# Patient Record
Sex: Female | Born: 1970
Health system: Southern US, Community
[De-identification: ages and names within clinical notes are randomized; demographics above are authoritative.]

## PROBLEM LIST (undated history)

## (undated) DIAGNOSIS — D649 Anemia, unspecified: Secondary | ICD-10-CM

## (undated) DIAGNOSIS — K219 Gastro-esophageal reflux disease without esophagitis: Secondary | ICD-10-CM

## (undated) DIAGNOSIS — E785 Hyperlipidemia, unspecified: Secondary | ICD-10-CM

## (undated) DIAGNOSIS — C801 Malignant (primary) neoplasm, unspecified: Secondary | ICD-10-CM

## (undated) DIAGNOSIS — N189 Chronic kidney disease, unspecified: Secondary | ICD-10-CM

## (undated) HISTORY — DX: Chronic kidney disease, unspecified: N18.9

## (undated) HISTORY — PX: HERNIA REPAIR: SHX51

## (undated) HISTORY — PX: TUBAL LIGATION: SHX77

## (undated) HISTORY — DX: Morbid (severe) obesity due to excess calories: E66.01

## (undated) HISTORY — DX: Hyperlipidemia, unspecified: E78.5

## (undated) HISTORY — PX: BREAST BIOPSY: SHX20

---

## 1999-11-14 ENCOUNTER — Emergency Department (HOSPITAL_COMMUNITY): Admission: EM | Admit: 1999-11-14 | Discharge: 1999-11-14 | Payer: Self-pay | Admitting: Emergency Medicine

## 1999-12-02 ENCOUNTER — Other Ambulatory Visit: Admission: RE | Admit: 1999-12-02 | Discharge: 1999-12-02 | Payer: Self-pay | Admitting: *Deleted

## 2001-03-09 ENCOUNTER — Emergency Department (HOSPITAL_COMMUNITY): Admission: EM | Admit: 2001-03-09 | Discharge: 2001-03-09 | Payer: Self-pay | Admitting: *Deleted

## 2006-03-02 ENCOUNTER — Encounter: Admission: RE | Admit: 2006-03-02 | Discharge: 2006-03-02 | Payer: Self-pay | Admitting: Family Medicine

## 2012-09-26 ENCOUNTER — Emergency Department (HOSPITAL_COMMUNITY)
Admission: EM | Admit: 2012-09-26 | Discharge: 2012-09-26 | Disposition: A | Discharge status: Home / Self Care | Payer: Self-pay | Attending: Emergency Medicine | Admitting: Emergency Medicine

## 2012-09-26 ENCOUNTER — Encounter (HOSPITAL_COMMUNITY): Payer: Self-pay

## 2012-09-26 DIAGNOSIS — W268XXA Contact with other sharp object(s), not elsewhere classified, initial encounter: Secondary | ICD-10-CM | POA: Insufficient documentation

## 2012-09-26 DIAGNOSIS — F17200 Nicotine dependence, unspecified, uncomplicated: Secondary | ICD-10-CM | POA: Insufficient documentation

## 2012-09-26 DIAGNOSIS — S01311A Laceration without foreign body of right ear, initial encounter: Secondary | ICD-10-CM

## 2012-09-26 DIAGNOSIS — Y929 Unspecified place or not applicable: Secondary | ICD-10-CM | POA: Insufficient documentation

## 2012-09-26 DIAGNOSIS — Y9389 Activity, other specified: Secondary | ICD-10-CM | POA: Insufficient documentation

## 2012-09-26 DIAGNOSIS — S01309A Unspecified open wound of unspecified ear, initial encounter: Secondary | ICD-10-CM | POA: Insufficient documentation

## 2012-09-26 MED ORDER — IBUPROFEN 800 MG PO TABS: 800.0000 mg | ORAL_TABLET | Freq: Three times a day (TID) | ORAL | Status: DC | PRN

## 2012-09-26 NOTE — ED Notes (Signed)
Pt c/o 0.5" laceration on R ear lobe.  Pain 3/10.  Bleeding has stopped.  NAD noted.

## 2012-09-26 NOTE — ED Provider Notes (Signed)
History     CSN: [id number]  Arrival date & time 09/26/12  1335   First MD Initiated Contact with Patient 09/26/12 1350      Chief Complaint  Patient presents with  . Ear Laceration    (Consider location/radiation/quality/duration/timing/severity/associated sxs/prior treatment) HPI [name] is a 42 year old female who presents to the ED with a right ear laceration. She reports wearing hoop earrings today. She was reaching down to get something and her earring got caught and her ear lobe ripped. She denies fever, chills, SOB chest pain, or N/V.   History reviewed. No pertinent past medical history.  History reviewed. No pertinent past surgical history.  History reviewed. No pertinent family history.  History  Substance Use Topics  . Smoking status: Current Every Day Smoker -- 0.50 packs/day    Types: Cigarettes  . Smokeless tobacco: Not on file  . Alcohol Use: Yes    OB History   Grav Para Term Preterm Abortions TAB SAB Ect Mult Living                  Review of Systems All other systems negative except as documented in the HPI. All pertinent positives and negatives as reviewed in the HPI.  Allergies  Review of patient's allergies indicates no known allergies.  Home Medications   Current Outpatient Rx  Name  Route  Sig  Dispense  Refill  . naproxen sodium (ANAPROX) 220 MG tablet   Oral   Take 660 mg by mouth once. pain           BP 153/83  Pulse 92  Temp(Src) 98.1 F (36.7 C) (Oral)  Resp 18  SpO2 99%  LMP 09/25/2012  Physical Exam  Constitutional: She appears well-developed and well-nourished. No distress.  HENT:  Head: Normocephalic and atraumatic.    Right Ear: Right ear exhibits lacerations. No drainage.  Ears:  Skin: Skin is warm and dry.    ED Course  Procedures (including critical care time)  LACERATION REPAIR Performed by: Carlyle Dolly Authorized by: Carlyle Dolly Consent: Verbal consent obtained. Risks and  benefits: risks, benefits and alternatives were discussed Consent given by: patient Patient identity confirmed: provided demographic data Prepped and Draped in normal sterile fashion Wound explored  Laceration Location: Right ear  Laceration Length: 4 cm  No Foreign Bodies seen or palpated  Anesthesia: local infiltration  Local anesthetic: lidocaine 2 % without epinephrine  Anesthetic total: 6 ml  Irrigation method: syringe Amount of cleaning: standard  Skin closure: 7-0 Prolene   Number of sutures: 7   Technique: Simple, and a relative, and one corner stitch   Patient tolerance: Patient tolerated the procedure well with no immediate complications.    Patient is advised to keep the area clean and dry.  Told to have sutures out in 7 days.  Return here for any signs of infection. MDM         Carlyle Dolly, PA-C 09/28/12 2102

## 2012-09-26 NOTE — ED Notes (Signed)
Pt escorted to discharge window. Verbalized understanding discharge instructions. In no acute distress.   

## 2012-09-30 NOTE — ED Provider Notes (Signed)
Medical screening examination/treatment/procedure(s) were performed by non-physician practitioner and as supervising physician I was immediately available for consultation/collaboration.   Ennifer Harston L Katrinia Straker, MD 09/30/12 1120 

## 2013-04-18 ENCOUNTER — Encounter (HOSPITAL_COMMUNITY): Payer: Self-pay | Admitting: Emergency Medicine

## 2013-04-18 ENCOUNTER — Emergency Department (INDEPENDENT_AMBULATORY_CARE_PROVIDER_SITE_OTHER)
Admission: EM | Admit: 2013-04-18 | Discharge: 2013-04-18 | Disposition: A | Discharge status: Home / Self Care | Payer: No Typology Code available for payment source | Source: Home / Self Care | Attending: Family Medicine | Admitting: Family Medicine

## 2013-04-18 DIAGNOSIS — N390 Urinary tract infection, site not specified: Secondary | ICD-10-CM

## 2013-04-18 LAB — POCT URINALYSIS DIP (DEVICE)
Glucose, UA: NEGATIVE mg/dL
Nitrite: NEGATIVE
Protein, ur: >=300 mg/dL — AB
Specific Gravity, Urine: >=1.03 (ref 1.005–1.030)
Urobilinogen, UA: 1 mg/dL (ref 0.0–1.0)
pH: 5.5 (ref 5.0–8.0)

## 2013-04-18 MED ORDER — CEPHALEXIN 500 MG PO CAPS: 500.0000 mg | ORAL_CAPSULE | Freq: Four times a day (QID) | ORAL | Status: DC

## 2013-04-18 NOTE — ED Provider Notes (Signed)
CSN: [id number]     Arrival date & time 04/18/13  1435 History   First MD Initiated Contact with Patient 04/18/13 1633     Chief Complaint  Patient presents with  . Dysuria   (Consider location/radiation/quality/duration/timing/severity/associated sxs/prior Treatment) Patient is a 42 y.o. female presenting with dysuria. The history is provided by the patient.  Dysuria Pain quality:  Shooting Pain severity:  Moderate Onset quality:  Gradual Duration:  3 days Progression:  Worsening Chronicity:  New Recent urinary tract infections: no   Relieved by:  None tried Urinary symptoms: frequent urination, hematuria and hesitancy   Associated symptoms: no abdominal pain, no fever, no flank pain, no nausea, no vaginal discharge and no vomiting   Risk factors: no recurrent urinary tract infections     History reviewed. No pertinent past medical history. History reviewed. No pertinent past surgical history. History reviewed. No pertinent family history. History  Substance Use Topics  . Smoking status: Current Every Day Smoker -- 0.50 packs/day    Types: Cigarettes  . Smokeless tobacco: Not on file  . Alcohol Use: Yes   OB History   Grav Para Term Preterm Abortions TAB SAB Ect Mult Living                 Review of Systems  Constitutional: Negative.  Negative for fever.  Gastrointestinal: Negative.  Negative for nausea, vomiting and abdominal pain.  Genitourinary: Positive for dysuria, urgency and frequency. Negative for flank pain, vaginal discharge and pelvic pain.    Allergies  Review of patient's allergies indicates no known allergies.  Home Medications   Current Outpatient Rx  Name  Route  Sig  Dispense  Refill  . cephALEXin (KEFLEX) 500 MG capsule   Oral   Take 1 capsule (500 mg total) by mouth 4 (four) times daily. Take all of medicine and drink lots of fluids   20 capsule   0   . ibuprofen (ADVIL,MOTRIN) 800 MG tablet   Oral   Take 1 tablet (800 mg total) by mouth  every 8 (eight) hours as needed for pain.   21 tablet   0   . naproxen sodium (ANAPROX) 220 MG tablet   Oral   Take 660 mg by mouth once. pain          BP 138/97  Pulse 93  Temp(Src) 98.3 F (36.8 C) (Oral)  Resp 20  SpO2 98%  LMP 04/14/2013 Physical Exam  Nursing note and vitals reviewed. Constitutional: She is oriented to person, place, and time. She appears well-developed and well-nourished.  Abdominal: Soft. Bowel sounds are normal. She exhibits no mass. There is no tenderness. There is no rebound and no guarding.  Musculoskeletal: Normal range of motion.  Neurological: She is alert and oriented to person, place, and time.  Skin: Skin is warm and dry.    ED Course  Procedures (including critical care time) Labs Review Labs Reviewed  POCT URINALYSIS DIP (DEVICE) - Abnormal; Notable for the following:    Bilirubin Urine SMALL (*)    Ketones, ur TRACE (*)    Hgb urine dipstick LARGE (*)    Protein, ur >=300 (*)    Leukocytes, UA TRACE (*)    All other components within normal limits   Imaging Review No results found.    MDM      Linna Hoff, MD 04/18/13 [phone]

## 2013-04-18 NOTE — ED Notes (Signed)
C/o pain when urinating for a week now.

## 2013-04-19 LAB — POCT PREGNANCY, URINE: Preg Test, Ur: NEGATIVE

## 2013-06-29 DIAGNOSIS — C189 Malignant neoplasm of colon, unspecified: Secondary | ICD-10-CM

## 2013-06-29 HISTORY — DX: Malignant neoplasm of colon, unspecified: C18.9

## 2013-07-25 ENCOUNTER — Emergency Department (INDEPENDENT_AMBULATORY_CARE_PROVIDER_SITE_OTHER)
Admission: EM | Admit: 2013-07-25 | Discharge: 2013-07-25 | Disposition: A | Discharge status: Home / Self Care | Payer: No Typology Code available for payment source | Source: Home / Self Care | Attending: Family Medicine | Admitting: Family Medicine

## 2013-07-25 ENCOUNTER — Encounter (HOSPITAL_COMMUNITY): Payer: Self-pay | Admitting: Emergency Medicine

## 2013-07-25 DIAGNOSIS — N390 Urinary tract infection, site not specified: Secondary | ICD-10-CM

## 2013-07-25 LAB — POCT URINALYSIS DIP (DEVICE)
Bilirubin Urine: NEGATIVE
Glucose, UA: NEGATIVE mg/dL
Ketones, ur: NEGATIVE mg/dL
Nitrite: NEGATIVE
Protein, ur: 100 mg/dL — AB
Specific Gravity, Urine: 1.025 (ref 1.005–1.030)
Urobilinogen, UA: 1 mg/dL (ref 0.0–1.0)
pH: 6.5 (ref 5.0–8.0)

## 2013-07-25 LAB — POCT PREGNANCY, URINE: Preg Test, Ur: NEGATIVE

## 2013-07-25 MED ORDER — CEPHALEXIN 500 MG PO CAPS: 500.0000 mg | ORAL_CAPSULE | Freq: Four times a day (QID) | ORAL | Status: DC

## 2013-07-25 NOTE — Discharge Instructions (Signed)
Take all of medicine as directed, drink lots of fluids, see your doctor if further problems. °

## 2013-07-25 NOTE — ED Provider Notes (Signed)
CSN: [id number]     Arrival date & time 07/25/13  0093 History   First MD Initiated Contact with Patient 07/25/13 1020     Chief Complaint  Patient presents with  . Urinary Tract Infection   (Consider location/radiation/quality/duration/timing/severity/associated sxs/prior Treatment) Patient is a 43 y.o. female presenting with urinary tract infection. The history is provided by the patient.  Urinary Tract Infection This is a recurrent problem. The current episode started yesterday (similar to 10/14.). The problem has not changed since onset.Pertinent negatives include no abdominal pain.    History reviewed. No pertinent past medical history. History reviewed. No pertinent past surgical history. No family history on file. History  Substance Use Topics  . Smoking status: Current Every Day Smoker -- 0.50 packs/day    Types: Cigarettes  . Smokeless tobacco: Not on file  . Alcohol Use: Yes   OB History   Grav Para Term Preterm Abortions TAB SAB Ect Mult Living                 Review of Systems  Constitutional: Negative for fever and chills.  Gastrointestinal: Negative.  Negative for abdominal pain.  Genitourinary: Positive for dysuria, urgency and frequency. Negative for vaginal bleeding and vaginal discharge.    Allergies  Review of patient's allergies indicates no known allergies.  Home Medications   Current Outpatient Rx  Name  Route  Sig  Dispense  Refill  . cephALEXin (KEFLEX) 500 MG capsule   Oral   Take 1 capsule (500 mg total) by mouth 4 (four) times daily. Take all of medicine and drink lots of fluids   20 capsule   0   . cephALEXin (KEFLEX) 500 MG capsule   Oral   Take 1 capsule (500 mg total) by mouth 4 (four) times daily. Take all of medicine and drink lots of fluids   20 capsule   0   . ibuprofen (ADVIL,MOTRIN) 800 MG tablet   Oral   Take 1 tablet (800 mg total) by mouth every 8 (eight) hours as needed for pain.   21 tablet   0   . naproxen sodium  (ANAPROX) 220 MG tablet   Oral   Take 660 mg by mouth once. pain          BP 122/80  Pulse 97  Temp(Src) 98 F (36.7 C) (Oral)  Resp 16  SpO2 100%  LMP 07/20/2013 Physical Exam  Nursing note and vitals reviewed. Constitutional: She is oriented to person, place, and time. She appears well-developed and well-nourished.  Abdominal: Soft. Bowel sounds are normal.  Neurological: She is alert and oriented to person, place, and time.  Skin: Skin is warm and dry.    ED Course  Procedures (including critical care time) Labs Review Labs Reviewed  POCT URINALYSIS DIP (DEVICE) - Abnormal; Notable for the following:    Hgb urine dipstick LARGE (*)    Protein, ur 100 (*)    Leukocytes, UA LARGE (*)    All other components within normal limits  POCT PREGNANCY, URINE   Imaging Review No results found.  EKG Interpretation    Date/Time:    Ventricular Rate:    PR Interval:    QRS Duration:   QT Interval:    QTC Calculation:   R Axis:     Text Interpretation:              MDM  U/a abnl.   Billy Fischer, MD 07/25/13 1043

## 2013-07-25 NOTE — ED Notes (Signed)
Pt c/o poss UTI onset yest Sxs include: abd/back pain, dysuria, urinary frequency/urgency Denies: hematuria, f/v/n/d, gyn sxs Alert w/no signs of acute distress.

## 2013-07-29 ENCOUNTER — Encounter (HOSPITAL_COMMUNITY): Payer: Self-pay | Admitting: Emergency Medicine

## 2013-07-29 ENCOUNTER — Emergency Department (HOSPITAL_COMMUNITY)
Admission: EM | Admit: 2013-07-29 | Discharge: 2013-07-29 | Disposition: A | Discharge status: Home / Self Care | Payer: No Typology Code available for payment source | Attending: Emergency Medicine | Admitting: Emergency Medicine

## 2013-07-29 DIAGNOSIS — Z792 Long term (current) use of antibiotics: Secondary | ICD-10-CM | POA: Insufficient documentation

## 2013-07-29 DIAGNOSIS — N12 Tubulo-interstitial nephritis, not specified as acute or chronic: Secondary | ICD-10-CM | POA: Insufficient documentation

## 2013-07-29 DIAGNOSIS — F17200 Nicotine dependence, unspecified, uncomplicated: Secondary | ICD-10-CM | POA: Insufficient documentation

## 2013-07-29 LAB — URINALYSIS, ROUTINE W REFLEX MICROSCOPIC
Glucose, UA: NEGATIVE mg/dL
Ketones, ur: NEGATIVE mg/dL
Nitrite: NEGATIVE
Protein, ur: 100 mg/dL — AB
Specific Gravity, Urine: 1.03 (ref 1.005–1.030)
Urobilinogen, UA: 1 mg/dL (ref 0.0–1.0)
pH: 6 (ref 5.0–8.0)

## 2013-07-29 LAB — URINE MICROSCOPIC-ADD ON

## 2013-07-29 MED ORDER — PHENAZOPYRIDINE HCL 200 MG PO TABS: 200.0000 mg | ORAL_TABLET | Freq: Three times a day (TID) | ORAL | Status: DC

## 2013-07-29 MED ORDER — PROMETHAZINE HCL 25 MG PO TABS: 25.0000 mg | ORAL_TABLET | Freq: Four times a day (QID) | ORAL | Status: DC | PRN

## 2013-07-29 MED ORDER — SULFAMETHOXAZOLE-TMP DS 800-160 MG PO TABS: 1.0000 | ORAL_TABLET | Freq: Two times a day (BID) | ORAL | Status: DC

## 2013-07-29 NOTE — ED Provider Notes (Signed)
CSN: [id number]     Arrival date & time 07/29/13  6834 History   First MD Initiated Contact with Patient 07/29/13 [phone]     Chief Complaint  Patient presents with  . Abdominal Pain   (Consider location/radiation/quality/duration/timing/severity/associated sxs/prior Treatment) HPI Comments: Pt comes in with cc of flank pain.  Pt started having dysuria and right sided + suprapubic abd pain on Monday, and was [facility] - she was started on keflex. She has been taking her meds as prescribed, but her sx have not responded, and she now has left flank pain with continued bladder spasms. She has no n/v/f/c. Pt has hematuria. No vag discharge, bleeding, and she is not on her period. No hx of std, and no risk factors for the same.  Patient is a 43 y.o. female presenting with abdominal pain. The history is provided by the patient.  Abdominal Pain Associated symptoms: dysuria   Associated symptoms: no chills, no fever, no nausea, no vaginal bleeding, no vaginal discharge and no vomiting     History reviewed. No pertinent past medical history. History reviewed. No pertinent past surgical history. History reviewed. No pertinent family history. History  Substance Use Topics  . Smoking status: Current Every Day Smoker -- 0.50 packs/day    Types: Cigarettes  . Smokeless tobacco: Not on file  . Alcohol Use: Yes   OB History   Grav Para Term Preterm Abortions TAB SAB Ect Mult Living                 Review of Systems  Constitutional: Negative for fever and chills.  Gastrointestinal: Positive for abdominal pain. Negative for nausea and vomiting.  Genitourinary: Positive for dysuria, frequency and flank pain. Negative for vaginal bleeding and vaginal discharge.  Skin: Negative for rash and wound.  Allergic/Immunologic: Negative for immunocompromised state.  Neurological: Negative for weakness.    Allergies  Review of patient's allergies indicates no known allergies.  Home  Medications   Current Outpatient Rx  Name  Route  Sig  Dispense  Refill  . cephALEXin (KEFLEX) 500 MG capsule   Oral   Take 1 capsule (500 mg total) by mouth 4 (four) times daily. Take all of medicine and drink lots of fluids   20 capsule   0   . naproxen sodium (ANAPROX) 220 MG tablet   Oral   Take 660 mg by mouth as needed. pain          BP 136/82  Pulse 97  Temp(Src) 98.6 F (37 C) (Oral)  Resp 14  Ht 5\' 1"  (1.549 m)  Wt 270 lb (122.471 kg)  BMI 51.04 kg/m2  SpO2 97%  LMP 07/20/2013 Physical Exam  Nursing note and vitals reviewed. Constitutional: She is oriented to person, place, and time. She appears well-developed and well-nourished.  HENT:  Head: Normocephalic and atraumatic.  Eyes: EOM are normal. Pupils are equal, round, and reactive to light.  Neck: Neck supple.  Cardiovascular: Normal rate, regular rhythm and normal heart sounds.   No murmur heard. Pulmonary/Chest: Effort normal. No respiratory distress.  Abdominal: Soft. She exhibits no distension. There is tenderness. There is no rebound and no guarding.  Left flank tenderness and suprapubic tenderness  Neurological: She is alert and oriented to person, place, and time.  Skin: Skin is warm and dry.    ED Course  Procedures (including critical care time) Labs Review Labs Reviewed  URINE CULTURE  URINALYSIS, ROUTINE W REFLEX MICROSCOPIC   Imaging Review  No results found.  EKG Interpretation   None       MDM  No diagnosis found.  Pt comes in with cc of dysuria, hematuria, suprapubic pain and right flank pain. Clinically has pyelonephritis. She is on keflex already - will add bactrim for improved coverage and get urine cultures. She has no nausea/emesis/fevers/chills  - but return precautions have been discussed.   Varney Biles, MD 07/29/13 1200

## 2013-07-29 NOTE — Discharge Instructions (Signed)
Your urine still shows infection. If your symptoms get worse, return to the ER. Take the medicine provided, and as prescribed. Finished the antibiotics already prescribed.   Pyelonephritis, Adult Pyelonephritis is a kidney infection. In general, there are 2 main types of pyelonephritis:  Infections that come on quickly without any warning (acute pyelonephritis).  Infections that persist for a long period of time (chronic pyelonephritis). CAUSES  Two main causes of pyelonephritis are:  Bacteria traveling from the bladder to the kidney. This is a problem especially in pregnant women. The urine in the bladder can become filled with bacteria from multiple causes, including:  Inflammation of the prostate gland (prostatitis).  Sexual intercourse in females.  Bladder infection (cystitis).  Bacteria traveling from the bloodstream to the tissue part of the kidney. Problems that may increase your risk of getting a kidney infection include:  Diabetes.  Kidney stones or bladder stones.  Cancer.  Catheters placed in the bladder.  Other abnormalities of the kidney or ureter. SYMPTOMS   Abdominal pain.  Pain in the side or flank area.  Fever.  Chills.  Upset stomach.  Blood in the urine (dark urine).  Frequent urination.  Strong or persistent urge to urinate.  Burning or stinging when urinating. DIAGNOSIS  Your caregiver may diagnose your kidney infection based on your symptoms. A urine sample may also be taken. TREATMENT  In general, treatment depends on how severe the infection is.   If the infection is mild and caught early, your caregiver may treat you with oral antibiotics and send you home.  If the infection is more severe, the bacteria may have gotten into the bloodstream. This will require intravenous (IV) antibiotics and a hospital stay. Symptoms may include:  High fever.  Severe flank pain.  Shaking chills.  Even after a hospital stay, your caregiver  may require you to be on oral antibiotics for a period of time.  Other treatments may be required depending upon the cause of the infection. HOME CARE INSTRUCTIONS   Take your antibiotics as directed. Finish them even if you start to feel better.  Make an appointment to have your urine checked to make sure the infection is gone.  Drink enough fluids to keep your urine clear or pale yellow.  Take medicines for the bladder if you have urgency and frequency of urination as directed by your caregiver. SEEK IMMEDIATE MEDICAL CARE IF:   You have a fever or persistent symptoms for more than 2-3 days.  You have a fever and your symptoms suddenly get worse.  You are unable to take your antibiotics or fluids.  You develop shaking chills.  You experience extreme weakness or fainting.  There is no improvement after 2 days of treatment. MAKE SURE YOU:  Understand these instructions.  Will watch your condition.  Will get help right away if you are not doing well or get worse. Document Released: 06/15/2005 Document Revised: 12/15/2011 Document Reviewed: 11/19/2010 [facility] Patient Information 2014 [place], [place].

## 2013-07-29 NOTE — ED Notes (Signed)
She states she was seen and treated this Mon. For u.t.i./prescribed Cipro.  She states that, the antibiotic notwithstanding, her low abd. Pain is worsening.  She is in no distress.

## 2013-07-31 LAB — URINE CULTURE

## 2013-08-07 ENCOUNTER — Inpatient Hospital Stay (HOSPITAL_COMMUNITY)
Admission: EM | Admit: 2013-08-07 | Discharge: 2013-08-08 | DRG: 690 | Disposition: A | Discharge status: Home / Self Care | Payer: No Typology Code available for payment source | Attending: Internal Medicine | Admitting: Internal Medicine

## 2013-08-07 ENCOUNTER — Emergency Department (HOSPITAL_COMMUNITY): Payer: No Typology Code available for payment source

## 2013-08-07 ENCOUNTER — Encounter (HOSPITAL_COMMUNITY): Payer: Self-pay | Admitting: Emergency Medicine

## 2013-08-07 DIAGNOSIS — D649 Anemia, unspecified: Secondary | ICD-10-CM | POA: Diagnosis present

## 2013-08-07 DIAGNOSIS — R12 Heartburn: Secondary | ICD-10-CM | POA: Diagnosis present

## 2013-08-07 DIAGNOSIS — K7689 Other specified diseases of liver: Secondary | ICD-10-CM | POA: Diagnosis present

## 2013-08-07 DIAGNOSIS — E876 Hypokalemia: Secondary | ICD-10-CM | POA: Diagnosis present

## 2013-08-07 DIAGNOSIS — N390 Urinary tract infection, site not specified: Principal | ICD-10-CM | POA: Diagnosis present

## 2013-08-07 DIAGNOSIS — R7401 Elevation of levels of liver transaminase levels: Secondary | ICD-10-CM | POA: Diagnosis present

## 2013-08-07 DIAGNOSIS — F17200 Nicotine dependence, unspecified, uncomplicated: Secondary | ICD-10-CM | POA: Diagnosis present

## 2013-08-07 DIAGNOSIS — R4182 Altered mental status, unspecified: Secondary | ICD-10-CM | POA: Diagnosis present

## 2013-08-07 DIAGNOSIS — R740 Nonspecific elevation of levels of transaminase and lactic acid dehydrogenase [LDH]: Secondary | ICD-10-CM

## 2013-08-07 DIAGNOSIS — Z6841 Body Mass Index (BMI) 40.0 and over, adult: Secondary | ICD-10-CM

## 2013-08-07 DIAGNOSIS — R7402 Elevation of levels of lactic acid dehydrogenase (LDH): Secondary | ICD-10-CM | POA: Diagnosis present

## 2013-08-07 LAB — COMPREHENSIVE METABOLIC PANEL
ALT: 30 U/L (ref 0–35)
ALT: 30 U/L (ref 0–35)
AST: 38 U/L — ABNORMAL HIGH (ref 0–37)
AST: 40 U/L — ABNORMAL HIGH (ref 0–37)
Albumin: 3.3 g/dL — ABNORMAL LOW (ref 3.5–5.2)
Albumin: 2.9 g/dL — ABNORMAL LOW (ref 3.5–5.2)
Alkaline Phosphatase: 96 U/L (ref 39–117)
Alkaline Phosphatase: 85 U/L (ref 39–117)
BUN: 12 mg/dL (ref 6–23)
BUN: 8 mg/dL (ref 6–23)
CO2: 25 mEq/L (ref 19–32)
CO2: 22 mEq/L (ref 19–32)
Calcium: 8.6 mg/dL (ref 8.4–10.5)
Calcium: 7.9 mg/dL — ABNORMAL LOW (ref 8.4–10.5)
Chloride: 94 mEq/L — ABNORMAL LOW (ref 96–112)
Chloride: 100 mEq/L (ref 96–112)
Creatinine, Ser: 1.03 mg/dL (ref 0.50–1.10)
Creatinine, Ser: 0.82 mg/dL (ref 0.50–1.10)
GFR calc Af Amer: 78 mL/min — ABNORMAL LOW (ref >90)
GFR calc Af Amer: >90 mL/min (ref >90)
GFR calc non Af Amer: 67 mL/min — ABNORMAL LOW (ref >90)
GFR calc non Af Amer: 88 mL/min — ABNORMAL LOW (ref >90)
Glucose, Bld: 105 mg/dL — ABNORMAL HIGH (ref 70–99)
Glucose, Bld: 106 mg/dL — ABNORMAL HIGH (ref 70–99)
Potassium: 3.6 mEq/L — ABNORMAL LOW (ref 3.7–5.3)
Potassium: 3.6 mEq/L — ABNORMAL LOW (ref 3.7–5.3)
Sodium: 133 mEq/L — ABNORMAL LOW (ref 137–147)
Sodium: 135 mEq/L — ABNORMAL LOW (ref 137–147)
Total Bilirubin: <0.2 mg/dL — ABNORMAL LOW (ref 0.3–1.2)
Total Bilirubin: <0.2 mg/dL — ABNORMAL LOW (ref 0.3–1.2)
Total Protein: 7.3 g/dL (ref 6.0–8.3)
Total Protein: 6.4 g/dL (ref 6.0–8.3)

## 2013-08-07 LAB — CBC WITH DIFFERENTIAL/PLATELET
Basophils Absolute: 0 10*3/uL (ref 0.0–0.1)
Basophils Absolute: 0 10*3/uL (ref 0.0–0.1)
Basophils Relative: 0 % (ref 0–1)
Basophils Relative: 1 % (ref 0–1)
Eosinophils Absolute: 0.1 10*3/uL (ref 0.0–0.7)
Eosinophils Absolute: 0.1 10*3/uL (ref 0.0–0.7)
Eosinophils Relative: 2 % (ref 0–5)
Eosinophils Relative: 3 % (ref 0–5)
HCT: 32.5 % — ABNORMAL LOW (ref 36.0–46.0)
HCT: 28.5 % — ABNORMAL LOW (ref 36.0–46.0)
Hemoglobin: 10.4 g/dL — ABNORMAL LOW (ref 12.0–15.0)
Hemoglobin: 9 g/dL — ABNORMAL LOW (ref 12.0–15.0)
Lymphocytes Relative: 11 % — ABNORMAL LOW (ref 12–46)
Lymphocytes Relative: 16 % (ref 12–46)
Lymphs Abs: 0.6 10*3/uL — ABNORMAL LOW (ref 0.7–4.0)
Lymphs Abs: 0.7 10*3/uL (ref 0.7–4.0)
MCH: 22.2 pg — ABNORMAL LOW (ref 26.0–34.0)
MCH: 21.8 pg — ABNORMAL LOW (ref 26.0–34.0)
MCHC: 32 g/dL (ref 30.0–36.0)
MCHC: 31.6 g/dL (ref 30.0–36.0)
MCV: 69.3 fL — ABNORMAL LOW (ref 78.0–100.0)
MCV: 69.2 fL — ABNORMAL LOW (ref 78.0–100.0)
Monocytes Absolute: 0.4 10*3/uL (ref 0.1–1.0)
Monocytes Absolute: 0.3 10*3/uL (ref 0.1–1.0)
Monocytes Relative: 7 % (ref 3–12)
Monocytes Relative: 7 % (ref 3–12)
Neutro Abs: 4 10*3/uL (ref 1.7–7.7)
Neutro Abs: 3.3 10*3/uL (ref 1.7–7.7)
Neutrophils Relative %: 80 % — ABNORMAL HIGH (ref 43–77)
Neutrophils Relative %: 73 % (ref 43–77)
Platelets: 237 10*3/uL (ref 150–400)
Platelets: 199 10*3/uL (ref 150–400)
RBC: 4.69 MIL/uL (ref 3.87–5.11)
RBC: 4.12 MIL/uL (ref 3.87–5.11)
RDW: 18.5 % — ABNORMAL HIGH (ref 11.5–15.5)
RDW: 18.5 % — ABNORMAL HIGH (ref 11.5–15.5)
WBC: 5.1 10*3/uL (ref 4.0–10.5)
WBC: 4.4 10*3/uL (ref 4.0–10.5)

## 2013-08-07 LAB — URINALYSIS, ROUTINE W REFLEX MICROSCOPIC
Bilirubin Urine: NEGATIVE
Glucose, UA: NEGATIVE mg/dL
Ketones, ur: 15 mg/dL — AB
Nitrite: NEGATIVE
Protein, ur: 100 mg/dL — AB
Specific Gravity, Urine: 1.03 (ref 1.005–1.030)
Urobilinogen, UA: 0.2 mg/dL (ref 0.0–1.0)
pH: 6.5 (ref 5.0–8.0)

## 2013-08-07 LAB — URINE MICROSCOPIC-ADD ON

## 2013-08-07 LAB — PROTIME-INR
INR: 1.04 (ref 0.00–1.49)
Prothrombin Time: 13.4 seconds (ref 11.6–15.2)

## 2013-08-07 LAB — APTT: aPTT: 29 seconds (ref 24–37)

## 2013-08-07 LAB — PHOSPHORUS: Phosphorus: 2.5 mg/dL (ref 2.3–4.6)

## 2013-08-07 LAB — TSH: TSH: 1.005 u[IU]/mL (ref 0.350–4.500)

## 2013-08-07 LAB — MAGNESIUM: Magnesium: 2.1 mg/dL (ref 1.5–2.5)

## 2013-08-07 LAB — LACTIC ACID, PLASMA: Lactic Acid, Venous: 0.9 mmol/L (ref 0.5–2.2)

## 2013-08-07 MED ORDER — ONDANSETRON HCL 4 MG/2ML IJ SOLN: 4.0000 mg | Freq: Four times a day (QID) | INTRAMUSCULAR | Status: DC | PRN

## 2013-08-07 MED ORDER — SODIUM CHLORIDE 0.9 % IV SOLN
1000.0000 mL | INTRAVENOUS | Status: DC
  Administered 2013-08-07: 1000 mL via INTRAVENOUS

## 2013-08-07 MED ORDER — SODIUM CHLORIDE 0.9 % IV SOLN
1000.0000 mL | Freq: Once | INTRAVENOUS | Status: AC
  Administered 2013-08-07: 1000 mL via INTRAVENOUS

## 2013-08-07 MED ORDER — PROMETHAZINE HCL 25 MG PO TABS: 25.0000 mg | ORAL_TABLET | Freq: Four times a day (QID) | ORAL | Status: DC | PRN

## 2013-08-07 MED ORDER — SODIUM CHLORIDE 0.9 % IV SOLN: INTRAVENOUS | Status: DC

## 2013-08-07 MED ORDER — ACETAMINOPHEN 650 MG RE SUPP: 650.0000 mg | Freq: Four times a day (QID) | RECTAL | Status: DC | PRN

## 2013-08-07 MED ORDER — ACETAMINOPHEN 325 MG PO TABS: 650.0000 mg | ORAL_TABLET | Freq: Four times a day (QID) | ORAL | Status: DC | PRN

## 2013-08-07 MED ORDER — ONDANSETRON HCL 4 MG PO TABS: 4.0000 mg | ORAL_TABLET | Freq: Four times a day (QID) | ORAL | Status: DC | PRN

## 2013-08-07 MED ORDER — DEXTROSE 5 % IV SOLN
1.0000 g | INTRAVENOUS | Status: DC
  Administered 2013-08-08: 1 g via INTRAVENOUS
  Filled 2013-08-07: qty 10

## 2013-08-07 MED ORDER — POTASSIUM CHLORIDE CRYS ER 20 MEQ PO TBCR
40.0000 meq | EXTENDED_RELEASE_TABLET | Freq: Once | ORAL | Status: AC
  Administered 2013-08-07: 40 meq via ORAL
  Filled 2013-08-07: qty 2

## 2013-08-07 MED ORDER — HYDROCODONE-ACETAMINOPHEN 5-325 MG PO TABS: 1.0000 | ORAL_TABLET | ORAL | Status: DC | PRN

## 2013-08-07 MED ORDER — PHENAZOPYRIDINE HCL 200 MG PO TABS
200.0000 mg | ORAL_TABLET | Freq: Three times a day (TID) | ORAL | Status: DC
  Administered 2013-08-07 – 2013-08-08 (×3): 200 mg via ORAL
  Filled 2013-08-07 (×4): qty 1

## 2013-08-07 MED ORDER — DEXTROSE 5 % IV SOLN
1.0000 g | Freq: Once | INTRAVENOUS | Status: AC
  Administered 2013-08-07: 1 g via INTRAVENOUS
  Filled 2013-08-07: qty 10

## 2013-08-07 MED ORDER — SODIUM CHLORIDE 0.9 % IV SOLN
INTRAVENOUS | Status: DC
  Administered 2013-08-08: 15:00:00 via INTRAVENOUS

## 2013-08-07 MED ORDER — MORPHINE SULFATE 2 MG/ML IJ SOLN: 1.0000 mg | INTRAMUSCULAR | Status: DC | PRN

## 2013-08-07 NOTE — ED Notes (Signed)
Pt started taking Bactrim for UTI on 1/31, then started taking phenergan for nausea d/t the antibiotic, pt states she took the antibiotic this morning w/ aleve, unsure of when she last took phenergan, pt having trouble completing sentences, confused, able to state where she is and name, denies weakness or pain. Tongue midline, smile symmetrical. Able to move all extremities w/o difficulty.

## 2013-08-07 NOTE — H&P (Signed)
[facility] History and Physical  Amy Bentley POE:[id number] DOB: [birth year] DOA: 08/07/2013  Referring physician: ER physician PCP: [name], MD   Chief Complaint: altered mental status  HPI:  43 year old female with no significant past medical history, recent treatment for UTI, with bactrim and then phenergan for nausea who presented to [facility] ED 08/07/2013 with confusion, altered mental status as reported by her family. Her family was not present at the time of this interview so history was mainly obtained from ED physician and somewhat from the pt. No reports of fever or chills. She reported lower abdominal pain but no nausea or vomiting. No fever or chills. No chest pain, no palpitations. No blood in stool or urine. In ED, vitals are stable. Blood work revealed hemoglobin of 10.4 and potassium of 3.6. CT head was unremarkable. CXR was unremarkable. She was found to UTI and was started on IV rocephin.  Assessment and Plan:  Active Problems:   Altered metabolic encephalopathy - likely due to UTI - started on rocephin 1 gm Q 24 hours IV - follow up urine culture results   UTI (lower urinary tract infection) - as mentioned above, follow up urine culture results - cont rocephin   Hypokalemia  -repleted - follow up BMP in am  Radiological Exams on Admission: Ct Head Wo Contrast 08/07/2013 IMPRESSION: No acute intracranial pathology.   Electronically Signed   By: Kathreen Devoid   On: 08/07/2013 10:21   Dg Chest Port 1 View 08/07/2013  IMPRESSION: No active disease.   Electronically Signed   By: Lahoma Crocker M.D.   On: 08/07/2013 10:13    Code Status: Full Family Communication: Pt at bedside Disposition Plan: Admit for further evaluation  Leisa Lenz, MD  [facility] Pager [phone]  Review of Systems:  Altered mental status, unable to obtain ROD  History reviewed. No pertinent past medical history. History reviewed. No pertinent past surgical  history. Social History:  reports that she has been smoking Cigarettes.  She has been smoking about 0.50 packs per day. She does not have any smokeless tobacco history on file. She reports that she drinks alcohol. She reports that she does not use illicit drugs.  No Known Allergies  Family History: unable to obtain history due to altered mental status.  Prior to Admission medications   Medication Sig Start Date End Date Taking? Authorizing Provider  naproxen sodium (ANAPROX) 220 MG tablet Take 660 mg by mouth as needed. pain   Yes Historical Provider, MD  phenazopyridine (PYRIDIUM) 200 MG tablet Take 1 tablet (200 mg total) by mouth 3 (three) times daily. 07/29/13  Yes Varney Biles, MD  promethazine (PHENERGAN) 25 MG tablet Take 1 tablet (25 mg total) by mouth every 6 (six) hours as needed for nausea. 07/29/13  Yes Varney Biles, MD  sulfamethoxazole-trimethoprim (BACTRIM DS) 800-160 MG per tablet Take 1 tablet by mouth 2 (two) times daily. 07/29/13  Yes Varney Biles, MD   Physical Exam: Filed Vitals:   08/07/13 0858 08/07/13 1156  BP: 115/69 109/69  Pulse: 77 98  Temp: 98.4 F (36.9 C)   TempSrc: Oral   Resp: 18 20  SpO2: 98% 99%    Physical Exam  Constitutional: Appears well-developed and well-nourished. No distress.  HENT: Normocephalic. External right and left ear normal.  Eyes: Conjunctivae and EOM are normal. PERRLA, no scleral icterus.  Neck: Normal ROM. Neck supple. No JVD. No tracheal deviation. No thyromegaly.  CVS: RRR, S1/S2 +, no murmurs, no gallops,  no carotid bruit.  Pulmonary: Effort and breath sounds normal, no stridor, rhonchi, wheezes, rales.  Abdominal: Soft. BS +,  no distension, tenderness, rebound or guarding.  Musculoskeletal: Normal range of motion. No edema and no tenderness.  Lymphadenopathy: No lymphadenopathy noted, cervical, inguinal. Neuro: Alert. Normal reflexes, muscle tone coordination. No cranial nerve deficit. Skin: Skin is warm and dry. No  rash noted. Not diaphoretic. No erythema. No pallor.   Labs on Admission:  Basic Metabolic Panel:  Recent Labs Lab 08/07/13 0945  NA 133*  K 3.6*  CL 94*  CO2 25  GLUCOSE 105*  BUN 12  CREATININE 1.03  CALCIUM 8.6   Liver Function Tests:  Recent Labs Lab 08/07/13 0945  AST 38*  ALT 30  ALKPHOS 96  BILITOT <0.2*  PROT 7.3  ALBUMIN 3.3*   No results found for this basename: LIPASE, AMYLASE,  in the last 168 hours No results found for this basename: AMMONIA,  in the last 168 hours CBC:  Recent Labs Lab 08/07/13 0945  WBC 5.1  NEUTROABS 4.0  HGB 10.4*  HCT 32.5*  MCV 69.3*  PLT 237   Cardiac Enzymes: No results found for this basename: CKTOTAL, CKMB, CKMBINDEX, TROPONINI,  in the last 168 hours BNP: No components found with this basename: POCBNP,  CBG: No results found for this basename: GLUCAP,  in the last 168 hours  If 7PM-7AM, please contact night-coverage [email or web address] Password [email or web address] 08/07/2013, 1:49 PM

## 2013-08-07 NOTE — ED Provider Notes (Signed)
CSN: [id number]     Arrival date & time 08/07/13  0845 History   First MD Initiated Contact with Patient 08/07/13 [id number]     Chief Complaint  Patient presents with  . Allergic Reaction  . Altered Mental Status    HPI  Patient presents with her husband who provides much of the history of present illness, given the patient's altered mental status. The patient herself denies pain, seems to states that she feels nauseous, generally unwell, confused, with an no pain, no fever, no urinary complaints. However, the patient has orientation only to self and place, cannot describe her recent medical events, and this is a level V caveat. The patient's husband states that approximately one week ago the patient was initially seen at urgent care for her urinary complaints, she was diagnosed with UTI. She was started on Keflex. 3 days ago the patient presented to our affiliated hospital with ongoing symptoms, was prescribing new antibiotics, Phenergan. Husband states the patient has been compliant with all medication and with her not taking much Phenergan secondary to drowsiness. Today the patient was found by her husband to be speaking nonsensically, acting appropriately, performing odd behavior. She did not complain of new symptoms, but with concern for this no atypical behavior he brought her here for evaluation.   History reviewed. No pertinent past medical history. History reviewed. No pertinent past surgical history. No family history on file. History  Substance Use Topics  . Smoking status: Current Every Day Smoker -- 0.50 packs/day    Types: Cigarettes  . Smokeless tobacco: Not on file  . Alcohol Use: Yes   OB History   Grav Para Term Preterm Abortions TAB SAB Ect Mult Living                 Review of Systems  Unable to perform ROS: Mental status change    Allergies  Review of patient's allergies indicates no known allergies.  Home Medications   Current Outpatient Rx  Name  Route   Sig  Dispense  Refill  . cephALEXin (KEFLEX) 500 MG capsule   Oral   Take 1 capsule (500 mg total) by mouth 4 (four) times daily. Take all of medicine and drink lots of fluids   20 capsule   0   . naproxen sodium (ANAPROX) 220 MG tablet   Oral   Take 660 mg by mouth as needed. pain         . phenazopyridine (PYRIDIUM) 200 MG tablet   Oral   Take 1 tablet (200 mg total) by mouth 3 (three) times daily.   6 tablet   0   . promethazine (PHENERGAN) 25 MG tablet   Oral   Take 1 tablet (25 mg total) by mouth every 6 (six) hours as needed for nausea.   30 tablet   0   . sulfamethoxazole-trimethoprim (BACTRIM DS) 800-160 MG per tablet   Oral   Take 1 tablet by mouth 2 (two) times daily.   24 tablet   0    BP 115/69  Pulse 77  Temp(Src) 98.4 F (36.9 C) (Oral)  Resp 18  SpO2 98%  LMP 07/20/2013 Physical Exam  Nursing note and vitals reviewed. Constitutional: She is oriented to person, place, and time. She appears well-developed and well-nourished. No distress.  HENT:  Head: Normocephalic and atraumatic.  Eyes: Conjunctivae and EOM are normal.  Cardiovascular: Normal rate and regular rhythm.   Pulmonary/Chest: Effort normal and breath sounds normal. No stridor. No respiratory  distress.  Abdominal: She exhibits no distension. There is tenderness in the suprapubic area. There is no rigidity, no rebound and no guarding.  Musculoskeletal: She exhibits no edema.  Neurological: She is alert and oriented to person, place, and time. She displays no tremor. No cranial nerve deficit or sensory deficit. She displays no seizure activity. Coordination abnormal.  Patient moves all extremities spontaneously, has 5/5 strength in upper and lower extremities throughout. However, the patient does not complete cerebellar testing, and does not appropriately perform strength testing reliably in any extremity. Patient has no facial asymmetry, speech is clear, but she is oriented only to self and  place.   Skin: Skin is warm and dry.  Psychiatric: She has a normal mood and affect.    ED Course  Procedures (including critical care time) Labs Review Labs Reviewed  COMPREHENSIVE METABOLIC PANEL - Abnormal; Notable for the following:    Sodium 133 (*)    Potassium 3.6 (*)    Chloride 94 (*)    Glucose, Bld 105 (*)    Albumin 3.3 (*)    AST 38 (*)    Total Bilirubin <0.2 (*)    GFR calc non Af Amer 67 (*)    GFR calc Af Amer 78 (*)    All other components within normal limits  URINALYSIS, ROUTINE W REFLEX MICROSCOPIC - Abnormal; Notable for the following:    APPearance CLOUDY (*)    Hgb urine dipstick LARGE (*)    Ketones, ur 15 (*)    Protein, ur 100 (*)    Leukocytes, UA MODERATE (*)    All other components within normal limits  CBC WITH DIFFERENTIAL - Abnormal; Notable for the following:    Hemoglobin 10.4 (*)    HCT 32.5 (*)    MCV 69.3 (*)    MCH 22.2 (*)    RDW 18.5 (*)    Neutrophils Relative % 80 (*)    Lymphocytes Relative 11 (*)    Lymphs Abs 0.6 (*)    All other components within normal limits  URINE MICROSCOPIC-ADD ON - Abnormal; Notable for the following:    Bacteria, UA MANY (*)    All other components within normal limits  URINE CULTURE  LACTIC ACID, PLASMA  PROTIME-INR   Imaging Review No results found.  EKG Interpretation    Date/Time:  Monday August 07 2013 10:17:14 EST Ventricular Rate:  88 PR Interval:  127 QRS Duration: 79 QT Interval:  369 QTC Calculation: 446 R Axis:   69 Text Interpretation:  Sinus rhythm Borderline T wave abnormalities Sinus rhythm T wave abnormality Abnormal ekg Confirmed by Carmin Muskrat  MD ([id number]) on 08/07/2013 10:19:47 AM           During the initial eval I reviewed the patient's chart with her and her husband   Update: I reviewed exam the patient is in similar condition, altered, though hemodynamically stable. MDM   1. Altered mental status     Patient presents with altered mental status.   On exam the patient is hemodynamically stable, though inconsistent with his speech, disoriented to date.  With the patient's ongoing evidence of urinary tract infection, there is some suspicion for infection with delirium either secondary to the infection itself or medication.  Patient has failed therapy with 2 different oral antibiotics.  Patient will be admitted for further evaluation, management, and initiation of IV antibiotics.  Carmin Muskrat, MD 08/07/13 1623

## 2013-08-07 NOTE — ED Notes (Signed)
Assisted pt to bathroom, did not understand to urinate in cup, will do I/O cath.

## 2013-08-07 NOTE — Progress Notes (Addendum)
[facility] ED CM noted pt without pcp listed and coventry coverage CM spoke with pt to attempt to find name of pcp Pt states it is a female pcp but frequently states she "do not remember" Pt unable to tell CM the day nor month even after repeatedly being asked by CM. Pt does not know the location of pcp. CM noted in chart [facility]iewe [facility] UC provider, [facility] has seen pt.  Has 2 bottles on her bedside table Each Pill bottle (phenergan, septra) lists EDP, [facility] as prescriber.  Female at pt's bedside sleeping and unable to assist pt in her thought processing

## 2013-08-07 NOTE — ED Notes (Signed)
Patient went to restroom. Said she was unable to give UA sample. Patient was walked back to the room (3). She walked past her room and went to another room (2) and laid on stretcher. Husband came out looking for her.

## 2013-08-08 LAB — COMPREHENSIVE METABOLIC PANEL
ALT: 37 U/L — ABNORMAL HIGH (ref 0–35)
AST: 45 U/L — ABNORMAL HIGH (ref 0–37)
Albumin: 3.1 g/dL — ABNORMAL LOW (ref 3.5–5.2)
Alkaline Phosphatase: 92 U/L (ref 39–117)
BUN: 7 mg/dL (ref 6–23)
CO2: 22 mEq/L (ref 19–32)
Calcium: 8.3 mg/dL — ABNORMAL LOW (ref 8.4–10.5)
Chloride: 101 mEq/L (ref 96–112)
Creatinine, Ser: 0.85 mg/dL (ref 0.50–1.10)
GFR calc Af Amer: >90 mL/min (ref >90)
GFR calc non Af Amer: 85 mL/min — ABNORMAL LOW (ref >90)
Glucose, Bld: 101 mg/dL — ABNORMAL HIGH (ref 70–99)
Potassium: 3.9 mEq/L (ref 3.7–5.3)
Sodium: 136 mEq/L — ABNORMAL LOW (ref 137–147)
Total Bilirubin: 0.2 mg/dL — ABNORMAL LOW (ref 0.3–1.2)
Total Protein: 7 g/dL (ref 6.0–8.3)

## 2013-08-08 LAB — CBC
HCT: 31.6 % — ABNORMAL LOW (ref 36.0–46.0)
Hemoglobin: 9.9 g/dL — ABNORMAL LOW (ref 12.0–15.0)
MCH: 22 pg — ABNORMAL LOW (ref 26.0–34.0)
MCHC: 31.3 g/dL (ref 30.0–36.0)
MCV: 70.2 fL — ABNORMAL LOW (ref 78.0–100.0)
Platelets: 230 10*3/uL (ref 150–400)
RBC: 4.5 MIL/uL (ref 3.87–5.11)
RDW: 18.6 % — ABNORMAL HIGH (ref 11.5–15.5)
WBC: 4.6 10*3/uL (ref 4.0–10.5)

## 2013-08-08 LAB — PREGNANCY, URINE: Preg Test, Ur: NEGATIVE

## 2013-08-08 LAB — GLUCOSE, CAPILLARY: Glucose-Capillary: 107 mg/dL — ABNORMAL HIGH (ref 70–99)

## 2013-08-08 LAB — URINE CULTURE
Colony Count: NO GROWTH
Culture: NO GROWTH

## 2013-08-08 MED ORDER — PANTOPRAZOLE SODIUM 40 MG PO TBEC
40.0000 mg | DELAYED_RELEASE_TABLET | Freq: Every day | ORAL | Status: DC
  Administered 2013-08-08: 40 mg via ORAL
  Filled 2013-08-08: qty 1

## 2013-08-08 MED ORDER — ALUM & MAG HYDROXIDE-SIMETH 200-200-20 MG/5ML PO SUSP
30.0000 mL | Freq: Four times a day (QID) | ORAL | Status: DC | PRN
  Administered 2013-08-08: 30 mL via ORAL
  Filled 2013-08-08: qty 30

## 2013-08-08 NOTE — Progress Notes (Signed)
Patient has been ambulating in the hallway with spouse, tolerating well.  Drinking fluids and hopes to go home in the am

## 2013-08-08 NOTE — Discharge Summary (Signed)
Physician Discharge Summary  Amy Bentley HWE:[id number] DOB: 01-11-1971 DOA: 08/07/2013  PCP: [name], MD  Admit date: 08/07/2013 Discharge date: 08/08/2013  Time spent: Less than 30 minutes  Recommendations for Outpatient Follow-up:  1. Dr. Susa Day, PCP in 3 days with repeat labs (CBC & CMP)  Discharge Diagnoses:  Principal Problem:   Altered mental status Active Problems:   UTI (lower urinary tract infection)   Hypokalemia   Anemia   Discharge Condition: Improved & Stable  Diet recommendation: Heart healthy diet.  Filed Weights   08/07/13 1958  Weight: 114.9 kg (253 lb 4.9 oz)    History of present illness:  43 year old female patient with no significant past medical history, recently treated for UTI with Bactrim &? Cipro, admitted on 08/07/13 with confusion. In ED, vitals are stable. Blood work revealed hemoglobin of 10.4 and potassium of 3.6. CT head was unremarkable. CXR was unremarkable. UA was suggestive of UTI.   Hospital Course:   She was admitted to the hospital and started empirically on IV ciprofloxacin pending urine culture results. Her altered mental status was transient and within 12 hours, she seemed to have cleared completely. CT head and chest x-ray were negative for acute findings. Metabolic workup was unremarkable except for minimally elevated transaminases which can be followed up as outpatient. She complains of some intermittent heartburn that she's had for years for which she takes OTC Prilosec. She denies alcohol use, smoking or substance abuse. Final urine culture is negative and hence we'll DC all antibiotics and discharge home. Patient and family will be advised to monitor closely for any further altered mental status and if it happens, will need further detailed evaluation. Cause of altered mental status leading to this admission was unclear.  GERD: Minimize NSAIDs. Consider outpatient GI consultation for EGD. Uses OTC Prilosec  Morbid  obesity: Advised diet and weight loss.  Mild transaminitis:? Fatty liver. Outpatient follow up  Anemia: Seems chronic and stable? Menstrual-related. Outpatient followup     Consultations:  None  Procedures:  None    Discharge Exam:  Complaints:  Heartburn. No nausea vomiting. As per spouse at bedside, mental status back to normal.  Filed Vitals:   08/07/13 1958 08/07/13 2141 08/08/13 0651 08/08/13 1510  BP:  109/74 111/70 113/55  Pulse:  86 86 104  Temp:  99.6 F (37.6 C) 97.8 F (36.6 C) 98.4 F (36.9 C)  TempSrc:  Oral Oral Oral  Resp:  18 18 18   Height: 5\' 1"  (1.549 m)     Weight: 114.9 kg (253 lb 4.9 oz)     SpO2:  96% 98% 98%    General exam: Young female, morbidly obese, was sitting up comfortably on a chair. Respiratory system: Clear. No increased work of breathing. Cardiovascular system: S1 & S2 heard, RRR. No JVD, murmurs, gallops, clicks or pedal edema. Gastrointestinal system: Abdomen is nondistended, soft and nontender. Normal bowel sounds heard. Central nervous system: Alert and oriented. No focal neurological deficits. Extremities: Symmetric 5 x 5 power.  Discharge Instructions      Discharge Orders   Future Orders Complete By Expires   Call MD for:  persistant nausea and vomiting  As directed    Call MD for:  temperature >100.4  As directed    Call MD for:  As directed    Comments:     Confusion or altered mental status.   Diet - low sodium heart healthy  As directed    Increase activity slowly  As directed  Medication List    STOP taking these medications       phenazopyridine 200 MG tablet  Commonly known as:  PYRIDIUM     sulfamethoxazole-trimethoprim 800-160 MG per tablet  Commonly known as:  BACTRIM DS      TAKE these medications       naproxen sodium 220 MG tablet  Commonly known as:  ANAPROX  Take 660 mg by mouth as needed. pain     promethazine 25 MG tablet  Commonly known as:  PHENERGAN  Take 1 tablet (25 mg  total) by mouth every 6 (six) hours as needed for nausea.       Follow-up Information   Follow up with [name], MD. Schedule an appointment as soon as possible for a visit in 3 days. (To be seen with repeat labs (CBC & CMP))    Specialty:  Family Medicine   Contact information:   [address] [address] [phone]        The results of significant diagnostics from this hospitalization (including imaging, microbiology, ancillary and laboratory) are listed below for reference.    Significant Diagnostic Studies: Ct Head Wo Contrast  08/07/2013   CLINICAL DATA:  Altered mental status  EXAM: CT HEAD WITHOUT CONTRAST  TECHNIQUE: Contiguous axial images were obtained from the base of the skull through the vertex without intravenous contrast.  COMPARISON:  None.  FINDINGS: There is no evidence of mass effect, midline shift or extra-axial fluid collections. There is no evidence of a space-occupying lesion or intracranial hemorrhage. There is no evidence of a cortical-based area of acute infarction.  The ventricles and sulci are appropriate for the patient's age. The basal cisterns are patent.  Visualized portions of the orbits are unremarkable. The visualized portions of the paranasal sinuses and mastoid air cells are unremarkable.  The osseous structures are unremarkable.  IMPRESSION: No acute intracranial pathology.   Electronically Signed   By: Kathreen Devoid   On: 08/07/2013 10:21   Dg Chest Port 1 View  08/07/2013   CLINICAL DATA:  Alter LOC, possible allergic reaction, smoker  EXAM: PORTABLE CHEST - 1 VIEW  COMPARISON:  None.  FINDINGS: Cardiomediastinal silhouette is unremarkable. No acute infiltrate or pleural effusion. No pulmonary edema. Mild spurring bilateral distal clavicle.  IMPRESSION: No active disease.   Electronically Signed   By: Lahoma Crocker M.D.   On: 08/07/2013 10:13    Microbiology: Recent Results (from the past 240 hour(s))  URINE CULTURE      Status: None   Collection Time    08/07/13 11:49 AM      Result Value Range Status   Specimen Description URINE, CATHETERIZED   Final   Special Requests NONE   Final   Culture  Setup Time     Final   Value: 08/07/2013 15:02     Performed at [facility]     Final   Value: NO GROWTH     Performed at [facility]   Culture     Final   Value: NO GROWTH     Performed at [facility]   Report Status 08/08/2013 FINAL   Final     Labs: Basic Metabolic Panel:  Recent Labs Lab 08/07/13 0945 08/07/13 1900 08/08/13 0520  NA 133* 135* 136*  K 3.6* 3.6* 3.9  CL 94* 100 101  CO2 25 22 22   GLUCOSE 105* 106* 101*  BUN 12 8 7   CREATININE  1.03 0.82 0.85  CALCIUM 8.6 7.9* 8.3*  MG  --  2.1  --   PHOS  --  2.5  --    Liver Function Tests:  Recent Labs Lab 08/07/13 0945 08/07/13 1900 08/08/13 0520  AST 38* 40* 45*  ALT 30 30 37*  ALKPHOS 96 85 92  BILITOT <0.2* <0.2* 0.2*  PROT 7.3 6.4 7.0  ALBUMIN 3.3* 2.9* 3.1*   No results found for this basename: LIPASE, AMYLASE,  in the last 168 hours No results found for this basename: AMMONIA,  in the last 168 hours CBC:  Recent Labs Lab 08/07/13 0945 08/07/13 1900 08/08/13 0520  WBC 5.1 4.4 4.6  NEUTROABS 4.0 3.3  --   HGB 10.4* 9.0* 9.9*  HCT 32.5* 28.5* 31.6*  MCV 69.3* 69.2* 70.2*  PLT 237 199 230   Cardiac Enzymes: No results found for this basename: CKTOTAL, CKMB, CKMBINDEX, TROPONINI,  in the last 168 hours BNP: BNP (last 3 results) No results found for this basename: PROBNP,  in the last 8760 hours CBG:  Recent Labs Lab 08/08/13 0739  GLUCAP 107*    Additional labs: 1. TSH: 1.005 2. Urine pregnancy test: Negative    Signed:  Vernell Leep, MD, FACP, FHM. [facility] Pager [phone]  If 7PM-7AM, please contact night-coverage [email or web address] Password [email or web address] 08/08/2013, 5:51 PM

## 2013-08-08 NOTE — Progress Notes (Signed)
Lab phoned stated needed another lab slip regarding urine pregnancy test, printed and sent to lab as requested

## 2013-10-17 ENCOUNTER — Emergency Department (HOSPITAL_COMMUNITY): Payer: No Typology Code available for payment source

## 2013-10-17 ENCOUNTER — Emergency Department (HOSPITAL_COMMUNITY): Payer: MEDICAID

## 2013-10-17 ENCOUNTER — Encounter (HOSPITAL_COMMUNITY): Payer: Self-pay | Admitting: Emergency Medicine

## 2013-10-17 ENCOUNTER — Inpatient Hospital Stay (HOSPITAL_COMMUNITY)
Admission: EM | Admit: 2013-10-17 | Discharge: 2013-10-28 | DRG: 653 | Disposition: A | Discharge status: Home Health | Payer: Self-pay | Attending: Family Medicine | Admitting: Family Medicine

## 2013-10-17 DIAGNOSIS — N321 Vesicointestinal fistula: Secondary | ICD-10-CM | POA: Diagnosis present

## 2013-10-17 DIAGNOSIS — C189 Malignant neoplasm of colon, unspecified: Secondary | ICD-10-CM | POA: Diagnosis present

## 2013-10-17 DIAGNOSIS — Z6841 Body Mass Index (BMI) 40.0 and over, adult: Secondary | ICD-10-CM

## 2013-10-17 DIAGNOSIS — E669 Obesity, unspecified: Secondary | ICD-10-CM | POA: Diagnosis present

## 2013-10-17 DIAGNOSIS — D490 Neoplasm of unspecified behavior of digestive system: Secondary | ICD-10-CM

## 2013-10-17 DIAGNOSIS — R4182 Altered mental status, unspecified: Secondary | ICD-10-CM

## 2013-10-17 DIAGNOSIS — D509 Iron deficiency anemia, unspecified: Secondary | ICD-10-CM | POA: Diagnosis present

## 2013-10-17 DIAGNOSIS — R634 Abnormal weight loss: Secondary | ICD-10-CM | POA: Diagnosis present

## 2013-10-17 DIAGNOSIS — C679 Malignant neoplasm of bladder, unspecified: Principal | ICD-10-CM

## 2013-10-17 DIAGNOSIS — N329 Bladder disorder, unspecified: Secondary | ICD-10-CM | POA: Diagnosis present

## 2013-10-17 DIAGNOSIS — E876 Hypokalemia: Secondary | ICD-10-CM | POA: Diagnosis present

## 2013-10-17 DIAGNOSIS — K632 Fistula of intestine: Secondary | ICD-10-CM | POA: Diagnosis present

## 2013-10-17 DIAGNOSIS — F17200 Nicotine dependence, unspecified, uncomplicated: Secondary | ICD-10-CM | POA: Diagnosis present

## 2013-10-17 DIAGNOSIS — N3289 Other specified disorders of bladder: Secondary | ICD-10-CM

## 2013-10-17 DIAGNOSIS — Z87440 Personal history of urinary (tract) infections: Secondary | ICD-10-CM

## 2013-10-17 DIAGNOSIS — K5732 Diverticulitis of large intestine without perforation or abscess without bleeding: Secondary | ICD-10-CM | POA: Diagnosis present

## 2013-10-17 DIAGNOSIS — N390 Urinary tract infection, site not specified: Secondary | ICD-10-CM

## 2013-10-17 DIAGNOSIS — E43 Unspecified severe protein-calorie malnutrition: Secondary | ICD-10-CM | POA: Diagnosis present

## 2013-10-17 DIAGNOSIS — Z79899 Other long term (current) drug therapy: Secondary | ICD-10-CM

## 2013-10-17 DIAGNOSIS — D500 Iron deficiency anemia secondary to blood loss (chronic): Secondary | ICD-10-CM | POA: Diagnosis present

## 2013-10-17 DIAGNOSIS — Z23 Encounter for immunization: Secondary | ICD-10-CM

## 2013-10-17 DIAGNOSIS — C19 Malignant neoplasm of rectosigmoid junction: Secondary | ICD-10-CM | POA: Diagnosis present

## 2013-10-17 DIAGNOSIS — D649 Anemia, unspecified: Secondary | ICD-10-CM

## 2013-10-17 LAB — CBC WITH DIFFERENTIAL/PLATELET
Basophils Absolute: 0 10*3/uL (ref 0.0–0.1)
Basophils Relative: 0 % (ref 0–1)
Eosinophils Absolute: 0.3 10*3/uL (ref 0.0–0.7)
Eosinophils Relative: 4 % (ref 0–5)
HCT: 29.7 % — ABNORMAL LOW (ref 36.0–46.0)
Hemoglobin: 9.3 g/dL — ABNORMAL LOW (ref 12.0–15.0)
Lymphocytes Relative: 15 % (ref 12–46)
Lymphs Abs: 1.3 10*3/uL (ref 0.7–4.0)
MCH: 21.2 pg — ABNORMAL LOW (ref 26.0–34.0)
MCHC: 31.3 g/dL (ref 30.0–36.0)
MCV: 67.8 fL — ABNORMAL LOW (ref 78.0–100.0)
Monocytes Absolute: 0.5 10*3/uL (ref 0.1–1.0)
Monocytes Relative: 6 % (ref 3–12)
Neutro Abs: 6.5 10*3/uL (ref 1.7–7.7)
Neutrophils Relative %: 75 % (ref 43–77)
Platelets: 407 10*3/uL — ABNORMAL HIGH (ref 150–400)
RBC: 4.38 MIL/uL (ref 3.87–5.11)
RDW: 20.8 % — ABNORMAL HIGH (ref 11.5–15.5)
WBC: 8.6 10*3/uL (ref 4.0–10.5)

## 2013-10-17 LAB — URINALYSIS, ROUTINE W REFLEX MICROSCOPIC
Bilirubin Urine: NEGATIVE
Glucose, UA: NEGATIVE mg/dL
Ketones, ur: 15 mg/dL — AB
Nitrite: POSITIVE — AB
Protein, ur: >300 mg/dL — AB
Specific Gravity, Urine: 1.027 (ref 1.005–1.030)
Urobilinogen, UA: 1 mg/dL (ref 0.0–1.0)
pH: 5.5 (ref 5.0–8.0)

## 2013-10-17 LAB — COMPREHENSIVE METABOLIC PANEL
ALT: 9 U/L (ref 0–35)
AST: 14 U/L (ref 0–37)
Albumin: 3.3 g/dL — ABNORMAL LOW (ref 3.5–5.2)
Alkaline Phosphatase: 94 U/L (ref 39–117)
BUN: 6 mg/dL (ref 6–23)
CO2: 24 mEq/L (ref 19–32)
Calcium: 9.6 mg/dL (ref 8.4–10.5)
Chloride: 102 mEq/L (ref 96–112)
Creatinine, Ser: 0.77 mg/dL (ref 0.50–1.10)
GFR calc Af Amer: >90 mL/min (ref >90)
GFR calc non Af Amer: >90 mL/min (ref >90)
Glucose, Bld: 110 mg/dL — ABNORMAL HIGH (ref 70–99)
Potassium: 3.8 mEq/L (ref 3.7–5.3)
Sodium: 140 mEq/L (ref 137–147)
Total Bilirubin: 0.3 mg/dL (ref 0.3–1.2)
Total Protein: 7.4 g/dL (ref 6.0–8.3)

## 2013-10-17 LAB — URINE MICROSCOPIC-ADD ON

## 2013-10-17 LAB — PREGNANCY, URINE: Preg Test, Ur: NEGATIVE

## 2013-10-17 LAB — OCCULT BLOOD X 1 CARD TO LAB, STOOL: Fecal Occult Bld: NEGATIVE

## 2013-10-17 MED ORDER — ENOXAPARIN SODIUM 40 MG/0.4ML ~~LOC~~ SOLN: 40.0000 mg | SUBCUTANEOUS | Status: DC

## 2013-10-17 MED ORDER — ONDANSETRON HCL 4 MG/2ML IJ SOLN: 4.0000 mg | Freq: Four times a day (QID) | INTRAMUSCULAR | Status: DC | PRN

## 2013-10-17 MED ORDER — ENOXAPARIN SODIUM 60 MG/0.6ML ~~LOC~~ SOLN
60.0000 mg | SUBCUTANEOUS | Status: DC
  Administered 2013-10-17 – 2013-10-18 (×2): 60 mg via SUBCUTANEOUS
  Filled 2013-10-17 (×3): qty 0.6

## 2013-10-17 MED ORDER — ONDANSETRON HCL 4 MG PO TABS: 4.0000 mg | ORAL_TABLET | Freq: Four times a day (QID) | ORAL | Status: DC | PRN

## 2013-10-17 MED ORDER — CIPROFLOXACIN IN D5W 400 MG/200ML IV SOLN
400.0000 mg | Freq: Two times a day (BID) | INTRAVENOUS | Status: DC
  Administered 2013-10-17 – 2013-10-25 (×16): 400 mg via INTRAVENOUS
  Filled 2013-10-17 (×18): qty 200

## 2013-10-17 MED ORDER — SODIUM CHLORIDE 0.9 % IV SOLN
INTRAVENOUS | Status: DC
  Administered 2013-10-17 – 2013-10-22 (×3): via INTRAVENOUS

## 2013-10-17 MED ORDER — METRONIDAZOLE IN NACL 5-0.79 MG/ML-% IV SOLN
500.0000 mg | Freq: Three times a day (TID) | INTRAVENOUS | Status: DC
  Administered 2013-10-17 – 2013-10-25 (×23): 500 mg via INTRAVENOUS
  Filled 2013-10-17 (×25): qty 100

## 2013-10-17 MED ORDER — KETOROLAC TROMETHAMINE 30 MG/ML IJ SOLN
30.0000 mg | Freq: Once | INTRAMUSCULAR | Status: AC
  Administered 2013-10-17: 30 mg via INTRAVENOUS
  Filled 2013-10-17: qty 1

## 2013-10-17 MED ORDER — DEXTROSE 5 % IV SOLN
1.0000 g | Freq: Once | INTRAVENOUS | Status: AC
  Administered 2013-10-17: 1 g via INTRAVENOUS
  Filled 2013-10-17: qty 10

## 2013-10-17 MED ORDER — HYDROMORPHONE HCL PF 1 MG/ML IJ SOLN
1.0000 mg | INTRAMUSCULAR | Status: DC | PRN
  Administered 2013-10-17 – 2013-10-20 (×17): 1 mg via INTRAVENOUS
  Filled 2013-10-17 (×19): qty 1

## 2013-10-17 MED ORDER — PNEUMOCOCCAL VAC POLYVALENT 25 MCG/0.5ML IJ INJ
0.5000 mL | INJECTION | INTRAMUSCULAR | Status: DC
  Filled 2013-10-17 (×2): qty 0.5

## 2013-10-17 MED ORDER — IOHEXOL 300 MG/ML  SOLN
50.0000 mL | Freq: Once | INTRAMUSCULAR | Status: AC | PRN
  Administered 2013-10-17: 50 mL via ORAL

## 2013-10-17 MED ORDER — SODIUM CHLORIDE 0.9 % IV BOLUS (SEPSIS)
1000.0000 mL | Freq: Once | INTRAVENOUS | Status: AC
  Administered 2013-10-17: 1000 mL via INTRAVENOUS

## 2013-10-17 MED ORDER — IOHEXOL 300 MG/ML  SOLN
125.0000 mL | Freq: Once | INTRAMUSCULAR | Status: AC | PRN
  Administered 2013-10-17: 125 mL via INTRAVENOUS

## 2013-10-17 NOTE — ED Provider Notes (Signed)
CSN: [id number]     Arrival date & time 10/17/13  0621 History   First MD Initiated Contact with Patient 10/17/13 [phone]     Chief Complaint  Patient presents with  . multiple complaints      (Consider location/radiation/quality/duration/timing/severity/associated sxs/prior Treatment) HPI Comments: Patient history recurrent UTIs presents with abdominal and back pain. She states since January she's had 3 UTIs. Over the last month she's been having some pain in her lower abdomen and across her back, more on the right thigh. She describes as a crampy, achy pain. She states she hasn't had an appetite of the last few weeks and does like she's lost 30 pounds over the last month. Chest has difficulty urinating and said that she can't do the posterior humerus she takes a muscle relaxant. She's noted some blood in her urine with associated blood clots. Currently she is on her normal menstrual period however she does note blood in her urine even when she's not on her menstrual period. She denies any nausea vomiting or fevers. She was admitted to the hospital in February of this year for a urinary tract infection however her cultures and have been negative. She did have a prior culture had Escherichia coli in it, but only 20,000.   History reviewed. No pertinent past medical history. Past Surgical History  Procedure Laterality Date  . Cesarean section      x 2   History reviewed. No pertinent family history. History  Substance Use Topics  . Smoking status: Current Every Day Smoker -- 0.50 packs/day    Types: Cigarettes  . Smokeless tobacco: Never Used  . Alcohol Use: Yes   OB History   Grav Para Term Preterm Abortions TAB SAB Ect Mult Living                 Review of Systems  Constitutional: Positive for unexpected weight change. Negative for fever, chills, diaphoresis and fatigue.  HENT: Negative for congestion, rhinorrhea and sneezing.   Eyes: Negative.   Respiratory: Negative for cough,  chest tightness and shortness of breath.   Cardiovascular: Negative for chest pain and leg swelling.  Gastrointestinal: Positive for abdominal pain. Negative for nausea, vomiting, diarrhea and blood in stool.  Genitourinary: Positive for dysuria, hematuria, flank pain, vaginal bleeding and difficulty urinating. Negative for frequency, vaginal discharge, vaginal pain and menstrual problem.  Musculoskeletal: Positive for back pain. Negative for arthralgias.  Skin: Negative for rash.  Neurological: Negative for dizziness, speech difficulty, weakness, numbness and headaches.      Allergies  Bactrim  Home Medications   Prior to Admission medications   Medication Sig Start Date End Date Taking? Authorizing Provider  Cranberry-Vitamin C-Probiotic (AZO CRANBERRY) 250-30 MG TABS Take 2 tablets by mouth 5 (five) times daily.   Yes Historical Provider, MD  methocarbamol (ROBAXIN) 500 MG tablet Take 500 mg by mouth every 6 (six) hours as needed for muscle spasms.   Yes Historical Provider, MD  naproxen sodium (ANAPROX) 220 MG tablet Take 880 mg by mouth 2 (two) times daily as needed (pain). pain   Yes Historical Provider, MD   BP 129/69  Pulse 93  Temp(Src) 98.4 F (36.9 C) (Oral)  Resp 18  Ht 5\' 1"  (1.549 m)  Wt 250 lb (113.399 kg)  BMI 47.26 kg/m2  SpO2 100%  LMP 10/14/2013 Physical Exam  Constitutional: She is oriented to person, place, and time. She appears well-developed and well-nourished.  HENT:  Head: Normocephalic and atraumatic.  Eyes: Pupils are  equal, round, and reactive to light.  Neck: Normal range of motion. Neck supple.  Cardiovascular: Normal rate, regular rhythm and normal heart sounds.   Pulmonary/Chest: Effort normal and breath sounds normal. No respiratory distress. She has no wheezes. She has no rales. She exhibits no tenderness.  Abdominal: Soft. Bowel sounds are normal. There is tenderness (moderate TTP suprapubic abd and across lower abd, more on the left.   +CVA  tenderness bilaterally). There is no rebound and no guarding.  Musculoskeletal: Normal range of motion. She exhibits no edema.  Lymphadenopathy:    She has no cervical adenopathy.  Neurological: She is alert and oriented to person, place, and time.  Skin: Skin is warm and dry. No rash noted.  Psychiatric: She has a normal mood and affect.    ED Course  Procedures (including critical care time) Labs Review Labs Reviewed  URINALYSIS, ROUTINE W REFLEX MICROSCOPIC - Abnormal; Notable for the following:    Color, Urine AMBER (*)    APPearance TURBID (*)    Hgb urine dipstick LARGE (*)    Ketones, ur 15 (*)    Protein, ur >300 (*)    Nitrite POSITIVE (*)    Leukocytes, UA MODERATE (*)    All other components within normal limits  COMPREHENSIVE METABOLIC PANEL - Abnormal; Notable for the following:    Glucose, Bld 110 (*)    Albumin 3.3 (*)    All other components within normal limits  CBC WITH DIFFERENTIAL - Abnormal; Notable for the following:    Hemoglobin 9.3 (*)    HCT 29.7 (*)    MCV 67.8 (*)    MCH 21.2 (*)    RDW 20.8 (*)    Platelets 407 (*)    All other components within normal limits  URINE MICROSCOPIC-ADD ON - Abnormal; Notable for the following:    Bacteria, UA MANY (*)    All other components within normal limits  PREGNANCY, URINE  OCCULT BLOOD X 1 CARD TO LAB, STOOL    Imaging Review Ct Abdomen Pelvis Wo Contrast  10/17/2013   CLINICAL DATA:  Right flank pain and hematuria. Dysuria. Recent UTI  EXAM: CT ABDOMEN AND PELVIS WITHOUT CONTRAST  TECHNIQUE: Multidetector CT imaging of the abdomen and pelvis was performed following the standard protocol without intravenous contrast.  COMPARISON:  None.  FINDINGS: Lung bases are clear. Unenhanced images of the liver gallbladder and bile ducts are normal. Pancreas and spleen are normal.  The kidneys show no obstruction or mass.  No urinary tract calculi.  Thickening of the sigmoid colon just above the urinary bladder. There  is stranding in the pericolonic fat. There is a soft tissue structure above the bladder and below the sigmoid thickening containing gas bubbles, worrisome for abscess. This structure measures 6.5 x 5.8 cm. No free air or free fluid is identified. Urinary bladder appears to be empty and presumably is below this abscess. Further evaluation with oral and IV contrast is suggested.  Appendix is normal.  No acute bony change.  IMPRESSION: Negative for urinary tract calculi.  Thickening of the sigmoid colon consistent with diverticulitis. Just below the thickened sigmoid colon, there is a soft tissue mass containing gas bubbles, worrisome for abscess. Further evaluation with oral and IV contrast is suggested to confirm these findings.  I discussed the findings with Dr. Tamera Punt.   Electronically Signed   By: Franchot Gallo M.D.   On: 10/17/2013 07:39   Ct Abdomen Pelvis W Contrast  10/17/2013  CLINICAL DATA:  Pelvic pain with dysuria and hematuria. Rule out diverticulitis or tumor.  EXAM: CT ABDOMEN AND PELVIS WITH CONTRAST  TECHNIQUE: Multidetector CT imaging of the abdomen and pelvis was performed using the standard protocol following bolus administration of intravenous contrast.  CONTRAST:  155mL OMNIPAQUE IOHEXOL 300 MG/ML  SOLN  COMPARISON:  CT abdomen without contrast earlier today  FINDINGS: Lung bases are clear. Liver gallbladder and bile ducts are normal. Pancreas and spleen are normal. No renal obstruction or renal mass.  Segmental thickening of the sigmoid colon is again noted in the midline. There is mild stranding in the pericolonic fat. Irregular soft tissue mass is present above the bladder with invasion of the dome of the bladder. Delayed images of the bladder reveal irregularity of the dome of the bladder with apparent invasion by the mass lesion. The appearance is most consistent with tumor rather than infection. This mass contains enhancing soft tissue as well as central fluid and gas. The mass measures  approximately 5.5 x 7 cm and is located in the midline. There is a small amount of gas in the bladder due to bowel communication. There is a fistulous tract between the sigmoid colon and the mass.  Several gas bubbles are present posterior to the sigmoid colon which may represent extra luminal gas. There is a small amount of free fluid. Small subcentimeter mesenteric lymph nodes are present which could be due to metastatic disease.  Negative for bowel obstruction. Appendix normal. Uterus is not enlarged and appears separate from the mass.  IMPRESSION: 5.5 x 7 cm mass lesion above the dome of the bladder with bladder invasion. There is also segmental thickening of the sigmoid colon. There is a fistulous communication between the mass in the sigmoid colon. Differential includes infection and tumor. I would favor tumor given the enhancing soft tissue mass lesion. This may be a urogenital neoplasm such is a bladder carcinoma or urachal carcinoma invading the sigmoid colon. Colon cancer invading the bladder is also possible. Mildly prominent mesenteric lymph nodes could be due to metastatic disease. Small gas bubbles are present posterior to sigmoid colon suggesting contained perforation. Small amount of free fluid.   Electronically Signed   By: Franchot Gallo M.D.   On: 10/17/2013 09:51     EKG Interpretation None      MDM   Final diagnoses:  Bladder mass    7:44, pt had noncontrast CT to evaluate for kidney stone, neg for stone, radiologist thinks that their might be diverticulitis with a diverticular abscess.  Recommends repeat CT with contrast. Pt with chronic anemia, similar to labs in January/February.  Denies heavy periods.  Denies known blood in her stool.  Patient's CT scan shows a large mass protruding from the dome of the bladder communication with the colon with fistula to colon.  Notified [facility] who will see pt, they are requesting [facility] to admit.  Spoke with Dr. Doyle Askew with [facility].  At this  point, they are discussing which team will admit pt.  Also notified Dr Gaynelle Arabian with urology who will see pt.  Malvin Johns, MD 10/17/13 1041

## 2013-10-17 NOTE — H&P (Signed)
[facility] History and Physical  Chanci Ojala DPO:[id number] DOB: May 08, 1971 DOA: 10/17/2013  Referring physician: ED physician PCP: [name], MD   Chief Complaint: abdominal pain, hematuria, weight loss   HPI:  Pt is 43 yo female with recurrent UTI, presenting to [facility] ED with main concern several months duration of progressively worsening lower quadrant abdominal pain, throbbing and intermittent,5/10 in severity when present, occasionally but not consistently radiating to the lower back area, right > left thigh area. She reports assocaited malaise, fatigue, 30 lbs weight loss in the past 1 and 1/2 months, but no significant change in appetite. She has also noted several days duration of urinary urgency, frequency, dysuria, passing blood clots in the urine and even though she is currently menstruating, she can still tell there is blood in urine even in the absence of menstruation. She denies chest pain, shortness of breath, no fevers, chills, no recent sick contacts or exposures, no other similar events in the past.   Work up in the ED, shows low grade temp 99.7, WBC 8.6, CMP is normal aside from a low albumin. Hematuria with UTI. Fecal occult negative. CT scan shows: 5.5 x 7 cm mass lesion above the dome of the bladder with bladder invasion. There is also segmental thickening of the sigmoid colon. There is a fistulous communication between the mass in the sigmoid colon. Differential includes infection and tumor, favor tumor given the enhancing soft tissue mass lesion, ? bladder carcinoma or urachal carcinoma invading the sigmoid colon, ? colon cancer invading the bladder. Mildly prominent mesenteric lymph nodes could be due to metastatic disease. Small gas bubbles are present posterior to sigmoid colon suggesting contained perforation.  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  - etiology is not very clear at this time, diverticulitis vs cancer, secondary colo-vesicle fistula - appreciate surgery and urology input - will place on Cipro and Flagyl as recommended per surgery tea, - will follow up on recommendations - provide supportive care with analgesia, antiemetics as needed to ensure pt is comfortable UTI - ABX above should be adequate in coverage  - follow up on urine culture   Radiological Exams on Admission: Ct Abdomen Pelvis Wo Contrast  10/17/2013   Negative for urinary tract calculi.  Thickening of the sigmoid colon consistent with diverticulitis. Just below the thickened sigmoid colon, there is a soft tissue mass containing gas bubbles, worrisome for abscess. Further evaluation with oral and IV contrast is suggested to confirm these findings.  Ct Abdomen Pelvis W Contrast  10/17/2013    5.5 x 7 cm mass lesion above the dome of the bladder with bladder invasion. There is also segmental thickening of the sigmoid colon. There is a fistulous communication between the mass in the sigmoid colon. Differential includes infection and tumor. I would favor tumor given the enhancing soft tissue mass lesion. This may be a urogenital neoplasm such is a bladder carcinoma or urachal carcinoma invading the sigmoid colon. Colon cancer invading the bladder is also possible. Mildly prominent mesenteric lymph nodes could be due to metastatic disease. Small gas bubbles are present posterior to sigmoid colon suggesting contained perforation. Small amount of free fluid.     Code Status: Full Family Communication: Pt at bedside Disposition Plan: Admit for further evaluation    Review of Systems:  Constitutional: Negative for fever, chills. Negative for diaphoresis.  HENT: Negative for  hearing loss, ear pain, nosebleeds, congestion, sore throat, neck pain, tinnitus and ear discharge.   Eyes: Negative for blurred vision, double vision, photophobia, pain, discharge and redness.   Respiratory: Negative for cough, hemoptysis, sputum production, shortness of breath, wheezing and stridor.   Cardiovascular: Negative for chest pain, palpitations, orthopnea, claudication and leg swelling.  Gastrointestinal: Negative for heartburn, constipation, blood in stool and melena.  Genitourinary: Positive for dysuria, urgency, frequency, hematuria and flank pain.  Musculoskeletal: Negative for myalgias, back pain, joint pain and falls.  Skin: Negative for itching and rash.  Neurological:  Negative for tingling, tremors, sensory change, speech change, focal weakness, loss of consciousness and headaches.  Endo/Heme/Allergies: Negative for environmental allergies and polydipsia. Does not bruise/bleed easily.  Psychiatric/Behavioral: Negative for suicidal ideas. The patient is not nervous/anxious.      No known PMH except recurrent UTI  Past Surgical History  Procedure Laterality Date  . Cesarean section      x 2    Social History:  reports that she has been smoking Cigarettes.  She has been smoking about 0.50 packs per day. She has never used smokeless tobacco. She reports that she drinks alcohol. She reports that she does not use illicit drugs.  Allergies  Allergen Reactions  . Bactrim [Sulfamethoxazole-Tmp Ds]     No pertinent family medical history   Prior to Admission medications   Medication Sig Start Date End Date Taking? Authorizing Provider  Cranberry-Vitamin C-Probiotic (AZO CRANBERRY) 250-30 MG TABS Take 2 tablets by mouth 5 (five) times daily.   Yes Historical Provider, MD  methocarbamol (ROBAXIN) 500 MG tablet Take 500 mg by mouth every 6 (six) hours as needed for muscle spasms.   Yes Historical Provider, MD  naproxen sodium (ANAPROX) 220 MG tablet Take 880 mg by mouth 2 (two) times daily as needed (pain). pain   Yes Historical Provider, MD    Physical Exam: Filed Vitals:   10/17/13 0627 10/17/13 1022  BP: 120/72 129/69  Pulse: 120 93  Temp: 99.7 F (37.6  C) 98.4 F (36.9 C)  TempSrc: Oral Oral  Resp: 20 18  Height: 5\' 1"  (1.549 m)   Weight: 113.399 kg (250 lb)   SpO2: 98% 100%    Physical Exam  Constitutional: Appears well-developed and well-nourished. No distress.  HENT: Normocephalic. External right and left ear normal. Oropharynx is clear and moist.  Eyes: Conjunctivae and EOM are normal. PERRLA, no scleral icterus.  Neck: Normal ROM. Neck supple. No JVD. No tracheal deviation. No thyromegaly.  CVS: RRR, S1/S2 +, no murmurs, no gallops, no carotid bruit.  Pulmonary: Effort and breath sounds normal, no stridor, rhonchi, wheezes, rales.  Abdominal: Soft. BS +,  no distension, tenderness in lower abdominal quadrants R > L, no rebound or guarding.  Musculoskeletal: Normal range of motion. No edema and no tenderness.  Lymphadenopathy: No lymphadenopathy noted, cervical, inguinal. Neuro: Alert. Normal reflexes, muscle tone coordination. No cranial nerve deficit. Skin: Skin is warm and dry. No rash noted. Not diaphoretic. No erythema. No pallor.  Psychiatric: Normal mood and affect. Behavior, judgment, thought content normal.   Labs on Admission:  Basic Metabolic Panel:  Recent Labs Lab 10/17/13 0650  NA 140  K 3.8  CL 102  CO2 24  GLUCOSE 110*  BUN 6  CREATININE 0.77  CALCIUM 9.6   Liver Function Tests:  Recent Labs Lab 10/17/13 0650  AST 14  ALT 9  ALKPHOS 94  BILITOT 0.3  PROT 7.4  ALBUMIN 3.3*  CBC:  Recent Labs Lab 10/17/13 0650  WBC 8.6  NEUTROABS 6.5  HGB 9.3*  HCT 29.7*  MCV 67.8*  PLT 407*   EKG: Normal sinus rhythm, no ST/T wave changes  Theodis Blaze, MD  [facility] Pager [phone]  If 7PM-7AM, please contact night-coverage [email or web address] Password [facility] 10/17/2013, 10:45 AM

## 2013-10-17 NOTE — Consult Note (Signed)
Seen, agree with above.  Pt with probable colovesical fistula from mass vs infection.  Will get urology consult.   Will likely get gi consult as well, although would likely not do colonoscopy in setting of possible microperforation of colon.

## 2013-10-17 NOTE — Consult Note (Signed)
Urology Consult  Referring physician:   Dr. [name] Reason for referral:   Bladder mass, recurrent UTI  Chief Complaint: recurrent bladder infection  History of Present Illness:   HPI: This is a 43 y/o female who has had recurrent UTI's going back to February,2015. She was admitted 08/07/13 with confusion and UTI. She was treated with with Bactrim and possibly Cipro as an outpatient. She got IV Cipro in the hospital. Urine culture here did not grow anything. She was discharged home after that. She says her symptoms got better with antibiotics, but never completely resolved. The symptoms seem to come and go. She has recently been having more urinary frequency, urgency, dysuria, pneumaturia, back pain, abdominal pain and flank pain. She says all her home remedies are failing and she presents to the ER. She has been eating some on and off since her last admit. Sometimes she just has pain with PO intake, other times she does not. She has not had nausea or vomiting, but has lost 30 pounds over the last 1.5 months.  Work up in the ED, shows low grade temp 99.7, WBC 8.6, CMP is normal aside from a low albumin. Hematuria with UTI. Fecal occult is negative. CT scan shows: 5.5 x 7 cm mass lesion above the dome of the bladder with bladder invasion. There is also segmental thickening of the sigmoid colon. There is a fistulous communication between the mass in the sigmoid colon. Differential includes infection and tumor. I would favor tumor given the enhancing soft tissue mass lesion. This may be a urogenital neoplasm such is a bladder carcinoma or urachal carcinoma invading the sigmoid colon. Colon cancer invading the bladder is also possible. Mildly prominent mesenteric lymph nodes could be due to metastatic disease. Small gas bubbles are present posterior to sigmoid colon suggesting contained perforation. Small amount of free fluid. We are ask to see.  History reviewed. No pertinent past medical history.    History  reviewed. No pertinent past medical history. Past Surgical History  Procedure Laterality Date  . Cesarean section      x 2    Medications: I have reviewed the patient's current medications. Allergies:  Allergies  Allergen Reactions  . Bactrim [Sulfamethoxazole-Tmp Ds]     History reviewed. No pertinent family history. Social History:  reports that she has been smoking Cigarettes.  She has been smoking about 0.50 packs per day. She has never used smokeless tobacco. She reports that she drinks alcohol. She reports that she does not use illicit drugs.  ROS: All systems are reviewed and negative except as noted.   Physical Exam:  Vital signs in last 24 hours: Temp:  [98.4 F (36.9 C)-99.7 F (37.6 C)] 98.7 F (37.1 C) (04/21 1149) Pulse Rate:  [93-120] 108 (04/21 1149) Resp:  [18-20] 18 (04/21 1149) BP: (120-149)/(69-87) 149/87 mmHg (04/21 1149) SpO2:  [98 %-100 %] 100 % (04/21 1149) Weight:  [113.399 kg (250 lb)] 113.399 kg (250 lb) (04/21 1149)  Cardiovascular: Skin warm; not flushed Respiratory: Breaths quiet; no shortness of breath Abdomen: No masses.obese. Abd: non-tender.  Neurological: Normal sensation to touch Musculoskeletal: Normal motor function arms and legs Lymphatics: No inguinal adenopathy Skin: No rashes Genitourinary:  Normal BUS.   Laboratory Data:  Results for orders placed during the hospital encounter of 10/17/13 (from the past 72 hour(s))  URINALYSIS, ROUTINE W REFLEX MICROSCOPIC     Status: Abnormal   Collection Time    10/17/13  6:38 AM      Result  Value Ref Range   Color, Urine AMBER (*) YELLOW   Comment: BIOCHEMICALS MAY BE AFFECTED BY COLOR   APPearance TURBID (*) CLEAR   Specific Gravity, Urine 1.027  1.005 - 1.030   pH 5.5  5.0 - 8.0   Glucose, UA NEGATIVE  NEGATIVE mg/dL   Hgb urine dipstick LARGE (*) NEGATIVE   Bilirubin Urine NEGATIVE  NEGATIVE   Ketones, ur 15 (*) NEGATIVE mg/dL   Protein, ur >300 (*) NEGATIVE mg/dL   Urobilinogen,  UA 1.0  0.0 - 1.0 mg/dL   Nitrite POSITIVE (*) NEGATIVE   Leukocytes, UA MODERATE (*) NEGATIVE  PREGNANCY, URINE     Status: None   Collection Time    10/17/13  6:38 AM      Result Value Ref Range   Preg Test, Ur NEGATIVE  NEGATIVE   Comment:            THE SENSITIVITY OF THIS     METHODOLOGY IS >20 mIU/mL.  URINE MICROSCOPIC-ADD ON     Status: Abnormal   Collection Time    10/17/13  6:38 AM      Result Value Ref Range   Squamous Epithelial / LPF RARE  RARE   WBC, UA 21-50  <3 WBC/hpf   RBC / HPF 21-50  <3 RBC/hpf   Bacteria, UA MANY (*) RARE  COMPREHENSIVE METABOLIC PANEL     Status: Abnormal   Collection Time    10/17/13  6:50 AM      Result Value Ref Range   Sodium 140  137 - 147 mEq/L   Potassium 3.8  3.7 - 5.3 mEq/L   Chloride 102  96 - 112 mEq/L   CO2 24  19 - 32 mEq/L   Glucose, Bld 110 (*) 70 - 99 mg/dL   BUN 6  6 - 23 mg/dL   Creatinine, Ser 0.77  0.50 - 1.10 mg/dL   Calcium 9.6  8.4 - 10.5 mg/dL   Total Protein 7.4  6.0 - 8.3 g/dL   Albumin 3.3 (*) 3.5 - 5.2 g/dL   AST 14  0 - 37 U/L   ALT 9  0 - 35 U/L   Alkaline Phosphatase 94  39 - 117 U/L   Total Bilirubin 0.3  0.3 - 1.2 mg/dL   GFR calc non Af Amer >90  >90 mL/min   GFR calc Af Amer >90  >90 mL/min   Comment: (NOTE)     The eGFR has been calculated using the CKD EPI equation.     This calculation has not been validated in all clinical situations.     eGFR's persistently <90 mL/min signify possible Chronic Kidney     Disease.  CBC WITH DIFFERENTIAL     Status: Abnormal   Collection Time    10/17/13  6:50 AM      Result Value Ref Range   WBC 8.6  4.0 - 10.5 K/uL   RBC 4.38  3.87 - 5.11 MIL/uL   Hemoglobin 9.3 (*) 12.0 - 15.0 g/dL   HCT 29.7 (*) 36.0 - 46.0 %   MCV 67.8 (*) 78.0 - 100.0 fL   MCH 21.2 (*) 26.0 - 34.0 pg   MCHC 31.3  30.0 - 36.0 g/dL   RDW 20.8 (*) 11.5 - 15.5 %   Platelets 407 (*) 150 - 400 K/uL   Neutrophils Relative % 75  43 - 77 %   Lymphocytes Relative 15  12 - 46 %    Monocytes Relative  6  3 - 12 %   Eosinophils Relative 4  0 - 5 %   Basophils Relative 0  0 - 1 %   Neutro Abs 6.5  1.7 - 7.7 K/uL   Lymphs Abs 1.3  0.7 - 4.0 K/uL   Monocytes Absolute 0.5  0.1 - 1.0 K/uL   Eosinophils Absolute 0.3  0.0 - 0.7 K/uL   Basophils Absolute 0.0  0.0 - 0.1 K/uL   RBC Morphology POLYCHROMASIA PRESENT     Comment: TARGET CELLS   Smear Review GIANT PLATELETS SEEN    OCCULT BLOOD X 1 CARD TO LAB, STOOL     Status: None   Collection Time    10/17/13  8:14 AM      Result Value Ref Range   Fecal Occult Bld NEGATIVE  NEGATIVE   No results found for this or any previous visit (from the past 240 hour(s)). Creatinine:  Recent Labs  10/17/13 0650  CREATININE 0.77    Xrays:   CT ABDOMEN AND PELVIS WITH CONTRAST  TECHNIQUE:  Multidetector CT imaging of the abdomen and pelvis was performed  using the standard protocol following bolus administration of  intravenous contrast.  CONTRAST: 156m OMNIPAQUE IOHEXOL 300 MG/ML SOLN  COMPARISON: CT abdomen without contrast earlier today  FINDINGS:  Lung bases are clear. Liver gallbladder and bile ducts are normal.  Pancreas and spleen are normal. No renal obstruction or renal mass.  Segmental thickening of the sigmoid colon is again noted in the  midline. There is mild stranding in the pericolonic fat. Irregular  soft tissue mass is present above the bladder with invasion of the  dome of the bladder. Delayed images of the bladder reveal  irregularity of the dome of the bladder with apparent invasion by  the mass lesion. The appearance is most consistent with tumor rather  than infection. This mass contains enhancing soft tissue as well as  central fluid and gas. The mass measures approximately 5.5 x 7 cm  and is located in the midline. There is a small amount of gas in the  bladder due to bowel communication. There is a fistulous tract  between the sigmoid colon and the mass.  Several gas bubbles are present posterior  to the sigmoid colon which  may represent extra luminal gas. There is a small amount of free  fluid. Small subcentimeter mesenteric lymph nodes are present which  could be due to metastatic disease.  Negative for bowel obstruction. Appendix normal. Uterus is not  enlarged and appears separate from the mass.  IMPRESSION:  5.5 x 7 cm mass lesion above the dome of the bladder with bladder  invasion. There is also segmental thickening of the sigmoid colon.  There is a fistulous communication between the mass in the sigmoid  colon. Differential includes infection and tumor. I would favor  tumor given the enhancing soft tissue mass lesion. This may be a  urogenital neoplasm such is a bladder carcinoma or urachal carcinoma  invading the sigmoid colon. Colon cancer invading the bladder is  also possible. Mildly prominent mesenteric lymph nodes could be due  to metastatic disease. Small gas bubbles are present posterior to  sigmoid colon suggesting contained perforation. Small amount of free  fluid.  Electronically Signed  By: CFranchot GalloM.D.  On: 10/17/2013 09:51    Impression/Assessment:    1. 5.5 x 7 cm mass above the dome of the bladder, with invasion into the bladder. Fistula from the sigmoid colon.  2.  Bladder cancer with possible invasion of the colon, although bladder dome cancers are usually urachal in origin, and do not present as a colovesical fistula.    She could have primary colon cancer with invasion into the bladder, but, most likely  could have diverticulitis with urinary fistula between colon and bladder. I have reviewed the films with Radiology, who feels that the pt has primary colon disease: diverticulitis vs cancer, with 2ndary colo-vesicle fistula, and that no further bladder radiographic evaluation need be done. She could have flex bedside cystoscopy, but, in the end, will need abdominal exploration, and probable colon resection with coincidental bladder resection and  omental interposition.  3. UTI  4. Weight loss  5. BMI 47  6. Hx of tobacco use    Plan: 1. No further bladder x-rays 2. Continue IV antibiotics 3. Urine c/s pending 4. Consider bedside cystoscopy. ( probably Thursday).  5. Consider abdominal exploration per general surgery.   Ailene Rud 10/17/2013, 12:16 PM

## 2013-10-17 NOTE — Care Management Note (Signed)
CARE MANAGEMENT NOTE 10/17/2013  Patient:  Amy Bentley, Amy Bentley   Account Number:  [id number]  Date Initiated:  10/17/2013  Documentation initiated by:  Lisette Mancebo  Subjective/Objective Assessment:   43 yo female admitted with UTI. Hx of bladder cancer.     Action/Plan:   Home when stable   Anticipated DC Date:     Anticipated DC Plan:  HOME/SELF CARE  In-house referral  [place]  CM consult      Choice offered to / List presented to:  NA   DME arranged  NA      DME agency  NA     [place] arranged  NA      [place] agency  NA   Status of service:  In process, will continue to follow Medicare Important Message given?   (If response is "NO", the following Medicare IM given date fields will be blank) Date Medicare IM given:   Date Additional Medicare IM given:    Discharge Disposition:    Per UR Regulation:  Reviewed for med. necessity/level of care/duration of stay  If discussed at [place] of Stay Meetings, dates discussed:    Comments:  10/17/13 [place] Chart reviewed for utilization of services. PCP: [name], MD. Self-pay. pt may qualify for [place] at time of discharge. Financial counselor to consult.

## 2013-10-17 NOTE — Consult Note (Signed)
Earnstine Regal, PA-C Physician Assistant Incomplete Surgery H&P Service date: 10/17/2013 10:50 AM   Amy Bentley is an 43 y.o. female.   PCP: [name], MD   Chief Complaint: Pain back, lower abdomen, sides, frequency, urgency, dysuria.  HPI: This is a 43 y/o female who has had recurrent UTI's going back to February,2015. She was admitted 08/07/13 with confusion and UTI. She was treated with with Bactrim and possibly Cipro as an outpatient. She got IV Cipro in the hospital. Urine culture here did not grow anything. She was discharged home after that. She says her symptoms got better with antibiotics, but never completely resolved. The symptoms seem to come and go. She has recently been having more urinary frequency, urgency, dysuria, pneumaturia, back pain, abdominal pain and flank pain. She says all her home remedies are failing and she presents to the ER. She has been eating some on and off since her last admit. Sometimes she just has pain with PO intake, other times she does not. She has not had nausea or vomiting, but has lost 30 pounds over the last 1.5 months.  Work up in the ED, shows low grade temp 99.7, WBC 8.6, CMP is normal aside from a low albumin. Hematuria with UTI. Fecal occult is negative. CT scan shows: 5.5 x 7 cm mass lesion above the dome of the bladder with bladder invasion. There is also segmental thickening of the sigmoid colon. There is a fistulous communication between the mass in the sigmoid colon. Differential includes infection and tumor. I would favor tumor given the enhancing soft tissue mass lesion. This may be a urogenital neoplasm such is a bladder carcinoma or urachal carcinoma invading the sigmoid colon. Colon cancer invading the bladder is also possible. Mildly prominent mesenteric lymph nodes could be due to metastatic disease. Small gas bubbles are present posterior to sigmoid colon suggesting contained perforation. Small amount of free fluid. We are ask  to see.  History reviewed. No pertinent past medical history.  Her only medical issues have been her UTI  Hx of tobacco use.  Body mass index is 47.26 kg/(m^2).    Past Surgical History    Procedure  Laterality  Date    .  Cesarean section        x 1    History reviewed. No pertinent family history.  Social History: reports that she has been smoking Cigarettes. She has been smoking about 0.50 packs per day. She has never used smokeless tobacco. She reports that she drinks alcohol. She reports that she does not use illicit drugs.  Tobacco .5 PPD for 21 years  ETOH: Rare  DRugs: None  Married  She works in [employer] for [employer]. She has been going to work even when she was feeling bad.  Allergies:    Allergies    Allergen  Reactions    .  Bactrim [Sulfamethoxazole-Tmp Ds]      Family hx:  Father deceased at 42 after MI, he also had prostate Ca and AODM  Mother living with CHF, Hyperthyroid, and hypertension  1 brother and 1 sister both in good health    Results for orders placed during the hospital encounter of 10/17/13 (from the past 61 hour(s))    URINALYSIS, ROUTINE W REFLEX MICROSCOPIC Status: Abnormal     Collection Time     10/17/13 6:38 AM    Result  Value  Ref Range     Color, Urine  AMBER (*)  YELLOW  Comment:  BIOCHEMICALS MAY BE AFFECTED BY COLOR     APPearance  TURBID (*)  CLEAR     Specific Gravity, Urine  1.027  1.005 - 1.030     pH  5.5  5.0 - 8.0     Glucose, UA  NEGATIVE  NEGATIVE mg/dL     Hgb urine dipstick  LARGE (*)  NEGATIVE     Bilirubin Urine  NEGATIVE  NEGATIVE     Ketones, ur  15 (*)  NEGATIVE mg/dL     Protein, ur  >300 (*)  NEGATIVE mg/dL     Urobilinogen, UA  1.0  0.0 - 1.0 mg/dL     Nitrite  POSITIVE (*)  NEGATIVE     Leukocytes, UA  MODERATE (*)  NEGATIVE    PREGNANCY, URINE Status: None     Collection Time     10/17/13 6:38 AM    Result  Value  Ref Range     Preg Test, Ur  NEGATIVE  NEGATIVE     Comment:       THE SENSITIVITY OF  THIS      METHODOLOGY IS >20 mIU/mL.    URINE MICROSCOPIC-ADD ON Status: Abnormal     Collection Time     10/17/13 6:38 AM    Result  Value  Ref Range     Squamous Epithelial / LPF  RARE  RARE     WBC, UA  21-50  <3 WBC/hpf     RBC / HPF  21-50  <3 RBC/hpf     Bacteria, UA  MANY (*)  RARE    COMPREHENSIVE METABOLIC PANEL Status: Abnormal     Collection Time     10/17/13 6:50 AM    Result  Value  Ref Range     Sodium  140  137 - 147 mEq/L     Potassium  3.8  3.7 - 5.3 mEq/L     Chloride  102  96 - 112 mEq/L     CO2  24  19 - 32 mEq/L     Glucose, Bld  110 (*)  70 - 99 mg/dL     BUN  6  6 - 23 mg/dL     Creatinine, Ser  0.77  0.50 - 1.10 mg/dL     Calcium  9.6  8.4 - 10.5 mg/dL     Total Protein  7.4  6.0 - 8.3 g/dL     Albumin  3.3 (*)  3.5 - 5.2 g/dL     AST  14  0 - 37 U/L     ALT  9  0 - 35 U/L     Alkaline Phosphatase  94  39 - 117 U/L     Total Bilirubin  0.3  0.3 - 1.2 mg/dL     GFR calc non Af Amer  >90  >90 mL/min     GFR calc Af Amer  >90  >90 mL/min     Comment:  (NOTE)      The eGFR has been calculated using the CKD EPI equation.      This calculation has not been validated in all clinical situations.      eGFR's persistently <90 mL/min signify possible Chronic Kidney      Disease.    CBC WITH DIFFERENTIAL Status: Abnormal     Collection Time     10/17/13 6:50 AM    Result  Value  Ref Range     WBC  8.6  4.0 - 10.5 K/uL     RBC  4.38  3.87 - 5.11 MIL/uL     Hemoglobin  9.3 (*)  12.0 - 15.0 g/dL     HCT  29.7 (*)  36.0 - 46.0 %     MCV  67.8 (*)  78.0 - 100.0 fL     MCH  21.2 (*)  26.0 - 34.0 pg     MCHC  31.3  30.0 - 36.0 g/dL     RDW  20.8 (*)  11.5 - 15.5 %     Platelets  407 (*)  150 - 400 K/uL     Neutrophils Relative %  75  43 - 77 %     Lymphocytes Relative  15  12 - 46 %     Monocytes Relative  6  3 - 12 %     Eosinophils Relative  4  0 - 5 %     Basophils Relative  0  0 - 1 %     Neutro Abs  6.5  1.7 - 7.7 K/uL     Lymphs Abs  1.3  0.7 - 4.0  K/uL     Monocytes Absolute  0.5  0.1 - 1.0 K/uL     Eosinophils Absolute  0.3  0.0 - 0.7 K/uL     Basophils Absolute  0.0  0.0 - 0.1 K/uL     RBC Morphology  POLYCHROMASIA PRESENT      Comment:  TARGET CELLS     Smear Review  GIANT PLATELETS SEEN     OCCULT BLOOD X 1 CARD TO LAB, STOOL Status: None     Collection Time     10/17/13 8:14 AM    Result  Value  Ref Range     Fecal Occult Bld  NEGATIVE  NEGATIVE     Ct Abdomen Pelvis Wo Contrast  10/17/2013 CLINICAL DATA: Right flank pain and hematuria. Dysuria. Recent UTI EXAM: CT ABDOMEN AND PELVIS WITHOUT CONTRAST TECHNIQUE: Multidetector CT imaging of the abdomen and pelvis was performed following the standard protocol without intravenous contrast. COMPARISON: None. FINDINGS: Lung bases are clear. Unenhanced images of the liver gallbladder and bile ducts are normal. Pancreas and spleen are normal. The kidneys show no obstruction or mass. No urinary tract calculi. Thickening of the sigmoid colon just above the urinary bladder. There is stranding in the pericolonic fat. There is a soft tissue structure above the bladder and below the sigmoid thickening containing gas bubbles, worrisome for abscess. This structure measures 6.5 x 5.8 cm. No free air or free fluid is identified. Urinary bladder appears to be empty and presumably is below this abscess. Further evaluation with oral and IV contrast is suggested. Appendix is normal. No acute bony change. IMPRESSION: Negative for urinary tract calculi. Thickening of the sigmoid colon consistent with diverticulitis. Just below the thickened sigmoid colon, there is a soft tissue mass containing gas bubbles, worrisome for abscess. Further evaluation with oral and IV contrast is suggested to confirm these findings. I discussed the findings with Dr. Tamera Punt. Electronically Signed By: Franchot Gallo M.D. On: 10/17/2013 07:39  Ct Abdomen Pelvis W Contrast  10/17/2013 CLINICAL DATA: Pelvic pain with dysuria and hematuria.  Rule out diverticulitis or tumor. EXAM: CT ABDOMEN AND PELVIS WITH CONTRAST TECHNIQUE: Multidetector CT imaging of the abdomen and pelvis was performed using the standard protocol following bolus administration of intravenous contrast. CONTRAST: 139m OMNIPAQUE IOHEXOL 300 MG/ML SOLN COMPARISON: CT abdomen without contrast earlier today FINDINGS: Lung  bases are clear. Liver gallbladder and bile ducts are normal. Pancreas and spleen are normal. No renal obstruction or renal mass. Segmental thickening of the sigmoid colon is again noted in the midline. There is mild stranding in the pericolonic fat. Irregular soft tissue mass is present above the bladder with invasion of the dome of the bladder. Delayed images of the bladder reveal irregularity of the dome of the bladder with apparent invasion by the mass lesion. The appearance is most consistent with tumor rather than infection. This mass contains enhancing soft tissue as well as central fluid and gas. The mass measures approximately 5.5 x 7 cm and is located in the midline. There is a small amount of gas in the bladder due to bowel communication. There is a fistulous tract between the sigmoid colon and the mass. Several gas bubbles are present posterior to the sigmoid colon which may represent extra luminal gas. There is a small amount of free fluid. Small subcentimeter mesenteric lymph nodes are present which could be due to metastatic disease. Negative for bowel obstruction. Appendix normal. Uterus is not enlarged and appears separate from the mass. IMPRESSION: 5.5 x 7 cm mass lesion above the dome of the bladder with bladder invasion. There is also segmental thickening of the sigmoid colon. There is a fistulous communication between the mass in the sigmoid colon. Differential includes infection and tumor. I would favor tumor given the enhancing soft tissue mass lesion. This may be a urogenital neoplasm such is a bladder carcinoma or urachal carcinoma invading the  sigmoid colon. Colon cancer invading the bladder is also possible. Mildly prominent mesenteric lymph nodes could be due to metastatic disease. Small gas bubbles are present posterior to sigmoid colon suggesting contained perforation. Small amount of free fluid. Electronically Signed By: Franchot Gallo M.D. On: 10/17/2013 09:51   Review of Systems  Constitutional: Positive for weight loss (30 pounds over the last 1.5 months) and malaise/fatigue. Negative for fever, chills and diaphoresis.  HENT: Negative.  Eyes: Negative.  Respiratory: Negative.  Cardiovascular: Negative.  Gastrointestinal: Positive for abdominal pain, diarrhea, constipation (She has allot of constipation) and blood in stool. Negative for nausea and vomiting.  Genitourinary: Positive for dysuria, urgency, frequency, hematuria and flank pain.  Pneumaturia  Back and flank pain  Musculoskeletal: Positive for back pain.  Back and side  Skin: Positive for rash (new face rash that came on with infection).  Neurological: Negative. Negative for weakness.  Endo/Heme/Allergies: Positive for polydipsia.  Psychiatric/Behavioral: Negative.   Blood pressure 129/69, pulse 93, temperature 98.4 F (36.9 C), temperature source Oral, resp. rate 18, height _0  (1.549 m), weight 113.399 kg (250 lb), last menstrual period 10/14/2013, SpO2 100.00%.  Physical Exam  Constitutional: She is oriented to person, place, and time. She appears well-developed and well-nourished.  HENT:  Head: Normocephalic and atraumatic.  Nose: Nose normal.  Eyes: Conjunctivae and EOM are normal. Pupils are equal, round, and reactive to light. Right eye exhibits no discharge. Left eye exhibits no discharge. No scleral icterus.  Neck: Normal range of motion. Neck supple. No JVD present. No tracheal deviation present. No thyromegaly present.  Cardiovascular: Normal rate, regular rhythm, normal heart sounds and intact distal pulses. Exam reveals no gallop.  No murmur  heard.  Respiratory: Effort normal and breath sounds normal. No respiratory distress. She has no wheezes. She has no rales. She exhibits no tenderness.  GI: Soft. Bowel sounds are normal. She exhibits no distension and no mass. There is tenderness (Primary discomfort  is RLQ, she is sore if you palpate the left, but main discomfort is RLQ.). There is no rebound and no guarding.  Musculoskeletal: She exhibits no edema.  Lymphadenopathy:  She has no cervical adenopathy.  Neurological: She is alert and oriented to person, place, and time. No cranial nerve deficit.  Skin: Skin is warm and dry. No pallor.  Psychiatric: She has a normal mood and affect. Her behavior is normal. Judgment and thought content normal.   Assessment/Plan  1. 5.5 x 7 cm mass above the dome of the bladder, with invasion into the bladder. Fistula from the sigmoid colon.  2. Bladder cancer with possible invasion of the colon, or colon cancer with invasion into the bladder.  3. UTI  4. Weight loss 5. BMI 47 6.  Hx of tobacco use    Plan: Agree with admit, Cipro/flagyl to start rx of UTI/Diverticulitis. Urology will see and hopefully can do a cystoscopy and get a biopsy. We will follow with you.  Earnstine Regal  10/17/2013, 10:50 AM

## 2013-10-17 NOTE — ED Notes (Signed)
Patient transported to CT 

## 2013-10-17 NOTE — ED Notes (Signed)
Pt states she is having issues with a recurrent UTI  Pt states this time she has blood clots in her urine, no appetite, weight loss, the only way she can get a full stream of urine is if she takes a muscle relaxer, and pt is c/o lower back, mid back, flank pain, leg pain and pain across the front of her abdomen  Pt states she had a UTI in Jan that turned into a double kidney infection and was started on bactrim  Pt states that caused her to be unclear mentally  Pt states she was kept 2 days then sent home  Pt states she has been hurting ever since

## 2013-10-17 NOTE — Progress Notes (Signed)
[facility] Stacy, provided pt with a list of primary care resources. Patient stated that she was pending insurance thorough ACA.

## 2013-10-18 ENCOUNTER — Other Ambulatory Visit: Payer: Self-pay | Admitting: Urology

## 2013-10-18 DIAGNOSIS — R634 Abnormal weight loss: Secondary | ICD-10-CM

## 2013-10-18 DIAGNOSIS — D509 Iron deficiency anemia, unspecified: Secondary | ICD-10-CM

## 2013-10-18 DIAGNOSIS — K5732 Diverticulitis of large intestine without perforation or abscess without bleeding: Secondary | ICD-10-CM

## 2013-10-18 LAB — URINE CULTURE
Colony Count: NO GROWTH
Culture: NO GROWTH

## 2013-10-18 LAB — CBC
HCT: 26.2 % — ABNORMAL LOW (ref 36.0–46.0)
HCT: 25.8 % — ABNORMAL LOW (ref 36.0–46.0)
Hemoglobin: 7.9 g/dL — ABNORMAL LOW (ref 12.0–15.0)
Hemoglobin: 8.1 g/dL — ABNORMAL LOW (ref 12.0–15.0)
MCH: 20.9 pg — ABNORMAL LOW (ref 26.0–34.0)
MCH: 21.4 pg — ABNORMAL LOW (ref 26.0–34.0)
MCHC: 30.2 g/dL (ref 30.0–36.0)
MCHC: 31.4 g/dL (ref 30.0–36.0)
MCV: 69.3 fL — ABNORMAL LOW (ref 78.0–100.0)
MCV: 68.3 fL — ABNORMAL LOW (ref 78.0–100.0)
Platelets: 328 10*3/uL (ref 150–400)
Platelets: 367 10*3/uL (ref 150–400)
RBC: 3.78 MIL/uL — ABNORMAL LOW (ref 3.87–5.11)
RBC: 3.78 MIL/uL — ABNORMAL LOW (ref 3.87–5.11)
RDW: 20.8 % — ABNORMAL HIGH (ref 11.5–15.5)
RDW: 20.8 % — ABNORMAL HIGH (ref 11.5–15.5)
WBC: 5.3 10*3/uL (ref 4.0–10.5)
WBC: 5.1 10*3/uL (ref 4.0–10.5)

## 2013-10-18 LAB — TYPE AND SCREEN
ABO/RH(D): O POS
Antibody Screen: NEGATIVE

## 2013-10-18 LAB — ABO/RH: ABO/RH(D): O POS

## 2013-10-18 LAB — BASIC METABOLIC PANEL
BUN: 5 mg/dL — ABNORMAL LOW (ref 6–23)
CO2: 23 mEq/L (ref 19–32)
Calcium: 8.4 mg/dL (ref 8.4–10.5)
Chloride: 105 mEq/L (ref 96–112)
Creatinine, Ser: 0.7 mg/dL (ref 0.50–1.10)
GFR calc Af Amer: >90 mL/min (ref >90)
GFR calc non Af Amer: >90 mL/min (ref >90)
Glucose, Bld: 98 mg/dL (ref 70–99)
Potassium: 3.8 mEq/L (ref 3.7–5.3)
Sodium: 138 mEq/L (ref 137–147)

## 2013-10-18 LAB — AFP TUMOR MARKER: AFP-Tumor Marker: 1.9 ng/mL (ref 0.0–8.0)

## 2013-10-18 LAB — CEA: CEA: 7.8 ng/mL — ABNORMAL HIGH (ref 0.0–5.0)

## 2013-10-18 LAB — CA 125: CA 125: 44 U/mL — ABNORMAL HIGH (ref 0.0–30.2)

## 2013-10-18 LAB — OCCULT BLOOD X 1 CARD TO LAB, STOOL: Fecal Occult Bld: POSITIVE — AB

## 2013-10-18 MED ORDER — POLYETHYLENE GLYCOL 3350 17 G PO PACK
17.0000 g | PACK | Freq: Two times a day (BID) | ORAL | Status: DC
  Administered 2013-10-18 – 2013-10-28 (×17): 17 g via ORAL
  Filled 2013-10-18 (×22): qty 1

## 2013-10-18 MED ORDER — PANTOPRAZOLE SODIUM 20 MG PO TBEC
20.0000 mg | DELAYED_RELEASE_TABLET | Freq: Every day | ORAL | Status: DC
  Administered 2013-10-18 – 2013-10-28 (×10): 20 mg via ORAL
  Filled 2013-10-18 (×11): qty 1

## 2013-10-18 MED ORDER — BOOST / RESOURCE BREEZE PO LIQD
1.0000 | Freq: Three times a day (TID) | ORAL | Status: DC
  Administered 2013-10-21 – 2013-10-28 (×14): 1 via ORAL

## 2013-10-18 MED ORDER — ALUM & MAG HYDROXIDE-SIMETH 200-200-20 MG/5ML PO SUSP
30.0000 mL | ORAL | Status: DC | PRN
  Administered 2013-10-18: 30 mL via ORAL
  Filled 2013-10-18: qty 30

## 2013-10-18 NOTE — Progress Notes (Addendum)
Progress Note   Amy Bentley IEP:[id number] DOB: 11/22/1970 DOA: 10/17/2013 PCP: [name], MD   Brief Narrative:   Amy Bentley is an 43 y.o. female with a PMH of recurrent UTI was admitted 10/17/13 with abdominal pain, hematuria and weight loss of 30 pounds over the past one-2 months. Upon initial evaluation in the ED, the patient was noted to have hematuria, and a CT scan of the abdomen/pelvis showed a 5.5 X7 centimeter mass lesion above the dome of the bladder with bladder invasion. There was also sigmoid colon thickening and a possible contained microperforation.  Assessment/Plan:   Principal Problem:   Probable bladder cancer  Fistula from the sigmoid colon.   Etiology is not very clear at this time, diverticulitis vs cancer, secondary colo-vesicle fistula?   Seen by surgery with recommendations to treat with empiric antibiotics and obtain urology evaluation with possible plans for abdominal exploratory surgery.  Seen by urologist plans for bedside cystoscopy, likely 10/19/13.  Followup CEA, CA 125, and AFP. Followup urine culture. Active Problems:   Weight loss / morbid obesity  We'll ask dietitian to evaluate.   Microcytic anemia  Likely iron deficiency from chronic GI versus GU blood loss and menstrual losses.   Diverticulitis of colon (without mention of hemorrhage)  Continue Cipro/Flagyl.   DVT Prophylaxis  Continue Lovenox.  Code Status: Full. Family Communication: No family at bedside. Disposition Plan: Home when stable.   IV Access:    Peripheral IV   Procedures:    None.   Medical Consultants:    Dr. Carolan Clines, Urology  Dr. Stark Klein, Surgery  Other Consultants:    Dietitian   Anti-Infectives:    Cipro 10/17/13--->  Flagyl 10/17/13--->  Subjective:   Amy Bentley continues to have 5/10 lower abdominal pain, occasional nausea but no vomiting, mild hematuria. Afebrile overnight.  Objective:      Filed Vitals:   10/17/13 1149 10/17/13 1400 10/17/13 2122 10/18/13 0506  BP: 149/87 124/80 121/68 114/62  Pulse: 108 108 107 79  Temp: 98.7 F (37.1 C) 98.6 F (37 C) 98.7 F (37.1 C) 98.6 F (37 C)  TempSrc: Oral Oral Oral Oral  Resp: 18 18 16 15   Height: 5\' 1"  (1.549 m)     Weight: 113.399 kg (250 lb)     SpO2: 100% 100% 99% 98%    Intake/Output Summary (Last 24 hours) at 10/18/13 1123 Last data filed at 10/18/13 1057  Gross per 24 hour  Intake 1029.17 ml  Output    650 ml  Net 379.17 ml    Exam: Gen:  NAD Cardiovascular:  RRR, No M/R/G Respiratory:  Lungs CTAB Gastrointestinal:  Abdomen soft, NT/ND, + BS Extremities:  No C/E/C   Data Reviewed:    Labs: Basic Metabolic Panel:  Recent Labs Lab 10/17/13 0650 10/18/13 0320  NA 140 138  K 3.8 3.8  CL 102 105  CO2 24 23  GLUCOSE 110* 98  BUN 6 5*  CREATININE 0.77 0.70  CALCIUM 9.6 8.4   GFR Estimated Creatinine Clearance: 109.2 ml/min (by C-G formula based on Cr of 0.7). Liver Function Tests:  Recent Labs Lab 10/17/13 0650  AST 14  ALT 9  ALKPHOS 94  BILITOT 0.3  PROT 7.4  ALBUMIN 3.3*   microcytic anemi CBC:  Recent Labs Lab 10/17/13 0650 10/18/13 0320 10/18/13 0946  WBC 8.6 5.3 5.1  NEUTROABS 6.5  --   --   HGB 9.3* 7.9* 8.1*  HCT 29.7* 26.2* 25.8*  MCV 67.8* 69.3* 68.3*  PLT 407* 328 367   Sepsis Labs:  Recent Labs Lab 10/17/13 0650 10/18/13 0320 10/18/13 0946  WBC 8.6 5.3 5.1   Microbiology No results found for this or any previous visit (from the past 240 hour(s)).   Radiographs/Studies:   Ct Abdomen Pelvis Wo Contrast  10/17/2013   CLINICAL DATA:  Right flank pain and hematuria. Dysuria. Recent UTI  EXAM: CT ABDOMEN AND PELVIS WITHOUT CONTRAST  TECHNIQUE: Multidetector CT imaging of the abdomen and pelvis was performed following the standard protocol without intravenous contrast.  COMPARISON:  None.  FINDINGS: Lung bases are clear. Unenhanced images of the liver  gallbladder and bile ducts are normal. Pancreas and spleen are normal.  The kidneys show no obstruction or mass.  No urinary tract calculi.  Thickening of the sigmoid colon just above the urinary bladder. There is stranding in the pericolonic fat. There is a soft tissue structure above the bladder and below the sigmoid thickening containing gas bubbles, worrisome for abscess. This structure measures 6.5 x 5.8 cm. No free air or free fluid is identified. Urinary bladder appears to be empty and presumably is below this abscess. Further evaluation with oral and IV contrast is suggested.  Appendix is normal.  No acute bony change.  IMPRESSION: Negative for urinary tract calculi.  Thickening of the sigmoid colon consistent with diverticulitis. Just below the thickened sigmoid colon, there is a soft tissue mass containing gas bubbles, worrisome for abscess. Further evaluation with oral and IV contrast is suggested to confirm these findings.  I discussed the findings with Dr. Tamera Punt.   Electronically Signed   By: Franchot Gallo M.D.   On: 10/17/2013 07:39   Ct Abdomen Pelvis W Contrast  10/17/2013   CLINICAL DATA:  Pelvic pain with dysuria and hematuria. Rule out diverticulitis or tumor.  EXAM: CT ABDOMEN AND PELVIS WITH CONTRAST  TECHNIQUE: Multidetector CT imaging of the abdomen and pelvis was performed using the standard protocol following bolus administration of intravenous contrast.  CONTRAST:  160mL OMNIPAQUE IOHEXOL 300 MG/ML  SOLN  COMPARISON:  CT abdomen without contrast earlier today  FINDINGS: Lung bases are clear. Liver gallbladder and bile ducts are normal. Pancreas and spleen are normal. No renal obstruction or renal mass.  Segmental thickening of the sigmoid colon is again noted in the midline. There is mild stranding in the pericolonic fat. Irregular soft tissue mass is present above the bladder with invasion of the dome of the bladder. Delayed images of the bladder reveal irregularity of the dome of the  bladder with apparent invasion by the mass lesion. The appearance is most consistent with tumor rather than infection. This mass contains enhancing soft tissue as well as central fluid and gas. The mass measures approximately 5.5 x 7 cm and is located in the midline. There is a small amount of gas in the bladder due to bowel communication. There is a fistulous tract between the sigmoid colon and the mass.  Several gas bubbles are present posterior to the sigmoid colon which may represent extra luminal gas. There is a small amount of free fluid. Small subcentimeter mesenteric lymph nodes are present which could be due to metastatic disease.  Negative for bowel obstruction. Appendix normal. Uterus is not enlarged and appears separate from the mass.  IMPRESSION: 5.5 x 7 cm mass lesion above the dome of the bladder with bladder invasion. There is also segmental thickening of the sigmoid colon. There is a fistulous communication between the  mass in the sigmoid colon. Differential includes infection and tumor. I would favor tumor given the enhancing soft tissue mass lesion. This may be a urogenital neoplasm such is a bladder carcinoma or urachal carcinoma invading the sigmoid colon. Colon cancer invading the bladder is also possible. Mildly prominent mesenteric lymph nodes could be due to metastatic disease. Small gas bubbles are present posterior to sigmoid colon suggesting contained perforation. Small amount of free fluid.   Electronically Signed   By: Franchot Gallo M.D.   On: 10/17/2013 09:51    Medications:   . ciprofloxacin  400 mg Intravenous Q12H  . enoxaparin (LOVENOX) injection  60 mg Subcutaneous Q24H  . metronidazole  500 mg Intravenous 3 times per day  . pneumococcal 23 valent vaccine  0.5 mL Intramuscular Tomorrow-1000   Continuous Infusions: . sodium chloride 50 mL/hr at 10/17/13 1149    Time spent: 35 minutes with > 50% of time discussing current diagnostic test results, clinical impression  and plan of care.    LOS: 1 day   [facility]  [facility] Pager [phone]. If unable to reach me by pager, please call my cell phone at [phone].  *Please refer to [email or web address], password [email or web address] to get updated schedule on who will round on this patient, as hospitalists switch teams weekly. If 7PM-7AM, please contact night-coverage at [email or web address], password [email or web address] for any overnight needs.  10/18/2013, 11:23 AM    **Disclaimer: This note was dictated with voice recognition software. Similar sounding words can inadvertently be transcribed and this note may contain transcription errors which may not have been corrected upon publication of note.**   Information printed out and given to the patient/family:     In an effort to keep you and your family informed about your hospital stay, I am providing you with this information sheet. If you or your family have any questions, please do not hesitate to have the nursing staff page me to set up a meeting time.  Amy Bentley 10/18/2013 1 (Number of days in the hospital)  Treatment team:  Dr. Jacquelynn Cree, Hospitalist (Internist)  Dr. Carolan Clines, Urology  Dr. Stark Klein, Surgery  Active Treatment Issues with Plan: Principal Problem:   Bladder mass  Seen by surgery with recommendations to treat with antibiotics and obtain urology evaluation with possible plans for abdominal exploratory surgery.  Seen by urologist plans for bedside cystoscopy (procedure that allows direct visualization of the inside of the bladder with biopsies if needed), likely 10/19/13. Active Problems: Weight loss  We'll ask dietitian to evaluate your nutritional status.   Low red blood cell count (anemia)  Likely iron deficiency from chronic blood loss in the urine, possibly the stool, and from your periods.   Diverticulitis of colon  Continue antibiotics (Cipro/Flagyl).   Blood clot prevention  Continue Lovenox, which is a medicine  given under the skin by injection.  Anticipated discharge date: 2-3 days.

## 2013-10-18 NOTE — Progress Notes (Signed)
Cysto tomorrow.  Will probably plan repair/resection Friday depending on urology.    Will check CEA on her.    Will get ostomy marking in case she needs ostomy.

## 2013-10-18 NOTE — Progress Notes (Signed)
INITIAL NUTRITION ASSESSMENT  DOCUMENTATION CODES Per approved criteria  -Severe malnutrition in the context of chronic illness -Morbid Obesity  Pt meets criteria for severe MALNUTRITION in the context of chronic illness as evidenced by 10% weight loss in 2-3 months, PO intake <75% for > one month.   INTERVENTION: -Recommend Resource Breeze po TID, each supplement provides 250 kcal and 9 grams of protein -Modify supplement with diet advancement -Diet advancement per MD-may benefit from low fiber diet -Will continue to monitor  NUTRITION DIAGNOSIS: Unintentional wt loss related to chronic disease/inadequate energy intake as evidenced by 30 lb wt loss in 2-3 months.   Goal: Pt to meet >/= 90% of their estimated nutrition needs    Monitor:  Total protein/energy intake, labs, weights, diet order, Gi profile  Reason for Assessment: Consult/MST  43 y.o. female  Admitting Dx: Bladder cancer  ASSESSMENT: Pt is 43 yo female with recurrent UTI, presenting to [facility] ED with main concern several months duration of progressively worsening lower quadrant abdominal pain, throbbing and intermittent,5/10 in severity when present, occasionally but not consistently radiating to the lower back area, right > left thigh area.  -Pt reported unintentional wt loss of 30 lbs in past 2-3 months -Diet recall indicated pt would sometimes skip entire day of meals d/t abd pain -Pt would normally eat yogurt/banana/granola bar for breakfast, sandwich or instant noodles for lunch, and bland foods (baked chicken/baked potato) for dinner -Had significant abd pain after eating fried/greasy foods. Was also unable to tolerate cruciferous/gas causing vegetables such as kale, broccoli, and cabbage -Currently on clear liquid diet. Consumed chicken broth/jello/juice. Denied abd pain or nausea post PO intake -MD noted pt with probable bladder cancer, and diverticulitis of colon. May benefit from advancing to low fiber as  diet advancement tolerated. Will monitor diet education needs -Pt to undergo bedside cytoscopy on 4/23 -Was willing to trial Resource Breeze for additional nutrition while on clear liquid diet -Will discuss additional supplement for weight maintenance (Ensure/Boost, etc) as tolerated    Height: Ht Readings from Last 1 Encounters:  10/17/13 5\' 1"  (1.549 m)    Weight: Wt Readings from Last 1 Encounters:  10/17/13 250 lb (113.399 kg)    Ideal Body Weight: 105 lbs  % Ideal Body Weight: 238 lbs  Wt Readings from Last 10 Encounters:  10/17/13 250 lb (113.399 kg)  08/07/13 253 lb 4.9 oz (114.9 kg)  07/29/13 270 lb (122.471 kg)    Usual Body Weight: 280 lbs  % Usual Body Weight: 89%  BMI:  Body mass index is 47.26 kg/(m^2). Morbid Obesity  Estimated Nutritional Needs: Kcal: 1800-2000 Protein:100-110 gram Fluid: >/=1700 ml/daily  Skin: WDL, no edema  Diet Order: Clear Liquid  EDUCATION NEEDS: -Education not appropriate at this time   Intake/Output Summary (Last 24 hours) at 10/18/13 1400 Last data filed at 10/18/13 1057  Gross per 24 hour  Intake 909.17 ml  Output    650 ml  Net 259.17 ml    Last BM: 4/20   Labs:   Recent Labs Lab 10/17/13 0650 10/18/13 0320  NA 140 138  K 3.8 3.8  CL 102 105  CO2 24 23  BUN 6 5*  CREATININE 0.77 0.70  CALCIUM 9.6 8.4  GLUCOSE 110* 98    CBG (last 3)  No results found for this basename: GLUCAP,  in the last 72 hours  Scheduled Meds: . ciprofloxacin  400 mg Intravenous Q12H  . enoxaparin (LOVENOX) injection  60 mg Subcutaneous Q24H  .  metronidazole  500 mg Intravenous 3 times per day  . pantoprazole  20 mg Oral Daily  . pneumococcal 23 valent vaccine  0.5 mL Intramuscular Tomorrow-1000  . polyethylene glycol  17 g Oral BID    Continuous Infusions: . sodium chloride 50 mL/hr at 10/17/13 1149    History reviewed. No pertinent past medical history.  Past Surgical History  Procedure Laterality Date  .  Cesarean section      x 2    Atlee Abide MS RD LDN Clinical Dietitian [phone]

## 2013-10-18 NOTE — Progress Notes (Signed)
Subjective: She feels much better, pain is better, voiding is better, her appetite is back.    Objective: Vital signs in last 24 hours: Temp:  [98.4 F (36.9 C)-98.7 F (37.1 C)] 98.6 F (37 C) (04/22 0506) Pulse Rate:  [79-108] 79 (04/22 0506) Resp:  [15-18] 15 (04/22 0506) BP: (114-149)/(62-87) 114/62 mmHg (04/22 0506) SpO2:  [98 %-100 %] 98 % (04/22 0506) Weight:  [113.399 kg (250 lb)] 113.399 kg (250 lb) (04/21 1149) Last BM Date: 10/16/13 480 po RECORDED. Regular diet 1 bm Afebrile, VSS BMP OK, H/H down from 9.3 to 7.9 this AM 300 urine output recorded Intake/Output from previous day: 04/21 0701 - 04/22 0700 In: 789.2 [P.O.:480; I.V.:309.2] Out: 300 [Urine:300] Intake/Output this shift:    General appearance: alert, cooperative and no distress Resp: clear to auscultation bilaterally GI: soft, sore in the lower abdomen.  + BM.  Lab Results:   Recent Labs  10/17/13 0650 10/18/13 0320  WBC 8.6 5.3  HGB 9.3* 7.9*  HCT 29.7* 26.2*  PLT 407* 328    BMET  Recent Labs  10/17/13 0650 10/18/13 0320  NA 140 138  K 3.8 3.8  CL 102 105  CO2 24 23  GLUCOSE 110* 98  BUN 6 5*  CREATININE 0.77 0.70  CALCIUM 9.6 8.4   PT/INR No results found for this basename: LABPROT, INR,  in the last 72 hours   Recent Labs Lab 10/17/13 0650  AST 14  ALT 9  ALKPHOS 94  BILITOT 0.3  PROT 7.4  ALBUMIN 3.3*     Lipase  No results found for this basename: lipase     Studies/Results: Ct Abdomen Pelvis Wo Contrast  10/17/2013   CLINICAL DATA:  Right flank pain and hematuria. Dysuria. Recent UTI  EXAM: CT ABDOMEN AND PELVIS WITHOUT CONTRAST  TECHNIQUE: Multidetector CT imaging of the abdomen and pelvis was performed following the standard protocol without intravenous contrast.  COMPARISON:  None.  FINDINGS: Lung bases are clear. Unenhanced images of the liver gallbladder and bile ducts are normal. Pancreas and spleen are normal.  The kidneys show no obstruction or  mass.  No urinary tract calculi.  Thickening of the sigmoid colon just above the urinary bladder. There is stranding in the pericolonic fat. There is a soft tissue structure above the bladder and below the sigmoid thickening containing gas bubbles, worrisome for abscess. This structure measures 6.5 x 5.8 cm. No free air or free fluid is identified. Urinary bladder appears to be empty and presumably is below this abscess. Further evaluation with oral and IV contrast is suggested.  Appendix is normal.  No acute bony change.  IMPRESSION: Negative for urinary tract calculi.  Thickening of the sigmoid colon consistent with diverticulitis. Just below the thickened sigmoid colon, there is a soft tissue mass containing gas bubbles, worrisome for abscess. Further evaluation with oral and IV contrast is suggested to confirm these findings.  I discussed the findings with Dr. Tamera Punt.   Electronically Signed   By: Franchot Gallo M.D.   On: 10/17/2013 07:39   Ct Abdomen Pelvis W Contrast  10/17/2013   CLINICAL DATA:  Pelvic pain with dysuria and hematuria. Rule out diverticulitis or tumor.  EXAM: CT ABDOMEN AND PELVIS WITH CONTRAST  TECHNIQUE: Multidetector CT imaging of the abdomen and pelvis was performed using the standard protocol following bolus administration of intravenous contrast.  CONTRAST:  15mL OMNIPAQUE IOHEXOL 300 MG/ML  SOLN  COMPARISON:  CT abdomen without contrast earlier today  FINDINGS:  Lung bases are clear. Liver gallbladder and bile ducts are normal. Pancreas and spleen are normal. No renal obstruction or renal mass.  Segmental thickening of the sigmoid colon is again noted in the midline. There is mild stranding in the pericolonic fat. Irregular soft tissue mass is present above the bladder with invasion of the dome of the bladder. Delayed images of the bladder reveal irregularity of the dome of the bladder with apparent invasion by the mass lesion. The appearance is most consistent with tumor rather than  infection. This mass contains enhancing soft tissue as well as central fluid and gas. The mass measures approximately 5.5 x 7 cm and is located in the midline. There is a small amount of gas in the bladder due to bowel communication. There is a fistulous tract between the sigmoid colon and the mass.  Several gas bubbles are present posterior to the sigmoid colon which may represent extra luminal gas. There is a small amount of free fluid. Small subcentimeter mesenteric lymph nodes are present which could be due to metastatic disease.  Negative for bowel obstruction. Appendix normal. Uterus is not enlarged and appears separate from the mass.  IMPRESSION: 5.5 x 7 cm mass lesion above the dome of the bladder with bladder invasion. There is also segmental thickening of the sigmoid colon. There is a fistulous communication between the mass in the sigmoid colon. Differential includes infection and tumor. I would favor tumor given the enhancing soft tissue mass lesion. This may be a urogenital neoplasm such is a bladder carcinoma or urachal carcinoma invading the sigmoid colon. Colon cancer invading the bladder is also possible. Mildly prominent mesenteric lymph nodes could be due to metastatic disease. Small gas bubbles are present posterior to sigmoid colon suggesting contained perforation. Small amount of free fluid.   Electronically Signed   By: Franchot Gallo M.D.   On: 10/17/2013 09:51    Medications: . ciprofloxacin  400 mg Intravenous Q12H  . enoxaparin (LOVENOX) injection  60 mg Subcutaneous Q24H  . metronidazole  500 mg Intravenous 3 times per day  . pneumococcal 23 valent vaccine  0.5 mL Intramuscular Tomorrow-1000    Assessment/Plan 1. 5.5 x 7 cm mass above the dome of the bladder, with invasion into the bladder. Fistula from the sigmoid colon.  2. Bladder cancer with possible invasion of the colon, or colon cancer with invasion into the bladder.  3. UTI  4. Weight loss  5. BMI 47  6. Hx of  tobacco use  7.  H/H is down   Plan:  I will discuss with Dr. [name].  We will await cystoscopy, and bx.  Watch H/H.  I will recheck this AM, along with type and screen.  Check stools for blood, We will need to do a bowel prep and switch her to liquids before any surgery. I have orded CEA and AFP.    LOS: 1 day    Amy Bentley 10/18/2013

## 2013-10-19 DIAGNOSIS — E43 Unspecified severe protein-calorie malnutrition: Secondary | ICD-10-CM | POA: Diagnosis present

## 2013-10-19 LAB — FERRITIN: Ferritin: 32 ng/mL (ref 10–291)

## 2013-10-19 LAB — OCCULT BLOOD X 1 CARD TO LAB, STOOL: Fecal Occult Bld: POSITIVE — AB

## 2013-10-19 LAB — CBC
HCT: 27.4 % — ABNORMAL LOW (ref 36.0–46.0)
Hemoglobin: 8.1 g/dL — ABNORMAL LOW (ref 12.0–15.0)
MCH: 20.8 pg — ABNORMAL LOW (ref 26.0–34.0)
MCHC: 29.6 g/dL — ABNORMAL LOW (ref 30.0–36.0)
MCV: 70.4 fL — ABNORMAL LOW (ref 78.0–100.0)
Platelets: 365 10*3/uL (ref 150–400)
RBC: 3.89 MIL/uL (ref 3.87–5.11)
RDW: 20.9 % — ABNORMAL HIGH (ref 11.5–15.5)
WBC: 8.2 10*3/uL (ref 4.0–10.5)

## 2013-10-19 LAB — IRON AND TIBC
Iron: 11 ug/dL — ABNORMAL LOW (ref 42–135)
Saturation Ratios: 4 % — ABNORMAL LOW (ref 20–55)
TIBC: 254 ug/dL (ref 250–470)
UIBC: 243 ug/dL (ref 125–400)

## 2013-10-19 LAB — HEMOGLOBIN AND HEMATOCRIT, BLOOD
HCT: 28.5 % — ABNORMAL LOW (ref 36.0–46.0)
Hemoglobin: 8.7 g/dL — ABNORMAL LOW (ref 12.0–15.0)

## 2013-10-19 MED ORDER — OXYCODONE-ACETAMINOPHEN 5-325 MG PO TABS
2.0000 | ORAL_TABLET | ORAL | Status: DC | PRN
  Administered 2013-10-19 – 2013-10-26 (×9): 2 via ORAL
  Administered 2013-10-27: 1 via ORAL
  Administered 2013-10-27 – 2013-10-28 (×3): 2 via ORAL
  Filled 2013-10-19 (×13): qty 2

## 2013-10-19 MED ORDER — POLYSACCHARIDE IRON COMPLEX 150 MG PO CAPS
150.0000 mg | ORAL_CAPSULE | Freq: Every day | ORAL | Status: DC
  Administered 2013-10-19 – 2013-10-28 (×9): 150 mg via ORAL
  Filled 2013-10-19 (×10): qty 1

## 2013-10-19 NOTE — Progress Notes (Signed)
Patient ID: Amy Bentley, female   DOB: 1971/05/31, 43 y.o.   MRN: [id number]    Subjective: Continues to feel better.  Cysto was planned today.    Objective: Vital signs in last 24 hours: Temp:  [98.7 F (37.1 C)-99.6 F (37.6 C)] 99.6 F (37.6 C) (04/23 1445) Pulse Rate:  [85-94] 94 (04/23 1445) Resp:  [16-18] 18 (04/23 1445) BP: (104-122)/(61-76) 108/61 mmHg (04/23 1445) SpO2:  [98 %-100 %] 100 % (04/23 1445) Last BM Date: 10/18/13 480 po RECORDED. Regular diet 1 bm Afebrile, VSS BMP OK, H/H down from 9.3 to 7.9 this AM 300 urine output recorded Intake/Output from previous day: 04/22 0701 - 04/23 0700 In: 720 [P.O.:720] Out: 1650 [Urine:1650] Intake/Output this shift: Total I/O In: 240 [P.O.:240] Out: 200 [Urine:200]  General appearance: alert, cooperative and no distress Resp: clear to auscultation bilaterally GI: soft, sore in the lower abdomen.  + BM.  Lab Results:   Recent Labs  10/18/13 0946 10/19/13 0355 10/19/13 1140  WBC 5.1 8.2  --   HGB 8.1* 8.1* 8.7*  HCT 25.8* 27.4* 28.5*  PLT 367 365  --     BMET  Recent Labs  10/17/13 0650 10/18/13 0320  NA 140 138  K 3.8 3.8  CL 102 105  CO2 24 23  GLUCOSE 110* 98  BUN 6 5*  CREATININE 0.77 0.70  CALCIUM 9.6 8.4   PT/INR No results found for this basename: LABPROT, INR,  in the last 72 hours   Recent Labs Lab 10/17/13 0650  AST 14  ALT 9  ALKPHOS 94  BILITOT 0.3  PROT 7.4  ALBUMIN 3.3*     Lipase  No results found for this basename: lipase     Studies/Results: No results found.  Medications: . ciprofloxacin  400 mg Intravenous Q12H  . feeding supplement (RESOURCE BREEZE)  1 Container Oral TID WC  . iron polysaccharides  150 mg Oral Daily  . metronidazole  500 mg Intravenous 3 times per day  . pantoprazole  20 mg Oral Daily  . pneumococcal 23 valent vaccine  0.5 mL Intramuscular Tomorrow-1000  . polyethylene glycol  17 g Oral BID    Assessment/Plan 1. 5.5 x 7 cm mass  above the dome of the bladder, with invasion into the bladder. Fistula from the sigmoid colon.  2. Bladder cancer with possible invasion of the colon, or colon cancer with invasion into the bladder.  3. UTI  4. Weight loss  5. BMI 47  6. Hx of tobacco use  7.  H/H is down  Probable ex lap tomorrow with sigmoid resection/partial bladder resection/left stent/probable colostomy.  Suspicious for colon cancer, but may be all diverticulitis.    Reviewed risks and benefits with patient. May have to cancel if emergent cases bump.      LOS: 2 days    Stark Klein 10/19/2013

## 2013-10-19 NOTE — Consult Note (Signed)
WOC ostomy consult note Stoma type/location:  Marking for possible colostomy Education provided:  Patient is awaiting cystoscopy this AM.  Spoke with Judith Part, PA who indicated to mark for possible colostomy at this time.  Patient resting quietly in bed.  Explained that further testing was to take place this AM, but I would be marking her for colostomy in case this was needed. She understood that course of treatment was still unfolding as diagnostic studies were completed.  Patient is evaluated in the sitting, standing, bending and reaching.  Skin fold at umbilicus and pannus present. Located area within the rectus muscle and within patient's field of vision. Educational booklet given to patient to read.  Patient is anxious and hoping she does not require stoma placement.  Assured that the [place] team would provide education and opportunity to learn self- care if needed.  Stoma is marked 4 cm down and 4 cm left of umbilicus.  [place] team will continue to follow along with you.  Domenic Moras RN BSN [place] Pager [phone]

## 2013-10-19 NOTE — Procedures (Signed)
Preoperative diagnosis:  1. Bladder mass   Postoperative diagnosis:  1. Inflammatory mass consistent with colovesical fistula   Procedure: 1. Bedside flexible cystoscopy  Surgeon: Ardis Hughs, MD Assistant: Dr. Katrine Coho, MD  Anesthesia: none  Complications: None  Intraoperative findings: 2-3cm inflammatory mass back wall of bladder/dome area with surrounding bullous edema.  EBL: Minimal   Specimens: None  Indication: Amy Bentley is a 43 y.o. patient with fecal-uria, recurrent UTIs with bladder mass seen on CT scan.  Flexible cystoscopy was needed for more definitive diagnosis and operative planning. Description of procedure:  Description: Patient was consented,placed in the frog leg position and prepped in the routine sterile fashion.  A 15F flexible cystoscope was placed into the patient's bladder with the above findings.  The urethra was normal.  The lesion is well away from the ureters.  Once I had gotten a good look at the lesion I removed the cystoscope.  The patient tolerated the procedure well..  Plan: Will coordinate with general surgery to perform partial cystectomy to remove the lesion at the time of her ex.lap/ bowel resection.  Ardis Hughs, M.D.

## 2013-10-19 NOTE — Progress Notes (Signed)
Progress Note   Jaala Bohle EYC:[id number] DOB: Jul 23, 1970 DOA: 10/17/2013 PCP: [name], MD   Brief Narrative:   Dorothia Passmore is an 43 y.o. female with a PMH of recurrent UTI was admitted 10/17/13 with abdominal pain, hematuria and weight loss of 30 pounds over the past one-2 months. Upon initial evaluation in the ED, the patient was noted to have hematuria, and a CT scan of the abdomen/pelvis showed a 5.5 X7 centimeter mass lesion above the dome of the bladder with bladder invasion. There was also sigmoid colon thickening and a possible contained microperforation.  Assessment/Plan:   Principal Problem:   Probable bladder cancer  CEA 125 high at 44, CEA high at 7.8, AFP WNL at 1.9. Urine culture negative, but colo-vesicle fistula is present.  Seen by surgery with recommendations to treat with empiric antibiotics and obtain urology evaluation with possible plans for abdominal exploratory surgery.  Seen by urologist plans for bedside cystoscopy, which was done earlier today.   Surgical resection of bladder mass and repair of fistula with possible resection of colon tentatively scheduled for 10/20/13. Active Problems:   Severe protein calorie malnutrition / Weight loss / morbid obesity  Has had 10% weight loss over the past 2-3 months and oral intake of less than 75% for greater than one month.  Seen by dietitian. Continue supplements per dietitian recommendations.   Microcytic anemia / Rectal bleeding  Likely iron deficiency from chronic GI with GU blood loss and menstrual losses all contributory.  Monitor hemoglobin and start on iron supplements.   Diverticulitis of colon (without mention of hemorrhage)  Continue Cipro/Flagyl.   DVT Prophylaxis  Stop Lovenox given anemia, rectal bleeding.  Use SCDs.  Code Status: Full. Family Communication: Husband updated at bedside. Disposition Plan: Home when stable.   IV Access:    Peripheral  IV   Procedures:    None.   Medical Consultants:    Dr. Carolan Clines, Urology  Dr. Stark Klein, Surgery  Other Consultants:    Dietitian   Anti-Infectives:    Cipro 10/17/13--->  Flagyl 10/17/13--->  Subjective:   Britne Borelli continues to have occasional lower abdominal pain, but no nausea or vomiting. Afebrile overnight. Nursing staff reports frankly bloody stools the patient continues to have blood-tinged urine.  Objective:    Filed Vitals:   10/18/13 0506 10/18/13 1454 10/18/13 2127 10/19/13 0534  BP: 114/62 122/76 108/62 104/64  Pulse: 79 87 85 85  Temp: 98.6 F (37 C) 98.7 F (37.1 C) 99.2 F (37.3 C) 99.2 F (37.3 C)  TempSrc: Oral Oral Oral Oral  Resp: 15 17 16 18   Height:      Weight:      SpO2: 98% 100% 99% 98%    Intake/Output Summary (Last 24 hours) at 10/19/13 0742 Last data filed at 10/19/13 0535  Gross per 24 hour  Intake    720 ml  Output   1650 ml  Net   -930 ml    Exam: Gen:  NAD Cardiovascular:  RRR, No M/R/G Respiratory:  Lungs CTAB Gastrointestinal:  Abdomen soft, NT/ND, + BS Extremities:  No C/E/C   Data Reviewed:    Labs: Basic Metabolic Panel:  Recent Labs Lab 10/17/13 0650 10/18/13 0320  NA 140 138  K 3.8 3.8  CL 102 105  CO2 24 23  GLUCOSE 110* 98  BUN 6 5*  CREATININE 0.77 0.70  CALCIUM 9.6 8.4   GFR Estimated Creatinine Clearance: 109.2 ml/min (by C-G formula based  on Cr of 0.7). Liver Function Tests:  Recent Labs Lab 10/17/13 0650  AST 14  ALT 9  ALKPHOS 94  BILITOT 0.3  PROT 7.4  ALBUMIN 3.3*   microcytic anemi CBC:  Recent Labs Lab 10/17/13 0650 10/18/13 0320 10/18/13 0946 10/19/13 0355  WBC 8.6 5.3 5.1 8.2  NEUTROABS 6.5  --   --   --   HGB 9.3* 7.9* 8.1* 8.1*  HCT 29.7* 26.2* 25.8* 27.4*  MCV 67.8* 69.3* 68.3* 70.4*  PLT 407* 328 367 365   Sepsis Labs:  Recent Labs Lab 10/17/13 0650 10/18/13 0320 10/18/13 0946 10/19/13 0355  WBC 8.6 5.3 5.1 8.2    Microbiology Recent Results (from the past 240 hour(s))  URINE CULTURE     Status: None   Collection Time    10/17/13  9:35 PM      Result Value Ref Range Status   Specimen Description URINE, CLEAN CATCH   Final   Special Requests NONE   Final   Culture  Setup Time     Final   Value: 10/18/2013 00:32     Performed at [facility]     Final   Value: NO GROWTH     Performed at [facility]   Culture     Final   Value: NO GROWTH     Performed at [facility]   Report Status 10/18/2013 FINAL   Final     Radiographs/Studies:   Ct Abdomen Pelvis Wo Contrast  10/17/2013   CLINICAL DATA:  Right flank pain and hematuria. Dysuria. Recent UTI  EXAM: CT ABDOMEN AND PELVIS WITHOUT CONTRAST  TECHNIQUE: Multidetector CT imaging of the abdomen and pelvis was performed following the standard protocol without intravenous contrast.  COMPARISON:  None.  FINDINGS: Lung bases are clear. Unenhanced images of the liver gallbladder and bile ducts are normal. Pancreas and spleen are normal.  The kidneys show no obstruction or mass.  No urinary tract calculi.  Thickening of the sigmoid colon just above the urinary bladder. There is stranding in the pericolonic fat. There is a soft tissue structure above the bladder and below the sigmoid thickening containing gas bubbles, worrisome for abscess. This structure measures 6.5 x 5.8 cm. No free air or free fluid is identified. Urinary bladder appears to be empty and presumably is below this abscess. Further evaluation with oral and IV contrast is suggested.  Appendix is normal.  No acute bony change.  IMPRESSION: Negative for urinary tract calculi.  Thickening of the sigmoid colon consistent with diverticulitis. Just below the thickened sigmoid colon, there is a soft tissue mass containing gas bubbles, worrisome for abscess. Further evaluation with oral and IV contrast is suggested to confirm these findings.  I discussed the  findings with Dr. Tamera Punt.   Electronically Signed   By: Franchot Gallo M.D.   On: 10/17/2013 07:39   Ct Abdomen Pelvis W Contrast  10/17/2013   CLINICAL DATA:  Pelvic pain with dysuria and hematuria. Rule out diverticulitis or tumor.  EXAM: CT ABDOMEN AND PELVIS WITH CONTRAST  TECHNIQUE: Multidetector CT imaging of the abdomen and pelvis was performed using the standard protocol following bolus administration of intravenous contrast.  CONTRAST:  181mL OMNIPAQUE IOHEXOL 300 MG/ML  SOLN  COMPARISON:  CT abdomen without contrast earlier today  FINDINGS: Lung bases are clear. Liver gallbladder and bile ducts are normal. Pancreas and spleen are normal. No renal obstruction or renal mass.  Segmental thickening of  the sigmoid colon is again noted in the midline. There is mild stranding in the pericolonic fat. Irregular soft tissue mass is present above the bladder with invasion of the dome of the bladder. Delayed images of the bladder reveal irregularity of the dome of the bladder with apparent invasion by the mass lesion. The appearance is most consistent with tumor rather than infection. This mass contains enhancing soft tissue as well as central fluid and gas. The mass measures approximately 5.5 x 7 cm and is located in the midline. There is a small amount of gas in the bladder due to bowel communication. There is a fistulous tract between the sigmoid colon and the mass.  Several gas bubbles are present posterior to the sigmoid colon which may represent extra luminal gas. There is a small amount of free fluid. Small subcentimeter mesenteric lymph nodes are present which could be due to metastatic disease.  Negative for bowel obstruction. Appendix normal. Uterus is not enlarged and appears separate from the mass.  IMPRESSION: 5.5 x 7 cm mass lesion above the dome of the bladder with bladder invasion. There is also segmental thickening of the sigmoid colon. There is a fistulous communication between the mass in the  sigmoid colon. Differential includes infection and tumor. I would favor tumor given the enhancing soft tissue mass lesion. This may be a urogenital neoplasm such is a bladder carcinoma or urachal carcinoma invading the sigmoid colon. Colon cancer invading the bladder is also possible. Mildly prominent mesenteric lymph nodes could be due to metastatic disease. Small gas bubbles are present posterior to sigmoid colon suggesting contained perforation. Small amount of free fluid.   Electronically Signed   By: Marlan Palau M.D.   On: 10/17/2013 09:51    Medications:   . ciprofloxacin  400 mg Intravenous Q12H  . enoxaparin (LOVENOX) injection  60 mg Subcutaneous Q24H  . feeding supplement (RESOURCE BREEZE)  1 Container Oral TID WC  . metronidazole  500 mg Intravenous 3 times per day  . pantoprazole  20 mg Oral Daily  . pneumococcal 23 valent vaccine  0.5 mL Intramuscular Tomorrow-1000  . polyethylene glycol  17 g Oral BID   Continuous Infusions: . sodium chloride 50 mL/hr at 10/17/13 1149    Time spent: 35 minutes with > 50% of time discussing current diagnostic test results, clinical impression and plan of care.    LOS: 2 days   Maryruth Bun Rama  [facility] Pager [phone]. If unable to reach me by pager, please call my cell phone at [phone].  *Please refer to [email or web address], password [email or web address] to get updated schedule on who will round on this patient, as hospitalists switch teams weekly. If 7PM-7AM, please contact night-coverage at [email or web address], password [email or web address] for any overnight needs.  10/19/2013, 7:42 AM    **Disclaimer: This note was dictated with voice recognition software. Similar sounding words can inadvertently be transcribed and this note may contain transcription errors which may not have been corrected upon publication of note.**   Information printed out and given to the patient/family:     In an effort to keep you and your family informed about your hospital stay, I am  providing you with this information sheet. If you or your family have any questions, please do not hesitate to have the nursing staff page me to set up a meeting time.  Bendetta Soltani 10/19/2013 2 (Number of days in the hospital)  Treatment team:  Dr. Hillery Aldo, Hospitalist (Internist)  Dr. Jethro Bolus, Urology  Dr. Stark Klein, Surgery  Active Treatment Issues with Plan: Principal Problem:   Bladder mass  Seen by surgery with recommendations to treat with antibiotics and obtain urology evaluation with possible plans for abdominal exploratory surgery.  Seen by urologist plans for bedside cystoscopy (procedure that allows direct visualization of the inside of the bladder with biopsies if needed today. Active Problems: Weight loss  Dietitian recommends nutritional supplements.   Low red blood cell count (anemia)  Likely iron deficiency from chronic blood loss in the urine, possibly the stool, and from your periods.  We will start you on an iron supplement.   Diverticulitis of colon  Continue antibiotics (Cipro/Flagyl).   Blood clot prevention  Stop Lovenox given your anemia.  We will use compression hoses to prevent blood clots.  Anticipated discharge date: 2-3 days.

## 2013-10-20 ENCOUNTER — Inpatient Hospital Stay (HOSPITAL_COMMUNITY): Payer: No Typology Code available for payment source | Admitting: Anesthesiology

## 2013-10-20 ENCOUNTER — Encounter (HOSPITAL_COMMUNITY)
Admission: EM | Disposition: A | Discharge status: Home Health | Payer: Self-pay | Source: Home / Self Care | Attending: Internal Medicine

## 2013-10-20 ENCOUNTER — Ambulatory Visit (HOSPITAL_COMMUNITY): Admission: RE | Admit: 2013-10-20 | Payer: MEDICAID | Source: Ambulatory Visit | Admitting: General Surgery

## 2013-10-20 ENCOUNTER — Encounter (HOSPITAL_COMMUNITY): Payer: MEDICAID | Admitting: Anesthesiology

## 2013-10-20 ENCOUNTER — Encounter (HOSPITAL_COMMUNITY): Payer: Self-pay | Admitting: Anesthesiology

## 2013-10-20 DIAGNOSIS — N321 Vesicointestinal fistula: Secondary | ICD-10-CM

## 2013-10-20 DIAGNOSIS — C19 Malignant neoplasm of rectosigmoid junction: Secondary | ICD-10-CM

## 2013-10-20 HISTORY — PX: CYSTECTOMY: SHX5119

## 2013-10-20 HISTORY — PX: COLON RESECTION: SHX5231

## 2013-10-20 LAB — SURGICAL PCR SCREEN
MRSA, PCR: NEGATIVE
Staphylococcus aureus: NEGATIVE

## 2013-10-20 LAB — CBC
HCT: 25.1 % — ABNORMAL LOW (ref 36.0–46.0)
Hemoglobin: 7.7 g/dL — ABNORMAL LOW (ref 12.0–15.0)
MCH: 21.1 pg — ABNORMAL LOW (ref 26.0–34.0)
MCHC: 30.7 g/dL (ref 30.0–36.0)
MCV: 68.8 fL — ABNORMAL LOW (ref 78.0–100.0)
Platelets: 362 10*3/uL (ref 150–400)
RBC: 3.65 MIL/uL — ABNORMAL LOW (ref 3.87–5.11)
RDW: 20.8 % — ABNORMAL HIGH (ref 11.5–15.5)
WBC: 4.7 10*3/uL (ref 4.0–10.5)

## 2013-10-20 LAB — POCT I-STAT 4, (NA,K, GLUC, HGB,HCT)
Glucose, Bld: 129 mg/dL — ABNORMAL HIGH (ref 70–99)
HCT: 31 % — ABNORMAL LOW (ref 36.0–46.0)
Hemoglobin: 10.5 g/dL — ABNORMAL LOW (ref 12.0–15.0)
Potassium: 3.6 mEq/L — ABNORMAL LOW (ref 3.7–5.3)
Sodium: 139 mEq/L (ref 137–147)

## 2013-10-20 LAB — PREPARE RBC (CROSSMATCH)

## 2013-10-20 SURGERY — COLON RESECTION
Anesthesia: General | Site: Abdomen

## 2013-10-20 MED ORDER — HYDROMORPHONE HCL PF 2 MG/ML IJ SOLN
INTRAMUSCULAR | Status: AC
  Filled 2013-10-20: qty 1

## 2013-10-20 MED ORDER — GLYCOPYRROLATE 0.2 MG/ML IJ SOLN
INTRAMUSCULAR | Status: DC | PRN
  Administered 2013-10-20: 0.6 mg via INTRAVENOUS

## 2013-10-20 MED ORDER — HYDROMORPHONE HCL PF 1 MG/ML IJ SOLN
0.5000 mg | INTRAMUSCULAR | Status: DC | PRN
  Administered 2013-10-20 – 2013-10-22 (×2): 0.5 mg via INTRAVENOUS
  Filled 2013-10-20 (×2): qty 1

## 2013-10-20 MED ORDER — MIDAZOLAM HCL 2 MG/2ML IJ SOLN
INTRAMUSCULAR | Status: AC
Start: 2013-10-20 — End: 2013-10-20
  Filled 2013-10-20: qty 2

## 2013-10-20 MED ORDER — OXYCODONE HCL 5 MG PO TABS: 5.0000 mg | ORAL_TABLET | Freq: Once | ORAL | Status: DC | PRN

## 2013-10-20 MED ORDER — DIPHENHYDRAMINE HCL 50 MG/ML IJ SOLN
12.5000 mg | Freq: Four times a day (QID) | INTRAMUSCULAR | Status: DC | PRN
Start: 2013-10-20 — End: 2013-10-28

## 2013-10-20 MED ORDER — ALVIMOPAN 12 MG PO CAPS
12.0000 mg | ORAL_CAPSULE | ORAL | Status: AC
  Administered 2013-10-20: 12 mg via ORAL
  Filled 2013-10-20: qty 1

## 2013-10-20 MED ORDER — OXYCODONE HCL 5 MG/5ML PO SOLN
5.0000 mg | Freq: Once | ORAL | Status: DC | PRN
  Filled 2013-10-20: qty 5

## 2013-10-20 MED ORDER — VITAMINS A & D EX OINT
TOPICAL_OINTMENT | CUTANEOUS | Status: AC
  Filled 2013-10-20: qty 5

## 2013-10-20 MED ORDER — ONDANSETRON HCL 4 MG/2ML IJ SOLN
INTRAMUSCULAR | Status: DC | PRN
  Administered 2013-10-20: 4 mg via INTRAVENOUS

## 2013-10-20 MED ORDER — PROPOFOL 10 MG/ML IV BOLUS
INTRAVENOUS | Status: AC
  Filled 2013-10-20: qty 20

## 2013-10-20 MED ORDER — SODIUM CHLORIDE 0.9 % IV SOLN
1.0000 g | Freq: Once | INTRAVENOUS | Status: AC
  Administered 2013-10-20: 1 g via INTRAVENOUS
  Filled 2013-10-20: qty 1

## 2013-10-20 MED ORDER — FENTANYL CITRATE 0.05 MG/ML IJ SOLN
INTRAMUSCULAR | Status: DC | PRN
  Administered 2013-10-20 (×6): 100 ug via INTRAVENOUS

## 2013-10-20 MED ORDER — NEOSTIGMINE METHYLSULFATE 1 MG/ML IJ SOLN
INTRAMUSCULAR | Status: DC | PRN
Start: 2013-10-20 — End: 2013-10-20
  Administered 2013-10-20: 4 mg via INTRAVENOUS

## 2013-10-20 MED ORDER — FENTANYL CITRATE 0.05 MG/ML IJ SOLN
INTRAMUSCULAR | Status: AC
  Filled 2013-10-20: qty 5

## 2013-10-20 MED ORDER — SODIUM CHLORIDE 0.9 % IV SOLN
INTRAVENOUS | Status: AC
  Filled 2013-10-20: qty 1

## 2013-10-20 MED ORDER — BUPIVACAINE ON-Q PAIN PUMP (FOR ORDER SET NO CHG)
INJECTION | Status: AC
  Filled 2013-10-20: qty 1

## 2013-10-20 MED ORDER — DIPHENHYDRAMINE HCL 50 MG/ML IJ SOLN: 12.5000 mg | Freq: Four times a day (QID) | INTRAMUSCULAR | Status: DC | PRN

## 2013-10-20 MED ORDER — ROCURONIUM BROMIDE 100 MG/10ML IV SOLN
INTRAVENOUS | Status: DC | PRN
  Administered 2013-10-20: 50 mg via INTRAVENOUS
  Administered 2013-10-20: 30 mg via INTRAVENOUS
  Administered 2013-10-20: 10 mg via INTRAVENOUS
  Administered 2013-10-20: 20 mg via INTRAVENOUS

## 2013-10-20 MED ORDER — PROPOFOL 10 MG/ML IV BOLUS
INTRAVENOUS | Status: DC | PRN
  Administered 2013-10-20: 200 mg via INTRAVENOUS

## 2013-10-20 MED ORDER — SODIUM CHLORIDE 0.9 % IJ SOLN: 9.0000 mL | INTRAMUSCULAR | Status: DC | PRN

## 2013-10-20 MED ORDER — PROMETHAZINE HCL 25 MG/ML IJ SOLN: 6.2500 mg | Freq: Four times a day (QID) | INTRAMUSCULAR | Status: DC | PRN

## 2013-10-20 MED ORDER — ONDANSETRON HCL 4 MG/2ML IJ SOLN
INTRAMUSCULAR | Status: AC
  Filled 2013-10-20: qty 2

## 2013-10-20 MED ORDER — LIDOCAINE HCL (CARDIAC) 20 MG/ML IV SOLN
INTRAVENOUS | Status: DC | PRN
  Administered 2013-10-20: 75 mg via INTRAVENOUS

## 2013-10-20 MED ORDER — SODIUM CHLORIDE 0.9 % IV SOLN
INTRAVENOUS | Status: DC | PRN
  Administered 2013-10-20 (×2): via INTRAVENOUS

## 2013-10-20 MED ORDER — DIPHENHYDRAMINE HCL 12.5 MG/5ML PO ELIX
12.5000 mg | ORAL_SOLUTION | Freq: Four times a day (QID) | ORAL | Status: DC | PRN
Start: 2013-10-20 — End: 2013-10-28

## 2013-10-20 MED ORDER — HYDROMORPHONE 0.3 MG/ML IV SOLN
INTRAVENOUS | Status: DC
  Administered 2013-10-20: 4.39 mg via INTRAVENOUS
  Administered 2013-10-20 – 2013-10-21 (×2): via INTRAVENOUS
  Administered 2013-10-21: 4.39 mg via INTRAVENOUS
  Administered 2013-10-21: 13:00:00 via INTRAVENOUS
  Administered 2013-10-21: 2.19 mg via INTRAVENOUS
  Administered 2013-10-22: 3.39 mg via INTRAVENOUS
  Administered 2013-10-22: 1.38 mg via INTRAVENOUS
  Administered 2013-10-22: 1.19 mg via INTRAVENOUS
  Administered 2013-10-22: 16:00:00 via INTRAVENOUS
  Administered 2013-10-22: 1.19 mg via INTRAVENOUS
  Administered 2013-10-22: 1.39 mg via INTRAVENOUS
  Administered 2013-10-22: 3.39 mg via INTRAVENOUS
  Administered 2013-10-22: 3.38 mg via INTRAVENOUS
  Administered 2013-10-22: 07:00:00 via INTRAVENOUS
  Administered 2013-10-23: 3.79 mg via INTRAVENOUS
  Administered 2013-10-23: 06:00:00 via INTRAVENOUS
  Administered 2013-10-23: 2.39 mg via INTRAVENOUS
  Administered 2013-10-23: 1.93 mg via INTRAVENOUS
  Administered 2013-10-23: 2.19 mg via INTRAVENOUS
  Administered 2013-10-23: 17:00:00 via INTRAVENOUS
  Administered 2013-10-23: 2.99 mg via INTRAVENOUS
  Administered 2013-10-24: 3.39 mg via INTRAVENOUS
  Administered 2013-10-24: 15:00:00 via INTRAVENOUS
  Administered 2013-10-24: 2.79 mg via INTRAVENOUS
  Administered 2013-10-24: 1.74 mg via INTRAVENOUS
  Administered 2013-10-24: 02:00:00 via INTRAVENOUS
  Administered 2013-10-24 (×2): 1.99 mg via INTRAVENOUS
  Administered 2013-10-24: 3.39 mg via INTRAVENOUS
  Administered 2013-10-25: 0.399 mg via INTRAVENOUS
  Administered 2013-10-25: 2.19 mg via INTRAVENOUS
  Administered 2013-10-25: 08:00:00 via INTRAVENOUS
  Filled 2013-10-20 (×9): qty 25

## 2013-10-20 MED ORDER — NALOXONE HCL 0.4 MG/ML IJ SOLN
0.4000 mg | INTRAMUSCULAR | Status: DC | PRN
Start: 2013-10-20 — End: 2013-10-28

## 2013-10-20 MED ORDER — ALVIMOPAN 12 MG PO CAPS
12.0000 mg | ORAL_CAPSULE | Freq: Two times a day (BID) | ORAL | Status: DC
  Administered 2013-10-21 – 2013-10-25 (×8): 12 mg via ORAL
  Filled 2013-10-20 (×10): qty 1

## 2013-10-20 MED ORDER — BUPIVACAINE 0.25 % ON-Q PUMP DUAL CATH 300 ML
300.0000 mL | INJECTION | Status: DC
Start: 2013-10-20 — End: 2013-10-20
  Filled 2013-10-20: qty 300

## 2013-10-20 MED ORDER — 0.9 % SODIUM CHLORIDE (POUR BTL) OPTIME
TOPICAL | Status: DC | PRN
  Administered 2013-10-20: 4000 mL

## 2013-10-20 MED ORDER — STERILE WATER FOR IRRIGATION IR SOLN
Status: DC | PRN
  Administered 2013-10-20: 1500 mL

## 2013-10-20 MED ORDER — ROCURONIUM BROMIDE 100 MG/10ML IV SOLN
INTRAVENOUS | Status: AC
  Filled 2013-10-20: qty 1

## 2013-10-20 MED ORDER — ACETAMINOPHEN 10 MG/ML IV SOLN
1000.0000 mg | Freq: Four times a day (QID) | INTRAVENOUS | Status: AC
  Administered 2013-10-20 – 2013-10-21 (×4): 1000 mg via INTRAVENOUS
  Filled 2013-10-20 (×5): qty 100

## 2013-10-20 MED ORDER — MIDAZOLAM HCL 2 MG/2ML IJ SOLN
INTRAMUSCULAR | Status: AC
  Filled 2013-10-20: qty 2

## 2013-10-20 MED ORDER — LIDOCAINE HCL (CARDIAC) 20 MG/ML IV SOLN
INTRAVENOUS | Status: AC
  Filled 2013-10-20: qty 5

## 2013-10-20 MED ORDER — HYDROMORPHONE HCL PF 1 MG/ML IJ SOLN
0.2500 mg | INTRAMUSCULAR | Status: DC | PRN
  Administered 2013-10-20 (×2): 0.25 mg via INTRAVENOUS

## 2013-10-20 MED ORDER — MEPERIDINE HCL 50 MG/ML IJ SOLN: 6.2500 mg | INTRAMUSCULAR | Status: DC | PRN

## 2013-10-20 MED ORDER — ALUM & MAG HYDROXIDE-SIMETH 200-200-20 MG/5ML PO SUSP: 30.0000 mL | Freq: Four times a day (QID) | ORAL | Status: DC | PRN

## 2013-10-20 MED ORDER — DIPHENHYDRAMINE HCL 12.5 MG/5ML PO ELIX: 12.5000 mg | ORAL_SOLUTION | Freq: Four times a day (QID) | ORAL | Status: DC | PRN

## 2013-10-20 MED ORDER — MIDAZOLAM HCL 5 MG/5ML IJ SOLN
INTRAMUSCULAR | Status: DC | PRN
  Administered 2013-10-20: 2 mg via INTRAVENOUS

## 2013-10-20 MED ORDER — LACTATED RINGERS IV SOLN
INTRAVENOUS | Status: DC | PRN
  Administered 2013-10-20 (×5): via INTRAVENOUS

## 2013-10-20 MED ORDER — PROMETHAZINE HCL 25 MG/ML IJ SOLN: 6.2500 mg | INTRAMUSCULAR | Status: DC | PRN

## 2013-10-20 MED ORDER — HEPARIN SODIUM (PORCINE) 5000 UNIT/ML IJ SOLN
5000.0000 [IU] | Freq: Three times a day (TID) | INTRAMUSCULAR | Status: DC
  Administered 2013-10-21 – 2013-10-23 (×7): 5000 [IU] via SUBCUTANEOUS
  Filled 2013-10-20 (×10): qty 1

## 2013-10-20 MED ORDER — MUPIROCIN 2 % EX OINT
TOPICAL_OINTMENT | Freq: Two times a day (BID) | CUTANEOUS | Status: DC
  Administered 2013-10-20: 1 via NASAL
  Administered 2013-10-21 – 2013-10-23 (×5): via NASAL
  Administered 2013-10-23: 1 via NASAL
  Administered 2013-10-24 – 2013-10-28 (×6): via NASAL
  Filled 2013-10-20: qty 22

## 2013-10-20 MED ORDER — HYDROMORPHONE 0.3 MG/ML IV SOLN
INTRAVENOUS | Status: AC
  Filled 2013-10-20: qty 25

## 2013-10-20 MED ORDER — HYDROMORPHONE HCL PF 1 MG/ML IJ SOLN
INTRAMUSCULAR | Status: AC
  Filled 2013-10-20: qty 1

## 2013-10-20 MED ORDER — ONDANSETRON HCL 4 MG/2ML IJ SOLN
4.0000 mg | Freq: Four times a day (QID) | INTRAMUSCULAR | Status: DC | PRN
  Administered 2013-10-23: 4 mg via INTRAVENOUS
  Filled 2013-10-20: qty 2

## 2013-10-20 MED ORDER — FENTANYL CITRATE 0.05 MG/ML IJ SOLN
INTRAMUSCULAR | Status: AC
  Filled 2013-10-20: qty 2

## 2013-10-20 MED ORDER — LACTATED RINGERS IV SOLN
INTRAVENOUS | Status: DC
  Administered 2013-10-20 – 2013-10-21 (×3): via INTRAVENOUS

## 2013-10-20 MED ORDER — HYDROMORPHONE HCL PF 1 MG/ML IJ SOLN
INTRAMUSCULAR | Status: DC | PRN
  Administered 2013-10-20: 0.5 mg via INTRAVENOUS
  Administered 2013-10-20 (×2): 1 mg via INTRAVENOUS
  Administered 2013-10-20 (×3): 0.5 mg via INTRAVENOUS

## 2013-10-20 MED ORDER — SUCCINYLCHOLINE CHLORIDE 20 MG/ML IJ SOLN
INTRAMUSCULAR | Status: DC | PRN
  Administered 2013-10-20: 100 mg via INTRAVENOUS

## 2013-10-20 SURGICAL SUPPLY — 77 items
BLADE EXTENDED COATED 6.5IN (ELECTRODE) ×3 IMPLANT
BLADE HEX COATED 2.75 (ELECTRODE) ×6 IMPLANT
BLADE SURG SZ10 CARB STEEL (BLADE) ×3 IMPLANT
CANISTER SUCTION 2500CC (MISCELLANEOUS) ×3 IMPLANT
CATH KIT ON Q 7.5IN SLV (PAIN MANAGEMENT) IMPLANT
CELLS DAT CNTRL 66122 CELL SVR (MISCELLANEOUS) IMPLANT
COUNTER NEEDLE 20 DBL MAG RED (NEEDLE) ×3 IMPLANT
COVER MAYO STAND STRL (DRAPES) ×6 IMPLANT
DECANTER SPIKE VIAL GLASS SM (MISCELLANEOUS) ×3 IMPLANT
DRAIN CHANNEL 19F RND (DRAIN) IMPLANT
DRAPE LAPAROSCOPIC ABDOMINAL (DRAPES) ×3 IMPLANT
DRAPE LG THREE QUARTER DISP (DRAPES) ×3 IMPLANT
DRAPE UTILITY XL STRL (DRAPES) ×6 IMPLANT
DRAPE WARM FLUID 44X44 (DRAPE) ×3 IMPLANT
DRSG OPSITE POSTOP 4X10 (GAUZE/BANDAGES/DRESSINGS) ×1 IMPLANT
DRSG OPSITE POSTOP 4X6 (GAUZE/BANDAGES/DRESSINGS) IMPLANT
DRSG OPSITE POSTOP 4X8 (GAUZE/BANDAGES/DRESSINGS) IMPLANT
DRSG TEGADERM 4X4.75 (GAUZE/BANDAGES/DRESSINGS) IMPLANT
DRSG TELFA 3X8 NADH (GAUZE/BANDAGES/DRESSINGS) ×3 IMPLANT
ELECT REM PT RETURN 9FT ADLT (ELECTROSURGICAL) ×3
ELECTRODE REM PT RTRN 9FT ADLT (ELECTROSURGICAL) ×2 IMPLANT
ENDOLOOP SUT PDS II  0 18 (SUTURE)
ENDOLOOP SUT PDS II 0 18 (SUTURE) IMPLANT
GLOVE BIO SURGEON STRL SZ 6 (GLOVE) ×6 IMPLANT
GLOVE INDICATOR 6.5 STRL GRN (GLOVE) ×6 IMPLANT
GOWN SPEC L3 XXLG W/TWL (GOWN DISPOSABLE) ×6 IMPLANT
GOWN STRL REUS W/TWL XL LVL3 (GOWN DISPOSABLE) ×12 IMPLANT
KIT BASIN OR (CUSTOM PROCEDURE TRAY) ×3 IMPLANT
LEGGING LITHOTOMY PAIR STRL (DRAPES) ×3 IMPLANT
LIGASURE IMPACT 36 18CM CVD LR (INSTRUMENTS) ×1 IMPLANT
LOOP VESSEL MAXI BLUE (MISCELLANEOUS) ×1 IMPLANT
LUBRICANT JELLY K Y 4OZ (MISCELLANEOUS) IMPLANT
PACK GENERAL/GYN (CUSTOM PROCEDURE TRAY) ×3 IMPLANT
PAD DRESSING TELFA 3X8 NADH (GAUZE/BANDAGES/DRESSINGS) IMPLANT
PENCIL BUTTON HOLSTER BLD 10FT (ELECTRODE) ×3 IMPLANT
PLUG CATH AND CAP STER (CATHETERS) ×1 IMPLANT
RELOAD PROXIMATE 75MM BLUE (ENDOMECHANICALS) ×6 IMPLANT
RELOAD STAPLE 75 3.8 BLU REG (ENDOMECHANICALS) IMPLANT
RETRACTOR WND ALEXIS 18 MED (MISCELLANEOUS) IMPLANT
RTRCTR WOUND ALEXIS 18CM MED (MISCELLANEOUS)
SCISSORS LAP 5X35 DISP (ENDOMECHANICALS) ×3 IMPLANT
SLEEVE SURGEON STRL (DRAPES) IMPLANT
SPONGE GAUZE 4X4 12PLY (GAUZE/BANDAGES/DRESSINGS) ×3 IMPLANT
SPONGE LAP 18X18 X RAY DECT (DISPOSABLE) ×6 IMPLANT
STAPLER PROXIMATE 75MM BLUE (STAPLE) ×1 IMPLANT
STAPLER VISISTAT 35W (STAPLE) ×3 IMPLANT
SUCTION POOLE TIP (SUCTIONS) ×3 IMPLANT
SUT ETHILON 2 0 PS N (SUTURE) ×1 IMPLANT
SUT MNCRL AB 4-0 PS2 18 (SUTURE) ×3 IMPLANT
SUT PDS AB 1 CTX 36 (SUTURE) IMPLANT
SUT PDS AB 1 TP1 96 (SUTURE) ×2 IMPLANT
SUT PDS AB 3-0 SH 27 (SUTURE) ×2 IMPLANT
SUT PROLENE 2 0 KS (SUTURE) IMPLANT
SUT SILK 2 0 (SUTURE)
SUT SILK 2 0 SH CR/8 (SUTURE) IMPLANT
SUT SILK 2-0 18XBRD TIE 12 (SUTURE) IMPLANT
SUT SILK 3 0 (SUTURE)
SUT SILK 3 0 SH CR/8 (SUTURE) IMPLANT
SUT SILK 3-0 18XBRD TIE 12 (SUTURE) IMPLANT
SUT VIC AB 2-0 SH 18 (SUTURE) ×4 IMPLANT
SUT VIC AB 2-0 SH 27 (SUTURE) ×30
SUT VIC AB 2-0 SH 27X BRD (SUTURE) IMPLANT
SUT VIC AB 3-0 SH 18 (SUTURE) ×5 IMPLANT
SUT VIC AB 3-0 SH 27 (SUTURE) ×9
SUT VIC AB 3-0 SH 27X BRD (SUTURE) IMPLANT
SUT VIC AB 3-0 SH 27XBRD (SUTURE) IMPLANT
SUT VICRYL 2 0 18  UND BR (SUTURE) ×1
SUT VICRYL 2 0 18 UND BR (SUTURE) ×2 IMPLANT
SUT VICRYL 3 0 BR 18  UND (SUTURE) ×1
SUT VICRYL 3 0 BR 18 UND (SUTURE) ×2 IMPLANT
SYR BULB IRRIGATION 50ML (SYRINGE) ×5 IMPLANT
TOWEL OR 17X26 10 PK STRL BLUE (TOWEL DISPOSABLE) ×7 IMPLANT
TOWEL OR NON WOVEN STRL DISP B (DISPOSABLE) ×6 IMPLANT
TRAY FOLEY CATH 14FRSI W/METER (CATHETERS) ×3 IMPLANT
TUBING CONNECTING 10 (TUBING) IMPLANT
TUNNELER SHEATH ON-Q 16GX12 DP (PAIN MANAGEMENT) ×1 IMPLANT
YANKAUER SUCT BULB TIP 10FT TU (MISCELLANEOUS) ×3 IMPLANT

## 2013-10-20 NOTE — H&P (View-Only) (Signed)
Patient ID: Amy Bentley, female   DOB: 12/01/1970, 42 y.o.   MRN: [id number]    Subjective: Continues to feel better.  Cysto was planned today.    Objective: Vital signs in last 24 hours: Temp:  [98.7 F (37.1 C)-99.6 F (37.6 C)] 99.6 F (37.6 C) (04/23 1445) Pulse Rate:  [85-94] 94 (04/23 1445) Resp:  [16-18] 18 (04/23 1445) BP: (104-122)/(61-76) 108/61 mmHg (04/23 1445) SpO2:  [98 %-100 %] 100 % (04/23 1445) Last BM Date: 10/18/13 480 po RECORDED. Regular diet 1 bm Afebrile, VSS BMP OK, H/H down from 9.3 to 7.9 this AM 300 urine output recorded Intake/Output from previous day: 04/22 0701 - 04/23 0700 In: 720 [P.O.:720] Out: 1650 [Urine:1650] Intake/Output this shift: Total I/O In: 240 [P.O.:240] Out: 200 [Urine:200]  General appearance: alert, cooperative and no distress Resp: clear to auscultation bilaterally GI: soft, sore in the lower abdomen.  + BM.  Lab Results:   Recent Labs  10/18/13 0946 10/19/13 0355 10/19/13 1140  WBC 5.1 8.2  --   HGB 8.1* 8.1* 8.7*  HCT 25.8* 27.4* 28.5*  PLT 367 365  --     BMET  Recent Labs  10/17/13 0650 10/18/13 0320  NA 140 138  K 3.8 3.8  CL 102 105  CO2 24 23  GLUCOSE 110* 98  BUN 6 5*  CREATININE 0.77 0.70  CALCIUM 9.6 8.4   PT/INR No results found for this basename: LABPROT, INR,  in the last 72 hours   Recent Labs Lab 10/17/13 0650  AST 14  ALT 9  ALKPHOS 94  BILITOT 0.3  PROT 7.4  ALBUMIN 3.3*     Lipase  No results found for this basename: lipase     Studies/Results: No results found.  Medications: . ciprofloxacin  400 mg Intravenous Q12H  . feeding supplement (RESOURCE BREEZE)  1 Container Oral TID WC  . iron polysaccharides  150 mg Oral Daily  . metronidazole  500 mg Intravenous 3 times per day  . pantoprazole  20 mg Oral Daily  . pneumococcal 23 valent vaccine  0.5 mL Intramuscular Tomorrow-1000  . polyethylene glycol  17 g Oral BID    Assessment/Plan 1. 5.5 x 7 cm mass  above the dome of the bladder, with invasion into the bladder. Fistula from the sigmoid colon.  2. Bladder cancer with possible invasion of the colon, or colon cancer with invasion into the bladder.  3. UTI  4. Weight loss  5. BMI 47  6. Hx of tobacco use  7.  H/H is down  Probable ex lap tomorrow with sigmoid resection/partial bladder resection/left stent/probable colostomy.  Suspicious for colon cancer, but may be all diverticulitis.    Reviewed risks and benefits with patient. May have to cancel if emergent cases bump.      LOS: 2 days    Amy Bentley 10/19/2013  

## 2013-10-20 NOTE — Op Note (Addendum)
Preoperative diagnosis:  1. Pelvic mass 2. vesico-colonic fistula   Postoperative diagnosis:  1. As above   Procedure: 1. Partial cystectomy  Co-Surgeon: Ardis Hughs, MD Co-Surgeon: Dr. Stark Klein, MD   Anesthesia: General  Complications: None  Intraoperative findings: large mass involving large segment of dome/posterior wall of the bladder, well away from trigone.  Bladder was reconstructed in two layers. Significantly reduced bladder capacity.  EBL: Minimal  Specimens: None  Indication: Amy Bentley is a 43 y.o. patient with the vesicocolonic fistula associated with a large pelvic mass. In combination with general surgery decision was made to proceed with the OR for complete en bloc resection.  After reviewing the management options for treatment, he elected to proceed with the above surgical procedure(s). We have discussed the potential benefits and risks of the procedure, side effects of the proposed treatment, the likelihood of the patient achieving the goals of the procedure, and any potential problems that might occur during the procedure or recuperation. Informed consent has been obtained.  Description of procedure:  The patient was taken to the operating room and general anesthesia was induced.  She was then prepped and draped in the routine sterile fashion. The vagina was prepped into the field so a sterile Foley catheter could be placed. An 52 French Foley catheter was then inserted and capped.  Please see Dr. Marlowe Aschoff dictation for details of opening the abdomen, resection of the sigmoid colon and reanastomosis, and closing of the wound.  The sigmoid colon was resected both proximally and distally to the mass. The posterior plane was defined between the patient's uterus and the sigmoid colon. In order to get around the mass completely on the anterior aspect incision was made over the bladder and the dome. Once in the bladder we were able to better delineate  the margins of the tumor and resected the dome/posterior aspect of the bladder. At this point the specimen was freed and sent to pathology for frozen sections of the margins. The trigone was identified and each UO easily seen and well away from our resection.  Once the margins returned negative we then closed the bladder using a 3-0 Vicryl in an interrupted fashion in the corners and then a running stitch in between. The Foley catheter was then irrigated with sterile saline and several leaks were identified and closed.  A second layer was then done using a running 2-0 Vicryl closing the perivesical fat over top of the incision. The anastomosis was then retested with ~60 cc of sterile saline and no leak was identified. This point we placed a drain in the pelvis, 19 [place] Blake, and secured it to the skin with a 2-0 nylon. We then took a piece of the omentum and pulled it down into the pelvis and sewed it between the bladder and the peritoneal contents so as to provide a third layer over the bladder incision  The incision was then closed by Dr. [name]. Again please see her dictation for more details.  At the end of the case a last needles and sponges had been accounted for. The patient was returned back in excellent condition.  Disposition: Foley catheter should remain in place for at least the next 2 weeks. The patient will be on the Gen. surgery service, urology will be following along his consulting physicians.  Ardis Hughs, M.D.

## 2013-10-20 NOTE — Interval H&P Note (Signed)
History and Physical Interval Note:  10/20/2013 11:19 AM  Amy Bentley  has presented today for surgery, with the diagnosis of colon cancer  The various methods of treatment have been discussed with the patient and family. After consideration of risks, benefits and other options for treatment, the patient has consented to  Procedure(s): OPEN SIGMOID COLON COLOVESICAL FISTULA REPAIR (N/A) as a surgical intervention .  The patient's history has been reviewed, patient examined, no change in status, stable for surgery.  I have reviewed the patient's chart and labs.  Questions were answered to the patient's satisfaction.     Stark Klein

## 2013-10-20 NOTE — Op Note (Signed)
PRE-OPERATIVE DIAGNOSIS: Pelvic mass with colovesical fistula  POST-OPERATIVE DIAGNOSIS:  Same  PROCEDURE:  Procedure(s): Sigmoid colectomy, takedown of colovesical fistula  SURGEON:  Surgeon(s): Stark Klein, MD  ASSISTANT:   Burman Nieves, MD (urology).    ANESTHESIA:   general  DRAINS: (19 Fr Keenan Bachelor) Blake drain(s) in the pelvis   LOCAL MEDICATIONS USED:  NONE  SPECIMEN:  Source of Specimen:  sigmoid colon with bladder  DISPOSITION OF SPECIMEN:  PATHOLOGY  COUNTS:  YES  DICTATION: .Dragon Dictation  PLAN OF CARE: Admit to inpatient   PATIENT DISPOSITION:  PACU - hemodynamically stable.   EBL:  600  FINDINGS:  Large mass between colon and bladder  PROCEDURE:    Patient was identified in the holding area and taken to the operating room where she was placed supine on the operating room table. General anesthesia was induced. She was placed into the low lithotomy position. Her perineum and abdomen were prepped and draped in sterile fashion. A Foley was inserted sterilely on the field. Timeouts performed according to the surgical safety checklist. When all was correct, we continued.  A midline incision was made in the lower abdomen with a #10 blade. Subcutaneous tissues are divided with the cautery.  The fascia was entered with the cautery in the midline as well. The peritoneum was elevated and entered sharply. The fascial incision was carried out the length of the skin incision at the cautery. A Bookwalter retractor was placed for visualization. There was immediately apparent a very large mass adhering the sigmoid colon to the bladder. The the descending colon was identified in the proximal sigmoid was divided in order to be able to retract the mass. The peritoneum overlying the mesentery was scored with cautery. The left ureter was found by Dr. Louis Meckel. The mesocolon was taken down with the LigaSure.   The inflammatory mass was also partially adherent to the anterior uterus and  ovaries. These adhesions were lysed bluntly with finger fracture.  Once the portion of colon had been more readily identified, it became apparent that the sigmoid loop although a around and it was quite long. The proximal rectum was identified above the peritoneal reflection. This was divided with the GIA-75 stapler. The remaining medial colon was taken down after the right ureter was located and traced out. The inflammatory mass was adherent to a portion of bladder or 0.6 cm in diameter. Dr. Louis Meckel divided the bladder adjacent to the mass. Care was taken to incorporate as well bladder is possible with the resection. This was taken en bloc. Once the mass was completely freed, it was marked and sent for frozen sections.   The primary anastomosis of the colon was undertaken next. A side-to-side functional end-to-end anastomosis was created by securing the 2 ends of the colon with 3-0 Vicryl sutures. The tensor opened and a low the GIA-75 stapler was fired. The defect was closed with 3-0 Vicryl pops and running 3-0 PDS in 2 layers. The area was irrigated. There is no evidence of leak. The epiploic appendages of the colon were placed around the anastomosis.   The frozens returned as no evidence of cancer of the bladder margins. Dr. Louis Meckel returned and repaired the bladder. Prior to this, the colon protocol was implemented and the instruments, bovie, and suction were changed out.  The bladder was repaired in 2 layers and this will be dictated separately by Dr. Louis Meckel.  This was tested and was not leaking. A 19 [place] Blake drain was placed in the  pelvis. The On-Q tunneler was used to place the catheters on both sides of the incision and the preperitoneal space. The fascial incision was closed with running #1 labeled PDS suture. The skin was irrigated and closed with staples with intervening Telfa looks. The wound was cleaned, dried, and dressed with the usual dressing. The On-Q catheters were advanced to the  toddlers and the tunneler sheath was pulled away. These are secured with Steri-Strips and Tegaderm. The patient was allowed to emerge from anesthesia and taken to PACU in stable condition. Needle, sponge, and instrument counts were correct x2.

## 2013-10-20 NOTE — Progress Notes (Signed)
Progress Note   Amy Bentley ZWC:[id number] DOB: 11-04-1970 DOA: 10/17/2013 PCP: [name], MD   Brief Narrative:   Amy Bentley is an 43 y.o. female with a PMH of recurrent UTI was admitted 10/17/13 with abdominal pain, hematuria and weight loss of 30 pounds over the past one-2 months. Upon initial evaluation in the ED, the patient was noted to have hematuria, and a CT scan of the abdomen/pelvis showed a 5.5 X7 centimeter mass lesion above the dome of the bladder with bladder invasion. There was also sigmoid colon thickening and a possible contained microperforation.  Assessment/Plan:   Principal Problem:   Probable bladder versus colon cancer versus complicated diverticulitis  CEA 125 high at 44, CEA high at 7.8, AFP WNL at 1.9. Urine culture negative, but colo-vesicle fistula is present.  Status post cystoscopy 10/19/13 which showed a 2-3 cm inflammatory mass with surrounding bullous edema and a colovesical fistula. There is also a 5.5 X7 centimeter mass above the dome of the bladder.  Exploratory laparoscopy with sigmoid resection/partial bladder resection/left stent and probable colostomy scheduled for today. Active Problems:   Severe protein calorie malnutrition / Weight loss / morbid obesity  Has had 10% weight loss over the past 2-3 months and oral intake of less than 75% for greater than one month.  Seen by dietitian. Continue supplements per dietitian recommendations.   Microcytic anemia / Rectal bleeding  Likely iron deficiency from chronic GI with GU blood loss and menstrual losses all contributory.  Monitor hemoglobin and continue iron supplements. Hemoglobin down to 7.7 today. May need a transfusion.   Diverticulitis of colon (without mention of hemorrhage)  Continue Cipro/Flagyl.   DVT Prophylaxis  Continue SCDs.  Code Status: Full. Family Communication: Husband updated at bedside. Disposition Plan: Home when stable.   IV Access:     Peripheral IV   Procedures:    None.   Medical Consultants:    Dr. Carolan Clines, Urology  Dr. Stark Klein, Surgery  Other Consultants:    Dietitian   Anti-Infectives:    Cipro 10/17/13--->  Flagyl 10/17/13--->  Subjective:   Amy Bentley continues to have occasional lower abdominal pain, but no nausea or vomiting. No dyspnea. No further bloody stools.  Objective:    Filed Vitals:   10/19/13 0534 10/19/13 1445 10/19/13 2145 10/20/13 0525  BP: 104/64 108/61 128/69 117/62  Pulse: 85 94 93 75  Temp: 99.2 F (37.3 C) 99.6 F (37.6 C) 97.9 F (36.6 C) 98.1 F (36.7 C)  TempSrc: Oral Oral Oral Oral  Resp: 18 18 18 18   Height:      Weight:      SpO2: 98% 100% 100% 100%    Intake/Output Summary (Last 24 hours) at 10/20/13 0759 Last data filed at 10/20/13 0525  Gross per 24 hour  Intake    240 ml  Output   1325 ml  Net  -1085 ml    Exam: Gen:  NAD Cardiovascular:  RRR, No M/R/G Respiratory:  Lungs CTAB Gastrointestinal:  Abdomen soft, NT/ND, + BS Extremities:  No C/E/C   Data Reviewed:    Labs: Basic Metabolic Panel:  Recent Labs Lab 10/17/13 0650 10/18/13 0320  NA 140 138  K 3.8 3.8  CL 102 105  CO2 24 23  GLUCOSE 110* 98  BUN 6 5*  CREATININE 0.77 0.70  CALCIUM 9.6 8.4   GFR Estimated Creatinine Clearance: 109.2 ml/min (by C-G formula based on Cr of 0.7). Liver Function Tests:  Recent Labs  Lab 10/17/13 0650  AST 14  ALT 9  ALKPHOS 94  BILITOT 0.3  PROT 7.4  ALBUMIN 3.3*   microcytic anemi CBC:  Recent Labs Lab 10/17/13 0650 10/18/13 0320 10/18/13 0946 10/19/13 0355 10/19/13 1140 10/20/13 0328  WBC 8.6 5.3 5.1 8.2  --  4.7  NEUTROABS 6.5  --   --   --   --   --   HGB 9.3* 7.9* 8.1* 8.1* 8.7* 7.7*  HCT 29.7* 26.2* 25.8* 27.4* 28.5* 25.1*  MCV 67.8* 69.3* 68.3* 70.4*  --  68.8*  PLT 407* 328 367 365  --  362   Sepsis Labs:  Recent Labs Lab 10/18/13 0320 10/18/13 0946 10/19/13 0355  10/20/13 0328  WBC 5.3 5.1 8.2 4.7   Microbiology Recent Results (from the past 240 hour(s))  URINE CULTURE     Status: None   Collection Time    10/17/13  9:35 PM      Result Value Ref Range Status   Specimen Description URINE, CLEAN CATCH   Final   Special Requests NONE   Final   Culture  Setup Time     Final   Value: 10/18/2013 00:32     Performed at [facility]     Final   Value: NO GROWTH     Performed at [facility]   Culture     Final   Value: NO GROWTH     Performed at [facility]   Report Status 10/18/2013 FINAL   Final     Radiographs/Studies:   Ct Abdomen Pelvis Wo Contrast  10/17/2013   CLINICAL DATA:  Right flank pain and hematuria. Dysuria. Recent UTI  EXAM: CT ABDOMEN AND PELVIS WITHOUT CONTRAST  TECHNIQUE: Multidetector CT imaging of the abdomen and pelvis was performed following the standard protocol without intravenous contrast.  COMPARISON:  None.  FINDINGS: Lung bases are clear. Unenhanced images of the liver gallbladder and bile ducts are normal. Pancreas and spleen are normal.  The kidneys show no obstruction or mass.  No urinary tract calculi.  Thickening of the sigmoid colon just above the urinary bladder. There is stranding in the pericolonic fat. There is a soft tissue structure above the bladder and below the sigmoid thickening containing gas bubbles, worrisome for abscess. This structure measures 6.5 x 5.8 cm. No free air or free fluid is identified. Urinary bladder appears to be empty and presumably is below this abscess. Further evaluation with oral and IV contrast is suggested.  Appendix is normal.  No acute bony change.  IMPRESSION: Negative for urinary tract calculi.  Thickening of the sigmoid colon consistent with diverticulitis. Just below the thickened sigmoid colon, there is a soft tissue mass containing gas bubbles, worrisome for abscess. Further evaluation with oral and IV contrast is suggested to confirm  these findings.  I discussed the findings with Dr. Tamera Punt.   Electronically Signed   By: Franchot Gallo M.D.   On: 10/17/2013 07:39   Ct Abdomen Pelvis W Contrast  10/17/2013   CLINICAL DATA:  Pelvic pain with dysuria and hematuria. Rule out diverticulitis or tumor.  EXAM: CT ABDOMEN AND PELVIS WITH CONTRAST  TECHNIQUE: Multidetector CT imaging of the abdomen and pelvis was performed using the standard protocol following bolus administration of intravenous contrast.  CONTRAST:  162mL OMNIPAQUE IOHEXOL 300 MG/ML  SOLN  COMPARISON:  CT abdomen without contrast earlier today  FINDINGS: Lung bases are clear. Liver gallbladder and bile ducts are  normal. Pancreas and spleen are normal. No renal obstruction or renal mass.  Segmental thickening of the sigmoid colon is again noted in the midline. There is mild stranding in the pericolonic fat. Irregular soft tissue mass is present above the bladder with invasion of the dome of the bladder. Delayed images of the bladder reveal irregularity of the dome of the bladder with apparent invasion by the mass lesion. The appearance is most consistent with tumor rather than infection. This mass contains enhancing soft tissue as well as central fluid and gas. The mass measures approximately 5.5 x 7 cm and is located in the midline. There is a small amount of gas in the bladder due to bowel communication. There is a fistulous tract between the sigmoid colon and the mass.  Several gas bubbles are present posterior to the sigmoid colon which may represent extra luminal gas. There is a small amount of free fluid. Small subcentimeter mesenteric lymph nodes are present which could be due to metastatic disease.  Negative for bowel obstruction. Appendix normal. Uterus is not enlarged and appears separate from the mass.  IMPRESSION: 5.5 x 7 cm mass lesion above the dome of the bladder with bladder invasion. There is also segmental thickening of the sigmoid colon. There is a fistulous  communication between the mass in the sigmoid colon. Differential includes infection and tumor. I would favor tumor given the enhancing soft tissue mass lesion. This may be a urogenital neoplasm such is a bladder carcinoma or urachal carcinoma invading the sigmoid colon. Colon cancer invading the bladder is also possible. Mildly prominent mesenteric lymph nodes could be due to metastatic disease. Small gas bubbles are present posterior to sigmoid colon suggesting contained perforation. Small amount of free fluid.   Electronically Signed   By: Franchot Gallo M.D.   On: 10/17/2013 09:51    Medications:   . ciprofloxacin  400 mg Intravenous Q12H  . feeding supplement (RESOURCE BREEZE)  1 Container Oral TID WC  . iron polysaccharides  150 mg Oral Daily  . metronidazole  500 mg Intravenous 3 times per day  . pantoprazole  20 mg Oral Daily  . pneumococcal 23 valent vaccine  0.5 mL Intramuscular Tomorrow-1000  . polyethylene glycol  17 g Oral BID   Continuous Infusions: . sodium chloride 50 mL/hr at 10/17/13 1149    Time spent: 25 minutes.    LOS: 3 days   [place]  [facility] Pager [phone]. If unable to reach me by pager, please call my cell phone at [phone].  *Please refer to [email or web address], password [email or web address] to get updated schedule on who will round on this patient, as hospitalists switch teams weekly. If 7PM-7AM, please contact night-coverage at [email or web address], password [email or web address] for any overnight needs.  10/20/2013, 7:59 AM    **Disclaimer: This note was dictated with voice recognition software. Similar sounding words can inadvertently be transcribed and this note may contain transcription errors which may not have been corrected upon publication of note.**   Information printed out and given to the patient/family:     In an effort to keep you and your family informed about your hospital stay, I am providing you with this information sheet. If you or your family have any  questions, please do not hesitate to have the nursing staff page me to set up a meeting time.  Amy Bentley 10/20/2013 3 (Number of days in the hospital)  Treatment team:  Dr. Jacquelynn Cree, Hospitalist (Internist)  Dr. Carolan Clines, Urology  Dr. Stark Klein, Surgery  Active Treatment Issues with Plan: Principal Problem:   Bladder mass  Seen by surgery with recommendations to treat with antibiotics and obtain urology evaluation with plans for abdominal exploratory surgery scheduled for today.  Seen by urologist and had a cystoscopy (procedure that allows direct visualization of the inside of the bladder) with biopsies 10/19/13. The results from your biopsy are not yet available. Active Problems: Weight loss  Dietitian recommends nutritional supplements.   Low red blood cell count (anemia)  Likely iron deficiency from chronic blood loss in the urine, possibly the stool, and from your periods.  We started you on an iron supplement as blood tests show you are iron deficient.   Diverticulitis of colon  Continue antibiotics (Cipro/Flagyl).   Blood clot prevention  Continue compression hoses to prevent blood clots.  Anticipated discharge date: Depends on progress after surgery.

## 2013-10-20 NOTE — Anesthesia Preprocedure Evaluation (Addendum)
Anesthesia Evaluation  Patient identified by MRN, date of birth, ID band Patient awake    Reviewed: Allergy & Precautions, H&P , NPO status , Patient's Chart, lab work & pertinent test results  Airway Mallampati: II TM Distance: >3 FB Neck ROM: Full    Dental  (+) Dental Advisory Given   Pulmonary Current Smoker,  breath sounds clear to auscultation- rhonchi        Cardiovascular negative cardio ROS  Rhythm:Regular Rate:Normal     Neuro/Psych negative neurological ROS  negative psych ROS   GI/Hepatic negative GI ROS, Neg liver ROS,   Endo/Other  Morbid obesity  Renal/GU negative Renal ROS     Musculoskeletal negative musculoskeletal ROS (+)   Abdominal   Peds  Hematology  (+) Blood dyscrasia, anemia ,   Anesthesia Other Findings   Reproductive/Obstetrics negative OB ROS                          Anesthesia Physical Anesthesia Plan  ASA: III  Anesthesia Plan: General   Post-op Pain Management:    Induction: Intravenous  Airway Management Planned: Oral ETT  Additional Equipment:   Intra-op Plan:   Post-operative Plan: Extubation in OR  Informed Consent: I have reviewed the patients History and Physical, chart, labs and discussed the procedure including the risks, benefits and alternatives for the proposed anesthesia with the patient or authorized representative who has indicated his/her understanding and acceptance.   Dental advisory given  Plan Discussed with: CRNA  Anesthesia Plan Comments:         Anesthesia Quick Evaluation

## 2013-10-20 NOTE — Transfer of Care (Signed)
Immediate Anesthesia Transfer of Care Note  Patient: Amy Bentley  Procedure(s) Performed: Procedure(s): OPEN SIGMOID COLONECTOMY COLOVESICAL FISTULA REPAIR (N/A) CYSTECTOMY PARTIAL (N/A)  Patient Location: PACU  Anesthesia Type:General  Level of Consciousness: awake, alert  and oriented  Airway & Oxygen Therapy: Patient Spontanous Breathing and Patient connected to face mask oxygen  Post-op Assessment: Report given to PACU RN and Post -op Vital signs reviewed and stable  Post vital signs: Reviewed and stable  Complications: No apparent anesthesia complications

## 2013-10-20 NOTE — Anesthesia Postprocedure Evaluation (Signed)
Anesthesia Post Note  Patient: Amy Bentley  Procedure(s) Performed: Procedure(s) (LRB): OPEN SIGMOID COLONECTOMY COLOVESICAL FISTULA REPAIR (N/A) CYSTECTOMY PARTIAL (N/A)  Anesthesia type: General  Patient location: PACU  Post pain: Pain level controlled  Post assessment: Post-op Vital signs reviewed  Last Vitals: BP 136/87  Pulse 97  Temp(Src) 36.6 C (Oral)  Resp 16  Ht 5\' 1"  (1.549 m)  Wt 250 lb (113.399 kg)  BMI 47.26 kg/m2  SpO2 100%  LMP 10/14/2013  Post vital signs: Reviewed  Level of consciousness: sedated  Complications: No apparent anesthesia complications

## 2013-10-21 LAB — BASIC METABOLIC PANEL
BUN: 3 mg/dL — ABNORMAL LOW (ref 6–23)
CO2: 23 mEq/L (ref 19–32)
Calcium: 7.9 mg/dL — ABNORMAL LOW (ref 8.4–10.5)
Chloride: 104 mEq/L (ref 96–112)
Creatinine, Ser: 0.75 mg/dL (ref 0.50–1.10)
GFR calc Af Amer: >90 mL/min (ref >90)
GFR calc non Af Amer: >90 mL/min (ref >90)
Glucose, Bld: 119 mg/dL — ABNORMAL HIGH (ref 70–99)
Potassium: 4.2 mEq/L (ref 3.7–5.3)
Sodium: 138 mEq/L (ref 137–147)

## 2013-10-21 LAB — CBC
HCT: 34.4 % — ABNORMAL LOW (ref 36.0–46.0)
Hemoglobin: 10.9 g/dL — ABNORMAL LOW (ref 12.0–15.0)
MCH: 23.6 pg — ABNORMAL LOW (ref 26.0–34.0)
MCHC: 31.7 g/dL (ref 30.0–36.0)
MCV: 74.5 fL — ABNORMAL LOW (ref 78.0–100.0)
Platelets: 357 10*3/uL (ref 150–400)
RBC: 4.62 MIL/uL (ref 3.87–5.11)
RDW: 20.1 % — ABNORMAL HIGH (ref 11.5–15.5)
WBC: 13.3 10*3/uL — ABNORMAL HIGH (ref 4.0–10.5)

## 2013-10-21 NOTE — Progress Notes (Signed)
PT Cancellation Note  Patient Details Name: Amy Bentley MRN: [id number] DOB: 1970-12-14   Cancelled Treatment:    Reason Eval/Treat Not Completed: Pain limiting ability to participate--pt declined to participate with therapy today. Requested PT check back tomorrow.    Weston Anna, MPT Pager: [phone]

## 2013-10-21 NOTE — Progress Notes (Signed)
Progress Note   Amy Bentley IRC:[id number] DOB: 04-07-1971 DOA: 10/17/2013 PCP: [name], MD   Brief Narrative:   Amy Bentley is an 43 y.o. female with a PMH of recurrent UTI was admitted 10/17/13 with abdominal pain, hematuria and weight loss of 30 pounds over the past one-2 months. Upon initial evaluation in the ED, the patient was noted to have hematuria, and a CT scan of the abdomen/pelvis showed a 5.5 X7 centimeter mass lesion above the dome of the bladder with bladder invasion. There was also sigmoid colon thickening and a possible contained microperforation.  Assessment/Plan:   Principal Problem:   Probable bladder versus colon cancer versus complicated diverticulitis  CEA 125 high at 44, CEA high at 7.8, AFP WNL at 1.9. Urine culture negative, but colo-vesicle fistula is present.  Status post cystoscopy 10/19/13 which showed a 2-3 cm inflammatory mass with surrounding bullous edema and a colovesical fistula. There is also a 5.5 X7 centimeter mass above the dome of the bladder.  Status post open sigmoid colonectomy and colovesical fistula repair with partial cystectomy 10/20/13. Pathology pending. Active Problems:   Severe protein calorie malnutrition / Weight loss / morbid obesity  Has had 10% weight loss over the past 2-3 months and oral intake of less than 75% for greater than one month.  Seen by dietitian. Continue supplements per dietitian recommendations.   Microcytic anemia / Rectal bleeding  Likely iron deficiency from chronic GI with GU blood loss and menstrual losses all contributory.  Monitor hemoglobin and continue iron supplements. Hemoglobin stable postoperatively.   Diverticulitis of colon (without mention of hemorrhage)  Continue Cipro/Flagyl.   DVT Prophylaxis  Continue SCDs.  Code Status: Full. Family Communication: Husband updated at bedside 10/20/13. Disposition Plan: Home when stable.   IV Access:    Peripheral  IV   Procedures:    None.   Medical Consultants:    Dr. Carolan Clines, Urology  Dr. Stark Klein, Surgery  Other Consultants:    Dietitian   Anti-Infectives:    Cipro 10/17/13--->  Flagyl 10/17/13--->  Raynelle Jan 10/20/13  Subjective:   Amy Bentley has some incisional pain, but no nausea or vomiting. No dyspnea.   Objective:    Filed Vitals:   10/21/13 0400 10/21/13 0500 10/21/13 0600 10/21/13 0612  BP: 102/67 114/86  125/77  Pulse: 81 77 108 97  Temp: 98 F (36.7 C)     TempSrc: Oral     Resp: 18 13 18 18   Height:      Weight:      SpO2: 100% 100% 98% 100%    Intake/Output Summary (Last 24 hours) at 10/21/13 0730 Last data filed at 10/21/13 5102  Gross per 24 hour  Intake   8737 ml  Output   1555 ml  Net   7182 ml    Exam: Gen:  NAD Cardiovascular:  RRR, No M/R/G Respiratory:  Lungs CTAB Gastrointestinal:  Abdomen soft, NT/ND, + BS Extremities:  No C/E/C   Data Reviewed:    Labs: Basic Metabolic Panel:  Recent Labs Lab 10/17/13 0650 10/18/13 0320 10/20/13 1526 10/21/13 0335  NA 140 138 139 138  K 3.8 3.8 3.6* 4.2  CL 102 105  --  104  CO2 24 23  --  23  GLUCOSE 110* 98 129* 119*  BUN 6 5*  --  3*  CREATININE 0.77 0.70  --  0.75  CALCIUM 9.6 8.4  --  7.9*   GFR Estimated Creatinine Clearance: 108.6  ml/min (by C-G formula based on Cr of 0.75). Liver Function Tests:  Recent Labs Lab 10/17/13 0650  AST 14  ALT 9  ALKPHOS 94  BILITOT 0.3  PROT 7.4  ALBUMIN 3.3*   microcytic anemi CBC:  Recent Labs Lab 10/17/13 0650 10/18/13 0320 10/18/13 0946 10/19/13 0355 10/19/13 1140 10/20/13 0328 10/20/13 1526 10/21/13 0335  WBC 8.6 5.3 5.1 8.2  --  4.7  --  13.3*  NEUTROABS 6.5  --   --   --   --   --   --   --   HGB 9.3* 7.9* 8.1* 8.1* 8.7* 7.7* 10.5* 10.9*  HCT 29.7* 26.2* 25.8* 27.4* 28.5* 25.1* 31.0* 34.4*  MCV 67.8* 69.3* 68.3* 70.4*  --  68.8*  --  74.5*  PLT 407* 328 367 365  --  362  --  357    Sepsis Labs:  Recent Labs Lab 10/18/13 0946 10/19/13 0355 10/20/13 0328 10/21/13 0335  WBC 5.1 8.2 4.7 13.3*   Microbiology Recent Results (from the past 240 hour(s))  URINE CULTURE     Status: None   Collection Time    10/17/13  9:35 PM      Result Value Ref Range Status   Specimen Description URINE, CLEAN CATCH   Final   Special Requests NONE   Final   Culture  Setup Time     Final   Value: 10/18/2013 00:32     Performed at [facility] Count     Final   Value: NO GROWTH     Performed at [facility]   Culture     Final   Value: NO GROWTH     Performed at [facility]   Report Status 10/18/2013 FINAL   Final  SURGICAL PCR SCREEN     Status: None   Collection Time    10/20/13 10:55 AM      Result Value Ref Range Status   MRSA, PCR NEGATIVE  NEGATIVE Final   Staphylococcus aureus NEGATIVE  NEGATIVE Final   Comment:            The Xpert SA Assay (FDA     approved for NASAL specimens     in patients over 80 years of age),     is one component of     a comprehensive surveillance     program.  Test performance has     been validated by [employer] for patients greater     than or equal to 69 year old.     It is not intended     to diagnose infection nor to     guide or monitor treatment.     Radiographs/Studies:   Ct Abdomen Pelvis Wo Contrast  10/17/2013   CLINICAL DATA:  Right flank pain and hematuria. Dysuria. Recent UTI  EXAM: CT ABDOMEN AND PELVIS WITHOUT CONTRAST  TECHNIQUE: Multidetector CT imaging of the abdomen and pelvis was performed following the standard protocol without intravenous contrast.  COMPARISON:  None.  FINDINGS: Lung bases are clear. Unenhanced images of the liver gallbladder and bile ducts are normal. Pancreas and spleen are normal.  The kidneys show no obstruction or mass.  No urinary tract calculi.  Thickening of the sigmoid colon just above the urinary bladder. There is stranding in the pericolonic  fat. There is a soft tissue structure above the bladder and below the sigmoid thickening containing gas bubbles, worrisome for abscess. This  structure measures 6.5 x 5.8 cm. No free air or free fluid is identified. Urinary bladder appears to be empty and presumably is below this abscess. Further evaluation with oral and IV contrast is suggested.  Appendix is normal.  No acute bony change.  IMPRESSION: Negative for urinary tract calculi.  Thickening of the sigmoid colon consistent with diverticulitis. Just below the thickened sigmoid colon, there is a soft tissue mass containing gas bubbles, worrisome for abscess. Further evaluation with oral and IV contrast is suggested to confirm these findings.  I discussed the findings with Dr. Tamera Punt.   Electronically Signed   By: Franchot Gallo M.D.   On: 10/17/2013 07:39   Ct Abdomen Pelvis W Contrast  10/17/2013   CLINICAL DATA:  Pelvic pain with dysuria and hematuria. Rule out diverticulitis or tumor.  EXAM: CT ABDOMEN AND PELVIS WITH CONTRAST  TECHNIQUE: Multidetector CT imaging of the abdomen and pelvis was performed using the standard protocol following bolus administration of intravenous contrast.  CONTRAST:  150mL OMNIPAQUE IOHEXOL 300 MG/ML  SOLN  COMPARISON:  CT abdomen without contrast earlier today  FINDINGS: Lung bases are clear. Liver gallbladder and bile ducts are normal. Pancreas and spleen are normal. No renal obstruction or renal mass.  Segmental thickening of the sigmoid colon is again noted in the midline. There is mild stranding in the pericolonic fat. Irregular soft tissue mass is present above the bladder with invasion of the dome of the bladder. Delayed images of the bladder reveal irregularity of the dome of the bladder with apparent invasion by the mass lesion. The appearance is most consistent with tumor rather than infection. This mass contains enhancing soft tissue as well as central fluid and gas. The mass measures approximately 5.5 x 7 cm and is  located in the midline. There is a small amount of gas in the bladder due to bowel communication. There is a fistulous tract between the sigmoid colon and the mass.  Several gas bubbles are present posterior to the sigmoid colon which may represent extra luminal gas. There is a small amount of free fluid. Small subcentimeter mesenteric lymph nodes are present which could be due to metastatic disease.  Negative for bowel obstruction. Appendix normal. Uterus is not enlarged and appears separate from the mass.  IMPRESSION: 5.5 x 7 cm mass lesion above the dome of the bladder with bladder invasion. There is also segmental thickening of the sigmoid colon. There is a fistulous communication between the mass in the sigmoid colon. Differential includes infection and tumor. I would favor tumor given the enhancing soft tissue mass lesion. This may be a urogenital neoplasm such is a bladder carcinoma or urachal carcinoma invading the sigmoid colon. Colon cancer invading the bladder is also possible. Mildly prominent mesenteric lymph nodes could be due to metastatic disease. Small gas bubbles are present posterior to sigmoid colon suggesting contained perforation. Small amount of free fluid.   Electronically Signed   By: Franchot Gallo M.D.   On: 10/17/2013 09:51    Medications:   . acetaminophen  1,000 mg Intravenous 4 times per day  . alvimopan  12 mg Oral BID  . ciprofloxacin  400 mg Intravenous Q12H  . feeding supplement (RESOURCE BREEZE)  1 Container Oral TID WC  . heparin subcutaneous  5,000 Units Subcutaneous 3 times per day  . HYDROmorphone PCA 0.3 mg/mL   Intravenous 6 times per day  . iron polysaccharides  150 mg Oral Daily  . metronidazole  500 mg Intravenous  3 times per day  . mupirocin ointment   Nasal BID  . pantoprazole  20 mg Oral Daily  . pneumococcal 23 valent vaccine  0.5 mL Intramuscular Tomorrow-1000  . polyethylene glycol  17 g Oral BID  . vitamin A & D       Continuous Infusions: .  sodium chloride Stopped (10/20/13 2353)  . bupivacaine ON-Q pain pump    . lactated ringers 125 mL/hr at 10/20/13 2350    Time spent: 35 minutes, the patient is medically complex and requires high complexity decision making.    LOS: 4 days   Vera Cruz  [facility] Pager [phone]. If unable to reach me by pager, please call my cell phone at [phone].  *Please refer to [email or web address], password [email or web address] to get updated schedule on who will round on this patient, as hospitalists switch teams weekly. If 7PM-7AM, please contact night-coverage at [email or web address], password [email or web address] for any overnight needs.  10/21/2013, 7:30 AM    **Disclaimer: This note was dictated with voice recognition software. Similar sounding words can inadvertently be transcribed and this note may contain transcription errors which may not have been corrected upon publication of note.**

## 2013-10-21 NOTE — Progress Notes (Signed)
Patient ID: Amy Bentley, female   DOB: [birth year], 43 y.o.   MRN: [id number] 1 Day Post-Op  Subjective: Amy Bentley is s/p resection of bladder dome and bowel segment for a enterovesical fistula.  She is doing well with expected post op soreness.   She has adequate output of clear urine and minimal JP drainage.    ROS:  Review of Systems  Constitutional: Negative for fever.  Gastrointestinal: Positive for abdominal pain. Negative for nausea.    Anti-infectives: Anti-infectives   Start     Dose/Rate Route Frequency Ordered Stop   10/20/13 1245  ertapenem (INVANZ) 1 g in sodium chloride 0.9 % 50 mL IVPB     1 g 100 mL/hr over 30 Minutes Intravenous  Once 10/20/13 1238 10/20/13 1241   10/17/13 2030  metroNIDAZOLE (FLAGYL) IVPB 500 mg     500 mg 100 mL/hr over 60 Minutes Intravenous 3 times per day 10/17/13 1847     10/17/13 2000  ciprofloxacin (CIPRO) IVPB 400 mg     400 mg 200 mL/hr over 60 Minutes Intravenous Every 12 hours 10/17/13 1847     10/17/13 0715  cefTRIAXone (ROCEPHIN) 1 g in dextrose 5 % 50 mL IVPB     1 g 100 mL/hr over 30 Minutes Intravenous  Once 10/17/13 0703 10/17/13 1308      Current Facility-Administered Medications  Medication Dose Route Frequency Provider Last Rate Last Dose  . 0.9 %  sodium chloride infusion   Intravenous Continuous Theodis Blaze, MD      . acetaminophen (OFIRMEV) IV 1,000 mg  1,000 mg Intravenous 4 times per day Stark Klein, MD   1,000 mg at 10/21/13 0612  . alum & mag hydroxide-simeth (MAALOX/MYLANTA) 200-200-20 MG/5ML suspension 30 mL  30 mL Oral PRN Venetia Maxon Rama, MD   30 mL at 10/18/13 1229  . alum & mag hydroxide-simeth (MAALOX/MYLANTA) 200-200-20 MG/5ML suspension 30 mL  30 mL Oral Q6H PRN Stark Klein, MD      . alvimopan (ENTEREG) capsule 12 mg  12 mg Oral BID Stark Klein, MD      . bupivacaine ON-Q pain pump   Other Continuous Stark Klein, MD      . ciprofloxacin (CIPRO) IVPB 400 mg  400 mg Intravenous Q12H Theodis Blaze, MD    400 mg at 10/20/13 2131  . diphenhydrAMINE (BENADRYL) injection 12.5 mg  12.5 mg Intravenous Q6H PRN Stark Klein, MD       Or  . diphenhydrAMINE (BENADRYL) 12.5 MG/5ML elixir 12.5 mg  12.5 mg Oral Q6H PRN Stark Klein, MD      . diphenhydrAMINE (BENADRYL) 12.5 MG/5ML elixir 12.5 mg  12.5 mg Oral Q6H PRN Stark Klein, MD       Or  . diphenhydrAMINE (BENADRYL) injection 12.5 mg  12.5 mg Intravenous Q6H PRN Stark Klein, MD      . feeding supplement (RESOURCE BREEZE) (RESOURCE BREEZE) liquid 1 Container  1 Container Oral TID WC Ivy Lynn Beaty, RD      . heparin injection 5,000 Units  5,000 Units Subcutaneous 3 times per day Stark Klein, MD   5,000 Units at 10/21/13 0612  . HYDROmorphone (DILAUDID) injection 0.5-1 mg  0.5-1 mg Intravenous Q4H PRN Stark Klein, MD   0.5 mg at 10/20/13 2102  . HYDROmorphone (DILAUDID) injection 1 mg  1 mg Intravenous Q2H PRN Theodis Blaze, MD   1 mg at 10/20/13 1016  . HYDROmorphone (DILAUDID) PCA injection 0.3 mg/mL   Intravenous 6 times per  day Stark Klein, MD      . iron polysaccharides (NIFEREX) capsule 150 mg  150 mg Oral Daily Venetia Maxon Rama, MD   150 mg at 10/19/13 1437  . lactated ringers infusion   Intravenous Continuous Lollie Sails, CRNA 125 mL/hr at 10/21/13 [phone]    . metroNIDAZOLE (FLAGYL) IVPB 500 mg  500 mg Intravenous 3 times per day Theodis Blaze, MD   500 mg at 10/21/13 6789  . mupirocin ointment (BACTROBAN) 2 %   Nasal BID Venetia Maxon Rama, MD   1 application at 38/10/17 2241  . naloxone [facility]) injection 0.4 mg  0.4 mg Intravenous PRN Stark Klein, MD       And  . sodium chloride 0.9 % injection 9 mL  9 mL Intravenous PRN Stark Klein, MD      . ondansetron (ZOFRAN) tablet 4 mg  4 mg Oral Q6H PRN Theodis Blaze, MD       Or  . ondansetron [facility]) injection 4 mg  4 mg Intravenous Q6H PRN Theodis Blaze, MD      . ondansetron [facility]) injection 4 mg  4 mg Intravenous Q6H PRN Stark Klein, MD      . oxyCODONE-acetaminophen  (PERCOCET/ROXICET) 5-325 MG per tablet 2 tablet  2 tablet Oral Q4H PRN Venetia Maxon Rama, MD   2 tablet at 10/19/13 1603  . pantoprazole (PROTONIX) EC tablet 20 mg  20 mg Oral Daily Venetia Maxon Rama, MD   20 mg at 10/19/13 1000  . pneumococcal 23 valent vaccine (PNU-IMMUNE) injection 0.5 mL  0.5 mL Intramuscular Tomorrow-1000 Theodis Blaze, MD      . polyethylene glycol [facility] / GLYCOLAX) packet 17 g  17 g Oral BID Earnstine Regal, PA-C   17 g at 10/19/13 2120  . promethazine (PHENERGAN) injection 6.25 mg  6.25 mg Intravenous Q6H PRN Stark Klein, MD         Objective: Vital signs in last 24 hours: Temp:  [97.6 F (36.4 C)-98.5 F (36.9 C)] 98 F (36.7 C) (04/25 0400) Pulse Rate:  [71-117] 97 (04/25 0612) Resp:  [11-22] 22 (04/25 0920) BP: (93-160)/(54-108) 125/77 mmHg (04/25 0612) SpO2:  [98 %-100 %] 100 % (04/25 0920) Weight:  [112.2 kg (247 lb 5.7 oz)] 112.2 kg (247 lb 5.7 oz) (04/24 1900)  Intake/Output from previous day: 04/24 0701 - 04/25 0700 In: 9237 [I.V.:6640; Blood:997; IV Piggyback:1600] Out: 5102 [Urine:475; Drains:130; Blood:950] Intake/Output this shift:     Physical Exam  Constitutional: She is well-developed, well-nourished, and in no distress.  Cardiovascular: Normal rate, regular rhythm and normal heart sounds.   Pulmonary/Chest: Effort normal and breath sounds normal. No respiratory distress.  Abdominal: Soft. She exhibits distension. There is tenderness.  BS absent  Genitourinary:  Foley draining well  Musculoskeletal: She exhibits no edema and no tenderness.    Lab Results:   Recent Labs  10/20/13 0328 10/20/13 1526 10/21/13 0335  WBC 4.7  --  13.3*  HGB 7.7* 10.5* 10.9*  HCT 25.1* 31.0* 34.4*  PLT 362  --  357   BMET  Recent Labs  10/20/13 1526 10/21/13 0335  NA 139 138  K 3.6* 4.2  CL  --  104  CO2  --  23  GLUCOSE 129* 119*  BUN  --  3*  CREATININE  --  0.75  CALCIUM  --  7.9*   PT/INR No results found for this basename:  LABPROT, INR,  in the last 72 hours ABG No  results found for this basename: PHART, PCO2, PO2, HCO3,  in the last 72 hours  Studies/Results: No results found.   Assessment: s/p Procedure(s): OPEN SIGMOID COLONECTOMY COLOVESICAL FISTULA REPAIR CYSTECTOMY PARTIAL  Doing well on POD #1.  Plan: Continue current care.      LOS: 4 days    Irine Seal 10/21/2013

## 2013-10-21 NOTE — Progress Notes (Signed)
Patient ID: Amy Bentley, female   DOB: Jan 07, 1971, 43 y.o.   MRN: [id number] 1 Day Post-Op  Subjective: Patient without complaints. Denies pain or nausea. Up out of bed. Once something to eat.  Objective: Vital signs in last 24 hours: Temp:  [97.6 F (36.4 C)-98.5 F (36.9 C)] 98 F (36.7 C) (04/25 0400) Pulse Rate:  [71-117] 97 (04/25 0612) Resp:  [11-19] 18 (04/25 0612) BP: (93-160)/(54-108) 125/77 mmHg (04/25 0612) SpO2:  [98 %-100 %] 100 % (04/25 0612) Weight:  [247 lb 5.7 oz (112.2 kg)] 247 lb 5.7 oz (112.2 kg) (04/24 1900) Last BM Date: 10/20/13  Intake/Output from previous day: 04/24 0701 - 04/25 0700 In: 9237 [I.V.:6640; Blood:997; IV Piggyback:1600] Out: 0347 [Urine:475; Drains:130; Blood:950] Intake/Output this shift:    General appearance: alert, cooperative, no distress and moderately obese Resp: clear to auscultation bilaterally GI: normal findings: soft, non-tender Incision/Wound: ddressing intact with slight bloody drainage  Lab Results:   Recent Labs  10/20/13 0328 10/20/13 1526 10/21/13 0335  WBC 4.7  --  13.3*  HGB 7.7* 10.5* 10.9*  HCT 25.1* 31.0* 34.4*  PLT 362  --  357   BMET  Recent Labs  10/20/13 1526 10/21/13 0335  NA 139 138  K 3.6* 4.2  CL  --  104  CO2  --  23  GLUCOSE 129* 119*  BUN  --  3*  CREATININE  --  0.75  CALCIUM  --  7.9*     Studies/Results: No results found.  Anti-infectives: Anti-infectives   Start     Dose/Rate Route Frequency Ordered Stop   10/20/13 1245  ertapenem (INVANZ) 1 g in sodium chloride 0.9 % 50 mL IVPB     1 g 100 mL/hr over 30 Minutes Intravenous  Once 10/20/13 1238 10/20/13 1241   10/17/13 2030  metroNIDAZOLE (FLAGYL) IVPB 500 mg     500 mg 100 mL/hr over 60 Minutes Intravenous 3 times per day 10/17/13 1847     10/17/13 2000  ciprofloxacin (CIPRO) IVPB 400 mg     400 mg 200 mL/hr over 60 Minutes Intravenous Every 12 hours 10/17/13 1847     10/17/13 0715  cefTRIAXone (ROCEPHIN) 1 g in  dextrose 5 % 50 mL IVPB     1 g 100 mL/hr over 30 Minutes Intravenous  Once 10/17/13 0703 10/17/13 0808      Assessment/Plan: s/p Procedure(s): OPEN SIGMOID COLONECTOMY COLOVESICAL FISTULA REPAIR CYSTECTOMY PARTIAL Doing very well postoperatively without apparent complications. Start clear liquid diet.   LOS: 4 days    Edward Jolly 10/21/2013

## 2013-10-22 LAB — CREATININE, FLUID (PLEURAL, PERITONEAL, JP DRAINAGE): Creat, Fluid: 0.7 mg/dL

## 2013-10-22 LAB — BASIC METABOLIC PANEL
BUN: 3 mg/dL — ABNORMAL LOW (ref 6–23)
CO2: 26 mEq/L (ref 19–32)
Calcium: 8.1 mg/dL — ABNORMAL LOW (ref 8.4–10.5)
Chloride: 100 mEq/L (ref 96–112)
Creatinine, Ser: 0.67 mg/dL (ref 0.50–1.10)
GFR calc Af Amer: >90 mL/min (ref >90)
GFR calc non Af Amer: >90 mL/min (ref >90)
Glucose, Bld: 124 mg/dL — ABNORMAL HIGH (ref 70–99)
Potassium: 3.6 mEq/L — ABNORMAL LOW (ref 3.7–5.3)
Sodium: 134 mEq/L — ABNORMAL LOW (ref 137–147)

## 2013-10-22 LAB — CBC
HCT: 29.8 % — ABNORMAL LOW (ref 36.0–46.0)
Hemoglobin: 9.4 g/dL — ABNORMAL LOW (ref 12.0–15.0)
MCH: 23.8 pg — ABNORMAL LOW (ref 26.0–34.0)
MCHC: 31.5 g/dL (ref 30.0–36.0)
MCV: 75.4 fL — ABNORMAL LOW (ref 78.0–100.0)
Platelets: 340 10*3/uL (ref 150–400)
RBC: 3.95 MIL/uL (ref 3.87–5.11)
RDW: 21.3 % — ABNORMAL HIGH (ref 11.5–15.5)
WBC: 11.5 10*3/uL — ABNORMAL HIGH (ref 4.0–10.5)

## 2013-10-22 MED ORDER — POTASSIUM CHLORIDE IN NACL 20-0.9 MEQ/L-% IV SOLN
INTRAVENOUS | Status: DC
  Administered 2013-10-22 – 2013-10-23 (×3): via INTRAVENOUS
  Administered 2013-10-24: 100 mL via INTRAVENOUS
  Administered 2013-10-24: 13:00:00 via INTRAVENOUS
  Administered 2013-10-25: 100 mL/h via INTRAVENOUS
  Administered 2013-10-25 – 2013-10-26 (×2): via INTRAVENOUS
  Filled 2013-10-22 (×12): qty 1000

## 2013-10-22 NOTE — Progress Notes (Signed)
Occupational Therapy Evaluation Patient Details Name: Amy Bentley MRN: [id number] DOB: September 01, 1970 Today's Date: 10/22/2013    History of Present Illness s/p resection of bladder dome and bowel segment 2* mass   Clinical Impression   Patient presents to OT with decreased ADL independence and safety s/p bladder resection. Will benefit from skilled OT to increased ADL independence and safety prior to d/c home.    Follow Up Recommendations  No OT follow up;Supervision - Intermittent    Equipment Recommendations  None recommended by OT (has all needed equipment)    Recommendations for Other Services       Precautions / Restrictions Precautions Precaution Comments: monitor HR Restrictions Weight Bearing Restrictions: No      Mobility                     Balance                                           ADL Overall ADL's : Needs assistance/impaired Eating/Feeding: Independent   Grooming: Independent       Lower Body Bathing: Moderate assistance       Lower Body Dressing: Moderate assistance               Functional mobility during ADLs: Min guard General ADL Comments: Observed pt ambulating with PT using RW with min guard Amy. HR elevated so recliner brought to pt. Patient has reacher and BSC at home. Began education on LB dressing with reacher. She does not wear socks and wears slip on shoes so focused on donning pants. Nurse in to fix IV (infiltrated?) and toilet transfer deferred.      Vision                     Perception     Praxis      Pertinent Vitals/Pain HR to 145 with ambulation, returned to 117 after 2 minutes rest     Hand Dominance     Extremity/Trunk Assessment Upper Extremity Assessment Upper Extremity Assessment: Overall WFL for tasks assessed   Lower Extremity Assessment Lower Extremity Assessment: Defer to PT evaluation   Cervical / Trunk Assessment Cervical / Trunk Assessment: Normal    Communication Communication Communication: No difficulties   Cognition Arousal/Alertness: Awake/alert Behavior During Therapy: WFL for tasks assessed/performed Overall Cognitive Status: Within Functional Limits for tasks assessed                     General Comments       Exercises       Shoulder Instructions      Home Living Family/patient expects to be discharged to:: Private residence Living Arrangements: Spouse/significant other Available Help at Discharge: Family;Available 24 hours/day Type of Home: House Home Access: Stairs to enter [employer] of Steps: 2 Entrance Stairs-Rails: None Home Layout: Multi-level Alternate Level Stairs-Number of Steps: 7 Alternate Level Stairs-Rails: Right Bathroom Shower/Tub: Teacher, Amy Bentley years/pre: Standard Bathroom Accessibility: Yes How Accessible: Accessible via walker Home Equipment: [place] - 2 wheels;Bedside commode;Crutches;Other (comment) (reacher)          Prior Functioning/Environment Level of Independence: Independent             OT Diagnosis: Generalized weakness;Acute pain   OT Problem List: Decreased strength;Decreased activity tolerance;Decreased knowledge of use of DME or AE   OT Treatment/Interventions: Self-care/ADL  training;Therapeutic exercise;DME and/or AE instruction;Therapeutic activities;Patient/family education    OT Goals(Current goals can be found in the care plan section) Acute Rehab OT Goals Patient Stated Goal: to walk with less pain OT Goal Formulation: With patient Time For Goal Achievement: 11/05/13 Potential to Achieve Goals: Good  OT Frequency: Min 2X/week   Barriers to D/C:            Co-evaluation              End of Session Equipment Utilized During Treatment: Rolling walker  Activity Tolerance: Patient tolerated treatment well Patient left: in chair;with call bell/phone within reach;with family/visitor present   Time: 0955-1010 OT Time  Calculation (min): 15 min Charges:  OT General Charges $OT Visit: 1 Procedure OT Evaluation $Initial OT Evaluation Tier I: 1 Procedure OT Treatments $Self Care/Home Management : 8-22 mins G-Codes:    Amy Bentley Amy Bentley 10-25-2013, 10:55 AM

## 2013-10-22 NOTE — Evaluation (Signed)
Physical Therapy Evaluation Patient Details Name: Amy Bentley MRN: [id number] DOB: 10-Jun-1971 Today's Date: 10/22/2013   History of Present Illness  s/p resection of bladder dome and bowel segment 2* mass  Clinical Impression  **Pt admitted with s/p bladder resection 2* pelvic mass**. Pt currently with functional limitations due to the deficits listed below (see PT Problem List).  Pt will benefit from skilled PT to increase their independence and safety with mobility to allow discharge to the venue listed below.   Pt ambulated 72' with RW, distance limited by therapist due to HR up to 145 with walking, HR 117 after 2 min rest. Good progress expected.   *    Follow Up Recommendations No PT follow up    Equipment Recommendations  None recommended by PT    Recommendations for Other Services OT consult     Precautions / Restrictions Precautions Precaution Comments: monitor HR Restrictions Weight Bearing Restrictions: No      Mobility  Bed Mobility Overal bed mobility: Needs Assistance Bed Mobility: Supine to Sit     Supine to sit: Mod assist     General bed mobility comments: assist to raise trunk  Transfers Overall transfer level: Needs assistance                  Ambulation/Gait Ambulation/Gait assistance: Supervision Ambulation Distance (Feet): 60 Feet Assistive device: Rolling walker (2 wheeled) Gait Pattern/deviations: Decreased stride length   Gait velocity interpretation: Below normal speed for age/gender General Gait Details: steady with RW, decr velocity 2* pain, distance limited by therapist due to HR up to 145 with walking, HR 117 after 2 minutes rest  Stairs            Wheelchair Mobility    Modified Rankin (Stroke Patients Only)       Balance Overall balance assessment: No apparent balance deficits (not formally assessed)                                           Pertinent Vitals/Pain **HR 145 with  walking HR 117 after 2 min rest 5/10 incisional pain with walking Pt using PCA*    Home Living Family/patient expects to be discharged to:: Private residence Living Arrangements: Spouse/significant other Available Help at Discharge: Family;Available 24 hours/day Type of Home: House Home Access: Stairs to enter Entrance Stairs-Rails: None Entrance Stairs-Number of Steps: 2 Home Layout: Multi-level Home Equipment: Walker - 2 wheels;Bedside commode;Crutches      Prior Function Level of Independence: Independent               Hand Dominance        Extremity/Trunk Assessment   Upper Extremity Assessment: Overall WFL for tasks assessed           Lower Extremity Assessment: Overall WFL for tasks assessed      Cervical / Trunk Assessment: Normal  Communication   Communication: No difficulties  Cognition Arousal/Alertness: Awake/alert Behavior During Therapy: WFL for tasks assessed/performed Overall Cognitive Status: Within Functional Limits for tasks assessed                      General Comments      Exercises        Assessment/Plan    PT Assessment Patient needs continued PT services  PT Diagnosis Acute pain;Difficulty walking   PT Problem List Decreased activity tolerance;Cardiopulmonary status limiting  activity;Decreased mobility  PT Treatment Interventions DME instruction;Gait training;Stair training;Functional mobility training;Therapeutic exercise;Patient/family education;Therapeutic activities   PT Goals (Current goals can be found in the Care Plan section) Acute Rehab PT Goals Patient Stated Goal: to walk with less pain PT Goal Formulation: With patient Time For Goal Achievement: 11/05/13 Potential to Achieve Goals: Good    Frequency Min 3X/week   Barriers to discharge        Co-evaluation               End of Session   Activity Tolerance: Treatment limited secondary to medical complications (Comment) (HR 145 with  walking) Patient left: in chair;with call bell/phone within reach;with family/visitor present Nurse Communication: Mobility status         Time: 0932-1000 PT Time Calculation (min): 28 min   Charges:   PT Evaluation $Initial PT Evaluation Tier I: 1 Procedure PT Treatments $Gait Training: 8-22 mins   PT G CodesLucile Crater 10/22/2013, 10:12 AM [phone]

## 2013-10-22 NOTE — Progress Notes (Signed)
General Surgery Note  LOS: 5 days  POD -  2 Days Post-Op  Assessment/Plan: 1.  OPEN SIGMOID COLONECTOMY COLOVESICAL FISTULA REPAIR, CYSTECTOMY PARTIAL - 10/20/2013 - [facility]  For colovesical fistula  On Entereg  On Cipro/Flagyl  Doing well.  On clear liquids.  2.  DVT prophylaxis - SQ Heparin 3.  Has foley - will be left in because of bladder repair.   Principal Problem:   Probable bladder cancer Active Problems:   Hypokalemia   Weight loss   Iron deficiency anemia, multifactorial with GI/GU and menstrual losses contributory   Diverticulitis of colon (without mention of hemorrhage)   Protein-calorie malnutrition, severe  Subjective:  Doing well.  Taking small clear liquids.  Husband in room.  Objective:   Filed Vitals:   10/22/13 0820  BP:   Pulse:   Temp:   Resp: 19     Intake/Output from previous day:  04/25 0701 - 04/26 0700 In: 4296.6 [P.O.:720; I.V.:2676.6; IV Piggyback:900] Out: 80 [Urine:900; Drains:90]  Intake/Output this shift:      Physical Exam:   General: Obese AA F who is alert and oriented.    HEENT: Normal. Pupils equal. .   Lungs: Clear.  IS at 1,6000 cc    Abdomen: Soft.  Quiet.   Wound: okay.  Has OnQ pump.     Lab Results:    Recent Labs  10/21/13 0335 10/22/13 0515  WBC 13.3* 11.5*  HGB 10.9* 9.4*  HCT 34.4* 29.8*  PLT 357 340    BMET   Recent Labs  10/21/13 0335 10/22/13 0515  NA 138 134*  K 4.2 3.6*  CL 104 100  CO2 23 26  GLUCOSE 119* 124*  BUN 3* 3*  CREATININE 0.75 0.67  CALCIUM 7.9* 8.1*    PT/INR  No results found for this basename: LABPROT, INR,  in the last 72 hours  ABG  No results found for this basename: PHART, PCO2, PO2, HCO3,  in the last 72 hours   Studies/Results:  No results found.   Anti-infectives:   Anti-infectives   Start     Dose/Rate Route Frequency Ordered Stop   10/20/13 1245  ertapenem (INVANZ) 1 g in sodium chloride 0.9 % 50 mL IVPB     1 g 100 mL/hr over 30 Minutes  Intravenous  Once 10/20/13 1238 10/20/13 1241   10/17/13 2030  metroNIDAZOLE (FLAGYL) IVPB 500 mg     500 mg 100 mL/hr over 60 Minutes Intravenous 3 times per day 10/17/13 1847     10/17/13 2000  ciprofloxacin (CIPRO) IVPB 400 mg     400 mg 200 mL/hr over 60 Minutes Intravenous Every 12 hours 10/17/13 1847     10/17/13 0715  cefTRIAXone (ROCEPHIN) 1 g in dextrose 5 % 50 mL IVPB     1 g 100 mL/hr over 30 Minutes Intravenous  Once 10/17/13 0703 10/17/13 0808      Alphonsa Overall, MD, FACS Pager: [facility] Office: [phone] 10/22/2013

## 2013-10-22 NOTE — Progress Notes (Signed)
Progress Note   Amy Bentley CVE:[id number] DOB: 09/21/1970 DOA: 10/17/2013 PCP: [name], MD   Brief Narrative:   Amy Bentley is an 43 y.o. female with a PMH of recurrent UTI was admitted 10/17/13 with abdominal pain, hematuria and weight loss of 30 pounds over the past one-2 months. Upon initial evaluation in the ED, the patient was noted to have hematuria, and a CT scan of the abdomen/pelvis showed a 5.5 X7 centimeter mass lesion above the dome of the bladder with bladder invasion. There was also sigmoid colon thickening and a possible contained microperforation.  Assessment/Plan:   Principal Problem:   Probable bladder versus colon cancer versus complicated diverticulitis  CEA 125 high at 44, CEA high at 7.8, AFP WNL at 1.9. Urine culture negative, but colo-vesicle fistula is present.  Status post cystoscopy 10/19/13 which showed a 2-3 cm inflammatory mass with surrounding bullous edema and a colovesical fistula. There is also a 5.5 X7 centimeter mass above the dome of the bladder.  Status post open sigmoid colonectomy and colovesical fistula repair with partial cystectomy 10/20/13. Pathology pending. Active Problems:   Hypokalemia  Add potassium to IV fluids.   Severe protein calorie malnutrition / Weight loss / morbid obesity  Has had 10% weight loss over the past 2-3 months and oral intake of less than 75% for greater than one month.  Seen by dietitian. Continue supplements per dietitian recommendations.   Microcytic anemia / Rectal bleeding  Likely iron deficiency from chronic GI with GU blood loss and menstrual losses all contributory.  Monitor hemoglobin and continue iron supplements. Hemoglobin stable postoperatively.   Diverticulitis of colon (without mention of hemorrhage)  Continue Cipro/Flagyl.   DVT Prophylaxis  Continue SCDs.  Code Status: Full. Family Communication: Husband updated at bedside 10/20/13. Disposition Plan: Home when  stable.   IV Access:    Peripheral IV   Procedures:    None.   Medical Consultants:    Dr. Carolan Clines, Urology  Dr. Stark Klein, Surgery  Other Consultants:    Dietitian   Anti-Infectives:    Cipro 10/17/13--->  Flagyl 10/17/13--->  Raynelle Jan 10/20/13  Subjective:   [employer] has some incisional pain, but no nausea or vomiting. No dyspnea. Has been ambulating.  No flatus or BMs, but feels gas moving through her intestines.  Objective:    Filed Vitals:   10/22/13 0048 10/22/13 0342 10/22/13 0515 10/22/13 0820  BP: 114/72  122/82   Pulse: 104  81   Temp: 98 F (36.7 C)  98.2 F (36.8 C)   TempSrc: Oral  Oral   Resp: 18 17 18 19   Height:      Weight:      SpO2: 94% 97% 96% 99%    Intake/Output Summary (Last 24 hours) at 10/22/13 0908 Last data filed at 10/22/13 0600  Gross per 24 hour  Intake 4296.57 ml  Output    990 ml  Net 3306.57 ml    Exam: Gen:  NAD Cardiovascular:  RRR, No M/R/G Respiratory:  Lungs CTAB Gastrointestinal:  Abdomen soft, NT/ND, + BS Extremities:  No C/E/C   Data Reviewed:    Labs: Basic Metabolic Panel:  Recent Labs Lab 10/17/13 0650 10/18/13 0320 10/20/13 1526 10/21/13 0335 10/22/13 0515  NA 140 138 139 138 134*  K 3.8 3.8 3.6* 4.2 3.6*  CL 102 105  --  104 100  CO2 24 23  --  23 26  GLUCOSE 110* 98 129* 119* 124*  BUN 6 5*  --  3* 3*  CREATININE 0.77 0.70  --  0.75 0.67  CALCIUM 9.6 8.4  --  7.9* 8.1*   GFR Estimated Creatinine Clearance: 108.6 ml/min (by C-G formula based on Cr of 0.67). Liver Function Tests:  Recent Labs Lab 10/17/13 0650  AST 14  ALT 9  ALKPHOS 94  BILITOT 0.3  PROT 7.4  ALBUMIN 3.3*   microcytic anemi CBC:  Recent Labs Lab 10/17/13 0650  10/18/13 0946 10/19/13 0355 10/19/13 1140 10/20/13 0328 10/20/13 1526 10/21/13 0335 10/22/13 0515  WBC 8.6  < > 5.1 8.2  --  4.7  --  13.3* 11.5*  NEUTROABS 6.5  --   --   --   --   --   --   --   --   HGB  9.3*  < > 8.1* 8.1* 8.7* 7.7* 10.5* 10.9* 9.4*  HCT 29.7*  < > 25.8* 27.4* 28.5* 25.1* 31.0* 34.4* 29.8*  MCV 67.8*  < > 68.3* 70.4*  --  68.8*  --  74.5* 75.4*  PLT 407*  < > 367 365  --  362  --  357 340  < > = values in this interval not displayed. Sepsis Labs:  Recent Labs Lab 10/19/13 0355 10/20/13 0328 10/21/13 0335 10/22/13 0515  WBC 8.2 4.7 13.3* 11.5*   Microbiology Recent Results (from the past 240 hour(s))  URINE CULTURE     Status: None   Collection Time    10/17/13  9:35 PM      Result Value Ref Range Status   Specimen Description URINE, CLEAN CATCH   Final   Special Requests NONE   Final   Culture  Setup Time     Final   Value: 10/18/2013 00:32     Performed at [facility] Count     Final   Value: NO GROWTH     Performed at [facility]   Culture     Final   Value: NO GROWTH     Performed at [facility]   Report Status 10/18/2013 FINAL   Final  SURGICAL PCR SCREEN     Status: None   Collection Time    10/20/13 10:55 AM      Result Value Ref Range Status   MRSA, PCR NEGATIVE  NEGATIVE Final   Staphylococcus aureus NEGATIVE  NEGATIVE Final   Comment:            The Xpert SA Assay (FDA     approved for NASAL specimens     in patients over 5 years of age),     is one component of     a comprehensive surveillance     program.  Test performance has     been validated by [employer] for patients greater     than or equal to 51 year old.     It is not intended     to diagnose infection nor to     guide or monitor treatment.     Radiographs/Studies:   Ct Abdomen Pelvis Wo Contrast  10/17/2013   CLINICAL DATA:  Right flank pain and hematuria. Dysuria. Recent UTI  EXAM: CT ABDOMEN AND PELVIS WITHOUT CONTRAST  TECHNIQUE: Multidetector CT imaging of the abdomen and pelvis was performed following the standard protocol without intravenous contrast.  COMPARISON:  None.  FINDINGS: Lung bases are clear. Unenhanced images of  the liver gallbladder and bile  ducts are normal. Pancreas and spleen are normal.  The kidneys show no obstruction or mass.  No urinary tract calculi.  Thickening of the sigmoid colon just above the urinary bladder. There is stranding in the pericolonic fat. There is a soft tissue structure above the bladder and below the sigmoid thickening containing gas bubbles, worrisome for abscess. This structure measures 6.5 x 5.8 cm. No free air or free fluid is identified. Urinary bladder appears to be empty and presumably is below this abscess. Further evaluation with oral and IV contrast is suggested.  Appendix is normal.  No acute bony change.  IMPRESSION: Negative for urinary tract calculi.  Thickening of the sigmoid colon consistent with diverticulitis. Just below the thickened sigmoid colon, there is a soft tissue mass containing gas bubbles, worrisome for abscess. Further evaluation with oral and IV contrast is suggested to confirm these findings.  I discussed the findings with Dr. Tamera Punt.   Electronically Signed   By: Franchot Gallo M.D.   On: 10/17/2013 07:39   Ct Abdomen Pelvis W Contrast  10/17/2013   CLINICAL DATA:  Pelvic pain with dysuria and hematuria. Rule out diverticulitis or tumor.  EXAM: CT ABDOMEN AND PELVIS WITH CONTRAST  TECHNIQUE: Multidetector CT imaging of the abdomen and pelvis was performed using the standard protocol following bolus administration of intravenous contrast.  CONTRAST:  139mL OMNIPAQUE IOHEXOL 300 MG/ML  SOLN  COMPARISON:  CT abdomen without contrast earlier today  FINDINGS: Lung bases are clear. Liver gallbladder and bile ducts are normal. Pancreas and spleen are normal. No renal obstruction or renal mass.  Segmental thickening of the sigmoid colon is again noted in the midline. There is mild stranding in the pericolonic fat. Irregular soft tissue mass is present above the bladder with invasion of the dome of the bladder. Delayed images of the bladder reveal irregularity of the  dome of the bladder with apparent invasion by the mass lesion. The appearance is most consistent with tumor rather than infection. This mass contains enhancing soft tissue as well as central fluid and gas. The mass measures approximately 5.5 x 7 cm and is located in the midline. There is a small amount of gas in the bladder due to bowel communication. There is a fistulous tract between the sigmoid colon and the mass.  Several gas bubbles are present posterior to the sigmoid colon which may represent extra luminal gas. There is a small amount of free fluid. Small subcentimeter mesenteric lymph nodes are present which could be due to metastatic disease.  Negative for bowel obstruction. Appendix normal. Uterus is not enlarged and appears separate from the mass.  IMPRESSION: 5.5 x 7 cm mass lesion above the dome of the bladder with bladder invasion. There is also segmental thickening of the sigmoid colon. There is a fistulous communication between the mass in the sigmoid colon. Differential includes infection and tumor. I would favor tumor given the enhancing soft tissue mass lesion. This may be a urogenital neoplasm such is a bladder carcinoma or urachal carcinoma invading the sigmoid colon. Colon cancer invading the bladder is also possible. Mildly prominent mesenteric lymph nodes could be due to metastatic disease. Small gas bubbles are present posterior to sigmoid colon suggesting contained perforation. Small amount of free fluid.   Electronically Signed   By: Franchot Gallo M.D.   On: 10/17/2013 09:51    Medications:   . alvimopan  12 mg Oral BID  . ciprofloxacin  400 mg Intravenous Q12H  . feeding supplement (  RESOURCE BREEZE)  1 Container Oral TID WC  . heparin subcutaneous  5,000 Units Subcutaneous 3 times per day  . HYDROmorphone PCA 0.3 mg/mL   Intravenous 6 times per day  . iron polysaccharides  150 mg Oral Daily  . metronidazole  500 mg Intravenous 3 times per day  . mupirocin ointment   Nasal BID   . pantoprazole  20 mg Oral Daily  . pneumococcal 23 valent vaccine  0.5 mL Intramuscular Tomorrow-1000  . polyethylene glycol  17 g Oral BID   Continuous Infusions: . sodium chloride 50 mL/hr at 10/22/13 0542  . bupivacaine ON-Q pain pump      Time spent: 25 minutes.    LOS: 5 days   [place]  [facility] Pager [phone]. If unable to reach me by pager, please call my cell phone at [phone].  *Please refer to [email or web address], password [email or web address] to get updated schedule on who will round on this patient, as hospitalists switch teams weekly. If 7PM-7AM, please contact night-coverage at [email or web address], password [email or web address] for any overnight needs.  10/22/2013, 9:08 AM    **Disclaimer: This note was dictated with voice recognition software. Similar sounding words can inadvertently be transcribed and this note may contain transcription errors which may not have been corrected upon publication of note.**

## 2013-10-22 NOTE — Progress Notes (Signed)
Patient ID: Amy Bentley, female   DOB: 18-May-1971, 43 y.o.   MRN: [id number] 2 Days Post-Op  Subjective: Amy Bentley is doing well with good pain control.  Her urine remains clear and the JP drainage remains low.  ROS:  Review of Systems  Constitutional: Negative for fever.  Gastrointestinal: Negative for nausea.    Anti-infectives: Anti-infectives   Start     Dose/Rate Route Frequency Ordered Stop   10/20/13 1245  ertapenem (INVANZ) 1 g in sodium chloride 0.9 % 50 mL IVPB     1 g 100 mL/hr over 30 Minutes Intravenous  Once 10/20/13 1238 10/20/13 1241   10/17/13 2030  metroNIDAZOLE (FLAGYL) IVPB 500 mg     500 mg 100 mL/hr over 60 Minutes Intravenous 3 times per day 10/17/13 1847     10/17/13 2000  ciprofloxacin (CIPRO) IVPB 400 mg     400 mg 200 mL/hr over 60 Minutes Intravenous Every 12 hours 10/17/13 1847     10/17/13 0715  cefTRIAXone (ROCEPHIN) 1 g in dextrose 5 % 50 mL IVPB     1 g 100 mL/hr over 30 Minutes Intravenous  Once 10/17/13 0703 10/17/13 4580      Current Facility-Administered Medications  Medication Dose Route Frequency Provider Last Rate Last Dose  . 0.9 %  sodium chloride infusion   Intravenous Continuous Theodis Blaze, MD 50 mL/hr at 10/22/13 0542    . alum & mag hydroxide-simeth (MAALOX/MYLANTA) 200-200-20 MG/5ML suspension 30 mL  30 mL Oral PRN Venetia Maxon Rama, MD   30 mL at 10/18/13 1229  . alum & mag hydroxide-simeth (MAALOX/MYLANTA) 200-200-20 MG/5ML suspension 30 mL  30 mL Oral Q6H PRN Stark Klein, MD      . alvimopan (ENTEREG) capsule 12 mg  12 mg Oral BID Stark Klein, MD   12 mg at 10/21/13 2226  . bupivacaine ON-Q pain pump   Other Continuous Stark Klein, MD      . ciprofloxacin (CIPRO) IVPB 400 mg  400 mg Intravenous Q12H Theodis Blaze, MD   400 mg at 10/21/13 2023  . diphenhydrAMINE (BENADRYL) injection 12.5 mg  12.5 mg Intravenous Q6H PRN Stark Klein, MD       Or  . diphenhydrAMINE (BENADRYL) 12.5 MG/5ML elixir 12.5 mg  12.5 mg Oral Q6H PRN  Stark Klein, MD      . diphenhydrAMINE (BENADRYL) 12.5 MG/5ML elixir 12.5 mg  12.5 mg Oral Q6H PRN Stark Klein, MD       Or  . diphenhydrAMINE (BENADRYL) injection 12.5 mg  12.5 mg Intravenous Q6H PRN Stark Klein, MD      . feeding supplement (RESOURCE BREEZE) (RESOURCE BREEZE) liquid 1 Container  1 Container Oral TID WC Hazle Coca, RD   1 Container at 10/21/13 1159  . heparin injection 5,000 Units  5,000 Units Subcutaneous 3 times per day Stark Klein, MD   5,000 Units at 10/22/13 0604  . HYDROmorphone (DILAUDID) injection 0.5-1 mg  0.5-1 mg Intravenous Q4H PRN Stark Klein, MD   0.5 mg at 10/22/13 0024  . HYDROmorphone (DILAUDID) injection 1 mg  1 mg Intravenous Q2H PRN Theodis Blaze, MD   1 mg at 10/20/13 1016  . HYDROmorphone (DILAUDID) PCA injection 0.3 mg/mL   Intravenous 6 times per day Stark Klein, MD   1.38 mg at 10/22/13 0820  . iron polysaccharides (NIFEREX) capsule 150 mg  150 mg Oral Daily Venetia Maxon Rama, MD   150 mg at 10/21/13 0956  . metroNIDAZOLE (FLAGYL) IVPB  500 mg  500 mg Intravenous 3 times per day Theodis Blaze, MD   500 mg at 10/22/13 0604  . mupirocin ointment (BACTROBAN) 2 %   Nasal BID Venetia Maxon Rama, MD      . naloxone [facility]) injection 0.4 mg  0.4 mg Intravenous PRN Stark Klein, MD       And  . sodium chloride 0.9 % injection 9 mL  9 mL Intravenous PRN Stark Klein, MD      . ondansetron (ZOFRAN) tablet 4 mg  4 mg Oral Q6H PRN Theodis Blaze, MD       Or  . ondansetron [facility]) injection 4 mg  4 mg Intravenous Q6H PRN Theodis Blaze, MD      . ondansetron [facility]) injection 4 mg  4 mg Intravenous Q6H PRN Stark Klein, MD      . oxyCODONE-acetaminophen (PERCOCET/ROXICET) 5-325 MG per tablet 2 tablet  2 tablet Oral Q4H PRN Venetia Maxon Rama, MD   2 tablet at 10/19/13 1603  . pantoprazole (PROTONIX) EC tablet 20 mg  20 mg Oral Daily Venetia Maxon Rama, MD   20 mg at 10/21/13 0956  . pneumococcal 23 valent vaccine (PNU-IMMUNE) injection 0.5 mL  0.5 mL  Intramuscular Tomorrow-1000 Theodis Blaze, MD      . polyethylene glycol [facility] / GLYCOLAX) packet 17 g  17 g Oral BID Earnstine Regal, PA-C   17 g at 10/21/13 2226  . promethazine (PHENERGAN) injection 6.25 mg  6.25 mg Intravenous Q6H PRN Stark Klein, MD         Objective: Vital signs in last 24 hours: Temp:  [98 F (36.7 C)-99.1 F (37.3 C)] 98.2 F (36.8 C) (04/26 0515) Pulse Rate:  [77-110] 81 (04/26 0515) Resp:  [13-22] 18 (04/26 0515) BP: (114-129)/(62-92) 122/82 mmHg (04/26 0515) SpO2:  [94 %-100 %] 96 % (04/26 0515)  Intake/Output from previous day: 04/25 0701 - 04/26 0700 In: 4296.6 [P.O.:720; I.V.:2676.6; IV Piggyback:900] Out: 21 [Urine:900; Drains:90] Intake/Output this shift:     Physical Exam  Constitutional: She is well-developed, well-nourished, and in no distress. No distress.    Lab Results:   Recent Labs  10/21/13 0335 10/22/13 0515  WBC 13.3* 11.5*  HGB 10.9* 9.4*  HCT 34.4* 29.8*  PLT 357 340   BMET  Recent Labs  10/21/13 0335 10/22/13 0515  NA 138 134*  K 4.2 3.6*  CL 104 100  CO2 23 26  GLUCOSE 119* 124*  BUN 3* 3*  CREATININE 0.75 0.67  CALCIUM 7.9* 8.1*   PT/INR No results found for this basename: LABPROT, INR,  in the last 72 hours ABG No results found for this basename: PHART, PCO2, PO2, HCO3,  in the last 72 hours  Studies/Results: No results found.   Assessment: s/p Procedure(s): OPEN SIGMOID COLONECTOMY COLOVESICAL FISTULA REPAIR CYSTECTOMY PARTIAL  Path pending.   Plan: Check JP fluid for creatinine and if serum level ok to DC drain from a urologic standpoint.      LOS: 5 days    Irine Seal 10/22/2013

## 2013-10-23 ENCOUNTER — Encounter (HOSPITAL_COMMUNITY): Payer: Self-pay | Admitting: General Surgery

## 2013-10-23 LAB — BASIC METABOLIC PANEL
BUN: 3 mg/dL — ABNORMAL LOW (ref 6–23)
CO2: 26 mEq/L (ref 19–32)
Calcium: 7.9 mg/dL — ABNORMAL LOW (ref 8.4–10.5)
Chloride: 99 mEq/L (ref 96–112)
Creatinine, Ser: 0.66 mg/dL (ref 0.50–1.10)
GFR calc Af Amer: >90 mL/min (ref >90)
GFR calc non Af Amer: >90 mL/min (ref >90)
Glucose, Bld: 120 mg/dL — ABNORMAL HIGH (ref 70–99)
Potassium: 3.4 mEq/L — ABNORMAL LOW (ref 3.7–5.3)
Sodium: 135 mEq/L — ABNORMAL LOW (ref 137–147)

## 2013-10-23 LAB — CBC
HCT: 27.7 % — ABNORMAL LOW (ref 36.0–46.0)
Hemoglobin: 8.6 g/dL — ABNORMAL LOW (ref 12.0–15.0)
MCH: 23.7 pg — ABNORMAL LOW (ref 26.0–34.0)
MCHC: 31 g/dL (ref 30.0–36.0)
MCV: 76.3 fL — ABNORMAL LOW (ref 78.0–100.0)
Platelets: 321 10*3/uL (ref 150–400)
RBC: 3.63 MIL/uL — ABNORMAL LOW (ref 3.87–5.11)
RDW: 22.2 % — ABNORMAL HIGH (ref 11.5–15.5)
WBC: 8.5 10*3/uL (ref 4.0–10.5)

## 2013-10-23 MED ORDER — POTASSIUM CHLORIDE CRYS ER 20 MEQ PO TBCR
30.0000 meq | EXTENDED_RELEASE_TABLET | Freq: Once | ORAL | Status: AC
  Administered 2013-10-23: 30 meq via ORAL
  Filled 2013-10-23: qty 1

## 2013-10-23 MED ORDER — POTASSIUM CHLORIDE 10 MEQ/100ML IV SOLN
10.0000 meq | INTRAVENOUS | Status: AC
  Administered 2013-10-23 (×4): 10 meq via INTRAVENOUS
  Filled 2013-10-23 (×4): qty 100

## 2013-10-23 NOTE — Progress Notes (Signed)
Patient ID: Amy Bentley, female   DOB: Mar 03, 1971, 43 y.o.   MRN: [id number] 3 Days Post-Op  Subjective: Pt feels ok.  Some nausea this morning.  Feels more bloated.  No passing much from below  Objective: Vital signs in last 24 hours: Temp:  [98.1 F (36.7 C)-98.9 F (37.2 C)] 98.1 F (36.7 C) (04/27 0502) Pulse Rate:  [93-96] 95 (04/27 0502) Resp:  [14-22] 17 (04/27 0743) BP: (103-115)/(69-75) 109/71 mmHg (04/27 0502) SpO2:  [97 %-100 %] 99 % (04/27 0743) Last BM Date: 10/20/13  Intake/Output from previous day: 04/26 0701 - 04/27 0700 In: 3308.3 [P.O.:720; I.V.:1888.3; IV Piggyback:700] Out: 875 [Urine:750; Drains:125] Intake/Output this shift:    PE: Abd: soft, some distention, but does have some BS, incision c/d/i with wicks present.  On Q in place  Lab Results:   Recent Labs  10/22/13 0515 10/23/13 0405  WBC 11.5* 8.5  HGB 9.4* 8.6*  HCT 29.8* 27.7*  PLT 340 321   BMET  Recent Labs  10/22/13 0515 10/23/13 0405  NA 134* 135*  K 3.6* 3.4*  CL 100 99  CO2 26 26  GLUCOSE 124* 120*  BUN 3* 3*  CREATININE 0.67 0.66  CALCIUM 8.1* 7.9*   PT/INR No results found for this basename: LABPROT, INR,  in the last 72 hours CMP     Component Value Date/Time   NA 135* 10/23/2013 0405   K 3.4* 10/23/2013 0405   CL 99 10/23/2013 0405   CO2 26 10/23/2013 0405   GLUCOSE 120* 10/23/2013 0405   BUN 3* 10/23/2013 0405   CREATININE 0.66 10/23/2013 0405   CALCIUM 7.9* 10/23/2013 0405   PROT 7.4 10/17/2013 0650   ALBUMIN 3.3* 10/17/2013 0650   AST 14 10/17/2013 0650   ALT 9 10/17/2013 0650   ALKPHOS 94 10/17/2013 0650   BILITOT 0.3 10/17/2013 0650   GFRNONAA >90 10/23/2013 0405   GFRAA >90 10/23/2013 0405   Lipase  No results found for this basename: lipase       Studies/Results: No results found.  Anti-infectives: Anti-infectives   Start     Dose/Rate Route Frequency Ordered Stop   10/20/13 1245  ertapenem (INVANZ) 1 g in sodium chloride 0.9 % 50 mL IVPB     1  g 100 mL/hr over 30 Minutes Intravenous  Once 10/20/13 1238 10/20/13 1241   10/17/13 2030  metroNIDAZOLE (FLAGYL) IVPB 500 mg     500 mg 100 mL/hr over 60 Minutes Intravenous 3 times per day 10/17/13 1847     10/17/13 2000  ciprofloxacin (CIPRO) IVPB 400 mg     400 mg 200 mL/hr over 60 Minutes Intravenous Every 12 hours 10/17/13 1847     10/17/13 0715  cefTRIAXone (ROCEPHIN) 1 g in dextrose 5 % 50 mL IVPB     1 g 100 mL/hr over 30 Minutes Intravenous  Once 10/17/13 0703 10/17/13 0808       Assessment/Plan  1. POD 3 S/p ex lap,  OPEN SIGMOID COLONECTOMY COLOVESICAL FISTULA REPAIR, CYSTECTOMY PARTIAL for colovesical fistula 2. Anemia, likely related to surgery  Plan: 1. Cont entereg 2. Due to some nausea today and bloating, I will back diet to sips and chips.  We have  Discussed mobilization to help with bowel function. 3. Will STOP her heparin as her hgb is drifting by a gram the last several days.  Cont SCDs. 4. Await pathology 5. Leave foley in for 2 weeks per urology   LOS: 6 days  Henreitta Cea 10/23/2013, 10:07 AM Pager: [phone]

## 2013-10-23 NOTE — Progress Notes (Signed)
Progress Note   Amy Bentley SAY:[id number] DOB: 01/01/1971 DOA: 10/17/2013 PCP: [name], MD   Brief Narrative:   Amy Bentley is an 43 y.o. female with a PMH of recurrent UTI was admitted 10/17/13 with abdominal pain, hematuria and weight loss of 30 pounds over the past one-2 months. Upon initial evaluation in the ED, the patient was noted to have hematuria, and a CT scan of the abdomen/pelvis showed a 5.5 X7 centimeter mass lesion above the dome of the bladder with bladder invasion. There was also sigmoid colon thickening and a possible contained microperforation.  Assessment/Plan:   Principal Problem:   Probable bladder versus colon cancer versus complicated diverticulitis  CEA 125 high at 44, CEA high at 7.8, AFP WNL at 1.9. Urine culture negative, but colo-vesicle fistula is present.  Status post cystoscopy 10/19/13 which showed a 2-3 cm inflammatory mass with surrounding bullous edema and a colovesical fistula. There is also a 5.5 X7 centimeter mass above the dome of the bladder.  Status post open sigmoid colonectomy and colovesical fistula repair with partial cystectomy 10/20/13. Pathology pending. Active Problems:   Hypokalemia  Will give for runs of potassium today. Potassium lower despite potassium being added to IV fluids.   Severe protein calorie malnutrition / Weight loss / morbid obesity  Has had 10% weight loss over the past 2-3 months and oral intake of less than 75% for greater than one month.  Seen by dietitian. Continue supplements per dietitian recommendations.   Microcytic anemia / Rectal bleeding  Likely iron deficiency from chronic GI with GU blood loss and menstrual losses all contributory.  Receive 2 units of packed red blood cells 10/20/13.  Monitor hemoglobin and continue iron supplements. Hemoglobin slowly trending down postoperatively.   Diverticulitis of colon (without mention of hemorrhage)  Continue Cipro/Flagyl.   DVT  Prophylaxis  Continue SCDs.  Code Status: Full. Family Communication: Husband updated at bedside 10/20/13. Disposition Plan: Home when stable.   IV Access:    Peripheral IV   Procedures:    Sigmoid colectomy, take down of colovesical fistula by Dr. [name] on 10/20/13.  Partial cystectomy by Dr. Louis Meckel on 10/20/13.  Medical Consultants:    Dr. Carolan Clines, Urology  Dr. Stark Klein, Surgery  Other Consultants:    Dietitian   Anti-Infectives:    Cipro 10/17/13--->  Flagyl 10/17/13--->  Raynelle Jan 10/20/13  Subjective:   Amy Bentley has continues to have some incisional pain, but no nausea or vomiting. No dyspnea. Has been ambulating.  No flatus or BMs yet, but feels bloated with gas. Diet downgraded to ice chips only.  Objective:    Filed Vitals:   10/23/13 0502 10/23/13 0743 10/23/13 1217 10/23/13 1429  BP: 109/71   129/83  Pulse: 95   72  Temp: 98.1 F (36.7 C)   98.4 F (36.9 C)  TempSrc: Oral   Oral  Resp: 18 17 13 18   Height:      Weight:      SpO2: 98% 99% 99% 99%    Intake/Output Summary (Last 24 hours) at 10/23/13 1542 Last data filed at 10/23/13 1429  Gross per 24 hour  Intake 3279.99 ml  Output   1195 ml  Net 2084.99 ml    Exam: Gen:  NAD Cardiovascular:  RRR, No M/R/G Respiratory:  Lungs CTAB Gastrointestinal:  Abdomen softly distended, tender around the incisional site, + BS Extremities:  No C/E/C   Data Reviewed:    Labs: Basic Metabolic Panel:  Recent Labs Lab 10/17/13 0650 10/18/13 0320 10/20/13 1526 10/21/13 0335 10/22/13 0515 10/23/13 0405  NA 140 138 139 138 134* 135*  K 3.8 3.8 3.6* 4.2 3.6* 3.4*  CL 102 105  --  104 100 99  CO2 24 23  --  23 26 26   GLUCOSE 110* 98 129* 119* 124* 120*  BUN 6 5*  --  3* 3* 3*  CREATININE 0.77 0.70  --  0.75 0.67 0.66  CALCIUM 9.6 8.4  --  7.9* 8.1* 7.9*   GFR Estimated Creatinine Clearance: 108.6 ml/min (by C-G formula based on Cr of 0.66). Liver Function  Tests:  Recent Labs Lab 10/17/13 0650  AST 14  ALT 9  ALKPHOS 94  BILITOT 0.3  PROT 7.4  ALBUMIN 3.3*   microcytic anemi CBC:  Recent Labs Lab 10/17/13 0650  10/19/13 0355  10/20/13 0328 10/20/13 1526 10/21/13 0335 10/22/13 0515 10/23/13 0405  WBC 8.6  < > 8.2  --  4.7  --  13.3* 11.5* 8.5  NEUTROABS 6.5  --   --   --   --   --   --   --   --   HGB 9.3*  < > 8.1*  < > 7.7* 10.5* 10.9* 9.4* 8.6*  HCT 29.7*  < > 27.4*  < > 25.1* 31.0* 34.4* 29.8* 27.7*  MCV 67.8*  < > 70.4*  --  68.8*  --  74.5* 75.4* 76.3*  PLT 407*  < > 365  --  362  --  357 340 321  < > = values in this interval not displayed. Sepsis Labs:  Recent Labs Lab 10/20/13 0328 10/21/13 0335 10/22/13 0515 10/23/13 0405  WBC 4.7 13.3* 11.5* 8.5   Microbiology Recent Results (from the past 240 hour(s))  URINE CULTURE     Status: None   Collection Time    10/17/13  9:35 PM      Result Value Ref Range Status   Specimen Description URINE, CLEAN CATCH   Final   Special Requests NONE   Final   Culture  Setup Time     Final   Value: 10/18/2013 00:32     Performed at [facility] Count     Final   Value: NO GROWTH     Performed at [facility]   Culture     Final   Value: NO GROWTH     Performed at [facility]   Report Status 10/18/2013 FINAL   Final  SURGICAL PCR SCREEN     Status: None   Collection Time    10/20/13 10:55 AM      Result Value Ref Range Status   MRSA, PCR NEGATIVE  NEGATIVE Final   Staphylococcus aureus NEGATIVE  NEGATIVE Final   Comment:            The Xpert SA Assay (FDA     approved for NASAL specimens     in patients over 21 years of age),     is one component of     a comprehensive surveillance     program.  Test performance has     been validated by [employer] for patients greater     than or equal to 34 year old.     It is not intended     to diagnose infection nor to     guide or monitor treatment.     Radiographs/Studies:  Ct Abdomen Pelvis Wo Contrast  10/17/2013   CLINICAL DATA:  Right flank pain and hematuria. Dysuria. Recent UTI  EXAM: CT ABDOMEN AND PELVIS WITHOUT CONTRAST  TECHNIQUE: Multidetector CT imaging of the abdomen and pelvis was performed following the standard protocol without intravenous contrast.  COMPARISON:  None.  FINDINGS: Lung bases are clear. Unenhanced images of the liver gallbladder and bile ducts are normal. Pancreas and spleen are normal.  The kidneys show no obstruction or mass.  No urinary tract calculi.  Thickening of the sigmoid colon just above the urinary bladder. There is stranding in the pericolonic fat. There is a soft tissue structure above the bladder and below the sigmoid thickening containing gas bubbles, worrisome for abscess. This structure measures 6.5 x 5.8 cm. No free air or free fluid is identified. Urinary bladder appears to be empty and presumably is below this abscess. Further evaluation with oral and IV contrast is suggested.  Appendix is normal.  No acute bony change.  IMPRESSION: Negative for urinary tract calculi.  Thickening of the sigmoid colon consistent with diverticulitis. Just below the thickened sigmoid colon, there is a soft tissue mass containing gas bubbles, worrisome for abscess. Further evaluation with oral and IV contrast is suggested to confirm these findings.  I discussed the findings with Dr. Fredderick Phenix.   Electronically Signed   By: Marlan Palau M.D.   On: 10/17/2013 07:39   Ct Abdomen Pelvis W Contrast  10/17/2013   CLINICAL DATA:  Pelvic pain with dysuria and hematuria. Rule out diverticulitis or tumor.  EXAM: CT ABDOMEN AND PELVIS WITH CONTRAST  TECHNIQUE: Multidetector CT imaging of the abdomen and pelvis was performed using the standard protocol following bolus administration of intravenous contrast.  CONTRAST:  OMNIPAQUE IOHEXOL 300 MG/ML  SOLN  COMPARISON:  CT abdomen without contrast earlier today  FINDINGS: Lung bases are clear. Liver  gallbladder and bile ducts are normal. Pancreas and spleen are normal. No renal obstruction or renal mass.  Segmental thickening of the sigmoid colon is again noted in the midline. There is mild stranding in the pericolonic fat. Irregular soft tissue mass is present above the bladder with invasion of the dome of the bladder. Delayed images of the bladder reveal irregularity of the dome of the bladder with apparent invasion by the mass lesion. The appearance is most consistent with tumor rather than infection. This mass contains enhancing soft tissue as well as central fluid and gas. The mass measures approximately 5.5 x 7 cm and is located in the midline. There is a small amount of gas in the bladder due to bowel communication. There is a fistulous tract between the sigmoid colon and the mass.  Several gas bubbles are present posterior to the sigmoid colon which may represent extra luminal gas. There is a small amount of free fluid. Small subcentimeter mesenteric lymph nodes are present which could be due to metastatic disease.  Negative for bowel obstruction. Appendix normal. Uterus is not enlarged and appears separate from the mass.  IMPRESSION: 5.5 x 7 cm mass lesion above the dome of the bladder with bladder invasion. There is also segmental thickening of the sigmoid colon. There is a fistulous communication between the mass in the sigmoid colon. Differential includes infection and tumor. I would favor tumor given the enhancing soft tissue mass lesion. This may be a urogenital neoplasm such is a bladder carcinoma or urachal carcinoma invading the sigmoid colon. Colon cancer invading the bladder is also possible. Mildly prominent mesenteric lymph  nodes could be due to metastatic disease. Small gas bubbles are present posterior to sigmoid colon suggesting contained perforation. Small amount of free fluid.   Electronically Signed   By: Franchot Gallo M.D.   On: 10/17/2013 09:51    Medications:   . alvimopan   12 mg Oral BID  . ciprofloxacin  400 mg Intravenous Q12H  . feeding supplement (RESOURCE BREEZE)  1 Container Oral TID WC  . HYDROmorphone PCA 0.3 mg/mL   Intravenous 6 times per day  . iron polysaccharides  150 mg Oral Daily  . metronidazole  500 mg Intravenous 3 times per day  . mupirocin ointment   Nasal BID  . pantoprazole  20 mg Oral Daily  . pneumococcal 23 valent vaccine  0.5 mL Intramuscular Tomorrow-1000  . polyethylene glycol  17 g Oral BID   Continuous Infusions: . 0.9 % NaCl with KCl 20 mEq / L 100 mL/hr at 10/23/13 1216  . bupivacaine ON-Q pain pump      Time spent: 25 minutes.    LOS: 6 days   [place]  [facility] Pager [phone]. If unable to reach me by pager, please call my cell phone at [phone].  *Please refer to [email or web address], password [email or web address] to get updated schedule on who will round on this patient, as hospitalists switch teams weekly. If 7PM-7AM, please contact night-coverage at [email or web address], password [email or web address] for any overnight needs.  10/23/2013, 3:42 PM    **Disclaimer: This note was dictated with voice recognition software. Similar sounding words can inadvertently be transcribed and this note may contain transcription errors which may not have been corrected upon publication of note.**

## 2013-10-23 NOTE — Progress Notes (Signed)
Occupational Therapy Treatment Patient Details Name: Coletta Lockner MRN: [id number] DOB: 1970-12-14 Today's Date: 10/23/2013    History of present illness s/p resection of bladder dome and bowel segment 2* mass      Follow Up Recommendations  Supervision - Intermittent;Home health OT    Equipment Recommendations  None recommended by OT (has all needed equipment)       Precautions / Restrictions Precautions Precaution Comments: monitor HR Restrictions Weight Bearing Restrictions: No       Mobility Bed Mobility Overal bed mobility: Needs Assistance Bed Mobility: Supine to Sit     Supine to sit: Supervision        Transfers Overall transfer level: Needs assistance   Transfers: Sit to/from Stand;Stand Pivot Transfers   Stand pivot transfers: Min guard            Balance                                   ADL       Grooming: Set up       Lower Body Bathing: Moderate assistance;Sit to/from stand                       Functional mobility during ADLs: Minimal assistance General ADL Comments: HR 126 with transfer to chair       Cognition   Behavior During Therapy: [facility] for tasks assessed/performed Overall Cognitive Status: Within Functional Limits for tasks assessed                       Extremity/Trunk Assessment                Prior Functioning/Environment              Frequency Min 2X/week     Progress Toward Goals  OT Goals(current goals can now be found in the care plan section)  Progress towards OT goals: Progressing toward goals  Acute Rehab OT Goals Patient Stated Goal: to walk with less pain OT Goal Formulation: With patient  Plan Discharge plan needs to be updated       End of Session Equipment Utilized During Treatment: Rolling walker   Activity Tolerance Patient tolerated treatment well   Patient Left in chair;with call bell/phone within reach;with family/visitor present            Time: 0920-0943 OT Time Calculation (min): 23 min  Charges: OT General Charges $OT Visit: 1 Procedure OT Treatments $Self Care/Home Management : 23-37 mins  Betsy Pries 10/23/2013, 10:25 AM

## 2013-10-23 NOTE — Progress Notes (Signed)
Physical Therapy Treatment Patient Details Name: Keyshawna Prouse MRN: [id number] DOB: 01-Sep-1970 Today's Date: 10/23/2013    History of Present Illness s/p resection of bladder dome and bowel segment 2* mass    PT Comments    Pt OOB in recliner.  Amb around unit twice with RW due to ABD discomfort.  Used PCA x 2.  Required increased time.  Mild c/o ABD bloating.    Follow Up Recommendations  No PT follow up     Equipment Recommendations       Recommendations for Other Services       Precautions / Restrictions Precautions Precaution Comments: monitor HR Restrictions Weight Bearing Restrictions: No    Mobility  Bed Mobility Overal bed mobility: Needs Assistance Bed Mobility: Supine to Sit     Supine to sit: Supervision     General bed mobility comments: Pt OOB in recliner  Transfers Overall transfer level: Needs assistance Equipment used: None Transfers: Sit to/from Stand   Stand pivot transfers: Min guard          Ambulation/Gait Ambulation/Gait assistance: Min guard;Supervision Ambulation Distance (Feet): 800 Feet Assistive device: Rolling walker (2 wheeled)   Gait velocity: WFL   General Gait Details: steady with RW, decr velocity 2* pain, distance limited by therapist due to HR up to 135 with walking.  PCA x 2 for ABD discomfot/tightness   Stairs            Wheelchair Mobility    Modified Rankin (Stroke Patients Only)       Balance                                    Cognition Arousal/Alertness: Awake/alert Behavior During Therapy: WFL for tasks assessed/performed Overall Cognitive Status: Within Functional Limits for tasks assessed                      Exercises      General Comments        Pertinent Vitals/Pain     Home Living                      Prior Function            PT Goals (current goals can now be found in the care plan section) Acute Rehab PT Goals Patient Stated Goal:  to walk with less pain Progress towards PT goals: Progressing toward goals    Frequency  Min 3X/week    PT Plan      Co-evaluation             End of Session Equipment Utilized During Treatment: Gait belt Activity Tolerance: Patient tolerated treatment well Patient left: in chair;with call bell/phone within reach;with family/visitor present     Time: 1137-1202 PT Time Calculation (min): 25 min  Charges:  $Gait Training: 23-37 mins                    G Codes:      Rica Koyanagi  PTA [facility]  Acute  Rehab Pager      [phone]

## 2013-10-23 NOTE — Progress Notes (Signed)
24 hour PCA totals:  3.39 mg  21 demands 17 delivered  Iantha Fallen RN 4:55 AM 10/23/2013

## 2013-10-23 NOTE — Progress Notes (Signed)
Urology Inpatient Progress Report S/p sigmoid resection/partial cystectomy for bulky pelvic mass and colovesical fistula.  Intv/Subj: No acute events overnight. Patient is without complaint. JP drain output consistent with serosanguinous fluid. Pain is reasonably well controlled - up and down to bathroom several times, no BM  History reviewed. No pertinent past medical history. Current Facility-Administered Medications  Medication Dose Route Frequency Provider Last Rate Last Dose  . 0.9 % NaCl with KCl 20 mEq/ L  infusion   Intravenous Continuous Venetia Maxon Rama, MD 100 mL/hr at 10/23/13 0557    . alum & mag hydroxide-simeth (MAALOX/MYLANTA) 200-200-20 MG/5ML suspension 30 mL  30 mL Oral PRN Venetia Maxon Rama, MD   30 mL at 10/18/13 1229  . alum & mag hydroxide-simeth (MAALOX/MYLANTA) 200-200-20 MG/5ML suspension 30 mL  30 mL Oral Q6H PRN Stark Klein, MD      . alvimopan (ENTEREG) capsule 12 mg  12 mg Oral BID Stark Klein, MD   12 mg at 10/22/13 2255  . bupivacaine ON-Q pain pump   Other Continuous Stark Klein, MD      . ciprofloxacin (CIPRO) IVPB 400 mg  400 mg Intravenous Q12H Theodis Blaze, MD   400 mg at 10/23/13 0741  . diphenhydrAMINE (BENADRYL) injection 12.5 mg  12.5 mg Intravenous Q6H PRN Stark Klein, MD       Or  . diphenhydrAMINE (BENADRYL) 12.5 MG/5ML elixir 12.5 mg  12.5 mg Oral Q6H PRN Stark Klein, MD      . diphenhydrAMINE (BENADRYL) 12.5 MG/5ML elixir 12.5 mg  12.5 mg Oral Q6H PRN Stark Klein, MD       Or  . diphenhydrAMINE (BENADRYL) injection 12.5 mg  12.5 mg Intravenous Q6H PRN Stark Klein, MD      . feeding supplement (RESOURCE BREEZE) (RESOURCE BREEZE) liquid 1 Container  1 Container Oral TID WC Hazle Coca, RD   1 Container at 10/23/13 [phone]  . heparin injection 5,000 Units  5,000 Units Subcutaneous 3 times per day Stark Klein, MD   5,000 Units at 10/23/13 0452  . HYDROmorphone (DILAUDID) injection 0.5-1 mg  0.5-1 mg Intravenous Q4H PRN Stark Klein, MD    0.5 mg at 10/22/13 0024  . HYDROmorphone (DILAUDID) injection 1 mg  1 mg Intravenous Q2H PRN Theodis Blaze, MD   1 mg at 10/20/13 1016  . HYDROmorphone (DILAUDID) PCA injection 0.3 mg/mL   Intravenous 6 times per day Stark Klein, MD   1.93 mg at 10/23/13 0743  . iron polysaccharides (NIFEREX) capsule 150 mg  150 mg Oral Daily Venetia Maxon Rama, MD   150 mg at 10/22/13 0858  . metroNIDAZOLE (FLAGYL) IVPB 500 mg  500 mg Intravenous 3 times per day Theodis Blaze, MD   500 mg at 10/23/13 0452  . mupirocin ointment (BACTROBAN) 2 %   Nasal BID Venetia Maxon Rama, MD      . naloxone [facility]) injection 0.4 mg  0.4 mg Intravenous PRN Stark Klein, MD       And  . sodium chloride 0.9 % injection 9 mL  9 mL Intravenous PRN Stark Klein, MD      . ondansetron (ZOFRAN) tablet 4 mg  4 mg Oral Q6H PRN Theodis Blaze, MD       Or  . ondansetron [facility]) injection 4 mg  4 mg Intravenous Q6H PRN Theodis Blaze, MD      . ondansetron [facility]) injection 4 mg  4 mg Intravenous Q6H PRN Stark Klein, MD  4 mg at 10/23/13 0739  . oxyCODONE-acetaminophen (PERCOCET/ROXICET) 5-325 MG per tablet 2 tablet  2 tablet Oral Q4H PRN Venetia Maxon Rama, MD   2 tablet at 10/19/13 1603  . pantoprazole (PROTONIX) EC tablet 20 mg  20 mg Oral Daily Venetia Maxon Rama, MD   20 mg at 10/22/13 0858  . pneumococcal 23 valent vaccine (PNU-IMMUNE) injection 0.5 mL  0.5 mL Intramuscular Tomorrow-1000 Theodis Blaze, MD      . polyethylene glycol (MIRALAX / GLYCOLAX) packet 17 g  17 g Oral BID Earnstine Regal, PA-C   17 g at 10/22/13 2255  . promethazine (PHENERGAN) injection 6.25 mg  6.25 mg Intravenous Q6H PRN Stark Klein, MD         Objective: Vital: Filed Vitals:   10/23/13 0453 10/23/13 0454 10/23/13 0502 10/23/13 0743  BP:   109/71   Pulse:   95   Temp:   98.1 F (36.7 C)   TempSrc:   Oral   Resp: 17 17 18 17   Height:      Weight:      SpO2: 99% 99% 98% 99%   I/Os: I/O last 3 completed shifts: In: 5504.9 [P.O.:1440;  I.V.:3064.9; IV Piggyback:1000] Out: 3716 [Urine:1250; Drains:175]  Physical Exam:  General: Patient is in no apparent distress Lungs: Normal respiratory effort, chest expands symmetrically. GI: Abdomen is soft, incision is dressed and c/d/i, JP with serosanguinous fluid, foley draining straw colored urine Ext: lower extremities symmetric  Lab Results:  Recent Labs  10/21/13 0335 10/22/13 0515 10/23/13 0405  WBC 13.3* 11.5* 8.5  HGB 10.9* 9.4* 8.6*  HCT 34.4* 29.8* 27.7*    Recent Labs  10/21/13 0335 10/22/13 0515 10/23/13 0405  NA 138 134* 135*  K 4.2 3.6* 3.4*  CL 104 100 99  CO2 23 26 26   GLUCOSE 119* 124* 120*  BUN 3* 3* 3*  CREATININE 0.75 0.67 0.66  CALCIUM 7.9* 8.1* 7.9*   No results found for this basename: LABPT, INR,  in the last 72 hours No results found for this basename: LABURIN,  in the last 72 hours Results for orders placed during the hospital encounter of 10/17/13  URINE CULTURE     Status: None   Collection Time    10/17/13  9:35 PM      Result Value Ref Range Status   Specimen Description URINE, CLEAN CATCH   Final   Special Requests NONE   Final   Culture  Setup Time     Final   Value: 10/18/2013 00:32     Performed at [facility]     Final   Value: NO GROWTH     Performed at [facility]   Culture     Final   Value: NO GROWTH     Performed at [facility]   Report Status 10/18/2013 FINAL   Final  SURGICAL PCR SCREEN     Status: None   Collection Time    10/20/13 10:55 AM      Result Value Ref Range Status   MRSA, PCR NEGATIVE  NEGATIVE Final   Staphylococcus aureus NEGATIVE  NEGATIVE Final   Comment:            The Xpert SA Assay (FDA     approved for NASAL specimens     in patients over 72 years of age),     is one component of     a comprehensive surveillance  program.  Test performance has     been validated by [facility] for patients greater     than or equal to 80 year old.      It is not intended     to diagnose infection nor to     guide or monitor treatment.    Studies/Results: JP drain creatinine - 0.7  Assessment: 3 Days Post-Op S/p partial cystectomy   Path pending  Plan: Doing well.  JP can be removed at any time.  Foley will remain for two weeks.  We'll perform cystogram in office prior to removal.  Will continue to follow.  Ardis Hughs 10/23/2013, 8:00 AM

## 2013-10-23 NOTE — Progress Notes (Signed)
General Surgery [facility]  Patient seen and examined.  In chair, comfortable, ambulatory.  Urine in foley bag clear.  Encouraged ambulation.  Earnstine Regal, MD, [facility] Office: [phone]

## 2013-10-24 LAB — BASIC METABOLIC PANEL
BUN: <3 mg/dL — ABNORMAL LOW (ref 6–23)
CO2: 25 mEq/L (ref 19–32)
Calcium: 8 mg/dL — ABNORMAL LOW (ref 8.4–10.5)
Chloride: 103 mEq/L (ref 96–112)
Creatinine, Ser: 0.68 mg/dL (ref 0.50–1.10)
GFR calc Af Amer: >90 mL/min (ref >90)
GFR calc non Af Amer: >90 mL/min (ref >90)
Glucose, Bld: 96 mg/dL (ref 70–99)
Potassium: 3.8 mEq/L (ref 3.7–5.3)
Sodium: 137 mEq/L (ref 137–147)

## 2013-10-24 LAB — TYPE AND SCREEN
ABO/RH(D): O POS
Antibody Screen: NEGATIVE
Unit division: 0
Unit division: 0
Unit division: 0
Unit division: 0
Unit division: 0
Unit division: 0

## 2013-10-24 LAB — CBC
HCT: 26.4 % — ABNORMAL LOW (ref 36.0–46.0)
Hemoglobin: 8.3 g/dL — ABNORMAL LOW (ref 12.0–15.0)
MCH: 24.3 pg — ABNORMAL LOW (ref 26.0–34.0)
MCHC: 31.4 g/dL (ref 30.0–36.0)
MCV: 77.2 fL — ABNORMAL LOW (ref 78.0–100.0)
Platelets: 319 10*3/uL (ref 150–400)
RBC: 3.42 MIL/uL — ABNORMAL LOW (ref 3.87–5.11)
RDW: 22.7 % — ABNORMAL HIGH (ref 11.5–15.5)
WBC: 7.7 10*3/uL (ref 4.0–10.5)

## 2013-10-24 MED ORDER — IBUPROFEN 600 MG PO TABS
600.0000 mg | ORAL_TABLET | Freq: Four times a day (QID) | ORAL | Status: DC | PRN
  Administered 2013-10-24 – 2013-10-25 (×2): 600 mg via ORAL
  Filled 2013-10-24 (×2): qty 1

## 2013-10-24 MED ORDER — OXYCODONE HCL 5 MG/5ML PO SOLN: 5.0000 mg | ORAL | Status: DC | PRN

## 2013-10-24 MED ORDER — CHLORHEXIDINE GLUCONATE 0.12 % MT SOLN
15.0000 mL | Freq: Two times a day (BID) | OROMUCOSAL | Status: DC
  Administered 2013-10-25: 15 mL via OROMUCOSAL
  Filled 2013-10-24 (×4): qty 15

## 2013-10-24 MED ORDER — SIMETHICONE 80 MG PO CHEW
80.0000 mg | CHEWABLE_TABLET | Freq: Four times a day (QID) | ORAL | Status: DC | PRN
  Administered 2013-10-24: 80 mg via ORAL
  Filled 2013-10-24 (×2): qty 1

## 2013-10-24 MED ORDER — ACETAMINOPHEN 160 MG/5ML PO SOLN: 650.0000 mg | ORAL | Status: DC | PRN

## 2013-10-24 NOTE — Progress Notes (Signed)
General Surgery [facility]  Patient seen and examined.  Husband at bedside.  Small BM today - loose.  Ambulating.  Discussed path results.  Will need to see medical oncology and possibly radiation oncology.  Clear liquid diet.  Encouraged ambulation.  Earnstine Regal, MD, [facility] Office: [phone]

## 2013-10-24 NOTE — Progress Notes (Signed)
OT Cancellation Note  Patient Details Name: Amy Bentley MRN: [id number] DOB: 1970/11/11   Cancelled Treatment:    Reason Eval/Treat Not Completed: Other (comment). Pt reports that she is not concerned with BADLs, she has been getting up to bathroom with nursing and she is aware how to use reacher for LBD. She reports her husband will be with her at home. No further OT needs identified per pt, acute OT will sign off.  [name] [phone] 10/24/2013, 10:44 AM

## 2013-10-24 NOTE — Progress Notes (Signed)
4 Days Post-Op  Subjective: She walked some yesterday, but still no flatus.  She does not complain of the bloating she had yesterday.  I took down her dressings, and removed her On-Q, it was empty.  I told her the path was back and it was a colon cancer.  Objective: Vital signs in last 24 hours: Temp:  [98.4 F (36.9 C)-98.9 F (37.2 C)] 98.6 F (37 C) (04/28 0557) Pulse Rate:  [72] 72 (04/27 1429) Resp:  [13-18] 17 (04/28 0750) BP: (116-129)/(74-83) 127/74 mmHg (04/28 0557) SpO2:  [99 %-100 %] 100 % (04/28 0750) Last BM Date: 10/20/13 Nothing PO recorded yesterday, 720 PO recorded day prior. 360 from the drain yesterday NPO except ice chips Afebrile,  VSS, Labs OK , she is still anemic. JP drain is clear. Intake/Output from previous day: 04/27 0701 - 04/28 0700 In: 2251.7 [I.V.:1951.7; IV Piggyback:300] Out: 2360 [Urine:2000; Drains:360] Intake/Output this shift:    General appearance: alert, cooperative and no distress Resp: clear to auscultation bilaterally GI: soft few BS, no BM, Wicks are present, between staples.  No flatus so far.  Lab Results:   Recent Labs  10/23/13 0405 10/24/13 0412  WBC 8.5 7.7  HGB 8.6* 8.3*  HCT 27.7* 26.4*  PLT 321 319    BMET  Recent Labs  10/23/13 0405 10/24/13 0412  NA 135* 137  K 3.4* 3.8  CL 99 103  CO2 26 25  GLUCOSE 120* 96  BUN 3* <3*  CREATININE 0.66 0.68  CALCIUM 7.9* 8.0*   PT/INR No results found for this basename: LABPROT, INR,  in the last 72 hours  No results found for this basename: AST, ALT, ALKPHOS, BILITOT, PROT, ALBUMIN,  in the last 168 hours   Lipase  No results found for this basename: lipase     Studies/Results: No results found.  Medications: . alvimopan  12 mg Oral BID  . ciprofloxacin  400 mg Intravenous Q12H  . feeding supplement (RESOURCE BREEZE)  1 Container Oral TID WC  . HYDROmorphone PCA 0.3 mg/mL   Intravenous 6 times per day  . iron polysaccharides  150 mg Oral Daily  .  metronidazole  500 mg Intravenous 3 times per day  . mupirocin ointment   Nasal BID  . pantoprazole  20 mg Oral Daily  . pneumococcal 23 valent vaccine  0.5 mL Intramuscular Tomorrow-1000  . polyethylene glycol  17 g Oral BID     Colon, segmental resection for tumor, with bladder dome and sigmoid colon - COLORECTAL ADENOCARCINOMA EXTENDING INTO PERICOLONIC CONNECTIVE TISSUE AND INTO ATTACHED URINARY BLADDER WALL. - MARGINS NOT INVOLVED. - NINE BENIGN LYMPH NODES (0/9).pT4b, pN0 Assessment/Plan 1.  Pelvic mass with colovesical fistula;   Sigmoid colectomy, takedown of colovesical fistula, Stark Klein, MD, 10/20/2013.   2.  Partial cystectomy, Bladder was reconstructed in two layers. Significantly reduced bladder capacity. Ardis Hughs, MD, 10/20/13. Foley for 2 weeks. 3. UTI  4. Weight loss  5. BMI 47  6. Hx of tobacco use  7. Anemia/ heparin stopped with drop in H/H, on 4/27/  Transfused 10/20/13. 8.  SCD for DBT   Plan:  Restart some clears, I told her to double her walking today.  I will also try some PO pain meds to help her get off PCA.  I have told her the primary plan now is to get her thru the surgery.  She will then see oncology;  they will then decide on further work up and treatment.  LOS: 7 days    Earnstine Regal 10/24/2013

## 2013-10-24 NOTE — Progress Notes (Signed)
NUTRITION FOLLOW UP  Intervention:   - Diet advancement per MD - Resource Breeze TID - Will continue to monitor   Nutrition Dx:   Unintentional wt loss related to chronic disease/inadequate energy intake as evidenced by 30 lb wt loss in 2-3 months - not met, wt down 3 pounds   Goal:   Pt to meet >/= 90% of their estimated nutrition needs - not met   Monitor:   Weights, labs, diet advancement  Assessment:   Pt is 43 yo female with recurrent UTI, presenting to [facility] ED with main concern several months duration of progressively worsening lower quadrant abdominal pain, throbbing and intermittent,5/10 in severity when present, occasionally but not consistently radiating to the lower back area, right > left thigh area.   4/22: -Pt reported unintentional wt loss of 30 lbs in past 2-3 months  -Diet recall indicated pt would sometimes skip entire day of meals d/t abd pain  -Pt would normally eat yogurt/banana/granola bar for breakfast, sandwich or instant noodles for lunch, and bland foods (baked chicken/baked potato) for dinner  -Had significant abd pain after eating fried/greasy foods. Was also unable to tolerate cruciferous/gas causing vegetables such as kale, broccoli, and cabbage  -Currently on clear liquid diet. Consumed chicken broth/jello/juice. Denied abd pain or nausea post PO intake  -MD noted pt with probable bladder cancer, and diverticulitis of colon. May benefit from advancing to low fiber as diet advancement tolerated. Will monitor diet education needs  -Pt to undergo bedside cytoscopy on 4/23  -Was willing to trial Resource Breeze for additional nutrition while on clear liquid diet  -Will discuss additional supplement for weight maintenance (Ensure/Boost, etc) as tolerated  4/28: -S/p cystoscopy 4/23 and open sigmoid colon colovesical fistula repair with colectomy 4/24 -Advanced to clear liquid 4/25 however made NPO 4/27 due to nausea and bloating -Met pt while walking in the  hallway, denies any nausea or bloating today, eager to order some broth -Path back and pt with colon CA    Height: Ht Readings from Last 1 Encounters:  10/20/13 5' 1"  (1.549 m)    Weight Status:   Wt Readings from Last 1 Encounters:  10/20/13 247 lb 5.7 oz (112.2 kg)  Admit wt:        250 lb (113.9 kg) Net I/Os: +11.2L  Re-estimated needs:  Kcal: 1800-2000  Protein:100-110 gram  Fluid: >/=1700 ml/daily   Skin: Abdominal incision   Diet Order: NPO   Intake/Output Summary (Last 24 hours) at 10/24/13 1126 Last data filed at 10/24/13 1019  Gross per 24 hour  Intake   1900 ml  Output   2650 ml  Net   -750 ml    Last BM: 4/24   Labs:   Recent Labs Lab 10/22/13 0515 10/23/13 0405 10/24/13 0412  NA 134* 135* 137  K 3.6* 3.4* 3.8  CL 100 99 103  CO2 26 26 25   BUN 3* 3* <3*  CREATININE 0.67 0.66 0.68  CALCIUM 8.1* 7.9* 8.0*  GLUCOSE 124* 120* 96    CBG (last 3)  No results found for this basename: GLUCAP,  in the last 72 hours  Scheduled Meds: . alvimopan  12 mg Oral BID  . ciprofloxacin  400 mg Intravenous Q12H  . feeding supplement (RESOURCE BREEZE)  1 Container Oral TID WC  . HYDROmorphone PCA 0.3 mg/mL   Intravenous 6 times per day  . iron polysaccharides  150 mg Oral Daily  . metronidazole  500 mg Intravenous 3 times per  day  . mupirocin ointment   Nasal BID  . pantoprazole  20 mg Oral Daily  . pneumococcal 23 valent vaccine  0.5 mL Intramuscular Tomorrow-1000  . polyethylene glycol  17 g Oral BID    Continuous Infusions: . 0.9 % NaCl with KCl 20 mEq / L 100 mL (10/24/13 0118)    Mikey College MS, RD, [place] Pager [phone] After Hours Pager

## 2013-10-24 NOTE — Progress Notes (Signed)
[facility] PROGRESS NOTE  Amy Bentley LZJ:[id number] DOB: 1971-01-17 DOA: 10/17/2013 PCP: [name], MD  Assessment/Plan:  Principal Problem:   Probable bladder versus colon cancer versus complicated diverticulitis  CEA 125 high at 44, CEA high at 7.8, AFP WNL at 1.9. Urine culture negative, but colo-vesicle fistula is present.  Status post cystoscopy 10/19/13 which showed a 2-3 cm inflammatory mass with surrounding bullous edema and a colovesical fistula. There is also a 5.5 X7 centimeter mass above the dome of the bladder.  Status post open sigmoid colonectomy and colovesical fistula repair with partial cystectomy 10/20/13. Pathology reporting colorectal adenocarcinoma extending into pericolonic connective tissue and into attached urinary bladder wall. Oncology consulted  Active Problems:   Hypokalemia  Resolved after potassium runs  Severe protein calorie malnutrition / Weight loss / morbid obesity  Has had 10% weight loss over the past 2-3 months and oral intake of less than 75% for greater than one month.  Seen by dietitian. Continue supplements per dietitian recommendations.  Microcytic anemia / Rectal bleeding  Likely iron deficiency from chronic GI with GU blood loss and menstrual losses all contributory.  Received 2 units of packed red blood cells 10/20/13.  Monitor hemoglobin and continue iron supplements. Hemoglobin slowly trending down postoperatively.  Diverticulitis of colon (without mention of hemorrhage)  Continue Cipro/Flagyl.  DVT Prophylaxis  Continue SCDs.   Code Status: Full Family Communication: Discussed directly with patient Disposition Plan: Pending further recommendations   Consultants:  Oncology  Surgery: Dr. Harlow Asa  Urology: Dr. Louis Meckel  Procedures:  As listed above  Antibiotics:  Cipro and metronidazole  HPI/Subjective: No acute issues reported overnight. No new complaints  Objective: Filed Vitals:   10/24/13  1506  BP:   Pulse:   Temp:   Resp: 18    Intake/Output Summary (Last 24 hours) at 10/24/13 2011 Last data filed at 10/24/13 1800  Gross per 24 hour  Intake   2300 ml  Output   2420 ml  Net   -120 ml   Filed Weights   10/17/13 0627 10/17/13 1149 10/20/13 1900  Weight: 113.399 kg (250 lb) 113.399 kg (250 lb) 112.2 kg (247 lb 5.7 oz)    Exam:   General:  In no acute distress, alert and awake  Cardiovascular: Regular rate and rhythm, no murmurs rub  Respiratory: You're to auscultation bilaterally no wheezes  Abdomen: Soft, nontender, nondistended  Musculoskeletal: No cyanosis or clubbing  Data Reviewed: Basic Metabolic Panel:  Recent Labs Lab 10/18/13 0320 10/20/13 1526 10/21/13 0335 10/22/13 0515 10/23/13 0405 10/24/13 0412  NA 138 139 138 134* 135* 137  K 3.8 3.6* 4.2 3.6* 3.4* 3.8  CL 105  --  104 100 99 103  CO2 23  --  23 26 26 25   GLUCOSE 98 129* 119* 124* 120* 96  BUN 5*  --  3* 3* 3* <3*  CREATININE 0.70  --  0.75 0.67 0.66 0.68  CALCIUM 8.4  --  7.9* 8.1* 7.9* 8.0*   Liver Function Tests: No results found for this basename: AST, ALT, ALKPHOS, BILITOT, PROT, ALBUMIN,  in the last 168 hours No results found for this basename: LIPASE, AMYLASE,  in the last 168 hours No results found for this basename: AMMONIA,  in the last 168 hours CBC:  Recent Labs Lab 10/20/13 0328 10/20/13 1526 10/21/13 0335 10/22/13 0515 10/23/13 0405 10/24/13 0412  WBC 4.7  --  13.3* 11.5* 8.5 7.7  HGB 7.7* 10.5* 10.9* 9.4* 8.6* 8.3*  HCT 25.1*  31.0* 34.4* 29.8* 27.7* 26.4*  MCV 68.8*  --  74.5* 75.4* 76.3* 77.2*  PLT 362  --  357 340 321 319   Cardiac Enzymes: No results found for this basename: CKTOTAL, CKMB, CKMBINDEX, TROPONINI,  in the last 168 hours BNP (last 3 results) No results found for this basename: PROBNP,  in the last 8760 hours CBG: No results found for this basename: GLUCAP,  in the last 168 hours  Recent Results (from the past 240 hour(s))  URINE  CULTURE     Status: None   Collection Time    10/17/13  9:35 PM      Result Value Ref Range Status   Specimen Description URINE, CLEAN CATCH   Final   Special Requests NONE   Final   Culture  Setup Time     Final   Value: 10/18/2013 00:32     Performed at [facility]     Final   Value: NO GROWTH     Performed at [facility]   Culture     Final   Value: NO GROWTH     Performed at [facility]   Report Status 10/18/2013 FINAL   Final  SURGICAL PCR SCREEN     Status: None   Collection Time    10/20/13 10:55 AM      Result Value Ref Range Status   MRSA, PCR NEGATIVE  NEGATIVE Final   Staphylococcus aureus NEGATIVE  NEGATIVE Final   Comment:            The Xpert SA Assay (FDA     approved for NASAL specimens     in patients over 17 years of age),     is one component of     a comprehensive surveillance     program.  Test performance has     been validated by [employer] for patients greater     than or equal to 18 year old.     It is not intended     to diagnose infection nor to     guide or monitor treatment.     Studies: No results found.  Scheduled Meds: . alvimopan  12 mg Oral BID  . ciprofloxacin  400 mg Intravenous Q12H  . feeding supplement (RESOURCE BREEZE)  1 Container Oral TID WC  . HYDROmorphone PCA 0.3 mg/mL   Intravenous 6 times per day  . iron polysaccharides  150 mg Oral Daily  . metronidazole  500 mg Intravenous 3 times per day  . mupirocin ointment   Nasal BID  . pantoprazole  20 mg Oral Daily  . pneumococcal 23 valent vaccine  0.5 mL Intramuscular Tomorrow-1000  . polyethylene glycol  17 g Oral BID   Continuous Infusions: . 0.9 % NaCl with KCl 20 mEq / L 100 mL/hr at 10/24/13 1315    Time spent: > 35 minutes    [facility] Pager [phone]. If 7PM-7AM, please contact night-coverage at [email or web address], password [facility] 10/24/2013, 8:11 PM  LOS: 7 days

## 2013-10-25 LAB — BASIC METABOLIC PANEL
BUN: <3 mg/dL — ABNORMAL LOW (ref 6–23)
CO2: 26 mEq/L (ref 19–32)
Calcium: 8.2 mg/dL — ABNORMAL LOW (ref 8.4–10.5)
Chloride: 103 mEq/L (ref 96–112)
Creatinine, Ser: 0.64 mg/dL (ref 0.50–1.10)
GFR calc Af Amer: >90 mL/min (ref >90)
GFR calc non Af Amer: >90 mL/min (ref >90)
Glucose, Bld: 100 mg/dL — ABNORMAL HIGH (ref 70–99)
Potassium: 3.5 mEq/L — ABNORMAL LOW (ref 3.7–5.3)
Sodium: 138 mEq/L (ref 137–147)

## 2013-10-25 LAB — CBC
HCT: 24.1 % — ABNORMAL LOW (ref 36.0–46.0)
Hemoglobin: 7.6 g/dL — ABNORMAL LOW (ref 12.0–15.0)
MCH: 24.1 pg — ABNORMAL LOW (ref 26.0–34.0)
MCHC: 31.5 g/dL (ref 30.0–36.0)
MCV: 76.3 fL — ABNORMAL LOW (ref 78.0–100.0)
Platelets: 303 10*3/uL (ref 150–400)
RBC: 3.16 MIL/uL — ABNORMAL LOW (ref 3.87–5.11)
RDW: 23.1 % — ABNORMAL HIGH (ref 11.5–15.5)
WBC: 7.7 10*3/uL (ref 4.0–10.5)

## 2013-10-25 NOTE — Progress Notes (Signed)
Patient ID: Amy Bentley, female   DOB: 1970/07/16, 43 y.o.   MRN: [id number] 5 Days Post-Op  Subjective: Pt doing well today.  Had 4 BMs so far.  No nausea.  Tolerating clear liquids well.  Pain controlled  Objective: Vital signs in last 24 hours: Temp:  [98.3 F (36.8 C)-98.6 F (37 C)] 98.6 F (37 C) (04/29 0609) Pulse Rate:  [74-103] 74 (04/29 0609) Resp:  [15-21] 16 (04/29 0741) BP: (103-129)/(69-83) 103/69 mmHg (04/29 0609) SpO2:  [98 %-100 %] 100 % (04/29 0741) Last BM Date: 10/20/13  Intake/Output from previous day: 04/28 0701 - 04/29 0700 In: 2100 [P.O.:600; I.V.:1200; IV Piggyback:300] Out: 4854 [Urine:2300; Drains:250; Stool:1] Intake/Output this shift:    PE: Abd: soft, incision looks good.  Wicks removed today.  JP with serosang output.  +BS, ND, obese  Lab Results:   Recent Labs  10/24/13 0412 10/25/13 0402  WBC 7.7 7.7  HGB 8.3* 7.6*  HCT 26.4* 24.1*  PLT 319 303   BMET  Recent Labs  10/24/13 0412 10/25/13 0402  NA 137 138  K 3.8 3.5*  CL 103 103  CO2 25 26  GLUCOSE 96 100*  BUN <3* <3*  CREATININE 0.68 0.64  CALCIUM 8.0* 8.2*   PT/INR No results found for this basename: LABPROT, INR,  in the last 72 hours CMP     Component Value Date/Time   NA 138 10/25/2013 0402   K 3.5* 10/25/2013 0402   CL 103 10/25/2013 0402   CO2 26 10/25/2013 0402   GLUCOSE 100* 10/25/2013 0402   BUN <3* 10/25/2013 0402   CREATININE 0.64 10/25/2013 0402   CALCIUM 8.2* 10/25/2013 0402   PROT 7.4 10/17/2013 0650   ALBUMIN 3.3* 10/17/2013 0650   AST 14 10/17/2013 0650   ALT 9 10/17/2013 0650   ALKPHOS 94 10/17/2013 0650   BILITOT 0.3 10/17/2013 0650   GFRNONAA >90 10/25/2013 0402   GFRAA >90 10/25/2013 0402   Lipase  No results found for this basename: lipase       Studies/Results: No results found.  Anti-infectives: Anti-infectives   Start     Dose/Rate Route Frequency Ordered Stop   10/20/13 1245  ertapenem (INVANZ) 1 g in sodium chloride 0.9 % 50 mL IVPB      1 g 100 mL/hr over 30 Minutes Intravenous  Once 10/20/13 1238 10/20/13 1241   10/17/13 2030  metroNIDAZOLE (FLAGYL) IVPB 500 mg     500 mg 100 mL/hr over 60 Minutes Intravenous 3 times per day 10/17/13 1847     10/17/13 2000  ciprofloxacin (CIPRO) IVPB 400 mg     400 mg 200 mL/hr over 60 Minutes Intravenous Every 12 hours 10/17/13 1847     10/17/13 0715  cefTRIAXone (ROCEPHIN) 1 g in dextrose 5 % 50 mL IVPB     1 g 100 mL/hr over 30 Minutes Intravenous  Once 10/17/13 0703 10/17/13 0808       Assessment/Plan  1. POD 4 S/p ex lap, OPEN SIGMOID COLONECTOMY COLOVESICAL FISTULA REPAIR, CYSTECTOMY PARTIAL for colovesical fistula  2. Anemia, likely related to surgery  Plan: 1. Path d/w patient yesterday 2. Advance to full liquids today 3. DC PCA and convert to oral pain meds 4. Cont to hold Lovenox given anemia 5. Cont to mobilize 6. Will need to see oncology as an outpatient 7. abx discontinued as she has completed a 8 day course.    LOS: 8 days    Henreitta Cea 10/25/2013, 9:48 AM Pager:  [phone]  

## 2013-10-25 NOTE — Progress Notes (Signed)
General Surgery [facility]  Agree with assessment by Saverio Danker.  Advance to full liquids.  Earnstine Regal, MD, [facility] Office: [phone]

## 2013-10-25 NOTE — Progress Notes (Signed)
[facility] PROGRESS NOTE  Amy Bentley TGG:[id number] DOB: Jan 24, 1971 DOA: 10/17/2013 PCP: [name], MD  Assessment/Plan:  Principal Problem:   Probable bladder versus colon cancer versus complicated diverticulitis  CEA 125 high at 44, CEA high at 7.8, AFP WNL at 1.9. Urine culture negative, but colo-vesicle fistula is present.  Status post cystoscopy 10/19/13 which showed a 2-3 cm inflammatory mass with surrounding bullous edema and a colovesical fistula. There is also a 5.5 X7 centimeter mass above the dome of the bladder.  Status post open sigmoid colonectomy and colovesical fistula repair with partial cystectomy 10/20/13. Pathology reporting colorectal adenocarcinoma extending into pericolonic connective tissue and into attached urinary bladder wall. Oncology consulted and plan is for patient to follow up with oncology as outpatient.  Active Problems:   Hypokalemia  Will reassess next am.  Severe protein calorie malnutrition / Weight loss / morbid obesity  Has had 10% weight loss over the past 2-3 months and oral intake of less than 75% for greater than one month.  Seen by dietitian. Continue supplements per dietitian recommendations.  Microcytic anemia / Rectal bleeding  Likely iron deficiency from chronic GI with GU blood loss and menstrual losses all contributory.  Received 2 units of packed red blood cells 10/20/13.  Monitor hemoglobin and continue iron supplements. Hemoglobin slowly trending down postoperatively.   DVT Prophylaxis  Continue SCDs.   Code Status: Full Family Communication: Discussed directly with patient Disposition Plan: Pending further recommendations   Consultants:  Oncology  Surgery: Dr. Harlow Asa  Urology: Dr. Louis Meckel  Procedures:  As listed above  Antibiotics:  None  HPI/Subjective: No acute issues reported overnight. No new complaints  Objective: Filed Vitals:   10/25/13 1401  BP: 122/76  Pulse: 97  Temp:  98.6 F (37 C)  Resp: 18    Intake/Output Summary (Last 24 hours) at 10/25/13 1806 Last data filed at 10/25/13 1759  Gross per 24 hour  Intake    840 ml  Output   2996 ml  Net  -2156 ml   Filed Weights   10/17/13 0627 10/17/13 1149 10/20/13 1900  Weight: 113.399 kg (250 lb) 113.399 kg (250 lb) 112.2 kg (247 lb 5.7 oz)    Exam:   General:  In no acute distress, alert and awake  Cardiovascular: Regular rate and rhythm, no murmurs rub  Respiratory: clear to auscultation bilaterally no wheezes  Abdomen: Soft, nontender, nondistended  Musculoskeletal: No cyanosis or clubbing  Data Reviewed: Basic Metabolic Panel:  Recent Labs Lab 10/21/13 0335 10/22/13 0515 10/23/13 0405 10/24/13 0412 10/25/13 0402  NA 138 134* 135* 137 138  K 4.2 3.6* 3.4* 3.8 3.5*  CL 104 100 99 103 103  CO2 23 26 26 25 26   GLUCOSE 119* 124* 120* 96 100*  BUN 3* 3* 3* <3* <3*  CREATININE 0.75 0.67 0.66 0.68 0.64  CALCIUM 7.9* 8.1* 7.9* 8.0* 8.2*   Liver Function Tests: No results found for this basename: AST, ALT, ALKPHOS, BILITOT, PROT, ALBUMIN,  in the last 168 hours No results found for this basename: LIPASE, AMYLASE,  in the last 168 hours No results found for this basename: AMMONIA,  in the last 168 hours CBC:  Recent Labs Lab 10/21/13 0335 10/22/13 0515 10/23/13 0405 10/24/13 0412 10/25/13 0402  WBC 13.3* 11.5* 8.5 7.7 7.7  HGB 10.9* 9.4* 8.6* 8.3* 7.6*  HCT 34.4* 29.8* 27.7* 26.4* 24.1*  MCV 74.5* 75.4* 76.3* 77.2* 76.3*  PLT 357 340 321 319 303   Cardiac Enzymes: No  results found for this basename: CKTOTAL, CKMB, CKMBINDEX, TROPONINI,  in the last 168 hours BNP (last 3 results) No results found for this basename: PROBNP,  in the last 8760 hours CBG: No results found for this basename: GLUCAP,  in the last 168 hours  Recent Results (from the past 240 hour(s))  URINE CULTURE     Status: None   Collection Time    10/17/13  9:35 PM      Result Value Ref Range Status    Specimen Description URINE, CLEAN CATCH   Final   Special Requests NONE   Final   Culture  Setup Time     Final   Value: 10/18/2013 00:32     Performed at [facility]     Final   Value: NO GROWTH     Performed at [facility]   Culture     Final   Value: NO GROWTH     Performed at [facility]   Report Status 10/18/2013 FINAL   Final  SURGICAL PCR SCREEN     Status: None   Collection Time    10/20/13 10:55 AM      Result Value Ref Range Status   MRSA, PCR NEGATIVE  NEGATIVE Final   Staphylococcus aureus NEGATIVE  NEGATIVE Final   Comment:            The Xpert SA Assay (FDA     approved for NASAL specimens     in patients over 51 years of age),     is one component of     a comprehensive surveillance     program.  Test performance has     been validated by [employer] for patients greater     than or equal to 76 year old.     It is not intended     to diagnose infection nor to     guide or monitor treatment.     Studies: No results found.  Scheduled Meds: . feeding supplement (RESOURCE BREEZE)  1 Container Oral TID WC  . iron polysaccharides  150 mg Oral Daily  . mupirocin ointment   Nasal BID  . pantoprazole  20 mg Oral Daily  . pneumococcal 23 valent vaccine  0.5 mL Intramuscular Tomorrow-1000  . polyethylene glycol  17 g Oral BID   Continuous Infusions: . 0.9 % NaCl with KCl 20 mEq / L 100 mL/hr (10/25/13 1523)    Time spent: > 35 minutes    [facility] Pager [phone]. If 7PM-7AM, please contact night-coverage at [email or web address], password [facility] 10/25/2013, 6:06 PM  LOS: 8 days

## 2013-10-25 NOTE — Progress Notes (Signed)
PT Cancellation Note  ___Treatment cancelled today due to medical issues with patient which prohibited therapy  ___ Treatment cancelled today due to patient receiving procedure or test   ___ Treatment cancelled today due to patient's refusal to participate   _X_ Treatment cancelled today due to pt amb self in hallway safely holding to IV pole  Rica Koyanagi  PTA [facility]  Acute  Rehab Pager      [phone]

## 2013-10-26 DIAGNOSIS — E876 Hypokalemia: Secondary | ICD-10-CM

## 2013-10-26 LAB — CBC
HCT: 25.2 % — ABNORMAL LOW (ref 36.0–46.0)
Hemoglobin: 8 g/dL — ABNORMAL LOW (ref 12.0–15.0)
MCH: 24.4 pg — ABNORMAL LOW (ref 26.0–34.0)
MCHC: 31.7 g/dL (ref 30.0–36.0)
MCV: 76.8 fL — ABNORMAL LOW (ref 78.0–100.0)
Platelets: 334 10*3/uL (ref 150–400)
RBC: 3.28 MIL/uL — ABNORMAL LOW (ref 3.87–5.11)
RDW: 23.1 % — ABNORMAL HIGH (ref 11.5–15.5)
WBC: 7.5 10*3/uL (ref 4.0–10.5)

## 2013-10-26 LAB — BASIC METABOLIC PANEL
BUN: <3 mg/dL — ABNORMAL LOW (ref 6–23)
CO2: 25 mEq/L (ref 19–32)
Calcium: 8.2 mg/dL — ABNORMAL LOW (ref 8.4–10.5)
Chloride: 105 mEq/L (ref 96–112)
Creatinine, Ser: 0.61 mg/dL (ref 0.50–1.10)
GFR calc Af Amer: >90 mL/min (ref >90)
GFR calc non Af Amer: >90 mL/min (ref >90)
Glucose, Bld: 112 mg/dL — ABNORMAL HIGH (ref 70–99)
Potassium: 3.6 mEq/L — ABNORMAL LOW (ref 3.7–5.3)
Sodium: 140 mEq/L (ref 137–147)

## 2013-10-26 LAB — MAGNESIUM: Magnesium: 1.8 mg/dL (ref 1.5–2.5)

## 2013-10-26 MED ORDER — SACCHAROMYCES BOULARDII 250 MG PO CAPS
250.0000 mg | ORAL_CAPSULE | Freq: Two times a day (BID) | ORAL | Status: DC
  Administered 2013-10-26 – 2013-10-28 (×5): 250 mg via ORAL
  Filled 2013-10-26 (×6): qty 1

## 2013-10-26 MED ORDER — HEPARIN SODIUM (PORCINE) 5000 UNIT/ML IJ SOLN
5000.0000 [IU] | Freq: Three times a day (TID) | INTRAMUSCULAR | Status: DC
  Administered 2013-10-26 – 2013-10-28 (×7): 5000 [IU] via SUBCUTANEOUS
  Filled 2013-10-26 (×9): qty 1

## 2013-10-26 MED ORDER — TAB-A-VITE/IRON PO TABS
1.0000 | ORAL_TABLET | Freq: Every day | ORAL | Status: DC
  Administered 2013-10-26 – 2013-10-28 (×3): 1 via ORAL
  Filled 2013-10-26 (×3): qty 1

## 2013-10-26 MED ORDER — POTASSIUM CHLORIDE CRYS ER 20 MEQ PO TBCR
20.0000 meq | EXTENDED_RELEASE_TABLET | Freq: Two times a day (BID) | ORAL | Status: DC
  Administered 2013-10-26 – 2013-10-28 (×4): 20 meq via ORAL
  Filled 2013-10-26 (×6): qty 1

## 2013-10-26 NOTE — Progress Notes (Signed)
6 Days Post-Op  Subjective: She is feeling better.  tolerating full liquids, and having BM.  Drain is clear.  Objective: Vital signs in last 24 hours: Temp:  [98.1 F (36.7 C)-98.6 F (37 C)] 98.1 F (36.7 C) (04/30 0551) Pulse Rate:  [97-105] 105 (04/30 0551) Resp:  [16-18] 16 (04/30 0551) BP: (122-128)/(73-82) 122/82 mmHg (04/30 0551) SpO2:  [96 %-98 %] 96 % (04/30 0551) Last BM Date: 10/25/13 240 po RECORDED  Diet:  Full liquids No stool recorded. Afebrile, VSS,  K+ 3.6 CBC OK Intake/Output from previous day: 04/29 0701 - 04/30 0700 In: 6270 [P.O.:240; I.V.:3200] Out: 3500 [Urine:3050; Drains:125] Intake/Output this shift: Total I/O In: -  Out: 825 [Urine:800; Drains:25]  General appearance: alert, cooperative and no distress Resp: clear to auscultation bilaterally GI: sore drain is clear, incision is ok, + BS and BM.  Foley in.  Lab Results:   Recent Labs  10/25/13 0402 10/26/13 0355  WBC 7.7 7.5  HGB 7.6* 8.0*  HCT 24.1* 25.2*  PLT 303 334    BMET  Recent Labs  10/25/13 0402 10/26/13 0355  NA 138 140  K 3.5* 3.6*  CL 103 105  CO2 26 25  GLUCOSE 100* 112*  BUN <3* <3*  CREATININE 0.64 0.61  CALCIUM 8.2* 8.2*   PT/INR No results found for this basename: LABPROT, INR,  in the last 72 hours  No results found for this basename: AST, ALT, ALKPHOS, BILITOT, PROT, ALBUMIN,  in the last 168 hours   Lipase  No results found for this basename: lipase     Studies/Results: No results found.  Medications: . feeding supplement (RESOURCE BREEZE)  1 Container Oral TID WC  . iron polysaccharides  150 mg Oral Daily  . mupirocin ointment   Nasal BID  . pantoprazole  20 mg Oral Daily  . pneumococcal 23 valent vaccine  0.5 mL Intramuscular Tomorrow-1000  . polyethylene glycol  17 g Oral BID    Assessment/Plan . Pelvic mass with colovesical fistula; Sigmoid colectomy, takedown of colovesical fistula, Stark Klein, MD, 10/20/2013.  2. Partial  cystectomy, Bladder was reconstructed in two layers. Significantly reduced bladder capacity. Ardis Hughs, MD, 10/20/13. Foley for 2 weeks.  3. UTI  4. Weight loss  5. BMI 47  6. Hx of tobacco use  7. Anemia/ heparin stopped with drop in H/H, on 4/27/ Transfused 10/20/13.  8. SCD for DBT   Plan: soft diet, d/c IV, leg bag for foley, continue to mobilize.  I will see when drain can come out and when she can go home.  She would need to come back next week to get her staples out.  Replace K+, and add MVI with Fe.  LOS: 9 days    Amy Bentley 10/26/2013

## 2013-10-26 NOTE — Progress Notes (Signed)
General Surgery [facility]  Patient seen and examined.  Continues to improve.  Tolerating regular diet, having BM's.  Will remove drain in AM.  Foley to remain for 2 weeks per urology.  Likely home soon.  Earnstine Regal, MD, [facility] Office: [phone]

## 2013-10-26 NOTE — Progress Notes (Signed)
Physical Therapy Discharge Patient Details Name: Amy Bentley MRN: [id number] DOB: Nov 19, 1970 Today's Date: 10/26/2013 Time:  -     Patient discharged from PT services secondary to goals met and no further PT needs identified.pt reports up ad lib.  Please see latest therapy progress note for current level of functioning and progress toward goals.    Progress and discharge plan discussed with patient and/or caregiver: yes   GP     Marcelino Freestone PT [phone]  10/26/2013, 2:12 PM

## 2013-10-26 NOTE — Progress Notes (Signed)
Urology Inpatient Progress Report S/p sigmoid resection/partial cystectomy for bulky pelvic mass and colovesical fistula.  Intv/Subj: No acute events overnight Tolerating full liquids Foley draining clearly urine   History reviewed. No pertinent past medical history. Current Facility-Administered Medications  Medication Dose Route Frequency Provider Last Rate Last Dose  . 0.9 % NaCl with KCl 20 mEq/ L  infusion   Intravenous Continuous Venetia Maxon Rama, MD 100 mL/hr at 10/26/13 0200    . alum & mag hydroxide-simeth (MAALOX/MYLANTA) 200-200-20 MG/5ML suspension 30 mL  30 mL Oral PRN Venetia Maxon Rama, MD   30 mL at 10/18/13 1229  . alum & mag hydroxide-simeth (MAALOX/MYLANTA) 200-200-20 MG/5ML suspension 30 mL  30 mL Oral Q6H PRN Stark Klein, MD      . diphenhydrAMINE (BENADRYL) injection 12.5 mg  12.5 mg Intravenous Q6H PRN Stark Klein, MD       Or  . diphenhydrAMINE (BENADRYL) 12.5 MG/5ML elixir 12.5 mg  12.5 mg Oral Q6H PRN Stark Klein, MD      . diphenhydrAMINE (BENADRYL) 12.5 MG/5ML elixir 12.5 mg  12.5 mg Oral Q6H PRN Stark Klein, MD       Or  . diphenhydrAMINE (BENADRYL) injection 12.5 mg  12.5 mg Intravenous Q6H PRN Stark Klein, MD      . feeding supplement (RESOURCE BREEZE) (RESOURCE BREEZE) liquid 1 Container  1 Container Oral TID WC Hazle Coca, RD   1 Container at 10/25/13 1200  . ibuprofen (ADVIL,MOTRIN) tablet 600 mg  600 mg Oral Q6H PRN Earnstine Regal, PA-C   600 mg at 10/25/13 0102  . iron polysaccharides (NIFEREX) capsule 150 mg  150 mg Oral Daily Venetia Maxon Rama, MD   150 mg at 10/25/13 1024  . mupirocin ointment (BACTROBAN) 2 %   Nasal BID Venetia Maxon Rama, MD      . naloxone [facility]) injection 0.4 mg  0.4 mg Intravenous PRN Stark Klein, MD       And  . sodium chloride 0.9 % injection 9 mL  9 mL Intravenous PRN Stark Klein, MD      . ondansetron (ZOFRAN) tablet 4 mg  4 mg Oral Q6H PRN Theodis Blaze, MD       Or  . ondansetron [facility]) injection 4 mg  4  mg Intravenous Q6H PRN Theodis Blaze, MD      . ondansetron [facility]) injection 4 mg  4 mg Intravenous Q6H PRN Stark Klein, MD   4 mg at 10/23/13 0739  . oxyCODONE-acetaminophen (PERCOCET/ROXICET) 5-325 MG per tablet 2 tablet  2 tablet Oral Q4H PRN Venetia Maxon Rama, MD   2 tablet at 10/26/13 0242  . pantoprazole (PROTONIX) EC tablet 20 mg  20 mg Oral Daily Venetia Maxon Rama, MD   20 mg at 10/25/13 1024  . pneumococcal 23 valent vaccine (PNU-IMMUNE) injection 0.5 mL  0.5 mL Intramuscular Tomorrow-1000 Theodis Blaze, MD      . polyethylene glycol [facility] / GLYCOLAX) packet 17 g  17 g Oral BID Earnstine Regal, PA-C   17 g at 10/25/13 2153  . promethazine (PHENERGAN) injection 6.25 mg  6.25 mg Intravenous Q6H PRN Stark Klein, MD      . simethicone (MYLICON) chewable tablet 80 mg  80 mg Oral QID PRN Earnstine Regal, PA-C   80 mg at 10/24/13 1320     Objective: Vital: Filed Vitals:   10/25/13 0741 10/25/13 1401 10/25/13 2132 10/26/13 0551  BP:  122/76 128/73 122/82  Pulse:  97 103 105  Temp:  98.6 F (37 C) 98.3 F (36.8 C) 98.1 F (36.7 C)  TempSrc:  Oral Oral Oral  Resp: 16 18 16 16   Height:      Weight:      SpO2: 100% 98% 96% 96%   I/Os: I/O last 3 completed shifts: In: 3151 [P.O.:840; I.V.:3200] Out: 4616 [Urine:4350; Drains:265; Stool:1]  Physical Exam:  General: Patient is in no apparent distress Lungs: Normal respiratory effort, chest expands symmetrically. GI: Abdomen is soft, incision is dressed and c/d/i, JP with serosanguinous fluid, foley draining straw colored urine Ext: lower extremities symmetric  Lab Results:  Recent Labs  10/24/13 0412 10/25/13 0402 10/26/13 0355  WBC 7.7 7.7 7.5  HGB 8.3* 7.6* 8.0*  HCT 26.4* 24.1* 25.2*    Recent Labs  10/24/13 0412 10/25/13 0402 10/26/13 0355  NA 137 138 140  K 3.8 3.5* 3.6*  CL 103 103 105  CO2 25 26 25   GLUCOSE 96 100* 112*  BUN <3* <3* <3*  CREATININE 0.68 0.64 0.61  CALCIUM 8.0* 8.2* 8.2*   No  results found for this basename: LABPT, INR,  in the last 72 hours No results found for this basename: LABURIN,  in the last 72 hours Results for orders placed during the hospital encounter of 10/17/13  URINE CULTURE     Status: None   Collection Time    10/17/13  9:35 PM      Result Value Ref Range Status   Specimen Description URINE, CLEAN CATCH   Final   Special Requests NONE   Final   Culture  Setup Time     Final   Value: 10/18/2013 00:32     Performed at [facility]     Final   Value: NO GROWTH     Performed at [facility]   Culture     Final   Value: NO GROWTH     Performed at [facility]   Report Status 10/18/2013 FINAL   Final  SURGICAL PCR SCREEN     Status: None   Collection Time    10/20/13 10:55 AM      Result Value Ref Range Status   MRSA, PCR NEGATIVE  NEGATIVE Final   Staphylococcus aureus NEGATIVE  NEGATIVE Final   Comment:            The Xpert SA Assay (FDA     approved for NASAL specimens     in patients over 52 years of age),     is one component of     a comprehensive surveillance     program.  Test performance has     been validated by [employer] for patients greater     than or equal to 33 year old.     It is not intended     to diagnose infection nor to     guide or monitor treatment.    Studies/Results: JP drain creatinine - 0.7  Assessment: 6 Days Post-Op S/p partial cystectomy Sigmoid adenocarcinoma invading into the bladder  Plan: We'll plan followup in the office in 10-14 days with a cystogram. Foley catheter will remain until then.  Will continue to follow.  Ardis Hughs 10/26/2013, 8:13 AM

## 2013-10-26 NOTE — Progress Notes (Signed)
[facility] PROGRESS NOTE  Amy Bentley ZOX:[id number] DOB: 02-Jan-1971 DOA: 10/17/2013 PCP: [name], MD  Assessment/Plan:  Principal Problem:   Probable bladder versus colon cancer versus complicated diverticulitis  CEA 125 high at 44, CEA high at 7.8, AFP WNL at 1.9. Urine culture negative, but colo-vesicle fistula is present.  Status post cystoscopy 10/19/13 which showed a 2-3 cm inflammatory mass with surrounding bullous edema and a colovesical fistula. There is also a 5.5 X7 centimeter mass above the dome of the bladder.  Status post open sigmoid colonectomy and colovesical fistula repair with partial cystectomy 10/20/13. Pathology reporting colorectal adenocarcinoma extending into pericolonic connective tissue and into attached urinary bladder wall. Oncology consulted and plan is for patient to follow up with oncology as outpatient.  Active Problems:   Hypokalemia  Last value higher than last check.  Should improve with improvement in oral intake.   Severe protein calorie malnutrition / Weight loss / morbid obesity  Has had 10% weight loss over the past 2-3 months and oral intake of less than 75% for greater than one month.  Seen by dietitian. Continue supplements per dietitian recommendations.  Microcytic anemia / Rectal bleeding  Likely iron deficiency from chronic GI with GU blood loss and menstrual losses all contributory.  Received 2 units of packed red blood cells 10/20/13.  Monitor hemoglobin and continue iron supplements. Hemoglobin steady and improved from last check to 8   DVT Prophylaxis  Continue SCDs.   Code Status: Full Family Communication: Discussed directly with patient Disposition Plan: Once cleared for discharge from surgeon's standpoint.   Consultants:  Oncology  Surgery: Dr. Harlow Asa  Urology: Dr. Louis Meckel  Procedures:  As listed above  Antibiotics:  None  HPI/Subjective: No acute issues reported overnight. No new  complaints  Objective: Filed Vitals:   10/26/13 1400  BP: 128/83  Pulse: 107  Temp: 98.2 F (36.8 C)  Resp: 18    Intake/Output Summary (Last 24 hours) at 10/26/13 1547 Last data filed at 10/26/13 1400  Gross per 24 hour  Intake   3200 ml  Output   3687 ml  Net   -487 ml   Filed Weights   10/17/13 0627 10/17/13 1149 10/20/13 1900  Weight: 113.399 kg (250 lb) 113.399 kg (250 lb) 112.2 kg (247 lb 5.7 oz)    Exam:   General:  In no acute distress, alert and awake  Cardiovascular: Regular rate and rhythm, no murmurs rub  Respiratory: clear to auscultation bilaterally no wheezes  Abdomen: Soft, nontender, nondistended  Musculoskeletal: No cyanosis or clubbing  Data Reviewed: Basic Metabolic Panel:  Recent Labs Lab 10/22/13 0515 10/23/13 0405 10/24/13 0412 10/25/13 0402 10/26/13 0355  NA 134* 135* 137 138 140  K 3.6* 3.4* 3.8 3.5* 3.6*  CL 100 99 103 103 105  CO2 26 26 25 26 25   GLUCOSE 124* 120* 96 100* 112*  BUN 3* 3* <3* <3* <3*  CREATININE 0.67 0.66 0.68 0.64 0.61  CALCIUM 8.1* 7.9* 8.0* 8.2* 8.2*  MG  --   --   --   --  1.8   Liver Function Tests: No results found for this basename: AST, ALT, ALKPHOS, BILITOT, PROT, ALBUMIN,  in the last 168 hours No results found for this basename: LIPASE, AMYLASE,  in the last 168 hours No results found for this basename: AMMONIA,  in the last 168 hours CBC:  Recent Labs Lab 10/22/13 0515 10/23/13 0405 10/24/13 0412 10/25/13 0402 10/26/13 0355  WBC 11.5* 8.5 7.7 7.7  7.5  HGB 9.4* 8.6* 8.3* 7.6* 8.0*  HCT 29.8* 27.7* 26.4* 24.1* 25.2*  MCV 75.4* 76.3* 77.2* 76.3* 76.8*  PLT 340 321 319 303 334   Cardiac Enzymes: No results found for this basename: CKTOTAL, CKMB, CKMBINDEX, TROPONINI,  in the last 168 hours BNP (last 3 results) No results found for this basename: PROBNP,  in the last 8760 hours CBG: No results found for this basename: GLUCAP,  in the last 168 hours  Recent Results (from the past 240  hour(s))  URINE CULTURE     Status: None   Collection Time    10/17/13  9:35 PM      Result Value Ref Range Status   Specimen Description URINE, CLEAN CATCH   Final   Special Requests NONE   Final   Culture  Setup Time     Final   Value: 10/18/2013 00:32     Performed at [facility]     Final   Value: NO GROWTH     Performed at [facility]   Culture     Final   Value: NO GROWTH     Performed at [facility]   Report Status 10/18/2013 FINAL   Final  SURGICAL PCR SCREEN     Status: None   Collection Time    10/20/13 10:55 AM      Result Value Ref Range Status   MRSA, PCR NEGATIVE  NEGATIVE Final   Staphylococcus aureus NEGATIVE  NEGATIVE Final   Comment:            The Xpert SA Assay (FDA     approved for NASAL specimens     in patients over 34 years of age),     is one component of     a comprehensive surveillance     program.  Test performance has     been validated by [employer] for patients greater     than or equal to 8 year old.     It is not intended     to diagnose infection nor to     guide or monitor treatment.     Studies: No results found.  Scheduled Meds: . feeding supplement (RESOURCE BREEZE)  1 Container Oral TID WC  . heparin subcutaneous  5,000 Units Subcutaneous 3 times per day  . iron polysaccharides  150 mg Oral Daily  . multivitamins with iron  1 tablet Oral Daily  . mupirocin ointment   Nasal BID  . pantoprazole  20 mg Oral Daily  . pneumococcal 23 valent vaccine  0.5 mL Intramuscular Tomorrow-1000  . polyethylene glycol  17 g Oral BID  . potassium chloride  20 mEq Oral BID WC  . saccharomyces boulardii  250 mg Oral BID   Continuous Infusions:    Time spent: > 35 minutes    [facility] Pager [phone]. If 7PM-7AM, please contact night-coverage at [email or web address], password [facility] 10/26/2013, 3:47 PM  LOS: 9 days

## 2013-10-27 DIAGNOSIS — E119 Type 2 diabetes mellitus without complications: Secondary | ICD-10-CM

## 2013-10-27 DIAGNOSIS — N179 Acute kidney failure, unspecified: Secondary | ICD-10-CM

## 2013-10-27 DIAGNOSIS — R0902 Hypoxemia: Secondary | ICD-10-CM

## 2013-10-27 LAB — CBC
HCT: 25.3 % — ABNORMAL LOW (ref 36.0–46.0)
Hemoglobin: 8.2 g/dL — ABNORMAL LOW (ref 12.0–15.0)
MCH: 24.5 pg — ABNORMAL LOW (ref 26.0–34.0)
MCHC: 32.4 g/dL (ref 30.0–36.0)
MCV: 75.5 fL — ABNORMAL LOW (ref 78.0–100.0)
Platelets: 369 10*3/uL (ref 150–400)
RBC: 3.35 MIL/uL — ABNORMAL LOW (ref 3.87–5.11)
RDW: 23.7 % — ABNORMAL HIGH (ref 11.5–15.5)
WBC: 8.4 10*3/uL (ref 4.0–10.5)

## 2013-10-27 LAB — BASIC METABOLIC PANEL
BUN: <3 mg/dL — ABNORMAL LOW (ref 6–23)
CO2: 26 mEq/L (ref 19–32)
Calcium: 8.5 mg/dL (ref 8.4–10.5)
Chloride: 102 mEq/L (ref 96–112)
Creatinine, Ser: 0.65 mg/dL (ref 0.50–1.10)
GFR calc Af Amer: >90 mL/min (ref >90)
GFR calc non Af Amer: >90 mL/min (ref >90)
Glucose, Bld: 85 mg/dL (ref 70–99)
Potassium: 3.8 mEq/L (ref 3.7–5.3)
Sodium: 138 mEq/L (ref 137–147)

## 2013-10-27 NOTE — Progress Notes (Signed)
General Surgery [facility]  Patient seen and examined.  No complaints.  Eating more this afternoon and this evening.  Drain removed today.  Will re-evaluate wound in AM 5/2.  Possible discharge home tomorrow.  Earnstine Regal, MD, [facility] Office: [phone]

## 2013-10-27 NOTE — Progress Notes (Signed)
[facility] PROGRESS NOTE  Amy Bentley CWC:[id number] DOB: Sep 01, 1970 DOA: 10/17/2013 PCP: [name], MD  Assessment/Plan:  Principal Problem:   Probable bladder versus colon cancer versus complicated diverticulitis  CEA 125 high at 44, CEA high at 7.8, AFP WNL at 1.9. Urine culture negative, but colo-vesicle fistula is present.  Status post cystoscopy 10/19/13 which showed a 2-3 cm inflammatory mass with surrounding bullous edema and a colovesical fistula. There is also a 5.5 X7 centimeter mass above the dome of the bladder.  Status post open sigmoid colonectomy and colovesical fistula repair with partial cystectomy 10/20/13. Pathology reporting colorectal adenocarcinoma extending into pericolonic connective tissue and into attached urinary bladder wall. Oncology consulted and plan is for patient to follow up with oncology as outpatient.  Active Problems:   Hypokalemia  resolved Should continue to improve with improvement in oral intake.   Severe protein calorie malnutrition / Weight loss / morbid obesity  Has had 10% weight loss over the past 2-3 months and oral intake of less than 75% for greater than one month.  Seen by dietitian. Continue supplements per dietitian recommendations.  Microcytic anemia / Rectal bleeding  Likely iron deficiency from chronic GI with GU blood loss and menstrual losses all contributory.  Received 2 units of packed red blood cells 10/20/13.  Monitor hemoglobin and continue iron supplements. Hemoglobin steady and improved from last check to 8   DVT Prophylaxis  Continue SCDs.   Code Status: Full Family Communication: Discussed directly with patient Disposition Plan: Once cleared for discharge from surgeon's standpoint.   Consultants:  Oncology  Surgery: Dr. Harlow Asa  Urology: Dr. Louis Meckel  Procedures:  As listed above  Antibiotics:  None  HPI/Subjective: No acute issues reported overnight. No new  complaints  Objective: Filed Vitals:   10/27/13 1400  BP: 136/78  Pulse: 99  Temp: 99.6 F (37.6 C)  Resp: 20    Intake/Output Summary (Last 24 hours) at 10/27/13 1817 Last data filed at 10/27/13 1300  Gross per 24 hour  Intake    360 ml  Output   2955 ml  Net  -2595 ml   Filed Weights   10/17/13 0627 10/17/13 1149 10/20/13 1900  Weight: 113.399 kg (250 lb) 113.399 kg (250 lb) 112.2 kg (247 lb 5.7 oz)    Exam:   General:  In no acute distress, alert and awake  Cardiovascular: Regular rate and rhythm, no murmurs rub  Respiratory: clear to auscultation bilaterally no wheezes  Abdomen: Soft, nontender, nondistended  Musculoskeletal: No cyanosis or clubbing  Data Reviewed: Basic Metabolic Panel:  Recent Labs Lab 10/23/13 0405 10/24/13 0412 10/25/13 0402 10/26/13 0355 10/27/13 0405  NA 135* 137 138 140 138  K 3.4* 3.8 3.5* 3.6* 3.8  CL 99 103 103 105 102  CO2 26 25 26 25 26   GLUCOSE 120* 96 100* 112* 85  BUN 3* <3* <3* <3* <3*  CREATININE 0.66 0.68 0.64 0.61 0.65  CALCIUM 7.9* 8.0* 8.2* 8.2* 8.5  MG  --   --   --  1.8  --    Liver Function Tests: No results found for this basename: AST, ALT, ALKPHOS, BILITOT, PROT, ALBUMIN,  in the last 168 hours No results found for this basename: LIPASE, AMYLASE,  in the last 168 hours No results found for this basename: AMMONIA,  in the last 168 hours CBC:  Recent Labs Lab 10/23/13 0405 10/24/13 0412 10/25/13 0402 10/26/13 0355 10/27/13 0405  WBC 8.5 7.7 7.7 7.5 8.4  HGB 8.6*  8.3* 7.6* 8.0* 8.2*  HCT 27.7* 26.4* 24.1* 25.2* 25.3*  MCV 76.3* 77.2* 76.3* 76.8* 75.5*  PLT 321 319 303 334 369   Cardiac Enzymes: No results found for this basename: CKTOTAL, CKMB, CKMBINDEX, TROPONINI,  in the last 168 hours BNP (last 3 results) No results found for this basename: PROBNP,  in the last 8760 hours CBG: No results found for this basename: GLUCAP,  in the last 168 hours  Recent Results (from the past 240 hour(s))   URINE CULTURE     Status: None   Collection Time    10/17/13  9:35 PM      Result Value Ref Range Status   Specimen Description URINE, CLEAN CATCH   Final   Special Requests NONE   Final   Culture  Setup Time     Final   Value: 10/18/2013 00:32     Performed at [place]     Final   Value: NO GROWTH     Performed at [facility]   Culture     Final   Value: NO GROWTH     Performed at [facility]   Report Status 10/18/2013 FINAL   Final  SURGICAL PCR SCREEN     Status: None   Collection Time    10/20/13 10:55 AM      Result Value Ref Range Status   MRSA, PCR NEGATIVE  NEGATIVE Final   Staphylococcus aureus NEGATIVE  NEGATIVE Final   Comment:            The Xpert SA Assay (FDA     approved for NASAL specimens     in patients over 13 years of age),     is one component of     a comprehensive surveillance     program.  Test performance has     been validated by [employer] for patients greater     than or equal to 23 year old.     It is not intended     to diagnose infection nor to     guide or monitor treatment.     Studies: No results found.  Scheduled Meds: . feeding supplement (RESOURCE BREEZE)  1 Container Oral TID WC  . heparin subcutaneous  5,000 Units Subcutaneous 3 times per day  . iron polysaccharides  150 mg Oral Daily  . multivitamins with iron  1 tablet Oral Daily  . mupirocin ointment   Nasal BID  . pantoprazole  20 mg Oral Daily  . pneumococcal 23 valent vaccine  0.5 mL Intramuscular Tomorrow-1000  . polyethylene glycol  17 g Oral BID  . potassium chloride  20 mEq Oral BID WC  . saccharomyces boulardii  250 mg Oral BID   Continuous Infusions:    Time spent: > 35 minutes    [facility] Pager [phone]. If 7PM-7AM, please contact night-coverage at [email or web address], password [facility] 10/27/2013, 6:17 PM  LOS: 10 days

## 2013-10-27 NOTE — Progress Notes (Signed)
7 Days Post-Op  Subjective:  We increased her diet yesterday, but she isn't eating much, grilled cheese last PM, apple sauce this Am.  She has nothing from the drain, but her midline incision closed with staples, has some drainage between staples lower abdomen, just above the panus.  She also has allot of moisture under her panus and her mid line incision goes thru this.  The area of some clear drainage explored with applicator stick and it is 6 cm deep, but not much fluid.  I have not removed any staples.  Soft BM reported yesterday.  Objective: Vital signs in last 24 hours: Temp:  [98.2 F (36.8 C)-99 F (37.2 C)] 99 F (37.2 C) (05/01 0506) Pulse Rate:  [94-107] 94 (05/01 0506) Resp:  [18-20] 20 (05/01 0506) BP: (120-128)/(79-83) 120/80 mmHg (05/01 0506) SpO2:  [95 %-97 %] 95 % (05/01 0506) Last BM Date: 10/25/13 240 PO recorded, no BM recorded. Afebrile, VSS labs OK Intake/Output from previous day: 04/30 0701 - 05/01 0700 In: 240 [P.O.:240] Out: 4587 [Urine:4500; Drains:87] Intake/Output this shift: Total I/O In: 0  Out: 630 [Urine:600; Drains:30]  General appearance: alert, cooperative and no distress Resp: clear to auscultation bilaterally GI: abdominal wound is clean there is some clear drainage lower abdomen incision just above the panus.  incision is 6 cm deep at this point.  she is tolerating full diet, but not taking much.  + BM.  Lab Results:   Recent Labs  10/26/13 0355 10/27/13 0405  WBC 7.5 8.4  HGB 8.0* 8.2*  HCT 25.2* 25.3*  PLT 334 369    BMET  Recent Labs  10/26/13 0355 10/27/13 0405  NA 140 138  K 3.6* 3.8  CL 105 102  CO2 25 26  GLUCOSE 112* 85  BUN <3* <3*  CREATININE 0.61 0.65  CALCIUM 8.2* 8.5   PT/INR No results found for this basename: LABPROT, INR,  in the last 72 hours  No results found for this basename: AST, ALT, ALKPHOS, BILITOT, PROT, ALBUMIN,  in the last 168 hours   Lipase  No results found for this basename: lipase      Studies/Results: No results found.  Medications: . feeding supplement (RESOURCE BREEZE)  1 Container Oral TID WC  . heparin subcutaneous  5,000 Units Subcutaneous 3 times per day  . iron polysaccharides  150 mg Oral Daily  . multivitamins with iron  1 tablet Oral Daily  . mupirocin ointment   Nasal BID  . pantoprazole  20 mg Oral Daily  . pneumococcal 23 valent vaccine  0.5 mL Intramuscular Tomorrow-1000  . polyethylene glycol  17 g Oral BID  . potassium chloride  20 mEq Oral BID WC  . saccharomyces boulardii  250 mg Oral BID    Assessment/Plan . Pelvic mass with colovesical fistula; Sigmoid colectomy, takedown of colovesical fistula, Stark Klein, MD, 10/20/2013.  2. Partial cystectomy, Bladder was reconstructed in two layers. Significantly reduced bladder capacity. Ardis Hughs, MD, 10/20/13. Foley for 2 weeks.  3. UTI  4. Weight loss  5. BMI 47  6. Hx of tobacco use  7. Anemia/ heparin stopped with drop in H/H, on 4/27/ Transfused 10/20/13.  8. SCD for DVT  Plan:  She needs to learn how to use leg bag and regular foley for home use.  I am still concerned she isn't eating much and some drainage from her midline incision, it's clear, but concerning.  I will discuss with Dr. Harlow Asa. JP drain removed.  LOS:  10 days    Earnstine Regal 10/27/2013

## 2013-10-27 NOTE — Progress Notes (Signed)
Follow-up scheduled for May 18th at 9:30am at [facility] for cystogram and appt to see me following.

## 2013-10-28 LAB — BASIC METABOLIC PANEL
BUN: 3 mg/dL — ABNORMAL LOW (ref 6–23)
CO2: 26 mEq/L (ref 19–32)
Calcium: 8.6 mg/dL (ref 8.4–10.5)
Chloride: 101 mEq/L (ref 96–112)
Creatinine, Ser: 0.67 mg/dL (ref 0.50–1.10)
GFR calc Af Amer: >90 mL/min (ref >90)
GFR calc non Af Amer: >90 mL/min (ref >90)
Glucose, Bld: 95 mg/dL (ref 70–99)
Potassium: 3.8 mEq/L (ref 3.7–5.3)
Sodium: 138 mEq/L (ref 137–147)

## 2013-10-28 LAB — CBC
HCT: 26.4 % — ABNORMAL LOW (ref 36.0–46.0)
Hemoglobin: 8.3 g/dL — ABNORMAL LOW (ref 12.0–15.0)
MCH: 24.1 pg — ABNORMAL LOW (ref 26.0–34.0)
MCHC: 31.4 g/dL (ref 30.0–36.0)
MCV: 76.7 fL — ABNORMAL LOW (ref 78.0–100.0)
Platelets: 387 10*3/uL (ref 150–400)
RBC: 3.44 MIL/uL — ABNORMAL LOW (ref 3.87–5.11)
RDW: 23.5 % — ABNORMAL HIGH (ref 11.5–15.5)
WBC: 9.8 10*3/uL (ref 4.0–10.5)

## 2013-10-28 MED ORDER — OXYCODONE-ACETAMINOPHEN 5-325 MG PO TABS: 2.0000 | ORAL_TABLET | ORAL | Status: DC | PRN

## 2013-10-28 MED ORDER — BOOST / RESOURCE BREEZE PO LIQD: 1.0000 | Freq: Three times a day (TID) | ORAL | Status: DC

## 2013-10-28 MED ORDER — PANTOPRAZOLE SODIUM 20 MG PO TBEC: 20.0000 mg | DELAYED_RELEASE_TABLET | Freq: Every day | ORAL | Status: DC

## 2013-10-28 MED ORDER — POLYSACCHARIDE IRON COMPLEX 150 MG PO CAPS: 150.0000 mg | ORAL_CAPSULE | Freq: Every day | ORAL | Status: DC

## 2013-10-28 MED ORDER — IBUPROFEN 600 MG PO TABS: 600.0000 mg | ORAL_TABLET | Freq: Four times a day (QID) | ORAL | Status: DC | PRN

## 2013-10-28 MED ORDER — SACCHAROMYCES BOULARDII 250 MG PO CAPS: 250.0000 mg | ORAL_CAPSULE | Freq: Two times a day (BID) | ORAL | Status: DC

## 2013-10-28 NOTE — Plan of Care (Signed)
Problem: Phase I Progression Outcomes Goal: Voiding-avoid urinary catheter unless indicated Outcome: Not Met (add Reason) Pt to be discharged with foley catheter.     

## 2013-10-28 NOTE — Discharge Instructions (Signed)
ABDOMINAL SURGERY: POST OP INSTRUCTIONS  1. DIET: Follow a light bland diet the first 24 hours after arrival home, such as soup, liquids, crackers, etc.  Be sure to include lots of fluids daily.  Avoid fast food or heavy meals as your are more likely to get nauseated.  Do not eat any uncooked fruits or vegetables for the next 2 weeks as your colon heals. 2. Take your usually prescribed home medications unless otherwise directed. 3. PAIN CONTROL: a. Pain is best controlled by a usual combination of three different methods TOGETHER: i. Ice/Heat ii. Over the counter pain medication iii. Prescription pain medication b. Most patients will experience some swelling and bruising around the incisions.  Ice packs or heating pads (30-60 minutes up to 6 times a day) will help. Use ice for the first few days to help decrease swelling and bruising, then switch to heat to help relax tight/sore spots and speed recovery.  Some people prefer to use ice alone, heat alone, alternating between ice & heat.  Experiment to what works for you.  Swelling and bruising can take several weeks to resolve.   c. It is helpful to take an over-the-counter pain medication regularly for the first few weeks.  Choose one of the following that works best for you: i. Naproxen (Aleve, etc)  Two 220mg  tabs twice a day ii. Ibuprofen (Advil, etc) Three 200mg  tabs four times a day (every meal & bedtime) iii. Acetaminophen (Tylenol, etc) 500-650mg  four times a day (every meal & bedtime) d. A  prescription for pain medication (such as oxycodone, hydrocodone, etc) should be given to you upon discharge.  Take your pain medication as prescribed.  i. If you are having problems/concerns with the prescription medicine (does not control pain, nausea, vomiting, rash, itching, etc), please call us [phone] to see if we need to switch you to a different pain medicine that will work better for you and/or control your side effect better. ii. If you  need a refill on your pain medication, please contact your pharmacy.  They will contact our office to request authorization. Prescriptions will not be filled after 5 pm or on week-ends. 4. Avoid getting constipated.  Between the surgery and the pain medications, it is common to experience some constipation.  Increasing fluid intake and taking a fiber supplement (such as Metamucil, Citrucel, FiberCon, MiraLax, etc) 1-2 times a day regularly will usually help prevent this problem from occurring.  A mild laxative (prune juice, Milk of Magnesia, MiraLax, etc) should be taken according to package directions if there are no bowel movements after 48 hours.   5. Watch out for diarrhea.  If you have many loose bowel movements, simplify your diet to bland foods & liquids for a few days.  Stop any stool softeners and decrease your fiber supplement.  Switching to mild anti-diarrheal medications (Kayopectate, Pepto Bismol) can help.  If this worsens or does not improve, please call us. 6. Wash / shower every day.  You may shower over the incision / wound.  Avoid baths until the skin is fully healed.  Continue to shower over incision(s) after the dressing is off. 7. Change your dressing dialy. 8. ACTIVITIES as tolerated:   a. You may resume regular (light) daily activities beginning the next day--such as daily self-care, walking, climbing stairs--gradually increasing activities as tolerated.  If you can walk 30 minutes without difficulty, it is safe to try more intense activity such as jogging, treadmill, bicycling, low-impact aerobics, swimming, etc. b.  Save the most intensive and strenuous activity for last such as sit-ups, heavy lifting, contact sports, etc  Refrain from any heavy lifting or straining until you are off narcotics for pain control.   °c. DO NOT PUSH THROUGH PAIN.  Let pain be your guide: If it hurts to do something, don't do it.  Pain is your body warning you to avoid that activity for another week until  the pain goes down. °d. You may drive when you are no longer taking prescription pain medication, you can comfortably wear a seatbelt, and you can safely maneuver your car and apply brakes. °e. You may have sexual intercourse when it is comfortable.  °9. FOLLOW UP in our office °a. Please call [facility] at [phone] to set up an appointment to see your surgeon in the office for a follow-up appointment approximately 1-2 weeks after your surgery. °b. Make sure that you call for this appointment the day you arrive home to insure a convenient appointment time. °10. IF YOU HAVE DISABILITY OR FAMILY LEAVE FORMS, BRING THEM TO THE OFFICE FOR PROCESSING.  DO NOT GIVE THEM TO YOUR DOCTOR. ° ° °WHEN TO CALL US [phone]: °1. Poor pain control °2. Reactions / problems with new medications (rash/itching, nausea, etc)  °3. Fever over 101.5 F (38.5 C) °4. Inability to urinate °5. Nausea and/or vomiting °6. Worsening swelling or bruising °7. Continued bleeding from incision. °8. Increased pain, redness, or drainage from the incision ° °The clinic staff is available to answer your questions during regular business hours (8:30am-5pm).  Please don’t hesitate to call and ask to speak to one of our nurses for clinical concerns.   A surgeon from [facility] is always on call at the hospitals °  °If you have a medical emergency, go to the nearest emergency room or call 911. °  ° °[facility] °[address], [address], [place], [place]  [address] ? °MAIN: [phone] ? TOLL FREE: [phone] ? °FAX [phone] °[email or web address] ° ° °

## 2013-10-28 NOTE — Progress Notes (Signed)
8 Days Post-Op  Subjective:  Eating better.  Denies nausea.  Pain controlled.  Having several small BM's a day.   Objective: Vital signs in last 24 hours: Temp:  [98.1 F (36.7 C)-99.6 F (37.6 C)] 98.1 F (36.7 C) (05/02 0540) Pulse Rate:  [90-99] 90 (05/02 0540) Resp:  [18-20] 18 (05/02 0540) BP: (116-136)/(76-81) 131/81 mmHg (05/02 0540) SpO2:  [95 %-99 %] 95 % (05/02 0540) Last BM Date: 10/28/13 240 PO recorded, no BM recorded. Afebrile, VSS labs OK Intake/Output from previous day: 05/01 0701 - 05/02 0700 In: 360 [P.O.:360] Out: 1230 [Urine:1200; Drains:30] Intake/Output this shift:    General appearance: alert, cooperative and no distress Resp: clear to auscultation bilaterally GI: abdominal wound is clean there is some clear drainage lower abdomen incision just above the panus.  Lab Results:   Recent Labs  10/27/13 0405 10/28/13 0428  WBC 8.4 9.8  HGB 8.2* 8.3*  HCT 25.3* 26.4*  PLT 369 387    BMET  Recent Labs  10/27/13 0405 10/28/13 0428  NA 138 138  K 3.8 3.8  CL 102 101  CO2 26 26  GLUCOSE 85 95  BUN <3* 3*  CREATININE 0.65 0.67  CALCIUM 8.5 8.6   PT/INR No results found for this basename: LABPROT, INR,  in the last 72 hours  No results found for this basename: AST, ALT, ALKPHOS, BILITOT, PROT, ALBUMIN,  in the last 168 hours   Lipase  No results found for this basename: lipase     Studies/Results: No results found.  Medications: . feeding supplement (RESOURCE BREEZE)  1 Container Oral TID WC  . heparin subcutaneous  5,000 Units Subcutaneous 3 times per day  . iron polysaccharides  150 mg Oral Daily  . multivitamins with iron  1 tablet Oral Daily  . mupirocin ointment   Nasal BID  . pantoprazole  20 mg Oral Daily  . pneumococcal 23 valent vaccine  0.5 mL Intramuscular Tomorrow-1000  . polyethylene glycol  17 g Oral BID  . potassium chloride  20 mEq Oral BID WC  . saccharomyces boulardii  250 mg Oral BID    Assessment/Plan .  Pelvic mass with colovesical fistula; Sigmoid colectomy, takedown of colovesical fistula, Stark Klein, MD, 10/20/2013.  2. Partial cystectomy, Bladder was reconstructed in two layers. Significantly reduced bladder capacity. Ardis Hughs, MD, 10/20/13. Foley for 2 weeks.  3. UTI  4. Weight loss  5. BMI 47  6. Hx of tobacco use  7. Anemia/ heparin stopped with drop in H/H, on 4/27/ Transfused 10/20/13.  8. SCD for DVT  Plan:  Ok to d/c from our standpoint.  She will have RN visit next week for wound check and staple removal.  F/U with Dr [name] in 2 weeks.     LOS: 11 days    Leighton Ruff 07/31/1937

## 2013-10-28 NOTE — Discharge Summary (Signed)
Physician Discharge Summary  Amy Bentley [id number] DOB: April 30, 1971 DOA: 10/17/2013  PCP: [name], MD  Admit date: 10/17/2013 Discharge date: 10/28/2013  Time spent: > 35 minutes  Recommendations for Outpatient Follow-up:  1. Followup with Hemoglobin levels 2. Patient to followup with Dr. [name] and Urologist 3. Also will need to followup with oncologist  Discharge Diagnoses:  Please see below under hospital course  Discharge Condition: Stable  Diet recommendation: Soft, heart healthy  Filed Weights   10/17/13 0627 10/17/13 1149 10/20/13 1900  Weight: 113.399 kg (250 lb) 113.399 kg (250 lb) 112.2 kg (247 lb 5.7 oz)    History of present illness/Hospital Course:   The patient is a 43 year old female with recurrent UTIs who presented to the hospital with several months complaint of worsening lower water and abdominal pain.CT scan shows: 5.5 x 7 cm mass lesion above the dome of the bladder with bladder invasion   Probable bladder versus colon cancer versus complicated diverticulitis  CEA 125 high at 44, CEA high at 7.8, AFP WNL at 1.9. Urine culture negative, but colo-vesicle fistula is present.  Status post cystoscopy 10/19/13 which showed a 2-3 cm inflammatory mass with surrounding bullous edema and a colovesical fistula. There is also a 5.5 X7 centimeter mass above the dome of the bladder.  Status post open sigmoid colonectomy and colovesical fistula repair with partial cystectomy 10/20/13. Pathology reporting colorectal adenocarcinoma extending into pericolonic connective tissue and into attached urinary bladder wall. Oncology consulted and plan is for patient to follow up with oncology as outpatient.   Active Problems:   Hypokalemia  resolved  Should continue to improve with improvement in oral intake.   Severe protein calorie malnutrition / Weight loss / morbid obesity  Has had 10% weight loss over the past 2-3 months and oral intake of less than 75% for  greater than one month.  Seen by dietitian. Continue supplements per dietitian recommendations.  Microcytic anemia / Rectal bleeding  Likely iron deficiency from chronic GI with GU blood loss and menstrual losses all contributory.  Received 2 units of packed red blood cells 10/20/13.  Will discharge with iron supplementation currently stable and improving on daily checks   Procedures:  As mentioned above  Consultations:  Oncology  General surgery Dr.Byerly  Urology: Dr. Louis Meckel  Discharge Exam: Filed Vitals:   10/28/13 1400  BP: 120/78  Pulse: 101  Temp: 98.5 F (36.9 C)  Resp: 18    General: Pt in NAD, alert and awake Cardiovascular: RRR, no MRG Respiratory: No increased work of breathing breath sounds heard bilaterally, no audible wheezing  Discharge Instructions You were cared for by a hospitalist during your hospital stay. If you have any questions about your discharge medications or the care you received while you were in the hospital after you are discharged, you can call the unit and asked to speak with the hospitalist on call if the hospitalist that took care of you is not available. Once you are discharged, your primary care physician will handle any further medical issues. Please note that NO REFILLS for any discharge medications will be authorized once you are discharged, as it is imperative that you return to your primary care physician (or establish a relationship with a primary care physician if you do not have one) for your aftercare needs so that they can reassess your need for medications and monitor your lab values.  Discharge Orders   Future Appointments Provider Department Dept Phone   11/06/2013 1:30 PM [facility] [phone]   11/06/2013 2:00 PM Ladell Pier, MD [facility] [phone]   Future Orders Complete By Expires   Call MD for:  difficulty  breathing, headache or visual disturbances  As directed    Call MD for:  severe uncontrolled pain  As directed    Call MD for:  temperature >100.4  As directed    Diet - low sodium heart healthy  As directed    Discharge instructions  As directed    Increase activity slowly  As directed        Medication List    STOP taking these medications       naproxen sodium 220 MG tablet  Commonly known as:  ANAPROX      TAKE these medications       AZO CRANBERRY 250-30 MG Tabs  Take 2 tablets by mouth 5 (five) times daily.     feeding supplement (RESOURCE BREEZE) Liqd  Take 1 Container by mouth 3 (three) times daily with meals.     ibuprofen 600 MG tablet  Commonly known as:  ADVIL,MOTRIN  Take 1 tablet (600 mg total) by mouth every 6 (six) hours as needed for fever, headache, mild pain, moderate pain or cramping.     iron polysaccharides 150 MG capsule  Commonly known as:  NIFEREX  Take 1 capsule (150 mg total) by mouth daily.     methocarbamol 500 MG tablet  Commonly known as:  ROBAXIN  Take 500 mg by mouth every 6 (six) hours as needed for muscle spasms.     oxyCODONE-acetaminophen 5-325 MG per tablet  Commonly known as:  PERCOCET/ROXICET  Take 2 tablets by mouth every 4 (four) hours as needed for severe pain.     pantoprazole 20 MG tablet  Commonly known as:  PROTONIX  Take 1 tablet (20 mg total) by mouth daily.     saccharomyces boulardii 250 MG capsule  Commonly known as:  FLORASTOR  Take 1 capsule (250 mg total) by mouth 2 (two) times daily.       Allergies  Allergen Reactions  . Bactrim [Sulfamethoxazole-Tmp Ds]        Follow-up Information   Follow up with Ardis Hughs, MD On 11/13/2013. (9:30am)    Specialty:  Urology   Contact information:   [facility]  [place] [phone]       Follow up with [facility], MD. Schedule an appointment as soon as possible for a visit in 2 weeks.    Specialty:  General Surgery   Contact information:   [address] [place] [address] [phone]        The results of significant diagnostics from this hospitalization (including imaging, microbiology, ancillary and laboratory) are listed below for reference.    Significant Diagnostic Studies: Ct Abdomen Pelvis Wo Contrast  10/17/2013   CLINICAL DATA:  Right flank pain and hematuria. Dysuria. Recent UTI  EXAM: CT ABDOMEN AND PELVIS WITHOUT CONTRAST  TECHNIQUE: Multidetector CT imaging of the abdomen and pelvis was performed following the standard protocol without intravenous contrast.  COMPARISON:  None.  FINDINGS: Lung bases are clear. Unenhanced images of the liver gallbladder and bile ducts are normal. Pancreas and spleen are normal.  The kidneys show no obstruction or mass.  No urinary tract calculi.  Thickening of the sigmoid colon just above the urinary bladder. There is stranding in  the pericolonic fat. There is a soft tissue structure above the bladder and below the sigmoid thickening containing gas bubbles, worrisome for abscess. This structure measures 6.5 x 5.8 cm. No free air or free fluid is identified. Urinary bladder appears to be empty and presumably is below this abscess. Further evaluation with oral and IV contrast is suggested.  Appendix is normal.  No acute bony change.  IMPRESSION: Negative for urinary tract calculi.  Thickening of the sigmoid colon consistent with diverticulitis. Just below the thickened sigmoid colon, there is a soft tissue mass containing gas bubbles, worrisome for abscess. Further evaluation with oral and IV contrast is suggested to confirm these findings.  I discussed the findings with Dr. Tamera Punt.   Electronically Signed   By: Franchot Gallo M.D.   On: 10/17/2013 07:39   Ct Abdomen Pelvis W Contrast  10/17/2013   CLINICAL DATA:  Pelvic pain with dysuria and hematuria. Rule out diverticulitis or tumor.  EXAM: CT ABDOMEN AND PELVIS WITH  CONTRAST  TECHNIQUE: Multidetector CT imaging of the abdomen and pelvis was performed using the standard protocol following bolus administration of intravenous contrast.  CONTRAST:  171mL OMNIPAQUE IOHEXOL 300 MG/ML  SOLN  COMPARISON:  CT abdomen without contrast earlier today  FINDINGS: Lung bases are clear. Liver gallbladder and bile ducts are normal. Pancreas and spleen are normal. No renal obstruction or renal mass.  Segmental thickening of the sigmoid colon is again noted in the midline. There is mild stranding in the pericolonic fat. Irregular soft tissue mass is present above the bladder with invasion of the dome of the bladder. Delayed images of the bladder reveal irregularity of the dome of the bladder with apparent invasion by the mass lesion. The appearance is most consistent with tumor rather than infection. This mass contains enhancing soft tissue as well as central fluid and gas. The mass measures approximately 5.5 x 7 cm and is located in the midline. There is a small amount of gas in the bladder due to bowel communication. There is a fistulous tract between the sigmoid colon and the mass.  Several gas bubbles are present posterior to the sigmoid colon which may represent extra luminal gas. There is a small amount of free fluid. Small subcentimeter mesenteric lymph nodes are present which could be due to metastatic disease.  Negative for bowel obstruction. Appendix normal. Uterus is not enlarged and appears separate from the mass.  IMPRESSION: 5.5 x 7 cm mass lesion above the dome of the bladder with bladder invasion. There is also segmental thickening of the sigmoid colon. There is a fistulous communication between the mass in the sigmoid colon. Differential includes infection and tumor. I would favor tumor given the enhancing soft tissue mass lesion. This may be a urogenital neoplasm such is a bladder carcinoma or urachal carcinoma invading the sigmoid colon. Colon cancer invading the bladder is  also possible. Mildly prominent mesenteric lymph nodes could be due to metastatic disease. Small gas bubbles are present posterior to sigmoid colon suggesting contained perforation. Small amount of free fluid.   Electronically Signed   By: Franchot Gallo M.D.   On: 10/17/2013 09:51    Microbiology: Recent Results (from the past 240 hour(s))  SURGICAL PCR SCREEN     Status: None   Collection Time    10/20/13 10:55 AM      Result Value Ref Range Status   MRSA, PCR NEGATIVE  NEGATIVE Final   Staphylococcus aureus NEGATIVE  NEGATIVE Final   Comment:  The Xpert SA Assay (FDA     approved for NASAL specimens     in patients over 44 years of age),     is one component of     a comprehensive surveillance     program.  Test performance has     been validated by [employer] for patients greater     than or equal to 37 year old.     It is not intended     to diagnose infection nor to     guide or monitor treatment.     Labs: Basic Metabolic Panel:  Recent Labs Lab 10/24/13 0412 10/25/13 0402 10/26/13 0355 10/27/13 0405 10/28/13 0428  NA 137 138 140 138 138  K 3.8 3.5* 3.6* 3.8 3.8  CL 103 103 105 102 101  CO2 25 26 25 26 26   GLUCOSE 96 100* 112* 85 95  BUN <3* <3* <3* <3* 3*  CREATININE 0.68 0.64 0.61 0.65 0.67  CALCIUM 8.0* 8.2* 8.2* 8.5 8.6  MG  --   --  1.8  --   --    Liver Function Tests: No results found for this basename: AST, ALT, ALKPHOS, BILITOT, PROT, ALBUMIN,  in the last 168 hours No results found for this basename: LIPASE, AMYLASE,  in the last 168 hours No results found for this basename: AMMONIA,  in the last 168 hours CBC:  Recent Labs Lab 10/24/13 0412 10/25/13 0402 10/26/13 0355 10/27/13 0405 10/28/13 0428  WBC 7.7 7.7 7.5 8.4 9.8  HGB 8.3* 7.6* 8.0* 8.2* 8.3*  HCT 26.4* 24.1* 25.2* 25.3* 26.4*  MCV 77.2* 76.3* 76.8* 75.5* 76.7*  PLT 319 303 334 369 387   Cardiac Enzymes: No results found for this basename: CKTOTAL, CKMB,  CKMBINDEX, TROPONINI,  in the last 168 hours BNP: BNP (last 3 results) No results found for this basename: PROBNP,  in the last 8760 hours CBG: No results found for this basename: GLUCAP,  in the last 168 hours     Signed:  Velvet Bathe  [facility] 10/28/2013, 3:49 PM

## 2013-10-28 NOTE — Progress Notes (Signed)
It was reported to me that pt had been taught how to take care of her leg bag attached to her foley catheter. Review instructions with pt, she verbalized understanding of how to do so.

## 2013-10-29 NOTE — Progress Notes (Signed)
CARE MANAGEMENT NOTE 10/29/2013  Patient:  Amy Bentley, Amy Bentley   Account Number:  [id number]  Date Initiated:  10/17/2013  Documentation initiated by:  DAVIS,TYMEEKA  Subjective/Objective Assessment:   43 yo female admitted with UTI. Hx of bladder cancer.     Action/Plan:   Home when stable   Anticipated DC Date:  10/26/2013   Anticipated DC Plan:  HOME/SELF CARE  In-house referral  [place]  CM consult  PCP issues      Choice offered to / List presented to:  NA   DME arranged  NA      DME agency  NA     [place] arranged  NA      [place] agency  NA   Status of service:  Completed, signed off Medicare Important Message given?   (If response is "NO", the following Medicare IM given date fields will be blank) Date Medicare IM given:   Date Additional Medicare IM given:    Discharge Disposition:  HOME/SELF CARE  Per UR Regulation:  Reviewed for med. necessity/level of care/duration of stay  If discussed at [place] of Stay Meetings, dates discussed:    Comments:  10/29/2013 1815 NCM spoke to pt and she has appt with Urologist on 5/18 to have foley removed. Will follow up with Urology if any complications with foley. Pt is self-pay and explained she will have to pay for [facility] RN services out of pocket. Provided pt with info for [facility] and Well[facility] to establish with a PCP. Pt was able to afford medications. Jonnie Finner RN CCM Case Mgmt phone [phone]  10/17/13 [place] [phone] Chart reviewed for utilization of services. PCP: [name], MD. Self-pay. pt may qualify for [place] at time of discharge. Financial counselor to consult.

## 2013-10-30 ENCOUNTER — Telehealth: Payer: Self-pay | Admitting: *Deleted

## 2013-10-30 NOTE — Telephone Encounter (Signed)
Spoke with patient by phone and confirmed appointment with Dr. Benay Spice for 11/06/13.

## 2013-11-01 ENCOUNTER — Encounter (INDEPENDENT_AMBULATORY_CARE_PROVIDER_SITE_OTHER): Payer: Self-pay

## 2013-11-06 ENCOUNTER — Ambulatory Visit: Payer: No Typology Code available for payment source

## 2013-11-06 ENCOUNTER — Encounter (INDEPENDENT_AMBULATORY_CARE_PROVIDER_SITE_OTHER): Payer: Self-pay | Admitting: General Surgery

## 2013-11-06 ENCOUNTER — Other Ambulatory Visit (INDEPENDENT_AMBULATORY_CARE_PROVIDER_SITE_OTHER): Payer: Self-pay | Admitting: General Surgery

## 2013-11-06 ENCOUNTER — Encounter: Payer: Self-pay | Admitting: Oncology

## 2013-11-06 ENCOUNTER — Telehealth: Payer: Self-pay | Admitting: Oncology

## 2013-11-06 ENCOUNTER — Ambulatory Visit (INDEPENDENT_AMBULATORY_CARE_PROVIDER_SITE_OTHER): Payer: Self-pay | Admitting: General Surgery

## 2013-11-06 ENCOUNTER — Ambulatory Visit (HOSPITAL_BASED_OUTPATIENT_CLINIC_OR_DEPARTMENT_OTHER): Payer: Self-pay | Admitting: Oncology

## 2013-11-06 VITALS — BP 134/80 | HR 88 | Temp 97.0°F | Resp 16 | Ht 61.0 in | Wt 228.0 lb

## 2013-11-06 VITALS — BP 134/91 | HR 76 | Temp 99.0°F | Resp 20 | Ht 61.0 in | Wt 228.5 lb

## 2013-11-06 DIAGNOSIS — C187 Malignant neoplasm of sigmoid colon: Secondary | ICD-10-CM

## 2013-11-06 DIAGNOSIS — C189 Malignant neoplasm of colon, unspecified: Secondary | ICD-10-CM

## 2013-11-06 DIAGNOSIS — D509 Iron deficiency anemia, unspecified: Secondary | ICD-10-CM

## 2013-11-06 NOTE — Assessment & Plan Note (Signed)
Patient has appointment today with Dr. Benay Spice. She will likely need referral to genetics because of her age.  If she needs IV chemotherapy, we will place a Port-A-Cath. I briefly discussed this.  She is advised to a dry dressing on her wound if it is draining. Otherwise, she can leave it open to air. She is advised to maintain lifting restrictions. She has an appointment with Dr. Louis Meckel for potential Foley removal later this month.  I will see her back in 3 months.

## 2013-11-06 NOTE — Telephone Encounter (Signed)
gv pt appt schedule for may. central will call re ct appt - pt aware. Message to [facility] @ [facility] via EPIC requesting appt w/Dr. [name] for port placement. Referral in EPIC. [facility] will contact pt - pt aware.

## 2013-11-06 NOTE — [facility] Oncology Navigator Note (Signed)
Met with [employer] and family. Explained role of nurse navigator. Educational information provided on colorectal cancer.  Referral made to dietician for diet education. [place] resources provided to patient, including SW service and support group information.  Education provided on smoking cessation and port-a-cath  insertion information was given.  Contact names and phone numbers were provided for entire [facility] team.  Teach back method was used.  No barriers to care identified.  Will continue to follow as needed.

## 2013-11-06 NOTE — Progress Notes (Signed)
Checked in new pt with no insurance.  Pt informed me that she applied for Medicaid with the financial advocate Ivin Booty at the hospital and is awaiting approval.  I informed pt that if she gets denied for Medicaid she can apply for financial assistance thru the hospital but that denial letter needs to be attached to the application.  She understood.

## 2013-11-06 NOTE — Patient Instructions (Signed)
Follow up with me in 3 months unless you have issued earlier or need a port a cath.    We will make referral to GI for colonoscopy in august/september.

## 2013-11-06 NOTE — Progress Notes (Signed)
HISTORY: Pt is around 3 weeks s/p sigmoid resection and en bloc bladder resection for colon cancer fistulizing to bladder.  She is doing well.  She is eating OK.  She is not having fever/ chills.  She is having BMs.      EXAM: General:  Alert and oriented.   Incision:  Healing well.  Some granulation tissue that is cauterized.     PATHOLOGY: Diagnosis Colon, segmental resection for tumor, with bladder dome and sigmoid colon - COLORECTAL ADENOCARCINOMA EXTENDING INTO PERICOLONIC CONNECTIVE TISSUE AND INTO ATTACHED URINARY BLADDER WALL. - MARGINS NOT INVOLVED. - NINE BENIGN LYMPH NODES (0/9). LN upgraded to 15 negative nodes.     ASSESSMENT AND PLAN:   Cancer of sigmoid colon, invading bladder, T4N0, s/p en bloc resection with Dr. Louis Meckel 4/24 Patient has appointment today with Dr. Benay Spice. She will likely need referral to genetics because of her age.  If she needs IV chemotherapy, we will place a Port-A-Cath. I briefly discussed this.  She is advised to a dry dressing on her wound if it is draining. Otherwise, she can leave it open to air. She is advised to maintain lifting restrictions. She has an appointment with Dr. Louis Meckel for potential Foley removal later this month.  I will see her back in 3 months.        Milus Height, MD Surgical Oncology, [facility]  [name], MD No ref. provider found

## 2013-11-06 NOTE — Progress Notes (Signed)
Amy Bentley   Referring TM:[id number] Amy Bentley 43 y.o.  05-08-1971    Reason for Referral: Colon cancer   HPI: She reports recurrent urinary tract infections beginning in November of 2014. She presented to the emergency room 10/17/2013 with persistent dysuria and low abdominal pain. She noted "clots "in the urine. A CT of the abdomen and pelvis in the emergency room revealed clear lung bases and a normal liver. Segmental thickening of the sigmoid colon was noted in the midline with stranding in the pericolonic fat. Irregular soft tissue mass was present above the bladder with invasion of the bladder dome. The mass contained enhancing soft tissue and central fluid/gas. Gas was noted in the bladder. Subcentimeter mesenteric lymph nodes. Small gas bubbles were noted posterior to the sigmoid colon suggesting a contained perforation.  She was taken to the operating room on 10/20/2013. A mass was noted between the colon and bladder. Adhesions to the anterior uterus and ovary were dissected bluntly. The proximal rectum was identified and divided above the peritoneal reflection. The proximal sigmoid was divided. The inflammatory mass was adherent to a portion of the bladder measuring 0.6 cm in diameter. The bladder was resected adjacent to the mass. The mass was removed en bloc  The pathology ([id number]) confirmed a moderately differentiated adenocarcinoma of the sigmoid colon. Tumor invaded into pericolonic connective tissue and the wall of the urinary bladder. Lymphovascular invasion was not identified. Perineural invasion was present. No macroscopic tumor perforation. The proximal, distal, and circumferential margins were negative. 15 lymph nodes were negative for metastatic carcinoma. The tumor returned as a pathologic T4b, N0 lesion. No loss of mismatch repair protein expression.  She is recovering from surgery. The abdominal wound is healing. A  Foley catheter remains in place.   Past medical history: 1. G2 P2  Past Surgical History  Procedure Laterality Date  . Cesarean section      x 2  . Colon resection N/A 10/20/2013    Procedure: OPEN SIGMOID COLONECTOMY COLOVESICAL FISTULA REPAIR;  Surgeon: Amy Klein, MD;  Location: [facility];  Service: General;  Laterality: N/A;  . Cystectomy N/A 10/20/2013    Procedure: CYSTECTOMY PARTIAL;  Surgeon: Amy Hughs, MD;  Location: [facility];  Service: Urology;  Laterality: N/A;    Medications: Reviewed  Allergies:  Allergies  Allergen Reactions  . Bactrim [Sulfamethoxazole-Tmp Ds]     Family history: Her father had prostate cancer at age 35, her brother died from "cancer "at age 41, a maternal aunt had lung cancer and was a nonsmoker  Social History:   She lives in [place]. 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 She quit smoking cigarettes 10/17/2013. No alcohol use. No risk factor for HIV or hepatitis.   ROS:   Positives include: 30 pound weight loss and anorexia prior to surgery, clots in the urine prior to surgery, constipation, rash over the face for several months, constipation and diarrhea prior to surgery, rectal bleeding prior to surgery.  A complete ROS was otherwise negative.  Physical Exam:  Blood pressure 134/91, pulse 76, temperature 99 F (37.2 C), temperature source Oral, resp. rate 20, height 5' 1" (1.549 m), weight 228 lb 8 oz (103.647 kg), last menstrual period 10/14/2013, SpO2 100.00%.  HEENT: Oropharynx without visible mass, neck without Lungs: Clear bilaterally Cardiac: Regular rate and rhythm Abdomen: No hepatosplenomegaly, nontender, no mass, healing midline incision with several superficial opening areas  Vascular: No leg edema  Lymph nodes: No cervical, supraclavicular, axillary, or inguinal nodes Neurologic: Alert and oriented, the motor exam appears intact in the upper and lower extremities Skin: Dark brown slightly raised rash  over the cheeks and chin, similar rash at the lower back Musculoskeletal: No spine tenderness   LAB:  CBC  Lab Results  Component Value Date   WBC 9.8 10/28/2013   HGB 8.3* 10/28/2013   HCT 26.4* 10/28/2013   MCV 76.7* 10/28/2013   PLT 387 10/28/2013   NEUTROABS 6.5 10/17/2013     CMP      Component Value Date/Time   NA 138 10/28/2013 0428   K 3.8 10/28/2013 0428   CL 101 10/28/2013 0428   CO2 26 10/28/2013 0428   GLUCOSE 95 10/28/2013 0428   BUN 3* 10/28/2013 0428   CREATININE 0.67 10/28/2013 0428   CALCIUM 8.6 10/28/2013 0428   PROT 7.4 10/17/2013 0650   ALBUMIN 3.3* 10/17/2013 0650   AST 14 10/17/2013 0650   ALT 9 10/17/2013 0650   ALKPHOS 94 10/17/2013 0650   BILITOT 0.3 10/17/2013 0650   GFRNONAA >90 10/28/2013 0428   GFRAA >90 10/28/2013 0428    Lab Results  Component Value Date   CEA 7.8* 10/18/2013    Imaging:  As per history of present illness   Assessment/Plan:   1. Stage IIC (T4b, N0), adenocarcinoma the sigmoid colon, status post a partial colectomy and en bloc bladder resection 10/20/2013, negative surgical margins, no preoperative colonoscopy  Mildly elevated preoperative CEA  2. Microcytic anemia-likely add deficiency anemia   Disposition:   Amy Bentley has been diagnosed with stage IIc colon cancer. She is recovering from surgery. I discussed the prognosis and adjuvant treatment options with her today. We reviewed the details of the surgical pathology report. The tumor has several features that may be characterized as "high-risk "including the bladder invasion, perineural invasion, and elevated preoperative CEA.  I estimate a 20-30% chance of developing recurrent colon cancer over the next several years in the absence of adjuvant therapy. We discussed the data supporting a benefit of 5-fluorouracil-based chemotherapy in this setting. We also discussed the expected benefit with the addition of oxaliplatin.  I recommend adjuvant systemic chemotherapy. We discussed single  agent capecitabine, FOLFOX, and CAPOX. I reviewed the potential for diarrhea, hand/foot syndrome, mucositis, rash, and hyperpigmentation with 5-fluorouracil and capecitabine. We discussed the various types of neuropathy associated with oxaliplatin. We discussed the potential for nausea/vomiting, alopecia, and hematologic toxicity.  Ms. Stimson will attend a chemotherapy teaching class for further discussion of the FOLFOX and CAPOX regimens. She would like to proceed with 5-fluorouracil and oxaliplatin-based therapy.  We will refer her to Dr. [name] for placement of a Port-A-Cath. She will return for an office visit and further discussion 11/15/2013.  We will check a CBC, chemistry panel, and CEA when she is here for chemotherapy teaching class 11/09/2013. She will be scheduled for a staging chest CT.  Ms. Chittum does not appear to have hereditary non-polyposis colon cancer syndrome. She understands her family members are at increased risk for developing colon cancer. She will recommend they obtain appropriate screening.  We also discussed diet and exercise maneuvers that may decrease the risk of developing colon cancer. She will need a complete colonoscopy in the next 6-12 months.  Approximately 50 minutes were spent with patient today. The majority of the time was used for counseling and coordination of care.  Ladell Pier 11/06/2013, 5:50 PM

## 2013-11-07 ENCOUNTER — Telehealth: Payer: Self-pay | Admitting: *Deleted

## 2013-11-07 ENCOUNTER — Telehealth (INDEPENDENT_AMBULATORY_CARE_PROVIDER_SITE_OTHER): Payer: Self-pay | Admitting: General Surgery

## 2013-11-07 ENCOUNTER — Encounter (HOSPITAL_COMMUNITY): Payer: Self-pay | Admitting: Pharmacy Technician

## 2013-11-07 NOTE — Telephone Encounter (Signed)
Per order from Dr. Benay Spice,  [facility] testing request sent to pathology to be performed on Access[facility]: [id number].

## 2013-11-07 NOTE — Telephone Encounter (Signed)
Pt has balance $3551.50 w/ [facility] - no insurance  Please advise

## 2013-11-09 ENCOUNTER — Encounter: Payer: Self-pay | Admitting: *Deleted

## 2013-11-09 ENCOUNTER — Encounter: Payer: Self-pay | Admitting: Oncology

## 2013-11-09 ENCOUNTER — Encounter (HOSPITAL_COMMUNITY): Payer: Self-pay

## 2013-11-09 ENCOUNTER — Other Ambulatory Visit: Payer: No Typology Code available for payment source

## 2013-11-09 ENCOUNTER — Other Ambulatory Visit (HOSPITAL_BASED_OUTPATIENT_CLINIC_OR_DEPARTMENT_OTHER): Payer: Self-pay

## 2013-11-09 DIAGNOSIS — C187 Malignant neoplasm of sigmoid colon: Secondary | ICD-10-CM

## 2013-11-09 LAB — COMPREHENSIVE METABOLIC PANEL (CC13)
ALT: 11 U/L (ref 0–55)
AST: 16 U/L (ref 5–34)
Albumin: 2.9 g/dL — ABNORMAL LOW (ref 3.5–5.0)
Alkaline Phosphatase: 92 U/L (ref 40–150)
Anion Gap: 11 mEq/L (ref 3–11)
BUN: 8.1 mg/dL (ref 7.0–26.0)
CO2: 23 mEq/L (ref 22–29)
Calcium: 9.6 mg/dL (ref 8.4–10.4)
Chloride: 108 mEq/L (ref 98–109)
Creatinine: 0.8 mg/dL (ref 0.6–1.1)
Glucose: 105 mg/dl (ref 70–140)
Potassium: 3.7 mEq/L (ref 3.5–5.1)
Sodium: 142 mEq/L (ref 136–145)
Total Bilirubin: 0.27 mg/dL (ref 0.20–1.20)
Total Protein: 7.1 g/dL (ref 6.4–8.3)

## 2013-11-09 LAB — CBC WITH DIFFERENTIAL/PLATELET
BASO%: 1.1 % (ref 0.0–2.0)
Basophils Absolute: 0.1 10*3/uL (ref 0.0–0.1)
EOS%: 2.2 % (ref 0.0–7.0)
Eosinophils Absolute: 0.2 10*3/uL (ref 0.0–0.5)
HCT: 30.1 % — ABNORMAL LOW (ref 34.8–46.6)
HGB: 9.4 g/dL — ABNORMAL LOW (ref 11.6–15.9)
LYMPH%: 15.6 % (ref 14.0–49.7)
MCH: 23.4 pg — ABNORMAL LOW (ref 25.1–34.0)
MCHC: 31.2 g/dL — ABNORMAL LOW (ref 31.5–36.0)
MCV: 75.2 fL — ABNORMAL LOW (ref 79.5–101.0)
MONO#: 0.4 10*3/uL (ref 0.1–0.9)
MONO%: 6.3 % (ref 0.0–14.0)
NEUT#: 5.1 10*3/uL (ref 1.5–6.5)
NEUT%: 74.8 % (ref 38.4–76.8)
Platelets: 394 10*3/uL (ref 145–400)
RBC: 4.01 10*6/uL (ref 3.70–5.45)
RDW: 23 % — ABNORMAL HIGH (ref 11.2–14.5)
WBC: 6.8 10*3/uL (ref 3.9–10.3)
lymph#: 1.1 10*3/uL (ref 0.9–3.3)

## 2013-11-09 NOTE — Progress Notes (Signed)
1 view chest, ekg 2/15 epic

## 2013-11-09 NOTE — Patient Instructions (Addendum)
Your procedure is scheduled on: 11/15/13  [facility]   Report to [place] at    12:30PM  Call this number if you have problems the morning of surgery: [phone]        Do not eat food:After Midnight. Tuesday NIGHT-- MAY HAVE CLEAR LIQUIDS Wednesday MORNING UNTIL 08:30 AM-- THEN NOTHING BY MOUTH   Take these medicines the morning of surgery with A SIP OF WATER: PANTOPRAZOLE MAY TAKE PERCOCET IF NEEDED   .  Contacts, dentures or partial plates, or metal hairpins  can not be worn to surgery. Your family will be responsible for glasses, dentures, hearing aides while you are in surgery  Leave suitcase in the car. After surgery it may be brought to your room.  For patients admitted to the hospital, checkout time is 11:00 AM day of  discharge.                DO NOT WEAR JEWELRY, LOTIONS, POWDERS, OR PERFUMES.  WOMEN-- DO NOT SHAVE LEGS OR UNDERARMS FOR 48 HOURS BEFORE SHOWERS. MEN MAY SHAVE FACE.  Patients discharged the day of surgery will not be allowed to drive home. IF going home the day of surgery, you must have a driver and someone to stay with you for the first 24 hours  Name and phone number of your driver:    [facility] - Preparing for Surgery Before surgery, you can play an important role.  Because skin is not sterile, your skin needs to be as free of germs as possible.  You can reduce the number of germs on your skin by washing with CHG (chlorahexidine gluconate) soap before surgery.  CHG is an antiseptic cleaner which kills germs and bonds with the skin to continue killing germs even after washing. Please DO NOT use if you have an allergy to CHG or antibacterial soaps.  If your skin becomes reddened/irritated stop using the CHG and inform your nurse when you arrive at Short Stay. Do not shave (including legs and  underarms) for at least 48 hours prior to the first CHG shower.  You may shave your face. Please follow these instructions carefully:  1.  Shower with CHG Soap the night before surgery and the  morning of Surgery.  2.  If you choose to wash your hair, wash your hair first as usual with your  normal  shampoo.  3.  After you shampoo, rinse your hair and body thoroughly to remove the  shampoo.                           4.  Use CHG as you would any  other liquid soap.  You can apply chg directly  to the skin and wash                       Gently with a scrungie or clean washcloth.  5.  Apply the CHG Soap to your body ONLY FROM THE NECK DOWN.   Do not use on open                           Wound or open sores. Avoid contact with eyes, ears mouth and genitals (private parts).                        Genitals (private parts) with your normal soap.             6.  Wash thoroughly, paying special attention to the area where your surgery  will be performed.  7.  Thoroughly rinse your body with warm water from the neck down.  8.  DO NOT shower/wash with your normal soap after using and rinsing off  the CHG Soap.                9.  Pat yourself dry with a clean towel.            10.  Wear clean pajamas.            11.  Place clean sheets on your bed the night of your first shower and do not  sleep with pets. Day of Surgery : Do not apply any lotions/deodorants the morning of surgery.  Please wear clean clothes to the hospital/surgery center.  FAILURE TO FOLLOW THESE INSTRUCTIONS MAY RESULT IN THE CANCELLATION OF YOUR SURGERY PATIENT SIGNATURE_________________________________  NURSE SIGNATURE__________________________________  ________________________________________________________________________    CLEAR LIQUID DIET   Foods Allowed                                                                     Foods Excluded  Coffee and tea, regular and decaf                             liquids that you  cannot  Plain Jell-O in any flavor                                             see through such as: Fruit ices (not with fruit pulp)                                     milk, soups, orange juice  Iced Popsicles                                    All solid food Carbonated beverages, regular and diet  Cranberry, grape and apple juices Sports drinks like Gatorade Lightly seasoned clear broth or consume(fat free) Sugar, honey syrup  Sample Menu Breakfast                                Lunch                                     Supper Cranberry juice                    Beef broth                            Chicken broth Jell-O                                     Grape juice                           Apple juice Coffee or tea                        Jell-O                                      Popsicle                                                Coffee or tea                        Coffee or tea  _____________________________________________________________________

## 2013-11-09 NOTE — Progress Notes (Signed)
5-fluorouracil and oxaliplatin are not replaceable drugs

## 2013-11-10 ENCOUNTER — Encounter (HOSPITAL_COMMUNITY): Payer: Self-pay

## 2013-11-10 ENCOUNTER — Encounter (HOSPITAL_COMMUNITY)
Admission: RE | Admit: 2013-11-10 | Discharge: 2013-11-10 | Disposition: A | Discharge status: Home / Self Care | Payer: No Typology Code available for payment source | Source: Ambulatory Visit | Attending: General Surgery | Admitting: General Surgery

## 2013-11-10 ENCOUNTER — Ambulatory Visit: Payer: No Typology Code available for payment source | Admitting: Nutrition

## 2013-11-10 ENCOUNTER — Telehealth: Payer: Self-pay | Admitting: Genetic Counselor

## 2013-11-10 DIAGNOSIS — Z01812 Encounter for preprocedural laboratory examination: Secondary | ICD-10-CM | POA: Insufficient documentation

## 2013-11-10 HISTORY — DX: Gastro-esophageal reflux disease without esophagitis: K21.9

## 2013-11-10 HISTORY — DX: Malignant (primary) neoplasm, unspecified: C80.1

## 2013-11-10 LAB — HCG, SERUM, QUALITATIVE: Preg, Serum: NEGATIVE

## 2013-11-10 LAB — CEA: CEA: 1 ng/mL (ref 0.0–5.0)

## 2013-11-10 NOTE — Progress Notes (Signed)
Cbc,cmet 11/09/13 epic,  Has indwelling suprapubic catheter.  States incision line still has two small open areas- using dry dressing- states drainage does not look like is infection

## 2013-11-10 NOTE — Progress Notes (Signed)
Patient is a 43 year old female diagnosed with colon cancer. She is a patient of Dr. Benay Spice.  Past medical history includes tobacco usage.   Medications include Protonix and florastor.  Labs were reviewed.  Height: 61 inches. Weight: 228 pounds  Usual body weight: 270 pounds January 2015. BMI: 43.1.  Patient is status post surgery and getting ready for chemotherapy.  She is positive for a 40 pound weight loss over the past 5 months.  She states her appetite is good and she is eating normally.  She occasionally has problems with constipation.  She enjoys resource breeze.  Nutrition diagnosis: Unintended weight loss related to diagnosis of colon cancer as evidenced by 16% weight loss from usual body weight over 5 months.  Intervention: Patient was educated on consuming a healthy plant-based diet with adequate protein and calories to preserve lean body mass.  Recommended patient continue resource breeze during times of inability to eat.  Discouraged continued weight loss.  Provided patient tips on dealing with constipation.  Reviewed, high-protein foods with patient.  Provided fact sheets, and my contact information.  Teach back method used.  Monitoring, evaluation, goals: Patient will tolerate a healthy, plant-based diet to preserve lean body mass and tolerate chemotherapy.  Next visit: To be scheduled with chemotherapy treatments.

## 2013-11-10 NOTE — Telephone Encounter (Signed)
CALLED PATIENT TO GAVE GENETIC APPT NOT ABLE LEAVE MESSAGE.

## 2013-11-14 ENCOUNTER — Encounter (HOSPITAL_COMMUNITY): Payer: Self-pay

## 2013-11-14 ENCOUNTER — Ambulatory Visit: Payer: No Typology Code available for payment source | Attending: Internal Medicine

## 2013-11-14 ENCOUNTER — Ambulatory Visit (HOSPITAL_COMMUNITY)
Admission: RE | Admit: 2013-11-14 | Discharge: 2013-11-14 | Disposition: A | Discharge status: Home / Self Care | Payer: No Typology Code available for payment source | Source: Ambulatory Visit | Attending: Oncology | Admitting: Oncology

## 2013-11-14 DIAGNOSIS — C187 Malignant neoplasm of sigmoid colon: Secondary | ICD-10-CM | POA: Insufficient documentation

## 2013-11-15 ENCOUNTER — Ambulatory Visit (HOSPITAL_COMMUNITY): Payer: No Typology Code available for payment source

## 2013-11-15 ENCOUNTER — Other Ambulatory Visit: Payer: Self-pay | Admitting: *Deleted

## 2013-11-15 ENCOUNTER — Encounter (HOSPITAL_COMMUNITY): Payer: Self-pay | Admitting: *Deleted

## 2013-11-15 ENCOUNTER — Encounter (HOSPITAL_COMMUNITY): Payer: MEDICAID | Admitting: Anesthesiology

## 2013-11-15 ENCOUNTER — Ambulatory Visit (HOSPITAL_COMMUNITY): Payer: No Typology Code available for payment source | Admitting: Anesthesiology

## 2013-11-15 ENCOUNTER — Ambulatory Visit (HOSPITAL_COMMUNITY)
Admission: RE | Admit: 2013-11-15 | Discharge: 2013-11-15 | Disposition: A | Discharge status: Home / Self Care | Payer: No Typology Code available for payment source | Source: Ambulatory Visit | Attending: General Surgery | Admitting: General Surgery

## 2013-11-15 ENCOUNTER — Ambulatory Visit (HOSPITAL_BASED_OUTPATIENT_CLINIC_OR_DEPARTMENT_OTHER): Payer: Self-pay | Admitting: Oncology

## 2013-11-15 ENCOUNTER — Telehealth: Payer: Self-pay | Admitting: Oncology

## 2013-11-15 ENCOUNTER — Encounter (HOSPITAL_COMMUNITY)
Admission: RE | Disposition: A | Discharge status: Home / Self Care | Payer: Self-pay | Source: Ambulatory Visit | Attending: General Surgery

## 2013-11-15 VITALS — BP 137/87 | HR 85 | Temp 98.3°F | Resp 18 | Ht 61.0 in | Wt 228.1 lb

## 2013-11-15 DIAGNOSIS — C189 Malignant neoplasm of colon, unspecified: Secondary | ICD-10-CM

## 2013-11-15 DIAGNOSIS — K219 Gastro-esophageal reflux disease without esophagitis: Secondary | ICD-10-CM | POA: Insufficient documentation

## 2013-11-15 DIAGNOSIS — D649 Anemia, unspecified: Secondary | ICD-10-CM

## 2013-11-15 DIAGNOSIS — Z87891 Personal history of nicotine dependence: Secondary | ICD-10-CM | POA: Insufficient documentation

## 2013-11-15 DIAGNOSIS — C187 Malignant neoplasm of sigmoid colon: Secondary | ICD-10-CM

## 2013-11-15 HISTORY — PX: PORTACATH PLACEMENT: SHX2246

## 2013-11-15 SURGERY — INSERTION, TUNNELED CENTRAL VENOUS DEVICE, WITH PORT
Anesthesia: General

## 2013-11-15 MED ORDER — HEPARIN SODIUM (PORCINE) 5000 UNIT/ML IJ SOLN
Freq: Once | INTRAMUSCULAR | Status: AC
  Administered 2013-11-15: 15:00:00
  Filled 2013-11-15: qty 1.2

## 2013-11-15 MED ORDER — FENTANYL CITRATE 0.05 MG/ML IJ SOLN: 25.0000 ug | INTRAMUSCULAR | Status: DC | PRN

## 2013-11-15 MED ORDER — ONDANSETRON HCL 4 MG/2ML IJ SOLN
INTRAMUSCULAR | Status: AC
  Filled 2013-11-15: qty 2

## 2013-11-15 MED ORDER — SODIUM CHLORIDE 0.9 % IR SOLN
Freq: Once | Status: DC
  Filled 2013-11-15: qty 1.2

## 2013-11-15 MED ORDER — BUPIVACAINE-EPINEPHRINE 0.25% -1:200000 IJ SOLN
INTRAMUSCULAR | Status: DC | PRN
  Administered 2013-11-15: 15 mL

## 2013-11-15 MED ORDER — BUPIVACAINE-EPINEPHRINE (PF) 0.25% -1:200000 IJ SOLN
INTRAMUSCULAR | Status: AC
  Filled 2013-11-15: qty 30

## 2013-11-15 MED ORDER — OXYCODONE-ACETAMINOPHEN 5-325 MG PO TABS: 1.0000 | ORAL_TABLET | ORAL | Status: DC | PRN

## 2013-11-15 MED ORDER — CEFAZOLIN SODIUM-DEXTROSE 2-3 GM-% IV SOLR: 2.0000 g | INTRAVENOUS | Status: DC

## 2013-11-15 MED ORDER — HEPARIN SOD (PORK) LOCK FLUSH 100 UNIT/ML IV SOLN
INTRAVENOUS | Status: AC
  Filled 2013-11-15: qty 5

## 2013-11-15 MED ORDER — LACTATED RINGERS IV SOLN
INTRAVENOUS | Status: DC | PRN
  Administered 2013-11-15: 13:00:00 via INTRAVENOUS

## 2013-11-15 MED ORDER — PROPOFOL 10 MG/ML IV BOLUS
INTRAVENOUS | Status: AC
  Filled 2013-11-15: qty 20

## 2013-11-15 MED ORDER — FENTANYL CITRATE 0.05 MG/ML IJ SOLN
INTRAMUSCULAR | Status: DC | PRN
  Administered 2013-11-15 (×4): 25 ug via INTRAVENOUS

## 2013-11-15 MED ORDER — MIDAZOLAM HCL 2 MG/2ML IJ SOLN
INTRAMUSCULAR | Status: AC
  Filled 2013-11-15: qty 2

## 2013-11-15 MED ORDER — LIDOCAINE HCL 1 % IJ SOLN
INTRAMUSCULAR | Status: AC
  Filled 2013-11-15: qty 20

## 2013-11-15 MED ORDER — LACTATED RINGERS IV SOLN: INTRAVENOUS | Status: DC

## 2013-11-15 MED ORDER — LIDOCAINE HCL 1 % IJ SOLN
INTRAMUSCULAR | Status: DC | PRN
  Administered 2013-11-15: 15 mL

## 2013-11-15 MED ORDER — LIDOCAINE HCL (CARDIAC) 20 MG/ML IV SOLN
INTRAVENOUS | Status: DC | PRN
  Administered 2013-11-15: 50 mg via INTRAVENOUS

## 2013-11-15 MED ORDER — PROPOFOL 10 MG/ML IV BOLUS
INTRAVENOUS | Status: DC | PRN
  Administered 2013-11-15: 150 mg via INTRAVENOUS

## 2013-11-15 MED ORDER — FENTANYL CITRATE 0.05 MG/ML IJ SOLN
INTRAMUSCULAR | Status: AC
  Filled 2013-11-15: qty 2

## 2013-11-15 MED ORDER — ONDANSETRON HCL 4 MG/2ML IJ SOLN
INTRAMUSCULAR | Status: DC | PRN
  Administered 2013-11-15: 4 mg via INTRAVENOUS

## 2013-11-15 MED ORDER — MIDAZOLAM HCL 5 MG/5ML IJ SOLN
INTRAMUSCULAR | Status: DC | PRN
  Administered 2013-11-15: 2 mg via INTRAVENOUS

## 2013-11-15 MED ORDER — CEFAZOLIN SODIUM-DEXTROSE 2-3 GM-% IV SOLR
INTRAVENOUS | Status: AC
  Filled 2013-11-15: qty 50

## 2013-11-15 MED ORDER — PROCHLORPERAZINE MALEATE 10 MG PO TABS: 10.0000 mg | ORAL_TABLET | Freq: Four times a day (QID) | ORAL | Status: DC | PRN

## 2013-11-15 SURGICAL SUPPLY — 36 items
ADH SKN CLS APL DERMABOND .7 (GAUZE/BANDAGES/DRESSINGS) ×1
BLADE HEX COATED 2.75 (ELECTRODE) ×2 IMPLANT
BLADE SURG 15 STRL LF DISP TIS (BLADE) ×1 IMPLANT
BLADE SURG 15 STRL SS (BLADE) ×2
BLADE SURG SZ11 CARB STEEL (BLADE) ×2 IMPLANT
CANISTER SUCTION 2500CC (MISCELLANEOUS) ×1 IMPLANT
CHLORAPREP W/TINT 26ML (MISCELLANEOUS) ×2 IMPLANT
DECANTER SPIKE VIAL GLASS SM (MISCELLANEOUS) ×2 IMPLANT
DERMABOND ADVANCED (GAUZE/BANDAGES/DRESSINGS) ×1
DERMABOND ADVANCED .7 DNX12 (GAUZE/BANDAGES/DRESSINGS) ×1 IMPLANT
DRAPE C-ARM 42X120 X-RAY (DRAPES) ×2 IMPLANT
DRAPE LAPAROTOMY TRNSV 102X78 (DRAPE) ×2 IMPLANT
DRAPE UTILITY XL STRL (DRAPES) ×2 IMPLANT
ELECT REM PT RETURN 9FT ADLT (ELECTROSURGICAL) ×2
ELECTRODE REM PT RTRN 9FT ADLT (ELECTROSURGICAL) ×1 IMPLANT
GAUZE SPONGE 4X4 16PLY XRAY LF (GAUZE/BANDAGES/DRESSINGS) ×2 IMPLANT
GLOVE BIO SURGEON STRL SZ 6 (GLOVE) ×3 IMPLANT
GLOVE BIOGEL PI IND STRL 7.0 (GLOVE) ×1 IMPLANT
GLOVE BIOGEL PI INDICATOR 7.0 (GLOVE) ×1
GLOVE INDICATOR 6.5 STRL GRN (GLOVE) ×3 IMPLANT
GOWN STRL REUS W/TWL 2XL LVL3 (GOWN DISPOSABLE) ×2 IMPLANT
GOWN STRL REUS W/TWL XL LVL3 (GOWN DISPOSABLE) ×2 IMPLANT
KIT BASIN OR (CUSTOM PROCEDURE TRAY) ×2 IMPLANT
KIT PORT POWER 8FR ISP CVUE (Catheter) ×1 IMPLANT
NEEDLE HYPO 22GX1.5 SAFETY (NEEDLE) ×2 IMPLANT
PACK BASIC VI WITH GOWN DISP (CUSTOM PROCEDURE TRAY) ×2 IMPLANT
PENCIL BUTTON HOLSTER BLD 10FT (ELECTRODE) ×2 IMPLANT
SUT MNCRL AB 4-0 PS2 18 (SUTURE) ×2 IMPLANT
SUT PROLENE 2 0 SH DA (SUTURE) ×4 IMPLANT
SUT VIC AB 3-0 SH 27 (SUTURE) ×2
SUT VIC AB 3-0 SH 27X BRD (SUTURE) ×1 IMPLANT
SYR BULB IRRIGATION 50ML (SYRINGE) IMPLANT
SYR CONTROL 10ML LL (SYRINGE) ×2 IMPLANT
TOWEL OR 17X26 10 PK STRL BLUE (TOWEL DISPOSABLE) ×2 IMPLANT
TOWEL OR NON WOVEN STRL DISP B (DISPOSABLE) ×2 IMPLANT
YANKAUER SUCT BULB TIP 10FT TU (MISCELLANEOUS) IMPLANT

## 2013-11-15 NOTE — Progress Notes (Signed)
  [place] OFFICE PROGRESS NOTE   Diagnosis: Colon cancer  INTERVAL HISTORY:   She returns as scheduled. The abdominal wound has almost completely healed. She has attended a chemotherapy teaching class. Ms. Faciane is scheduled for placement of a Port-A-Cath later today.  Objective:  Vital signs in last 24 hours:  Blood pressure 137/87, pulse 85, temperature 98.3 F (36.8 C), temperature source Oral, resp. rate 18, height 5\' 1"  (1.549 m), weight 228 lb 1.6 oz (103.465 kg), last menstrual period 10/14/2013.    Resp: Lungs clear bilaterally Cardio: Regular rate and rhythm GI: No hepatomegaly, less than 1 cm superficial opening at the lower portion of the midline incision Vascular: No leg edema   Lab Results:  Lab Results  Component Value Date   WBC 6.8 11/09/2013   HGB 9.4* 11/09/2013   HCT 30.1* 11/09/2013   MCV 75.2* 11/09/2013   PLT 394 11/09/2013   NEUTROABS 5.1 11/09/2013      Lab Results  Component Value Date   CEA 1.0 11/09/2013    Imaging:  Ct Chest Wo Contrast  11/14/2013   CLINICAL DATA:  Colon cancer diagnosed 4/15. Treatments to begin. Partial colonic resection. Ex-smoker. No current chest complaints.  EXAM: CT CHEST WITHOUT CONTRAST  TECHNIQUE: Multidetector CT imaging of the chest was performed following the standard protocol without IV contrast.  COMPARISON:  Abdominal pelvic CT of 10/17/2013. Chest radiograph of 08/07/2013. No prior chest CT.  FINDINGS: Lungs/Pleura:  No nodules or airspace opacities.  No pleural fluid.  Heart/Mediastinum: No supraclavicular adenopathy. A medial right breast nodule measures 1.9 x 2.5 cm on image 20/series 2. No axillary adenopathy. Bovine arch. Heart size upper normal, without pericardial effusion. No mediastinal or definite hilar adenopathy, given limitations of unenhanced CT.  Upper Abdomen: Normal imaged portions of the spleen. Focal steatosis adjacent the falciform ligament. Normal imaged stomach, pancreas,  gallbladder, biliary tract, adrenal glands, and kidneys.  Bones/Musculoskeletal:  No acute osseous abnormality.  IMPRESSION: 1.  No acute process or evidence of metastatic disease in the chest. 2. Medial right breast nodule of 2.5 cm. Consider correlation with diagnostic mammogram and possibly ultrasound.   Electronically Signed   By: Abigail Miyamoto M.D.   On: 11/14/2013 17:42    Medications: I have reviewed the patient's current medications.  Assessment/Plan: 1. Stage IIC (T4b, N0), adenocarcinoma the sigmoid colon, status post a partial colectomy and en bloc bladder resection 10/20/2013, negative surgical margins, no preoperative colonoscopy Mildly elevated preoperative CEA 2. Microcytic anemia-likely add deficiency anemia 3. Right breast nodule on the chest CT 11/14/2013   Disposition:  She appears well. We discussed CAPOX and FOLFOX therapy. She attended a chemotherapy teaching class and decided to receive adjuvant FOLFOX. The plan is to proceed with adjuvant FOLFOX chemotherapy beginning 11/23/2013. She will return for an office visit and cycle 2 12/07/2013.  A restaging CT of the chest 11/14/2013 showed no evidence of metastatic disease. A right breast "nodule "was found. I will address this with Ms. Portillo when she returns 12/07/2013.  Ladell Pier, MD  11/15/2013  9:16 AM

## 2013-11-15 NOTE — Anesthesia Preprocedure Evaluation (Addendum)
Anesthesia Evaluation  Patient identified by MRN, date of birth, ID band Patient awake    Reviewed: Allergy & Precautions, H&P , NPO status , Patient's Chart, lab work & pertinent test results  Airway Mallampati: II TM Distance: >3 FB Neck ROM: full    Dental no notable dental hx. (+) Teeth Intact, Dental Advisory Given   Pulmonary neg pulmonary ROS, former smoker,  breath sounds clear to auscultation  Pulmonary exam normal       Cardiovascular Exercise Tolerance: Good negative cardio ROS  Rhythm:regular Rate:Normal     Neuro/Psych negative neurological ROS  negative psych ROS   GI/Hepatic Neg liver ROS, GERD-  Medicated and Controlled,Sigmoid cancer   Endo/Other  negative endocrine ROSMorbid obesity  Renal/GU negative Renal ROS  negative genitourinary   Musculoskeletal   Abdominal (+) + obese,   Peds  Hematology negative hematology ROS (+) anemia , hgb 9.4   Anesthesia Other Findings   Reproductive/Obstetrics negative OB ROS                          Anesthesia Physical Anesthesia Plan  ASA: III  Anesthesia Plan: General   Post-op Pain Management:    Induction: Intravenous  Airway Management Planned: LMA  Additional Equipment:   Intra-op Plan:   Post-operative Plan:   Informed Consent: I have reviewed the patients History and Physical, chart, labs and discussed the procedure including the risks, benefits and alternatives for the proposed anesthesia with the patient or authorized representative who has indicated his/her understanding and acceptance.   Dental Advisory Given  Plan Discussed with: CRNA and Surgeon  Anesthesia Plan Comments:         Anesthesia Quick Evaluation

## 2013-11-15 NOTE — Op Note (Signed)
PREOPERATIVE DIAGNOSIS:  Sigmoid colon cancer     POSTOPERATIVE DIAGNOSIS:  Same     PROCEDURE: left subclavian port placement, Bard ClearVue  Power Port, MRI safe, 8-French.      SURGEON:  Stark Klein, MD      ANESTHESIA:  General   FINDINGS:  Good venous return, easy flush, and tip of the catheter and   SVC 22 cm.      SPECIMEN:  None.      ESTIMATED BLOOD LOSS:  Minimal.      COMPLICATIONS:  None known.      PROCEDURE:  Pt was identified in the holding area and taken to   the operating room, where patient was placed supine on the operating room   table.  General anesthesia was induced.  Patient's arms were tucked and the upper   chest and neck were prepped and draped in sterile fashion.  Time-out was   performed according to the surgical safety check list.  When all was   correct, we continued.   Local anesthetic was administered over this   area at the angle of the clavicle.  The vein was accessed with 1 pass of the needle. There was good venous return and the wire passed easily with no ectopy.   Fluoroscopy was used to confirm that the wire was in the vena cava.      The patient was placed back level and the area for the pocket was anethetized   with local anesthetic.  A 3-cm transverse incision was made with a #15   blade.  Cautery was used to divide the subcutaneous tissues down to the   pectoralis muscle.  An [employer] retractor was used to elevate the skin   while a pocket was created on top of the pectoralis fascia.  The port   was placed into the pocket to confirm that it was of adequate size.  The   catheter was preattached to the port.  The port was then secured to the   pectoralis fascia with four 2-0 Prolene sutures.  These were clamped and   not tied down yet.    The catheter was tunneled through to the wire exit   site.  The catheter was placed along the wire to determine what length it should be to be in the SVC.  The catheter was cut at 22 cm.  The tunneler  sheath and dilator were passed over the wire and the dilator and wire were removed.  The catheter was advanced through the tunneler sheath and the tunneler sheath was pulled away.  Care was taken to keep the catheter in the tunneler sheath as this occurred. This was advanced and the tunneler sheath was removed.  There was good venous   return and easy flush of the catheter.  The Prolene sutures were tied   down to the pectoral fascia.  The skin was reapproximated using 3-0   Vicryl interrupted deep dermal sutures.    Fluoroscopy was used to re-confirm good position of the catheter.  The skin   was then closed using 4-0 Monocryl in a subcuticular fashion.  The port was flushed with concentrated heparin flush as well.  The wounds were then cleaned, dried, and dressed with Dermabond.  The patient was awakened from anesthesia and taken to the PACU in stable condition.  Needle, sponge, and instrument counts were correct.               Stark Klein, MD

## 2013-11-15 NOTE — H&P (View-Only) (Signed)
HISTORY: Pt is around 3 weeks s/p sigmoid resection and en bloc bladder resection for colon cancer fistulizing to bladder.  She is doing well.  She is eating OK.  She is not having fever/ chills.  She is having BMs.      EXAM: General:  Alert and oriented.   Incision:  Healing well.  Some granulation tissue that is cauterized.     PATHOLOGY: Diagnosis Colon, segmental resection for tumor, with bladder dome and sigmoid colon - COLORECTAL ADENOCARCINOMA EXTENDING INTO PERICOLONIC CONNECTIVE TISSUE AND INTO ATTACHED URINARY BLADDER WALL. - MARGINS NOT INVOLVED. - NINE BENIGN LYMPH NODES (0/9). LN upgraded to 15 negative nodes.     ASSESSMENT AND PLAN:   Cancer of sigmoid colon, invading bladder, T4N0, s/p en bloc resection with Dr. Herrick 4/24 Patient has appointment today with Dr. Sherrill. She will likely need referral to genetics because of her age.  If she needs IV chemotherapy, we will place a Port-A-Cath. I briefly discussed this.  She is advised to a dry dressing on her wound if it is draining. Otherwise, she can leave it open to air. She is advised to maintain lifting restrictions. She has an appointment with Dr. Herrick for potential Foley removal later this month.  I will see her back in 3 months.        Tydarius Yawn L Heidi Maclin, MD Surgical Oncology, General & Endocrine Surgery [facility]  KINDL,JAMES DOUGLAS, MD No ref. provider found    

## 2013-11-15 NOTE — Anesthesia Postprocedure Evaluation (Signed)
  Anesthesia Post-op Note  Patient: Amy Bentley  Procedure(s) Performed: Procedure(s) (LRB): INSERTION PORT-A-CATH (N/A)  Patient Location: PACU  Anesthesia Type: General  Level of Consciousness: awake and alert   Airway and Oxygen Therapy: Patient Spontanous Breathing  Post-op Pain: mild  Post-op Assessment: Post-op Vital signs reviewed, Patient's Cardiovascular Status Stable, Respiratory Function Stable, Patent Airway and No signs of Nausea or vomiting  Last Vitals:  Filed Vitals:   11/15/13 1222  BP: 145/91  Pulse: 85  Temp: 36.8 C  Resp: 18    Post-op Vital Signs: stable   Complications: No apparent anesthesia complications

## 2013-11-15 NOTE — Transfer of Care (Signed)
Immediate Anesthesia Transfer of Care Note  Patient: Amy Bentley  Procedure(s) Performed: Procedure(s): INSERTION PORT-A-CATH (N/A)  Patient Location: PACU  Anesthesia Type:General  Level of Consciousness: awake, alert  and oriented  Airway & Oxygen Therapy: Patient Spontanous Breathing and Patient connected to face mask oxygen  Post-op Assessment: Report given to PACU RN and Post -op Vital signs reviewed and stable  Post vital signs: Reviewed and stable  Complications: No apparent anesthesia complications

## 2013-11-15 NOTE — [facility] Oncology Navigator Note (Signed)
Met with patient to assess for needs.  Reviewed chemo meds and answered questions,  She inquired about FMLA and paperwork.  She will bring papers to next visit for MD to sign.  She did not have any questions about her schedule.  She understands to call for questions or barriers to care.  Will continue to follow as needed.

## 2013-11-15 NOTE — Discharge Instructions (Addendum)
[facility] °Office Phone Number [phone] ° ° POST OP INSTRUCTIONS ° °Always review your discharge instruction sheet given to you by the facility where your surgery was performed. ° °IF YOU HAVE DISABILITY OR FAMILY LEAVE FORMS, YOU MUST BRING THEM TO THE OFFICE FOR PROCESSING.  DO NOT GIVE THEM TO YOUR DOCTOR. ° °1. A prescription for pain medication may be given to you upon discharge.  Take your pain medication as prescribed, if needed.  If narcotic pain medicine is not needed, then you may take acetaminophen (Tylenol) or ibuprofen (Advil) as needed. °2. Take your usually prescribed medications unless otherwise directed °3. If you need a refill on your pain medication, please contact your pharmacy.  They will contact our office to request authorization.  Prescriptions will not be filled after 5pm or on week-ends. °4. You should eat very light the first 24 hours after surgery, such as soup, crackers, pudding, etc.  Resume your normal diet the day after surgery °5. It is common to experience some constipation if taking pain medication after surgery.  Increasing fluid intake and taking a stool softener will usually help or prevent this problem from occurring.  A mild laxative (Milk of Magnesia or Miralax) should be taken according to package directions if there are no bowel movements after 48 hours. °6. You may shower in 48 hours.  The surgical glue will flake off in 2-3 weeks.   °7. ACTIVITIES:  No strenuous activity or heavy lifting for 1 week.   °a. You may drive when you no longer are taking prescription pain medication, you can comfortably wear a seatbelt, and you can safely maneuver your car and apply brakes. °b. RETURN TO WORK:  __________to be determined._______________ °You should see your doctor in the office for a follow-up appointment approximately three-four weeks after your surgery.   ° °WHEN TO CALL YOUR DOCTOR: °1. Fever over 101.0 °2. Nausea and/or vomiting. °3. Extreme swelling  or bruising. °4. Continued bleeding from incision. °5. Increased pain, redness, or drainage from the incision. ° °The clinic staff is available to answer your questions during regular business hours.  Please don’t hesitate to call and ask to speak to one of the nurses for clinical concerns.  If you have a medical emergency, go to the nearest emergency room or call 911.  A surgeon from [facility] is always on call at the hospital. ° °For further questions, please visit [email or web address]  ° °

## 2013-11-15 NOTE — Telephone Encounter (Signed)
gv adn printed appt sceed and avs for pt for May adn June....sed added tx.

## 2013-11-15 NOTE — Interval H&P Note (Signed)
History and Physical Interval Note:  11/15/2013 2:10 PM  Amy Bentley  has presented today for surgery, with the diagnosis of colon cancer   The various methods of treatment have been discussed with the patient and family. After consideration of risks, benefits and other options for treatment, the patient has consented to  Procedure(s): INSERTION PORT-A-CATH (N/A) as a surgical intervention .  The patient's history has been reviewed, patient examined, no change in status, stable for surgery.  I have reviewed the patient's chart and labs.  Questions were answered to the patient's satisfaction.     Stark Klein

## 2013-11-16 ENCOUNTER — Encounter (HOSPITAL_COMMUNITY): Payer: Self-pay | Admitting: General Surgery

## 2013-11-21 ENCOUNTER — Other Ambulatory Visit: Payer: Self-pay | Admitting: *Deleted

## 2013-11-21 MED ORDER — LIDOCAINE-PRILOCAINE 2.5-2.5 % EX CREA: TOPICAL_CREAM | CUTANEOUS | Status: DC

## 2013-11-22 ENCOUNTER — Telehealth: Payer: Self-pay | Admitting: Genetic Counselor

## 2013-11-22 NOTE — Telephone Encounter (Signed)
LEFT MESSAGE FOR PATIENT TO RETURN CALL TO SCHEDULE GENETIC APPT.  °

## 2013-11-23 ENCOUNTER — Other Ambulatory Visit: Payer: Self-pay | Admitting: Oncology

## 2013-11-23 ENCOUNTER — Ambulatory Visit (HOSPITAL_BASED_OUTPATIENT_CLINIC_OR_DEPARTMENT_OTHER): Payer: Self-pay

## 2013-11-23 VITALS — BP 130/75 | HR 82 | Temp 98.8°F

## 2013-11-23 DIAGNOSIS — Z5111 Encounter for antineoplastic chemotherapy: Secondary | ICD-10-CM

## 2013-11-23 DIAGNOSIS — C187 Malignant neoplasm of sigmoid colon: Secondary | ICD-10-CM

## 2013-11-23 MED ORDER — ONDANSETRON 8 MG/50ML IVPB (CHCC)
8.0000 mg | Freq: Once | INTRAVENOUS | Status: AC
  Administered 2013-11-23: 8 mg via INTRAVENOUS

## 2013-11-23 MED ORDER — OXALIPLATIN CHEMO INJECTION 100 MG/20ML
85.0000 mg/m2 | Freq: Once | INTRAVENOUS | Status: AC
  Administered 2013-11-23: 180 mg via INTRAVENOUS
  Filled 2013-11-23: qty 36

## 2013-11-23 MED ORDER — DEXAMETHASONE SODIUM PHOSPHATE 10 MG/ML IJ SOLN
10.0000 mg | Freq: Once | INTRAMUSCULAR | Status: AC
  Administered 2013-11-23: 10 mg via INTRAVENOUS

## 2013-11-23 MED ORDER — DEXTROSE 5 % IV SOLN
Freq: Once | INTRAVENOUS | Status: AC
  Administered 2013-11-23: 13:00:00 via INTRAVENOUS

## 2013-11-23 MED ORDER — LEUCOVORIN CALCIUM INJECTION 350 MG
401.0000 mg/m2 | Freq: Once | INTRAVENOUS | Status: AC
  Administered 2013-11-23: 850 mg via INTRAVENOUS
  Filled 2013-11-23: qty 42.5

## 2013-11-23 MED ORDER — ONDANSETRON 8 MG/NS 50 ML IVPB
INTRAVENOUS | Status: AC
  Filled 2013-11-23: qty 8

## 2013-11-23 MED ORDER — FLUOROURACIL CHEMO INJECTION 2.5 GM/50ML
400.0000 mg/m2 | Freq: Once | INTRAVENOUS | Status: AC
  Administered 2013-11-23: 850 mg via INTRAVENOUS
  Filled 2013-11-23: qty 17

## 2013-11-23 MED ORDER — SODIUM CHLORIDE 0.9 % IV SOLN
2400.0000 mg/m2 | INTRAVENOUS | Status: DC
  Administered 2013-11-23: 5000 mg via INTRAVENOUS
  Filled 2013-11-23: qty 100

## 2013-11-23 MED ORDER — DEXAMETHASONE SODIUM PHOSPHATE 10 MG/ML IJ SOLN
INTRAMUSCULAR | Status: AC
  Filled 2013-11-23: qty 1

## 2013-11-23 NOTE — Progress Notes (Signed)
Patient complains her stomach feels "different" denies nausea or vomiting, no SOB. Dr. Benay Spice notified. Hold patient for 30 minutes and reassess.  1559 Patient states her stomach feels better. Patient discharged home ambulatory. Patient instructed and verbalized understanding she is to call our office with any further questions or concerns.

## 2013-11-23 NOTE — Patient Instructions (Signed)
[facility] Discharge Instructions for Patients Receiving Chemotherapy  Today you received the following chemotherapy agents 5 FU/Oxailplatin/Leucovorin To help prevent nausea and vomiting after your treatment, we encourage you to take your nausea medication as prescribed.  If you develop nausea and vomiting that is not controlled by your nausea medication, call the clinic.   BELOW ARE SYMPTOMS THAT SHOULD BE REPORTED IMMEDIATELY:  *FEVER GREATER THAN 100.5 F  *CHILLS WITH OR WITHOUT FEVER  NAUSEA AND VOMITING THAT IS NOT CONTROLLED WITH YOUR NAUSEA MEDICATION  *UNUSUAL SHORTNESS OF BREATH  *UNUSUAL BRUISING OR BLEEDING  TENDERNESS IN MOUTH AND THROAT WITH OR WITHOUT PRESENCE OF ULCERS  *URINARY PROBLEMS  *BOWEL PROBLEMS  UNUSUAL RASH Items with * indicate a potential emergency and should be followed up as soon as possible.  Feel free to call the clinic you have any questions or concerns. The clinic phone number is [phone].     Fluorouracil, 5-FU injection What is this medicine? FLUOROURACIL, 5-FU (flure oh YOOR a sil) is a chemotherapy drug. It slows the growth of cancer cells. This medicine is used to treat many types of cancer like breast cancer, colon or rectal cancer, pancreatic cancer, and stomach cancer. This medicine may be used for other purposes; ask your health care provider or pharmacist if you have questions. COMMON BRAND NAME(S): Adrucil What should I tell my health care provider before I take this medicine? They need to know if you have any of these conditions: -blood disorders -dihydropyrimidine dehydrogenase (DPD) deficiency -infection (especially a virus infection such as chickenpox, cold sores, or herpes) -kidney disease -liver disease -malnourished, poor nutrition -recent or ongoing radiation therapy -an unusual or allergic reaction to fluorouracil, other chemotherapy, other medicines, foods, dyes, or preservatives -pregnant or  trying to get pregnant -breast-feeding How should I use this medicine? This drug is given as an infusion or injection into a vein. It is administered in a hospital or clinic by a specially trained health care professional. Talk to your pediatrician regarding the use of this medicine in children. Special care may be needed. Overdosage: If you think you have taken too much of this medicine contact a poison control center or emergency room at once. NOTE: This medicine is only for you. Do not share this medicine with others. What if I miss a dose? It is important not to miss your dose. Call your doctor or health care professional if you are unable to keep an appointment. What may interact with this medicine? -allopurinol -cimetidine -dapsone -digoxin -hydroxyurea -leucovorin -levamisole -medicines for seizures like ethotoin, fosphenytoin, phenytoin -medicines to increase blood counts like filgrastim, pegfilgrastim, sargramostim -medicines that treat or prevent blood clots like warfarin, enoxaparin, and dalteparin -methotrexate -metronidazole -pyrimethamine -some other chemotherapy drugs like busulfan, cisplatin, estramustine, vinblastine -trimethoprim -trimetrexate -vaccines Talk to your doctor or health care professional before taking any of these medicines: -acetaminophen -aspirin -ibuprofen -ketoprofen -naproxen This list may not describe all possible interactions. Give your health care provider a list of all the medicines, herbs, non-prescription drugs, or dietary supplements you use. Also tell them if you smoke, drink alcohol, or use illegal drugs. Some items may interact with your medicine. What should I watch for while using this medicine? Visit your doctor for checks on your progress. This drug may make you feel generally unwell. This is not uncommon, as chemotherapy can affect healthy cells as well as cancer cells. Report any side effects. Continue your course of treatment  even though you feel ill  unless your doctor tells you to stop. In some cases, you may be given additional medicines to help with side effects. Follow all directions for their use. Call your doctor or health care professional for advice if you get a fever, chills or sore throat, or other symptoms of a cold or flu. Do not treat yourself. This drug decreases your body's ability to fight infections. Try to avoid being around people who are sick. This medicine may increase your risk to bruise or bleed. Call your doctor or health care professional if you notice any unusual bleeding. Be careful brushing and flossing your teeth or using a toothpick because you may get an infection or bleed more easily. If you have any dental work done, tell your dentist you are receiving this medicine. Avoid taking products that contain aspirin, acetaminophen, ibuprofen, naproxen, or ketoprofen unless instructed by your doctor. These medicines may hide a fever. Do not become pregnant while taking this medicine. Women should inform their doctor if they wish to become pregnant or think they might be pregnant. There is a potential for serious side effects to an unborn child. Talk to your health care professional or pharmacist for more information. Do not breast-feed an infant while taking this medicine. Men should inform their doctor if they wish to father a child. This medicine may lower sperm counts. Do not treat diarrhea with over the counter products. Contact your doctor if you have diarrhea that lasts more than 2 days or if it is severe and watery. This medicine can make you more sensitive to the sun. Keep out of the sun. If you cannot avoid being in the sun, wear protective clothing and use sunscreen. Do not use sun lamps or tanning beds/booths. What side effects may I notice from receiving this medicine? Side effects that you should report to your doctor or health care professional as soon as possible: -allergic reactions  like skin rash, itching or hives, swelling of the face, lips, or tongue -low blood counts - this medicine may decrease the number of white blood cells, red blood cells and platelets. You may be at increased risk for infections and bleeding. -signs of infection - fever or chills, cough, sore throat, pain or difficulty passing urine -signs of decreased platelets or bleeding - bruising, pinpoint red spots on the skin, black, tarry stools, blood in the urine -signs of decreased red blood cells - unusually weak or tired, fainting spells, lightheadedness -breathing problems -changes in vision -chest pain -mouth sores -nausea and vomiting -pain, swelling, redness at site where injected -pain, tingling, numbness in the hands or feet -redness, swelling, or sores on hands or feet -stomach pain -unusual bleeding Side effects that usually do not require medical attention (report to your doctor or health care professional if they continue or are bothersome): -changes in finger or toe nails -diarrhea -dry or itchy skin -hair loss -headache -loss of appetite -sensitivity of eyes to the light -stomach upset -unusually teary eyes This list may not describe all possible side effects. Call your doctor for medical advice about side effects. You may report side effects to FDA at 1-800-FDA-1088. Where should I keep my medicine? This drug is given in a hospital or clinic and will not be stored at home. NOTE: This sheet is a summary. It may not cover all possible information. If you have questions about this medicine, talk to your doctor, pharmacist, or health care provider.  2014, Elsevier/Gold Standard. (2007-10-19 13:53:16)    Leucovorin injection What is  this medicine? LEUCOVORIN (loo koe VOR in) is used to prevent or treat the harmful effects of some medicines. This medicine is used to treat anemia caused by a low amount of folic acid in the body. It is also used with 5-fluorouracil (5-FU) to treat  colon cancer. This medicine may be used for other purposes; ask your health care provider or pharmacist if you have questions. What should I tell my health care provider before I take this medicine? They need to know if you have any of these conditions: -anemia from low levels of vitamin B-12 in the blood -an unusual or allergic reaction to leucovorin, folic acid, other medicines, foods, dyes, or preservatives -pregnant or trying to get pregnant -breast-feeding How should I use this medicine? This medicine is for injection into a muscle or into a vein. It is given by a health care professional in a hospital or clinic setting. Talk to your pediatrician regarding the use of this medicine in children. Special care may be needed. Overdosage: If you think you have taken too much of this medicine contact a poison control center or emergency room at once. NOTE: This medicine is only for you. Do not share this medicine with others. What if I miss a dose? This does not apply. What may interact with this medicine? -capecitabine -fluorouracil -phenobarbital -phenytoin -primidone -trimethoprim-sulfamethoxazole This list may not describe all possible interactions. Give your health care provider a list of all the medicines, herbs, non-prescription drugs, or dietary supplements you use. Also tell them if you smoke, drink alcohol, or use illegal drugs. Some items may interact with your medicine. What should I watch for while using this medicine? Your condition will be monitored carefully while you are receiving this medicine. This medicine may increase the side effects of 5-fluorouracil, 5-FU. Tell your doctor or health care professional if you have diarrhea or mouth sores that do not get better or that get worse. What side effects may I notice from receiving this medicine? Side effects that you should report to your doctor or health care professional as soon as possible: -allergic reactions like skin  rash, itching or hives, swelling of the face, lips, or tongue -breathing problems -fever, infection -mouth sores -unusual bleeding or bruising -unusually weak or tired Side effects that usually do not require medical attention (report to your doctor or health care professional if they continue or are bothersome): -constipation or diarrhea -loss of appetite -nausea, vomiting This list may not describe all possible side effects. Call your doctor for medical advice about side effects. You may report side effects to FDA at 1-800-FDA-1088. Where should I keep my medicine? This drug is given in a hospital or clinic and will not be stored at home. NOTE: This sheet is a summary. It may not cover all possible information. If you have questions about this medicine, talk to your doctor, pharmacist, or health care provider.  2014, Elsevier/Gold Standard. (2007-12-20 16:50:29)  Oxaliplatin Injection What is this medicine? OXALIPLATIN (ox AL i PLA tin) is a chemotherapy drug. It targets fast dividing cells, like cancer cells, and causes these cells to die. This medicine is used to treat cancers of the colon and rectum, and many other cancers. This medicine may be used for other purposes; ask your health care provider or pharmacist if you have questions. COMMON BRAND NAME(S): Eloxatin What should I tell my health care provider before I take this medicine? They need to know if you have any of these conditions: -kidney disease -an  unusual or allergic reaction to oxaliplatin, other chemotherapy, other medicines, foods, dyes, or preservatives -pregnant or trying to get pregnant -breast-feeding How should I use this medicine? This drug is given as an infusion into a vein. It is administered in a hospital or clinic by a specially trained health care professional. Talk to your pediatrician regarding the use of this medicine in children. Special care may be needed. Overdosage: If you think you have taken too  much of this medicine contact a poison control center or emergency room at once. NOTE: This medicine is only for you. Do not share this medicine with others. What if I miss a dose? It is important not to miss a dose. Call your doctor or health care professional if you are unable to keep an appointment. What may interact with this medicine? -medicines to increase blood counts like filgrastim, pegfilgrastim, sargramostim -probenecid -some antibiotics like amikacin, gentamicin, neomycin, polymyxin B, streptomycin, tobramycin -zalcitabine Talk to your doctor or health care professional before taking any of these medicines: -acetaminophen -aspirin -ibuprofen -ketoprofen -naproxen This list may not describe all possible interactions. Give your health care provider a list of all the medicines, herbs, non-prescription drugs, or dietary supplements you use. Also tell them if you smoke, drink alcohol, or use illegal drugs. Some items may interact with your medicine. What should I watch for while using this medicine? Your condition will be monitored carefully while you are receiving this medicine. You will need important blood work done while you are taking this medicine. This medicine can make you more sensitive to cold. Do not drink cold drinks or use ice. Cover exposed skin before coming in contact with cold temperatures or cold objects. When out in cold weather wear warm clothing and cover your mouth and nose to warm the air that goes into your lungs. Tell your doctor if you get sensitive to the cold. This drug may make you feel generally unwell. This is not uncommon, as chemotherapy can affect healthy cells as well as cancer cells. Report any side effects. Continue your course of treatment even though you feel ill unless your doctor tells you to stop. In some cases, you may be given additional medicines to help with side effects. Follow all directions for their use. Call your doctor or health care  professional for advice if you get a fever, chills or sore throat, or other symptoms of a cold or flu. Do not treat yourself. This drug decreases your body's ability to fight infections. Try to avoid being around people who are sick. This medicine may increase your risk to bruise or bleed. Call your doctor or health care professional if you notice any unusual bleeding. Be careful brushing and flossing your teeth or using a toothpick because you may get an infection or bleed more easily. If you have any dental work done, tell your dentist you are receiving this medicine. Avoid taking products that contain aspirin, acetaminophen, ibuprofen, naproxen, or ketoprofen unless instructed by your doctor. These medicines may hide a fever. Do not become pregnant while taking this medicine. Women should inform their doctor if they wish to become pregnant or think they might be pregnant. There is a potential for serious side effects to an unborn child. Talk to your health care professional or pharmacist for more information. Do not breast-feed an infant while taking this medicine. Call your doctor or health care professional if you get diarrhea. Do not treat yourself. What side effects may I notice from receiving this medicine?  Side effects that you should report to your doctor or health care professional as soon as possible: -allergic reactions like skin rash, itching or hives, swelling of the face, lips, or tongue -low blood counts - This drug may decrease the number of white blood cells, red blood cells and platelets. You may be at increased risk for infections and bleeding. -signs of infection - fever or chills, cough, sore throat, pain or difficulty passing urine -signs of decreased platelets or bleeding - bruising, pinpoint red spots on the skin, black, tarry stools, nosebleeds -signs of decreased red blood cells - unusually weak or tired, fainting spells, lightheadedness -breathing problems -chest pain,  pressure -cough -diarrhea -jaw tightness -mouth sores -nausea and vomiting -pain, swelling, redness or irritation at the injection site -pain, tingling, numbness in the hands or feet -problems with balance, talking, walking -redness, blistering, peeling or loosening of the skin, including inside the mouth -trouble passing urine or change in the amount of urine Side effects that usually do not require medical attention (report to your doctor or health care professional if they continue or are bothersome): -changes in vision -constipation -hair loss -loss of appetite -metallic taste in the mouth or changes in taste -stomach pain This list may not describe all possible side effects. Call your doctor for medical advice about side effects. You may report side effects to FDA at 1-800-FDA-1088. Where should I keep my medicine? This drug is given in a hospital or clinic and will not be stored at home. NOTE: This sheet is a summary. It may not cover all possible information. If you have questions about this medicine, talk to your doctor, pharmacist, or health care provider.  2014, Elsevier/Gold Standard. (2008-01-10 17:22:47)

## 2013-11-25 ENCOUNTER — Ambulatory Visit (HOSPITAL_BASED_OUTPATIENT_CLINIC_OR_DEPARTMENT_OTHER): Payer: Self-pay

## 2013-11-25 VITALS — BP 137/77 | HR 72 | Temp 98.7°F | Resp 18

## 2013-11-25 DIAGNOSIS — Z452 Encounter for adjustment and management of vascular access device: Secondary | ICD-10-CM

## 2013-11-25 DIAGNOSIS — C187 Malignant neoplasm of sigmoid colon: Secondary | ICD-10-CM

## 2013-11-25 MED ORDER — SODIUM CHLORIDE 0.9 % IJ SOLN
10.0000 mL | INTRAMUSCULAR | Status: DC | PRN
  Administered 2013-11-25: 10 mL
  Filled 2013-11-25: qty 10

## 2013-11-25 MED ORDER — HEPARIN SOD (PORK) LOCK FLUSH 100 UNIT/ML IV SOLN
500.0000 [IU] | Freq: Once | INTRAVENOUS | Status: AC | PRN
  Administered 2013-11-25: 500 [IU]
  Filled 2013-11-25: qty 5

## 2013-11-25 NOTE — Patient Instructions (Addendum)
Pt. only had PUMP D/C AND VAD on Saturday May 30th per [facility].

## 2013-11-25 NOTE — Progress Notes (Signed)
Pump bolus 4.7 ml

## 2013-11-27 ENCOUNTER — Telehealth: Payer: Self-pay | Admitting: *Deleted

## 2013-11-27 NOTE — Telephone Encounter (Signed)
Spoke with patient.  She received her first chemo FOLFOX on 11/23/13 and came in on 5/30 to have her pump d/c'd.  Overall she is doing well.  She has had no vomiting, just a small amt. Of nausea - which she is using her nausea medicine for and it is helping.  She has had no diarrhea - only some constipation which she is using miralax for.  Reviewed with her that the 71fu can cause diarrhea, so be careful and monitor this closely.  She has no mouth ulcers.  She is just a little tired.  Let her know to call us for any problems or questions.

## 2013-11-30 ENCOUNTER — Telehealth: Payer: Self-pay | Admitting: *Deleted

## 2013-11-30 NOTE — Telephone Encounter (Signed)
Call from pt reporting daily headaches for the past 4 mornings. Also having nausea in the mornings that is relieved with Compazine. Pt has tried Oxycodone, Tylenol and Ibuprofen for headaches. All seem to dull the pain but does not make it go away. Pt is hesitant to take #2 Oxycodone tablets at once. Suggested she try taking Oxycodone and Ibuprofen together today. She voiced understanding.  Reviewed with Dr. Benay Spice: Call PCP for headaches, not likely related to chemo. Left message informing pt.

## 2013-12-03 ENCOUNTER — Other Ambulatory Visit: Payer: Self-pay | Admitting: Oncology

## 2013-12-07 ENCOUNTER — Ambulatory Visit (HOSPITAL_BASED_OUTPATIENT_CLINIC_OR_DEPARTMENT_OTHER): Payer: Self-pay

## 2013-12-07 ENCOUNTER — Other Ambulatory Visit: Payer: Self-pay | Admitting: Nurse Practitioner

## 2013-12-07 ENCOUNTER — Other Ambulatory Visit (HOSPITAL_BASED_OUTPATIENT_CLINIC_OR_DEPARTMENT_OTHER): Payer: Self-pay

## 2013-12-07 ENCOUNTER — Telehealth: Payer: Self-pay | Admitting: Oncology

## 2013-12-07 ENCOUNTER — Ambulatory Visit (HOSPITAL_BASED_OUTPATIENT_CLINIC_OR_DEPARTMENT_OTHER): Payer: Self-pay | Admitting: Nurse Practitioner

## 2013-12-07 VITALS — BP 129/90 | HR 60 | Temp 98.2°F | Resp 18 | Ht 61.0 in | Wt 230.4 lb

## 2013-12-07 DIAGNOSIS — C187 Malignant neoplasm of sigmoid colon: Secondary | ICD-10-CM

## 2013-12-07 DIAGNOSIS — D509 Iron deficiency anemia, unspecified: Secondary | ICD-10-CM

## 2013-12-07 DIAGNOSIS — N63 Unspecified lump in unspecified breast: Secondary | ICD-10-CM

## 2013-12-07 DIAGNOSIS — Z5111 Encounter for antineoplastic chemotherapy: Secondary | ICD-10-CM

## 2013-12-07 DIAGNOSIS — R928 Other abnormal and inconclusive findings on diagnostic imaging of breast: Secondary | ICD-10-CM

## 2013-12-07 DIAGNOSIS — R110 Nausea: Secondary | ICD-10-CM

## 2013-12-07 LAB — CBC WITH DIFFERENTIAL/PLATELET
BASO%: 0.9 % (ref 0.0–2.0)
Basophils Absolute: 0 10*3/uL (ref 0.0–0.1)
EOS%: 1.9 % (ref 0.0–7.0)
Eosinophils Absolute: 0.1 10*3/uL (ref 0.0–0.5)
HCT: 28.2 % — ABNORMAL LOW (ref 34.8–46.6)
HGB: 8.8 g/dL — ABNORMAL LOW (ref 11.6–15.9)
LYMPH%: 24.6 % (ref 14.0–49.7)
MCH: 22.3 pg — ABNORMAL LOW (ref 25.1–34.0)
MCHC: 31.2 g/dL — ABNORMAL LOW (ref 31.5–36.0)
MCV: 71.4 fL — ABNORMAL LOW (ref 79.5–101.0)
MONO#: 0.5 10*3/uL (ref 0.1–0.9)
MONO%: 10.3 % (ref 0.0–14.0)
NEUT#: 2.9 10*3/uL (ref 1.5–6.5)
NEUT%: 62.3 % (ref 38.4–76.8)
Platelets: 207 10*3/uL (ref 145–400)
RBC: 3.95 10*6/uL (ref 3.70–5.45)
RDW: 20.7 % — ABNORMAL HIGH (ref 11.2–14.5)
WBC: 4.6 10*3/uL (ref 3.9–10.3)
lymph#: 1.1 10*3/uL (ref 0.9–3.3)

## 2013-12-07 LAB — COMPREHENSIVE METABOLIC PANEL (CC13)
ALT: 9 U/L (ref 0–55)
AST: 14 U/L (ref 5–34)
Albumin: 3.5 g/dL (ref 3.5–5.0)
Alkaline Phosphatase: 113 U/L (ref 40–150)
Anion Gap: 6 mEq/L (ref 3–11)
BUN: 13.6 mg/dL (ref 7.0–26.0)
CO2: 25 mEq/L (ref 22–29)
Calcium: 9 mg/dL (ref 8.4–10.4)
Chloride: 110 mEq/L — ABNORMAL HIGH (ref 98–109)
Creatinine: 0.8 mg/dL (ref 0.6–1.1)
Glucose: 98 mg/dl (ref 70–140)
Potassium: 4.1 mEq/L (ref 3.5–5.1)
Sodium: 141 mEq/L (ref 136–145)
Total Bilirubin: 0.24 mg/dL (ref 0.20–1.20)
Total Protein: 6.9 g/dL (ref 6.4–8.3)

## 2013-12-07 MED ORDER — PALONOSETRON HCL INJECTION 0.25 MG/5ML
0.2500 mg | Freq: Once | INTRAVENOUS | Status: AC
  Administered 2013-12-07: 0.25 mg via INTRAVENOUS

## 2013-12-07 MED ORDER — ONDANSETRON 8 MG/NS 50 ML IVPB
INTRAVENOUS | Status: AC
  Filled 2013-12-07: qty 8

## 2013-12-07 MED ORDER — FLUOROURACIL CHEMO INJECTION 2.5 GM/50ML
400.0000 mg/m2 | Freq: Once | INTRAVENOUS | Status: AC
  Administered 2013-12-07: 850 mg via INTRAVENOUS
  Filled 2013-12-07: qty 17

## 2013-12-07 MED ORDER — LEUCOVORIN CALCIUM INJECTION 350 MG
401.0000 mg/m2 | Freq: Once | INTRAVENOUS | Status: AC
  Administered 2013-12-07: 850 mg via INTRAVENOUS
  Filled 2013-12-07: qty 42.5

## 2013-12-07 MED ORDER — SODIUM CHLORIDE 0.9 % IV SOLN
150.0000 mg | Freq: Once | INTRAVENOUS | Status: AC
  Administered 2013-12-07: 150 mg via INTRAVENOUS
  Filled 2013-12-07: qty 5

## 2013-12-07 MED ORDER — PALONOSETRON HCL INJECTION 0.25 MG/5ML
INTRAVENOUS | Status: AC
  Filled 2013-12-07: qty 5

## 2013-12-07 MED ORDER — DEXAMETHASONE SODIUM PHOSPHATE 10 MG/ML IJ SOLN
10.0000 mg | Freq: Once | INTRAMUSCULAR | Status: AC
  Administered 2013-12-07: 10 mg via INTRAVENOUS

## 2013-12-07 MED ORDER — DEXTROSE 5 % IV SOLN
Freq: Once | INTRAVENOUS | Status: AC
  Administered 2013-12-07: 13:00:00 via INTRAVENOUS

## 2013-12-07 MED ORDER — DEXAMETHASONE SODIUM PHOSPHATE 10 MG/ML IJ SOLN
INTRAMUSCULAR | Status: AC
  Filled 2013-12-07: qty 1

## 2013-12-07 MED ORDER — OXALIPLATIN CHEMO INJECTION 100 MG/20ML
85.0000 mg/m2 | Freq: Once | INTRAVENOUS | Status: AC
  Administered 2013-12-07: 180 mg via INTRAVENOUS
  Filled 2013-12-07: qty 36

## 2013-12-07 MED ORDER — SODIUM CHLORIDE 0.9 % IV SOLN
2400.0000 mg/m2 | INTRAVENOUS | Status: DC
  Administered 2013-12-07: 5000 mg via INTRAVENOUS
  Filled 2013-12-07: qty 100

## 2013-12-07 MED ORDER — IBUPROFEN 600 MG PO TABS: 600.0000 mg | ORAL_TABLET | Freq: Three times a day (TID) | ORAL | Status: DC | PRN

## 2013-12-07 NOTE — Patient Instructions (Signed)
Begin ferrous sulfate 325 mg three times a day 

## 2013-12-07 NOTE — Telephone Encounter (Signed)
gv and printed appts ched and avs for tp for June and July...sed added tx....gv pt information for B-cep program for mammo since she is self pay [phone]

## 2013-12-07 NOTE — Patient Instructions (Signed)
[facility] Discharge Instructions for Patients Receiving Chemotherapy  Today you received the following chemotherapy agents: Oxaliplatin, Leucovorin, 5FU (Adrucil)  To help prevent nausea and vomiting after your treatment, we encourage you to take your nausea medication: Compazine 10 mg every 6 hrs as needed.    If you develop nausea and vomiting that is not controlled by your nausea medication, call the clinic.   BELOW ARE SYMPTOMS THAT SHOULD BE REPORTED IMMEDIATELY:  *FEVER GREATER THAN 100.5 F  *CHILLS WITH OR WITHOUT FEVER  NAUSEA AND VOMITING THAT IS NOT CONTROLLED WITH YOUR NAUSEA MEDICATION  *UNUSUAL SHORTNESS OF BREATH  *UNUSUAL BRUISING OR BLEEDING  TENDERNESS IN MOUTH AND THROAT WITH OR WITHOUT PRESENCE OF ULCERS  *URINARY PROBLEMS  *BOWEL PROBLEMS  UNUSUAL RASH Items with * indicate a potential emergency and should be followed up as soon as possible.  Feel free to call the clinic you have any questions or concerns. The clinic phone number is [phone].

## 2013-12-07 NOTE — Progress Notes (Addendum)
  [facility] OFFICE PROGRESS NOTE   Diagnosis:  Colon cancer.  INTERVAL HISTORY:   Amy Bentley returns as scheduled. She completed cycle 1 adjuvant FOLFOX 11/23/2013. She developed nausea on day 1. The nausea lasted 7 days. She took Compazine as needed. She had a headache on day 2. She developed a single mouth sore on the tongue. She utilized Biotene mouth rinse. Cold sensitivity lasted 7 days. She has periodic tingling in the fingertips and intermittent numbness in the feet. She has noted some hair loss. No abdominal pain.  Objective:  Vital signs in last 24 hours:  Blood pressure 129/90, pulse 60, temperature 98.2 F (36.8 C), temperature source Oral, resp. rate 18, height 5\' 1"  (1.549 m), weight 230 lb 6.4 oz (104.509 kg), SpO2 100.00%.    HEENT: No thrush or ulcerations. Resp: Lungs clear. Cardio: Regular cardiac rhythm. GI: Abdomen soft and nontender. No hepatomegaly. Healed midline incision. Vascular: No leg edema. Neuro: Vibratory sense mildly decreased over the fingertips and toes.  Breasts: No definite right breast mass.   Lab Results:  Lab Results  Component Value Date   WBC 4.6 12/07/2013   HGB 8.8* 12/07/2013   HCT 28.2* 12/07/2013   MCV 71.4* 12/07/2013   PLT 207 12/07/2013   NEUTROABS 2.9 12/07/2013    Imaging:  No results found.  Medications: I have reviewed the patient's current medications.  Assessment/Plan: 1. Stage IIC (T4b, N0), adenocarcinoma the sigmoid colon, status post a partial colectomy and en bloc bladder resection 10/20/2013, negative surgical margins, no preoperative colonoscopy. Mildly elevated preoperative CEA. CEA normal (1.0) on 11/09/2013. Cycle 1 adjuvant FOLFOX 11/23/2013. 2. Microcytic anemia-likely iron deficiency anemia. 3. Right breast nodule on the chest CT 11/14/2013. 4. Port-A-Cath placement 11/15/2013. 5. Delayed nausea following cycle 1 FOLFOX. Aloxi and Emend added with cycle 2.   Disposition: [name] appears stable. She has completed one cycle of adjuvant FOLFOX. Plan to proceed with cycle 2 today as scheduled. We will adjust the premedication regimen to include Aloxi and Emend due to nausea following cycle 1.  She has a persistent microcytic anemia. She will begin ferrous sulfate 325 mg 3 times daily.  She was noted to have a right breast nodule on chest CT 11/14/2013. We are referring her for a diagnostic mammogram.  She will return for a followup visit and cycle 3 FOLFOX in 2 weeks. She will contact the office in the interim with any problems.  Patient seen with Dr. Benay Spice.    Ned Card ANP/GNP-BC   12/07/2013  1:25 PM  This was a shared visit with Ned Card. Fullness in the medial rt. Breast without a discrete mass. Plan to continue folfox.  Increase iron. Refer for [employer]

## 2013-12-09 ENCOUNTER — Ambulatory Visit (HOSPITAL_BASED_OUTPATIENT_CLINIC_OR_DEPARTMENT_OTHER): Payer: Self-pay

## 2013-12-09 VITALS — BP 123/82 | HR 73 | Temp 98.8°F

## 2013-12-09 DIAGNOSIS — C187 Malignant neoplasm of sigmoid colon: Secondary | ICD-10-CM

## 2013-12-09 DIAGNOSIS — Z452 Encounter for adjustment and management of vascular access device: Secondary | ICD-10-CM

## 2013-12-09 MED ORDER — SODIUM CHLORIDE 0.9 % IJ SOLN
10.0000 mL | INTRAMUSCULAR | Status: DC | PRN
  Administered 2013-12-09: 10 mL
  Filled 2013-12-09: qty 10

## 2013-12-09 MED ORDER — HEPARIN SOD (PORK) LOCK FLUSH 100 UNIT/ML IV SOLN
500.0000 [IU] | Freq: Once | INTRAVENOUS | Status: AC | PRN
  Administered 2013-12-09: 500 [IU]
  Filled 2013-12-09: qty 5

## 2013-12-15 ENCOUNTER — Other Ambulatory Visit (HOSPITAL_COMMUNITY): Payer: Self-pay | Admitting: *Deleted

## 2013-12-15 DIAGNOSIS — N6310 Unspecified lump in the right breast, unspecified quadrant: Secondary | ICD-10-CM

## 2013-12-17 ENCOUNTER — Other Ambulatory Visit: Payer: Self-pay | Admitting: Oncology

## 2013-12-18 ENCOUNTER — Encounter (INDEPENDENT_AMBULATORY_CARE_PROVIDER_SITE_OTHER): Payer: Self-pay | Admitting: General Surgery

## 2013-12-18 ENCOUNTER — Ambulatory Visit (INDEPENDENT_AMBULATORY_CARE_PROVIDER_SITE_OTHER): Payer: Self-pay | Admitting: General Surgery

## 2013-12-18 VITALS — BP 130/78 | HR 76 | Temp 98.5°F | Ht 61.0 in | Wt 234.0 lb

## 2013-12-18 DIAGNOSIS — C187 Malignant neoplasm of sigmoid colon: Secondary | ICD-10-CM

## 2013-12-18 NOTE — Assessment & Plan Note (Signed)
No evidence of surgical complications.    Follow up in 6 months after chemo and colonoscopy.

## 2013-12-18 NOTE — Patient Instructions (Signed)
Increase miralax and stool softener to twice daily.  If that doesn't work, add dulcolax suppository (over the counter).  If still no good results, add milk of magnesia.  Follow up with me in 6 months.

## 2013-12-18 NOTE — Progress Notes (Signed)
HISTORY: Patient is around 2 months status post sigmoid resection and partial cystectomy in conjunction with Dr. Louis Meckel for a malignant colovesical fistula. She is doing very well and is back to work. She is no longer having to use a Foley catheter. She does have some pain at the left upper portion of her incision when she eats a lot. She is having some constipation. She is doing okay with her chemotherapy. She is not having any nausea or vomiting. Denies fevers and chills. She has not had any bulges in her wound.    EXAM: General:  Alert and oriented.   Incision:  Wound healing well.  abd soft, nt, nd.  No evidence of hernia.     PATHOLOGY: REASON FOR ADDENDUM, AMENDMENT OR CORRECTION: [id number]: AMENDMENT NOTE: Previously reported as: 9 lymph nodes, amended to 15 benign lymph nodes. ATTENTION: Revised diagnosis report.  Diagnosis Colon, segmental resection for tumor, with bladder dome and sigmoid colon - COLORECTAL ADENOCARCINOMA EXTENDING INTO PERICOLONIC CONNECTIVE TISSUE AND INTO ATTACHED URINARY BLADDER WALL. - MARGINS NOT INVOLVED. - NINE BENIGN LYMPH NODES (0/9).   ASSESSMENT AND PLAN:   Cancer of sigmoid colon, invading bladder, T4N0, s/p en bloc resection with Dr. Louis Meckel 4/24 No evidence of surgical complications.    Follow up in 6 months after chemo and colonoscopy.     Constipation:  Increase miralax and stool softener.  Add suppository if no results.     Milus Height, MD Surgical Oncology, [facility]  [name], MD No ref. Yina Riviere found

## 2013-12-19 ENCOUNTER — Encounter (HOSPITAL_COMMUNITY): Payer: Self-pay

## 2013-12-19 ENCOUNTER — Ambulatory Visit (HOSPITAL_COMMUNITY)
Admission: RE | Admit: 2013-12-19 | Discharge: 2013-12-19 | Disposition: A | Discharge status: Home / Self Care | Payer: No Typology Code available for payment source | Source: Ambulatory Visit | Attending: Obstetrics and Gynecology | Admitting: Obstetrics and Gynecology

## 2013-12-19 VITALS — BP 118/82 | Temp 98.5°F | Ht 61.0 in | Wt 235.2 lb

## 2013-12-19 DIAGNOSIS — Z1239 Encounter for other screening for malignant neoplasm of breast: Secondary | ICD-10-CM

## 2013-12-19 NOTE — Patient Instructions (Addendum)
Taught Amy Bentley how to perform BSE and gave educational materials to take home. Patient did not need a Pap smear today due to last Pap smear was in April 2014 per patient. Let her know BCCCP will cover Pap smears every 3 years unless has a history of abnormal Pap smears. Referred patient to the [facility] for diagnostic mammogram and right breast ultrasound per recommendation. Appointment scheduled for Tuesday December 26, 2013 at 1415.  Patient aware of appointment and will be there.  Adianna Darwin verbalized understanding.  [place], Arvil Chaco, RN 2:27 PM

## 2013-12-19 NOTE — Progress Notes (Signed)
Patient referred to [facility] due a right breast nodule being seen on CT scan 11/14/2013.  Pap Smear:  Pap smear not completed today. Last Pap smear was in April 2014 at [facility] and normal per patient. Per patient has no history of an abnormal Pap smear. No Pap smear results in EPIC.  Physical exam: Breasts Breasts symmetrical. No skin abnormalities bilateral breasts. No nipple retraction bilateral breasts. No nipple discharge bilateral breasts. No lymphadenopathy. No lumps palpated bilateral breasts. No complaints of pain or tenderness on exam. Referred patient to the [facility] for diagnostic mammogram and right breast ultrasound per recommendation. Appointment scheduled for Tuesday December 26, 2013 at 1415.   Pelvic/Bimanual No Pap smear completed today since last Pap smear was in April 2014 per patient. Pap smear not indicated per BCCCP guidelines.

## 2013-12-21 ENCOUNTER — Other Ambulatory Visit: Payer: Self-pay | Admitting: Oncology

## 2013-12-21 ENCOUNTER — Ambulatory Visit: Payer: Self-pay | Admitting: Nutrition

## 2013-12-21 ENCOUNTER — Telehealth: Payer: Self-pay | Admitting: Oncology

## 2013-12-21 ENCOUNTER — Ambulatory Visit (HOSPITAL_BASED_OUTPATIENT_CLINIC_OR_DEPARTMENT_OTHER): Payer: Self-pay

## 2013-12-21 ENCOUNTER — Other Ambulatory Visit (HOSPITAL_BASED_OUTPATIENT_CLINIC_OR_DEPARTMENT_OTHER): Payer: Self-pay

## 2013-12-21 ENCOUNTER — Ambulatory Visit (HOSPITAL_BASED_OUTPATIENT_CLINIC_OR_DEPARTMENT_OTHER): Payer: Self-pay | Admitting: Nurse Practitioner

## 2013-12-21 VITALS — BP 142/88 | HR 69 | Temp 98.3°F | Resp 19 | Ht 61.0 in | Wt 234.0 lb

## 2013-12-21 DIAGNOSIS — G620 Drug-induced polyneuropathy: Secondary | ICD-10-CM

## 2013-12-21 DIAGNOSIS — C187 Malignant neoplasm of sigmoid colon: Secondary | ICD-10-CM

## 2013-12-21 DIAGNOSIS — Z5111 Encounter for antineoplastic chemotherapy: Secondary | ICD-10-CM

## 2013-12-21 LAB — CBC WITH DIFFERENTIAL/PLATELET
BASO%: 0.8 % (ref 0.0–2.0)
Basophils Absolute: 0 10*3/uL (ref 0.0–0.1)
EOS%: 0.9 % (ref 0.0–7.0)
Eosinophils Absolute: 0 10*3/uL (ref 0.0–0.5)
HCT: 29 % — ABNORMAL LOW (ref 34.8–46.6)
HGB: 9 g/dL — ABNORMAL LOW (ref 11.6–15.9)
LYMPH%: 26.4 % (ref 14.0–49.7)
MCH: 22.4 pg — ABNORMAL LOW (ref 25.1–34.0)
MCHC: 31.1 g/dL — ABNORMAL LOW (ref 31.5–36.0)
MCV: 71.9 fL — ABNORMAL LOW (ref 79.5–101.0)
MONO#: 0.4 10*3/uL (ref 0.1–0.9)
MONO%: 9.4 % (ref 0.0–14.0)
NEUT#: 2.5 10*3/uL (ref 1.5–6.5)
NEUT%: 62.5 % (ref 38.4–76.8)
Platelets: 188 10*3/uL (ref 145–400)
RBC: 4.04 10*6/uL (ref 3.70–5.45)
RDW: 22.1 % — ABNORMAL HIGH (ref 11.2–14.5)
WBC: 4 10*3/uL (ref 3.9–10.3)
lymph#: 1 10*3/uL (ref 0.9–3.3)

## 2013-12-21 LAB — COMPREHENSIVE METABOLIC PANEL (CC13)
ALT: 12 U/L (ref 0–55)
AST: 17 U/L (ref 5–34)
Albumin: 3.4 g/dL — ABNORMAL LOW (ref 3.5–5.0)
Alkaline Phosphatase: 140 U/L (ref 40–150)
Anion Gap: 8 mEq/L (ref 3–11)
BUN: 12.6 mg/dL (ref 7.0–26.0)
CO2: 25 mEq/L (ref 22–29)
Calcium: 9.2 mg/dL (ref 8.4–10.4)
Chloride: 106 mEq/L (ref 98–109)
Creatinine: 0.8 mg/dL (ref 0.6–1.1)
Glucose: 94 mg/dl (ref 70–140)
Potassium: 3.9 mEq/L (ref 3.5–5.1)
Sodium: 139 mEq/L (ref 136–145)
Total Bilirubin: 0.32 mg/dL (ref 0.20–1.20)
Total Protein: 6.9 g/dL (ref 6.4–8.3)

## 2013-12-21 MED ORDER — SODIUM CHLORIDE 0.9 % IV SOLN
150.0000 mg | Freq: Once | INTRAVENOUS | Status: AC
  Administered 2013-12-21: 150 mg via INTRAVENOUS
  Filled 2013-12-21: qty 5

## 2013-12-21 MED ORDER — LORAZEPAM 0.5 MG PO TABS: 0.5000 mg | ORAL_TABLET | Freq: Three times a day (TID) | ORAL | Status: DC | PRN

## 2013-12-21 MED ORDER — DEXAMETHASONE SODIUM PHOSPHATE 10 MG/ML IJ SOLN
10.0000 mg | Freq: Once | INTRAMUSCULAR | Status: AC
  Administered 2013-12-21: 10 mg via INTRAVENOUS

## 2013-12-21 MED ORDER — DEXAMETHASONE SODIUM PHOSPHATE 10 MG/ML IJ SOLN
INTRAMUSCULAR | Status: AC
  Filled 2013-12-21: qty 1

## 2013-12-21 MED ORDER — LEUCOVORIN CALCIUM INJECTION 350 MG
401.0000 mg/m2 | Freq: Once | INTRAVENOUS | Status: AC
  Administered 2013-12-21: 850 mg via INTRAVENOUS
  Filled 2013-12-21: qty 42.5

## 2013-12-21 MED ORDER — DEXTROSE 5 % IV SOLN
Freq: Once | INTRAVENOUS | Status: AC
  Administered 2013-12-21: 13:00:00 via INTRAVENOUS

## 2013-12-21 MED ORDER — SODIUM CHLORIDE 0.9 % IJ SOLN
10.0000 mL | INTRAMUSCULAR | Status: DC | PRN
  Filled 2013-12-21: qty 10

## 2013-12-21 MED ORDER — OXALIPLATIN CHEMO INJECTION 100 MG/20ML
65.0000 mg/m2 | Freq: Once | INTRAVENOUS | Status: AC
  Administered 2013-12-21: 135 mg via INTRAVENOUS
  Filled 2013-12-21: qty 27

## 2013-12-21 MED ORDER — SODIUM CHLORIDE 0.9 % IV SOLN
Freq: Once | INTRAVENOUS | Status: AC
  Administered 2013-12-21: 13:00:00 via INTRAVENOUS

## 2013-12-21 MED ORDER — SODIUM CHLORIDE 0.9 % IV SOLN
2400.0000 mg/m2 | INTRAVENOUS | Status: DC
  Administered 2013-12-21: 5000 mg via INTRAVENOUS
  Filled 2013-12-21: qty 100

## 2013-12-21 MED ORDER — PALONOSETRON HCL INJECTION 0.25 MG/5ML
INTRAVENOUS | Status: AC
  Filled 2013-12-21: qty 5

## 2013-12-21 MED ORDER — PALONOSETRON HCL INJECTION 0.25 MG/5ML
0.2500 mg | Freq: Once | INTRAVENOUS | Status: AC
  Administered 2013-12-21: 0.25 mg via INTRAVENOUS

## 2013-12-21 MED ORDER — FLUOROURACIL CHEMO INJECTION 2.5 GM/50ML
400.0000 mg/m2 | Freq: Once | INTRAVENOUS | Status: AC
  Administered 2013-12-21: 850 mg via INTRAVENOUS
  Filled 2013-12-21: qty 17

## 2013-12-21 MED ORDER — HEPARIN SOD (PORK) LOCK FLUSH 100 UNIT/ML IV SOLN
500.0000 [IU] | Freq: Once | INTRAVENOUS | Status: DC | PRN
  Filled 2013-12-21: qty 5

## 2013-12-21 NOTE — Progress Notes (Signed)
  [facility] OFFICE PROGRESS NOTE   Diagnosis:  Colon cancer.  INTERVAL HISTORY:   Amy Bentley returns as scheduled. She completed cycle 2 adjuvant FOLFOX on 12/07/2013. She noted a marked improvement in the nausea/vomiting with the adjustment of her premedications with cycle 2. She had a single episode of mild nausea. No vomiting. No mouth sores. No diarrhea. She did not have any headaches following the most recent cycle. She reports persistent oral cold sensitivity. She has persistent numbness/tingling in the hands. This does not interfere with activity. She notes that her feet are numb and tingly when she lays down. She had difficulty sleeping following the most recent chemotherapy day 1. She continues to note hair loss. No abdominal pain.  Objective:  Vital signs in last 24 hours:  Blood pressure 142/88, pulse 69, temperature 98.3 F (36.8 C), temperature source Oral, resp. rate 19, height 5\' 1"  (1.549 m), weight 234 lb (106.142 kg), last menstrual period 12/13/2013, SpO2 98.00%.    HEENT: No thrush or ulcerations. Resp: Lungs clear. Cardio: Regular cardiac rhythm. GI: Abdomen soft and nontender. No hepatomegaly. Vascular: No leg edema. Calves soft and nontender. Neuro: Vibratory sense intact over the fingertips per tuning fork exam.     Lab Results:  Lab Results  Component Value Date   WBC 4.0 12/21/2013   HGB 9.0* 12/21/2013   HCT 29.0* 12/21/2013   MCV 71.9* 12/21/2013   PLT 188 12/21/2013   NEUTROABS 2.5 12/21/2013    Imaging:  No results found.  Medications: I have reviewed the patient's current medications.  Assessment/Plan: 1. Stage IIC (T4b, N0), adenocarcinoma the sigmoid colon, status post a partial colectomy and en bloc bladder resection 10/20/2013, negative surgical margins, no preoperative colonoscopy. Mildly elevated preoperative CEA.  CEA normal (1.0) on 11/09/2013.  Cycle 1 adjuvant FOLFOX 11/23/2013. Cycle 2 adjuvant FOLFOX  12/07/2013. 2. Microcytic anemia-likely iron deficiency anemia. She continues oral iron. 3. Right breast nodule on the chest CT 11/14/2013. She has been referred for a diagnostic mammogram. 4. Port-A-Cath placement 11/15/2013. 5. Delayed nausea following cycle 1 FOLFOX. Aloxi and Emend added with cycle 2 with improvement. 6. Question early oxaliplatin neuropathy with persistent oral cold sensitivity and numbness/tingling in the hands.   Disposition: Amy Bentley has completed 2 cycles of FOLFOX. 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 I discussed this with Dr. Benay Spice. The oxaliplatin will be dose reduced to 65 mg per meter squared beginning with cycle 3 today.  She will return for a followup visit and cycle 4 FOLFOX in 2 weeks. She will contact the office in the interim with any problems.    Ned Card ANP/GNP-BC   12/21/2013  12:06 PM

## 2013-12-21 NOTE — Progress Notes (Signed)
Patient states she is tolerating her chemotherapy well.  She denies nausea and vomiting.  Appetite is good and she is eating well.  Per M.D., wound is healing well.  Patient reports she has constipation.  However, she is taking MiraLax, and stool softener, and this has helped.  She is interested in nutrition strategies for decreased oral intake on steroids.  Weight documented as 234 pounds, which is increased from 228 pounds, May 11.  Nutrition diagnosis: Unintended weight loss resolved.  Intervention: Patient encouraged and educated to continue healthy, plant-based diet to preserve lean body mass.  Recommended patient increase water between meals.  Provided strategies for improving constipation.  Encouraged patient to consume more fruits, vegetables at the beginning of her meal and concentrate on portion control.  Teach back method used.  Questions were answered.  Monitoring, evaluation, goals: Patient will tolerate continued healthy diet for weight maintenance.  Next visit: No followup is scheduled.  Patient will contact me.  She has questions or concerns.

## 2013-12-21 NOTE — Telephone Encounter (Signed)
gv adn prnted appt sched and avs for pt for June and July....sed added tx... °

## 2013-12-21 NOTE — Patient Instructions (Signed)
[facility] Discharge Instructions for Patients Receiving Chemotherapy  Today you received the following chemotherapy agents Oxaliplatin, Lecovorin, Adrucil.   To help prevent nausea and vomiting after your treatment, we encourage you to take your nausea medication as prescribed.   If you develop nausea and vomiting that is not controlled by your nausea medication, call the clinic.   BELOW ARE SYMPTOMS THAT SHOULD BE REPORTED IMMEDIATELY:  *FEVER GREATER THAN 100.5 F  *CHILLS WITH OR WITHOUT FEVER  NAUSEA AND VOMITING THAT IS NOT CONTROLLED WITH YOUR NAUSEA MEDICATION  *UNUSUAL SHORTNESS OF BREATH  *UNUSUAL BRUISING OR BLEEDING  TENDERNESS IN MOUTH AND THROAT WITH OR WITHOUT PRESENCE OF ULCERS  *URINARY PROBLEMS  *BOWEL PROBLEMS  UNUSUAL RASH Items with * indicate a potential emergency and should be followed up as soon as possible.  Feel free to call the clinic you have any questions or concerns. The clinic phone number is [phone].

## 2013-12-23 ENCOUNTER — Ambulatory Visit (HOSPITAL_BASED_OUTPATIENT_CLINIC_OR_DEPARTMENT_OTHER): Payer: Self-pay

## 2013-12-23 ENCOUNTER — Other Ambulatory Visit: Payer: Self-pay | Admitting: Emergency Medicine

## 2013-12-23 VITALS — BP 134/87 | HR 76 | Temp 97.9°F | Resp 18

## 2013-12-23 DIAGNOSIS — Z452 Encounter for adjustment and management of vascular access device: Secondary | ICD-10-CM

## 2013-12-23 DIAGNOSIS — C187 Malignant neoplasm of sigmoid colon: Secondary | ICD-10-CM

## 2013-12-23 MED ORDER — SODIUM CHLORIDE 0.9 % IJ SOLN
10.0000 mL | INTRAMUSCULAR | Status: DC | PRN
  Administered 2013-12-23: 10 mL
  Filled 2013-12-23: qty 10

## 2013-12-23 MED ORDER — HEPARIN SOD (PORK) LOCK FLUSH 100 UNIT/ML IV SOLN
500.0000 [IU] | Freq: Once | INTRAVENOUS | Status: AC | PRN
  Administered 2013-12-23: 500 [IU]
  Filled 2013-12-23: qty 5

## 2013-12-26 ENCOUNTER — Ambulatory Visit
Admission: RE | Admit: 2013-12-26 | Discharge: 2013-12-26 | Disposition: A | Discharge status: Home / Self Care | Payer: No Typology Code available for payment source | Source: Ambulatory Visit | Attending: Obstetrics and Gynecology | Admitting: Obstetrics and Gynecology

## 2013-12-26 ENCOUNTER — Encounter (INDEPENDENT_AMBULATORY_CARE_PROVIDER_SITE_OTHER): Payer: Self-pay

## 2013-12-26 DIAGNOSIS — N6310 Unspecified lump in the right breast, unspecified quadrant: Secondary | ICD-10-CM

## 2013-12-30 ENCOUNTER — Other Ambulatory Visit: Payer: Self-pay | Admitting: Oncology

## 2014-01-04 ENCOUNTER — Ambulatory Visit (HOSPITAL_BASED_OUTPATIENT_CLINIC_OR_DEPARTMENT_OTHER): Payer: Self-pay | Admitting: Nurse Practitioner

## 2014-01-04 ENCOUNTER — Other Ambulatory Visit (HOSPITAL_BASED_OUTPATIENT_CLINIC_OR_DEPARTMENT_OTHER): Payer: Self-pay

## 2014-01-04 ENCOUNTER — Other Ambulatory Visit: Payer: Self-pay | Admitting: *Deleted

## 2014-01-04 ENCOUNTER — Ambulatory Visit (HOSPITAL_BASED_OUTPATIENT_CLINIC_OR_DEPARTMENT_OTHER): Payer: Self-pay

## 2014-01-04 ENCOUNTER — Telehealth: Payer: Self-pay | Admitting: Oncology

## 2014-01-04 ENCOUNTER — Encounter: Payer: Self-pay | Admitting: Nutrition

## 2014-01-04 VITALS — BP 139/83 | HR 69 | Temp 98.6°F | Resp 18 | Ht 61.0 in | Wt 235.1 lb

## 2014-01-04 DIAGNOSIS — R928 Other abnormal and inconclusive findings on diagnostic imaging of breast: Secondary | ICD-10-CM

## 2014-01-04 DIAGNOSIS — C187 Malignant neoplasm of sigmoid colon: Secondary | ICD-10-CM

## 2014-01-04 DIAGNOSIS — L299 Pruritus, unspecified: Secondary | ICD-10-CM

## 2014-01-04 DIAGNOSIS — Z5111 Encounter for antineoplastic chemotherapy: Secondary | ICD-10-CM

## 2014-01-04 DIAGNOSIS — G622 Polyneuropathy due to other toxic agents: Secondary | ICD-10-CM

## 2014-01-04 DIAGNOSIS — T451X5A Adverse effect of antineoplastic and immunosuppressive drugs, initial encounter: Secondary | ICD-10-CM

## 2014-01-04 DIAGNOSIS — R110 Nausea: Secondary | ICD-10-CM

## 2014-01-04 DIAGNOSIS — D509 Iron deficiency anemia, unspecified: Secondary | ICD-10-CM

## 2014-01-04 LAB — CBC WITH DIFFERENTIAL/PLATELET
BASO%: 1 % (ref 0.0–2.0)
Basophils Absolute: 0 10*3/uL (ref 0.0–0.1)
EOS%: 0.6 % (ref 0.0–7.0)
Eosinophils Absolute: 0 10*3/uL (ref 0.0–0.5)
HCT: 31.6 % — ABNORMAL LOW (ref 34.8–46.6)
HGB: 9.7 g/dL — ABNORMAL LOW (ref 11.6–15.9)
LYMPH%: 25.7 % (ref 14.0–49.7)
MCH: 22.4 pg — ABNORMAL LOW (ref 25.1–34.0)
MCHC: 30.8 g/dL — ABNORMAL LOW (ref 31.5–36.0)
MCV: 72.6 fL — ABNORMAL LOW (ref 79.5–101.0)
MONO#: 0.4 10*3/uL (ref 0.1–0.9)
MONO%: 10 % (ref 0.0–14.0)
NEUT#: 2.5 10*3/uL (ref 1.5–6.5)
NEUT%: 62.7 % (ref 38.4–76.8)
Platelets: 182 10*3/uL (ref 145–400)
RBC: 4.35 10*6/uL (ref 3.70–5.45)
RDW: 24.7 % — ABNORMAL HIGH (ref 11.2–14.5)
WBC: 4.1 10*3/uL (ref 3.9–10.3)
lymph#: 1 10*3/uL (ref 0.9–3.3)

## 2014-01-04 LAB — COMPREHENSIVE METABOLIC PANEL (CC13)
ALT: 12 U/L (ref 0–55)
AST: 16 U/L (ref 5–34)
Albumin: 3.5 g/dL (ref 3.5–5.0)
Alkaline Phosphatase: 142 U/L (ref 40–150)
Anion Gap: 9 mEq/L (ref 3–11)
BUN: 11.5 mg/dL (ref 7.0–26.0)
CO2: 23 mEq/L (ref 22–29)
Calcium: 9.3 mg/dL (ref 8.4–10.4)
Chloride: 108 mEq/L (ref 98–109)
Creatinine: 0.8 mg/dL (ref 0.6–1.1)
Glucose: 93 mg/dl (ref 70–140)
Potassium: 3.8 mEq/L (ref 3.5–5.1)
Sodium: 140 mEq/L (ref 136–145)
Total Bilirubin: 0.32 mg/dL (ref 0.20–1.20)
Total Protein: 7.3 g/dL (ref 6.4–8.3)

## 2014-01-04 MED ORDER — OXALIPLATIN CHEMO INJECTION 100 MG/20ML
65.0000 mg/m2 | Freq: Once | INTRAVENOUS | Status: AC
  Administered 2014-01-04: 135 mg via INTRAVENOUS
  Filled 2014-01-04: qty 27

## 2014-01-04 MED ORDER — PALONOSETRON HCL INJECTION 0.25 MG/5ML
INTRAVENOUS | Status: AC
  Filled 2014-01-04: qty 5

## 2014-01-04 MED ORDER — LEUCOVORIN CALCIUM INJECTION 350 MG
401.0000 mg/m2 | Freq: Once | INTRAMUSCULAR | Status: AC
  Administered 2014-01-04: 850 mg via INTRAVENOUS
  Filled 2014-01-04: qty 42.5

## 2014-01-04 MED ORDER — DEXAMETHASONE SODIUM PHOSPHATE 10 MG/ML IJ SOLN
10.0000 mg | Freq: Once | INTRAMUSCULAR | Status: AC
  Administered 2014-01-04: 10 mg via INTRAVENOUS

## 2014-01-04 MED ORDER — FLUOROURACIL CHEMO INJECTION 2.5 GM/50ML
400.0000 mg/m2 | Freq: Once | INTRAVENOUS | Status: AC
Start: 2014-01-04 — End: 2014-01-04
  Administered 2014-01-04: 850 mg via INTRAVENOUS
  Filled 2014-01-04: qty 17

## 2014-01-04 MED ORDER — PALONOSETRON HCL INJECTION 0.25 MG/5ML
0.2500 mg | Freq: Once | INTRAVENOUS | Status: AC
  Administered 2014-01-04: 0.25 mg via INTRAVENOUS

## 2014-01-04 MED ORDER — SODIUM CHLORIDE 0.9 % IV SOLN
150.0000 mg | Freq: Once | INTRAVENOUS | Status: AC
  Administered 2014-01-04: 150 mg via INTRAVENOUS
  Filled 2014-01-04: qty 5

## 2014-01-04 MED ORDER — SODIUM CHLORIDE 0.9 % IV SOLN
2400.0000 mg/m2 | INTRAVENOUS | Status: DC
  Administered 2014-01-04: 5000 mg via INTRAVENOUS
  Filled 2014-01-04: qty 100

## 2014-01-04 MED ORDER — DEXAMETHASONE SODIUM PHOSPHATE 10 MG/ML IJ SOLN
INTRAMUSCULAR | Status: AC
  Filled 2014-01-04: qty 1

## 2014-01-04 MED ORDER — DEXTROSE 5 % IV SOLN
Freq: Once | INTRAVENOUS | Status: AC
  Administered 2014-01-04: 14:00:00 via INTRAVENOUS

## 2014-01-04 NOTE — Progress Notes (Signed)
[facility] OFFICE PROGRESS NOTE   Diagnosis:  Colon cancer.  INTERVAL HISTORY:   [name] returns as scheduled. She completed cycle 3 adjuvant FOLFOX on 12/21/2013. The oxaliplatin was dose reduced with cycle 3 due to concern for early oxaliplatin neuropathy with persistent oral cold sensitivity and numbness/tingling in the hands.  She continues to have mild intermittent oral cold sensitivity, overall improved. She denies numbness or tingling in her hands or feet. No nausea or vomiting. No mouth sores. No diarrhea. She has recently noted that her eyes are "itchy" and at times watery. She wonders if she may have allergies. She noted generalized aching discomfort for about 1 week after cycle 3. She continues to have a good appetite. No abdominal pain.  Objective:  Vital signs in last 24 hours:  Blood pressure 139/83, pulse 69, temperature 98.6 F (37 C), temperature source Oral, resp. rate 18, height 5\' 1"  (1.549 m), weight 235 lb 1.6 oz (106.641 kg), last menstrual period 12/13/2013.    HEENT: No thrush or ulcerations. No conjunctival erythema. Resp: Lungs clear. Cardio: Regular cardiac rhythm. GI: Abdomen soft and nontender. No hepatomegaly. Vascular: No leg edema. Neuro: Vibratory sense mildly decreased over the fingertips per tuning fork exam.  Skin: Palms without erythema.    Lab Results:  Lab Results  Component Value Date   WBC 4.1 01/04/2014   HGB 9.7* 01/04/2014   HCT 31.6* 01/04/2014   MCV 72.6* 01/04/2014   PLT 182 01/04/2014   NEUTROABS 2.5 01/04/2014    Imaging:  No results found.  Medications: I have reviewed the patient's current medications.  Assessment/Plan: 1. Stage IIC (T4b, N0), adenocarcinoma the sigmoid colon, status post a partial colectomy and en bloc bladder resection 10/20/2013, negative surgical margins, no preoperative colonoscopy. Mildly elevated preoperative CEA.  CEA normal (1.0) on 11/09/2013.  Cycle 1 adjuvant FOLFOX  11/23/2013.  Cycle 2 adjuvant FOLFOX 12/07/2013. Cycle 3 adjuvant FOLFOX 12/21/2013 (oxaliplatin dose reduced to 65 mg per meter squared due to neuropathy symptoms). 2. Microcytic anemia-likely iron deficiency anemia. She continues oral iron. 3. Right breast nodule on the chest CT 11/14/2013.   Diagnostic mammogram and ultrasound 12/26/2013 showed a probable fibroadenoma within the inner right breast.   She discussed management options with the radiologist including excision, biopsy and short interval followup. She elected short interval followup. A followup ultrasound is recommended at 6, 12 and 24 months to assess stability. 4. Port-A-Cath placement 11/15/2013. 5. Delayed nausea following cycle 1 FOLFOX. Aloxi and Emend added with cycle 2 with improvement. 6. Question early oxaliplatin neuropathy with persistent oral cold sensitivity and numbness/tingling in the hands following cycle 2. Oxaliplatin was dose reduced to 65 mg per meter squared beginning with cycle 3. Symptoms are better. 7. Ocular pruritus. Question related to allergies, question related to 5-fluorouracil.   Disposition: Amy Bentley appears stable. She has completed 3 cycles of adjuvant FOLFOX. Plan to proceed with cycle 4 today as scheduled. The oxaliplatin dose will remain at 65 mg per meter squared with cycle 4.  She is experiencing ocular pruritus. She plans to followup with her primary care provider for evaluation of possible allergies. She understands this may be related to the 5-FU.  She will return for a followup visit and cycle 5 FOLFOX in 2 weeks. She will contact the office in the interim with any problems.  Plan reviewed with Dr. Benay Spice.    Ned Card ANP/GNP-BC   01/04/2014  1:04 PM

## 2014-01-04 NOTE — Telephone Encounter (Signed)
gv adn printed appt sched and avs for pt for July and Aug....sed added tx. °

## 2014-01-04 NOTE — Patient Instructions (Signed)
[facility] Discharge Instructions for Patients Receiving Chemotherapy  Today you received the following chemotherapy agents oxaliplatin/leucovorin/flourouracil.    To help prevent nausea and vomiting after your treatment, we encourage you to take your nausea medication as directed.    If you develop nausea and vomiting that is not controlled by your nausea medication, call the clinic.   BELOW ARE SYMPTOMS THAT SHOULD BE REPORTED IMMEDIATELY:  *FEVER GREATER THAN 100.5 F  *CHILLS WITH OR WITHOUT FEVER  NAUSEA AND VOMITING THAT IS NOT CONTROLLED WITH YOUR NAUSEA MEDICATION  *UNUSUAL SHORTNESS OF BREATH  *UNUSUAL BRUISING OR BLEEDING  TENDERNESS IN MOUTH AND THROAT WITH OR WITHOUT PRESENCE OF ULCERS  *URINARY PROBLEMS  *BOWEL PROBLEMS  UNUSUAL RASH Items with * indicate a potential emergency and should be followed up as soon as possible.  Feel free to call the clinic you have any questions or concerns. The clinic phone number is [phone].

## 2014-01-06 ENCOUNTER — Ambulatory Visit (HOSPITAL_BASED_OUTPATIENT_CLINIC_OR_DEPARTMENT_OTHER): Payer: Self-pay

## 2014-01-06 VITALS — BP 127/78 | HR 71 | Temp 98.5°F | Resp 18

## 2014-01-06 DIAGNOSIS — C187 Malignant neoplasm of sigmoid colon: Secondary | ICD-10-CM

## 2014-01-06 MED ORDER — SODIUM CHLORIDE 0.9 % IJ SOLN
10.0000 mL | INTRAMUSCULAR | Status: DC | PRN
  Administered 2014-01-06: 10 mL
  Filled 2014-01-06: qty 10

## 2014-01-06 MED ORDER — HEPARIN SOD (PORK) LOCK FLUSH 100 UNIT/ML IV SOLN
500.0000 [IU] | Freq: Once | INTRAVENOUS | Status: AC | PRN
  Administered 2014-01-06: 500 [IU]
  Filled 2014-01-06: qty 5

## 2014-01-14 ENCOUNTER — Other Ambulatory Visit: Payer: Self-pay | Admitting: Oncology

## 2014-01-18 ENCOUNTER — Telehealth: Payer: Self-pay | Admitting: Oncology

## 2014-01-18 ENCOUNTER — Other Ambulatory Visit: Payer: Self-pay | Admitting: *Deleted

## 2014-01-18 ENCOUNTER — Ambulatory Visit (HOSPITAL_BASED_OUTPATIENT_CLINIC_OR_DEPARTMENT_OTHER): Payer: No Typology Code available for payment source

## 2014-01-18 ENCOUNTER — Other Ambulatory Visit (HOSPITAL_BASED_OUTPATIENT_CLINIC_OR_DEPARTMENT_OTHER): Payer: No Typology Code available for payment source

## 2014-01-18 ENCOUNTER — Telehealth: Payer: Self-pay | Admitting: *Deleted

## 2014-01-18 ENCOUNTER — Ambulatory Visit (HOSPITAL_BASED_OUTPATIENT_CLINIC_OR_DEPARTMENT_OTHER): Payer: No Typology Code available for payment source | Admitting: Oncology

## 2014-01-18 VITALS — BP 130/74 | HR 72 | Temp 98.4°F | Resp 19 | Ht 61.0 in | Wt 243.4 lb

## 2014-01-18 DIAGNOSIS — C187 Malignant neoplasm of sigmoid colon: Secondary | ICD-10-CM

## 2014-01-18 DIAGNOSIS — L298 Other pruritus: Secondary | ICD-10-CM

## 2014-01-18 DIAGNOSIS — T451X5A Adverse effect of antineoplastic and immunosuppressive drugs, initial encounter: Secondary | ICD-10-CM

## 2014-01-18 DIAGNOSIS — D649 Anemia, unspecified: Secondary | ICD-10-CM

## 2014-01-18 DIAGNOSIS — Z5111 Encounter for antineoplastic chemotherapy: Secondary | ICD-10-CM

## 2014-01-18 LAB — COMPREHENSIVE METABOLIC PANEL (CC13)
ALT: 32 U/L (ref 0–55)
AST: 29 U/L (ref 5–34)
Albumin: 3.2 g/dL — ABNORMAL LOW (ref 3.5–5.0)
Alkaline Phosphatase: 142 U/L (ref 40–150)
Anion Gap: 8 mEq/L (ref 3–11)
BUN: 11.7 mg/dL (ref 7.0–26.0)
CO2: 26 mEq/L (ref 22–29)
Calcium: 9 mg/dL (ref 8.4–10.4)
Chloride: 108 mEq/L (ref 98–109)
Creatinine: 0.9 mg/dL (ref 0.6–1.1)
Glucose: 83 mg/dl (ref 70–140)
Potassium: 3.9 mEq/L (ref 3.5–5.1)
Sodium: 142 mEq/L (ref 136–145)
Total Bilirubin: 0.28 mg/dL (ref 0.20–1.20)
Total Protein: 6.5 g/dL (ref 6.4–8.3)

## 2014-01-18 LAB — CBC WITH DIFFERENTIAL/PLATELET
BASO%: 0.7 % (ref 0.0–2.0)
Basophils Absolute: 0 10*3/uL (ref 0.0–0.1)
EOS%: 1.1 % (ref 0.0–7.0)
Eosinophils Absolute: 0 10*3/uL (ref 0.0–0.5)
HCT: 29.9 % — ABNORMAL LOW (ref 34.8–46.6)
HGB: 9.1 g/dL — ABNORMAL LOW (ref 11.6–15.9)
LYMPH%: 20.9 % (ref 14.0–49.7)
MCH: 22.5 pg — ABNORMAL LOW (ref 25.1–34.0)
MCHC: 30.4 g/dL — ABNORMAL LOW (ref 31.5–36.0)
MCV: 74.1 fL — ABNORMAL LOW (ref 79.5–101.0)
MONO#: 0.4 10*3/uL (ref 0.1–0.9)
MONO%: 10.2 % (ref 0.0–14.0)
NEUT#: 2.4 10*3/uL (ref 1.5–6.5)
NEUT%: 67.1 % (ref 38.4–76.8)
Platelets: 164 10*3/uL (ref 145–400)
RBC: 4.04 10*6/uL (ref 3.70–5.45)
RDW: 26.7 % — ABNORMAL HIGH (ref 11.2–14.5)
WBC: 3.6 10*3/uL — ABNORMAL LOW (ref 3.9–10.3)
lymph#: 0.8 10*3/uL — ABNORMAL LOW (ref 0.9–3.3)

## 2014-01-18 MED ORDER — FLUOROURACIL CHEMO INJECTION 2.5 GM/50ML
400.0000 mg/m2 | Freq: Once | INTRAVENOUS | Status: AC
  Administered 2014-01-18: 850 mg via INTRAVENOUS
  Filled 2014-01-18: qty 17

## 2014-01-18 MED ORDER — DEXAMETHASONE SODIUM PHOSPHATE 10 MG/ML IJ SOLN
10.0000 mg | Freq: Once | INTRAMUSCULAR | Status: AC
Start: 2014-01-18 — End: 2014-01-18
  Administered 2014-01-18: 10 mg via INTRAVENOUS

## 2014-01-18 MED ORDER — DEXTROSE 5 % IV SOLN
Freq: Once | INTRAVENOUS | Status: AC
  Administered 2014-01-18: 11:00:00 via INTRAVENOUS

## 2014-01-18 MED ORDER — LEUCOVORIN CALCIUM INJECTION 350 MG
401.0000 mg/m2 | Freq: Once | INTRAVENOUS | Status: AC
  Administered 2014-01-18: 850 mg via INTRAVENOUS
  Filled 2014-01-18: qty 42.5

## 2014-01-18 MED ORDER — PALONOSETRON HCL INJECTION 0.25 MG/5ML
INTRAVENOUS | Status: AC
  Filled 2014-01-18: qty 5

## 2014-01-18 MED ORDER — SODIUM CHLORIDE 0.9 % IV SOLN
150.0000 mg | Freq: Once | INTRAVENOUS | Status: AC
  Administered 2014-01-18: 150 mg via INTRAVENOUS
  Filled 2014-01-18: qty 5

## 2014-01-18 MED ORDER — OXALIPLATIN CHEMO INJECTION 100 MG/20ML
65.0000 mg/m2 | Freq: Once | INTRAVENOUS | Status: AC
  Administered 2014-01-18: 135 mg via INTRAVENOUS
  Filled 2014-01-18: qty 27

## 2014-01-18 MED ORDER — DEXAMETHASONE SODIUM PHOSPHATE 10 MG/ML IJ SOLN
INTRAMUSCULAR | Status: AC
  Filled 2014-01-18: qty 1

## 2014-01-18 MED ORDER — SODIUM CHLORIDE 0.9 % IV SOLN
2400.0000 mg/m2 | INTRAVENOUS | Status: DC
  Administered 2014-01-18: 5000 mg via INTRAVENOUS
  Filled 2014-01-18: qty 100

## 2014-01-18 MED ORDER — PALONOSETRON HCL INJECTION 0.25 MG/5ML
0.2500 mg | Freq: Once | INTRAVENOUS | Status: AC
  Administered 2014-01-18: 0.25 mg via INTRAVENOUS

## 2014-01-18 NOTE — Telephone Encounter (Signed)
Pt confirmed labs/ov per 07/23 POF, gave pt AVS.....KJ °

## 2014-01-18 NOTE — Telephone Encounter (Signed)
Per staff message and POF I have scheduled appts. Advised scheduler of appts. JMW  

## 2014-01-18 NOTE — Progress Notes (Signed)
  [facility] OFFICE PROGRESS NOTE   Diagnosis: Colon cancer  INTERVAL HISTORY:   She returns as scheduled. She completed a fourth cycle of FOLFOX 01/04/2014. She had tingling in the hands and feet following chemotherapy. No peripheral numbness at present. She continues to have tingling in the feet. She complains of "itching "at the eyes beginning one week following the last 2 cycles of chemotherapy. No eye erythema, tearing, or vision change. No nausea or diarrhea. She is taking iron once daily.  Objective:  Vital signs in last 24 hours:  Last menstrual period 12/13/2013.    HEENT: No thrush or ulcers. Conjunctivae without erythema Resp: Lungs clear bilaterally Cardio: Regular rate and rhythm GI: No hepatomegaly, nontender Vascular: No leg edema Neuro: Mild to moderate decrease in vibratory sense at the fingertips bilaterally  Skin: Hyperpigmentation of the hands   Portacath/PICC-without erythema  Lab Results:  Lab Results  Component Value Date   WBC 3.6* 01/18/2014   HGB 9.1* 01/18/2014   HCT 29.9* 01/18/2014   MCV 74.1* 01/18/2014   PLT 164 01/18/2014   NEUTROABS 2.4 01/18/2014     Medications: I have reviewed the patient's current medications.  Assessment/Plan: 1. Stage IIC (T4b, N0), adenocarcinoma the sigmoid colon, status post a partial colectomy and en bloc bladder resection 10/20/2013, negative surgical margins, no preoperative colonoscopy. Mildly elevated preoperative CEA.  CEA normal (1.0) on 11/09/2013.  Cycle 1 adjuvant FOLFOX 11/23/2013.  Cycle 2 adjuvant FOLFOX 12/07/2013.  Cycle 3 adjuvant FOLFOX 12/21/2013 (oxaliplatin dose reduced to 65 mg per meter squared due to neuropathy symptoms). Cycle 4 adjuvant FOLFOX 01/04/2014 2. Microcytic anemia-likely iron deficiency anemia. She continues oral iron. I recommended she increase the iron to twice daily 3. Right breast nodule on the chest CT 11/14/2013.  Diagnostic mammogram and ultrasound  12/26/2013 showed a probable fibroadenoma within the inner right breast.  She discussed management options with the radiologist including excision, biopsy and short interval followup. She elected short interval followup. A followup ultrasound is recommended at 6, 12 and 24 months to assess stability. 4. Port-A-Cath placement 11/15/2013. 5. Delayed nausea following cycle 1 FOLFOX. Aloxi and Emend added with cycle 2 with improvement. 6. 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 Oxaliplatin was dose reduced to 65 mg per meter squared beginning with cycle 3. Symptoms are better. 7. Ocular pruritus. Most likely related to 5-fluorouracil   Disposition:  She has completed 4 cycles of adjuvant FOLFOX. Her overall status appears stable. The ocular pruritus is most likely related to 5-fluorouracil. She has no other ocular symptoms. We will continue to 5-fluorouracil and consider a dose reduction if the pruritus worsens. She will complete cycle 5 of FOLFOX today.  Ms. Sinopoli will return for an office visit and chemotherapy in 2 weeks.  Betsy Coder, MD  01/18/2014  9:15 AM

## 2014-01-18 NOTE — Patient Instructions (Signed)
[facility] Discharge Instructions for Patients Receiving Chemotherapy  Today you received the following chemotherapy agents oxaliplatin/leucovorin/fluorouracil  To help prevent nausea and vomiting after your treatment, we encourage you to take your nausea medication as directed If you develop nausea and vomiting that is not controlled by your nausea medication, call the clinic.   BELOW ARE SYMPTOMS THAT SHOULD BE REPORTED IMMEDIATELY:  *FEVER GREATER THAN 100.5 F  *CHILLS WITH OR WITHOUT FEVER  NAUSEA AND VOMITING THAT IS NOT CONTROLLED WITH YOUR NAUSEA MEDICATION  *UNUSUAL SHORTNESS OF BREATH  *UNUSUAL BRUISING OR BLEEDING  TENDERNESS IN MOUTH AND THROAT WITH OR WITHOUT PRESENCE OF ULCERS  *URINARY PROBLEMS  *BOWEL PROBLEMS  UNUSUAL RASH Items with * indicate a potential emergency and should be followed up as soon as possible.  Feel free to call the clinic you have any questions or concerns. The clinic phone number is [phone].  

## 2014-01-20 ENCOUNTER — Ambulatory Visit (HOSPITAL_BASED_OUTPATIENT_CLINIC_OR_DEPARTMENT_OTHER): Payer: No Typology Code available for payment source

## 2014-01-20 VITALS — BP 124/66 | HR 80 | Temp 98.6°F | Resp 18

## 2014-01-20 DIAGNOSIS — C187 Malignant neoplasm of sigmoid colon: Secondary | ICD-10-CM

## 2014-01-20 MED ORDER — SODIUM CHLORIDE 0.9 % IJ SOLN
10.0000 mL | INTRAMUSCULAR | Status: DC | PRN
  Administered 2014-01-20: 10 mL
  Filled 2014-01-20: qty 10

## 2014-01-20 MED ORDER — HEPARIN SOD (PORK) LOCK FLUSH 100 UNIT/ML IV SOLN
500.0000 [IU] | Freq: Once | INTRAVENOUS | Status: AC | PRN
  Administered 2014-01-20: 500 [IU]
  Filled 2014-01-20: qty 5

## 2014-01-20 NOTE — Patient Instructions (Signed)

## 2014-01-20 NOTE — Progress Notes (Signed)
Discharged at 12:00 with spouse.  Ambulatory in no distress.  C/O "going down" feeling tired.  Family getting together tooday, instructed to let the famiky do all the work and rest to enjoy being with them.

## 2014-01-28 ENCOUNTER — Other Ambulatory Visit: Payer: Self-pay | Admitting: Oncology

## 2014-02-01 ENCOUNTER — Telehealth: Payer: Self-pay | Admitting: Oncology

## 2014-02-01 ENCOUNTER — Other Ambulatory Visit (HOSPITAL_BASED_OUTPATIENT_CLINIC_OR_DEPARTMENT_OTHER): Payer: Self-pay

## 2014-02-01 ENCOUNTER — Other Ambulatory Visit: Payer: Self-pay | Admitting: Nurse Practitioner

## 2014-02-01 ENCOUNTER — Ambulatory Visit (HOSPITAL_BASED_OUTPATIENT_CLINIC_OR_DEPARTMENT_OTHER): Payer: No Typology Code available for payment source | Admitting: Nurse Practitioner

## 2014-02-01 ENCOUNTER — Ambulatory Visit (HOSPITAL_BASED_OUTPATIENT_CLINIC_OR_DEPARTMENT_OTHER): Payer: Self-pay

## 2014-02-01 VITALS — BP 125/72 | HR 91 | Temp 98.0°F

## 2014-02-01 VITALS — BP 124/79 | HR 76 | Temp 98.6°F | Resp 19 | Ht 61.0 in | Wt 245.2 lb

## 2014-02-01 DIAGNOSIS — C187 Malignant neoplasm of sigmoid colon: Secondary | ICD-10-CM

## 2014-02-01 DIAGNOSIS — R928 Other abnormal and inconclusive findings on diagnostic imaging of breast: Secondary | ICD-10-CM

## 2014-02-01 DIAGNOSIS — Z5111 Encounter for antineoplastic chemotherapy: Secondary | ICD-10-CM

## 2014-02-01 DIAGNOSIS — D509 Iron deficiency anemia, unspecified: Secondary | ICD-10-CM

## 2014-02-01 LAB — CBC WITH DIFFERENTIAL/PLATELET
BASO%: 1.1 % (ref 0.0–2.0)
Basophils Absolute: 0 10*3/uL (ref 0.0–0.1)
EOS%: 1.3 % (ref 0.0–7.0)
Eosinophils Absolute: 0 10*3/uL (ref 0.0–0.5)
HCT: 31.6 % — ABNORMAL LOW (ref 34.8–46.6)
HGB: 9.8 g/dL — ABNORMAL LOW (ref 11.6–15.9)
LYMPH%: 26 % (ref 14.0–49.7)
MCH: 23.5 pg — ABNORMAL LOW (ref 25.1–34.0)
MCHC: 30.9 g/dL — ABNORMAL LOW (ref 31.5–36.0)
MCV: 76.1 fL — ABNORMAL LOW (ref 79.5–101.0)
MONO#: 0.4 10*3/uL (ref 0.1–0.9)
MONO%: 12.5 % (ref 0.0–14.0)
NEUT#: 2.1 10*3/uL (ref 1.5–6.5)
NEUT%: 59.1 % (ref 38.4–76.8)
Platelets: 177 10*3/uL (ref 145–400)
RBC: 4.16 10*6/uL (ref 3.70–5.45)
RDW: 29.1 % — ABNORMAL HIGH (ref 11.2–14.5)
WBC: 3.6 10*3/uL — ABNORMAL LOW (ref 3.9–10.3)
lymph#: 0.9 10*3/uL (ref 0.9–3.3)

## 2014-02-01 LAB — COMPREHENSIVE METABOLIC PANEL (CC13)
ALT: 21 U/L (ref 0–55)
AST: 24 U/L (ref 5–34)
Albumin: 3.4 g/dL — ABNORMAL LOW (ref 3.5–5.0)
Alkaline Phosphatase: 149 U/L (ref 40–150)
Anion Gap: 7 mEq/L (ref 3–11)
BUN: 11 mg/dL (ref 7.0–26.0)
CO2: 24 mEq/L (ref 22–29)
Calcium: 9 mg/dL (ref 8.4–10.4)
Chloride: 109 mEq/L (ref 98–109)
Creatinine: 0.9 mg/dL (ref 0.6–1.1)
Glucose: 97 mg/dl (ref 70–140)
Potassium: 3.9 mEq/L (ref 3.5–5.1)
Sodium: 140 mEq/L (ref 136–145)
Total Bilirubin: 0.47 mg/dL (ref 0.20–1.20)
Total Protein: 6.9 g/dL (ref 6.4–8.3)

## 2014-02-01 MED ORDER — FLUOROURACIL CHEMO INJECTION 2.5 GM/50ML
400.0000 mg/m2 | Freq: Once | INTRAVENOUS | Status: AC
  Administered 2014-02-01: 850 mg via INTRAVENOUS
  Filled 2014-02-01: qty 17

## 2014-02-01 MED ORDER — PALONOSETRON HCL INJECTION 0.25 MG/5ML
INTRAVENOUS | Status: AC
  Filled 2014-02-01: qty 5

## 2014-02-01 MED ORDER — DEXTROSE 5 % IV SOLN
Freq: Once | INTRAVENOUS | Status: AC
  Administered 2014-02-01: 15:00:00 via INTRAVENOUS

## 2014-02-01 MED ORDER — DEXAMETHASONE SODIUM PHOSPHATE 10 MG/ML IJ SOLN
10.0000 mg | Freq: Once | INTRAMUSCULAR | Status: AC
  Administered 2014-02-01: 10 mg via INTRAVENOUS

## 2014-02-01 MED ORDER — DEXAMETHASONE SODIUM PHOSPHATE 10 MG/ML IJ SOLN
INTRAMUSCULAR | Status: AC
  Filled 2014-02-01: qty 1

## 2014-02-01 MED ORDER — SODIUM CHLORIDE 0.9 % IV SOLN
150.0000 mg | Freq: Once | INTRAVENOUS | Status: AC
  Administered 2014-02-01: 150 mg via INTRAVENOUS
  Filled 2014-02-01: qty 5

## 2014-02-01 MED ORDER — DIPHENHYDRAMINE HCL 50 MG/ML IJ SOLN
25.0000 mg | Freq: Once | INTRAMUSCULAR | Status: AC
  Administered 2014-02-01: 25 mg via INTRAVENOUS

## 2014-02-01 MED ORDER — IBUPROFEN 600 MG PO TABS: 600.0000 mg | ORAL_TABLET | Freq: Three times a day (TID) | ORAL | Status: DC | PRN

## 2014-02-01 MED ORDER — METHYLPREDNISOLONE SODIUM SUCC 125 MG IJ SOLR
60.0000 mg | Freq: Once | INTRAMUSCULAR | Status: AC
  Administered 2014-02-01: 60 mg via INTRAVENOUS

## 2014-02-01 MED ORDER — SODIUM CHLORIDE 0.9 % IV SOLN
2400.0000 mg/m2 | INTRAVENOUS | Status: DC
  Administered 2014-02-01: 5000 mg via INTRAVENOUS
  Filled 2014-02-01: qty 100

## 2014-02-01 MED ORDER — FAMOTIDINE IN NACL 20-0.9 MG/50ML-% IV SOLN
20.0000 mg | Freq: Two times a day (BID) | INTRAVENOUS | Status: DC
  Administered 2014-02-01: 20 mg via INTRAVENOUS

## 2014-02-01 MED ORDER — PALONOSETRON HCL INJECTION 0.25 MG/5ML
0.2500 mg | Freq: Once | INTRAVENOUS | Status: AC
  Administered 2014-02-01: 0.25 mg via INTRAVENOUS

## 2014-02-01 MED ORDER — OXALIPLATIN CHEMO INJECTION 100 MG/20ML
65.0000 mg/m2 | Freq: Once | INTRAVENOUS | Status: AC
  Administered 2014-02-01: 135 mg via INTRAVENOUS
  Filled 2014-02-01: qty 27

## 2014-02-01 MED ORDER — DEXAMETHASONE 4 MG PO TABS: ORAL_TABLET | ORAL | Status: DC

## 2014-02-01 MED ORDER — LEUCOVORIN CALCIUM INJECTION 350 MG
401.0000 mg/m2 | Freq: Once | INTRAVENOUS | Status: AC
  Administered 2014-02-01: 850 mg via INTRAVENOUS
  Filled 2014-02-01: qty 42.5

## 2014-02-01 NOTE — Progress Notes (Signed)
  [place] OFFICE PROGRESS NOTE   Diagnosis:  Colon cancer.  INTERVAL HISTORY:   Amy Bentley returns as scheduled. She completed cycle 5 FOLFOX on 01/18/2014. She denies nausea/vomiting. She developed a single mouth sore which has resolved. No diarrhea. She has mild persistent cold sensitivity. No numbness or tingling in the absence of cold exposure. She reports her energy level is better. She has a good appetite. She is no longer experiencing ocular pruritus.  Objective:  Vital signs in last 24 hours:  Blood pressure 124/79, pulse 76, temperature 98.6 F (37 C), temperature source Oral, resp. rate 19, height 5\' 1"  (1.549 m), weight 245 lb 3.2 oz (111.222 kg), SpO2 100.00%.    HEENT: No thrush or ulcerations. Resp: Lungs clear. Cardio: Regular rate and rhythm. GI: Abdomen soft and nontender. No hepatomegaly. Vascular: No leg edema. Calves soft and nontender. Neuro: Mild to moderate decrease in vibratory sense over the fingertips bilaterally per tuning fork exam.  Skin: Palms with hyperpigmentation.  Port-A-Cath site without erythema.    Lab Results:  Lab Results  Component Value Date   WBC 3.6* 02/01/2014   HGB 9.8* 02/01/2014   HCT 31.6* 02/01/2014   MCV 76.1* 02/01/2014   PLT 177 02/01/2014   NEUTROABS 2.1 02/01/2014    Imaging:  No results found.  Medications: I have reviewed the patient's current medications.  Assessment/Plan: 1. Stage IIC (T4b, N0), adenocarcinoma the sigmoid colon, status post a partial colectomy and en bloc bladder resection 10/20/2013, negative surgical margins, no preoperative colonoscopy. Mildly elevated preoperative CEA.  CEA normal (1.0) on 11/09/2013.  Cycle 1 adjuvant FOLFOX 11/23/2013.  Cycle 2 adjuvant FOLFOX 12/07/2013.  Cycle 3 adjuvant FOLFOX 12/21/2013 (oxaliplatin dose reduced to 65 mg per meter squared due to neuropathy symptoms).  Cycle 4 adjuvant FOLFOX 01/04/2014. Cycle 5 adjuvant FOLFOX 01/18/2014. 2. Microcytic  anemia-likely iron deficiency anemia. She continues oral iron. 3. Right breast nodule on the chest CT 11/14/2013.  Diagnostic mammogram and ultrasound 12/26/2013 showed a probable fibroadenoma within the inner right breast.  She discussed management options with the radiologist including excision, biopsy and short interval followup. She elected short interval followup. A followup ultrasound is recommended at 6, 12 and 24 months to assess stability. 4. Port-A-Cath placement 11/15/2013. 5. Delayed nausea following cycle 1 FOLFOX. Aloxi and Emend added with cycle 2 with improvement. 6. 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 Oxaliplatin was dose reduced to 65 mg per meter squared beginning with cycle 3. Symptoms are better. 7. Ocular pruritus. Most likely related to 5-fluorouracil. She did not experience ocular pruritus following cycle 5 FOLFOX.   Disposition: Amy Bentley appears stable. She has completed 5 cycles of adjuvant FOLFOX. Plan to proceed with cycle 6 today as scheduled. She will return for a followup visit and cycle 7 in 2 weeks. She will contact the office in the interim with any problems.  Plan reviewed with Dr. Benay Spice.    Ned Card ANP/GNP-BC   02/01/2014  1:29 PM

## 2014-02-01 NOTE — Progress Notes (Signed)
@   approx. 1422 patient c/o feeling very hot, diaphoretic, short of breath, nauseated and c/o cramping pain right mid and lower abdominal area. Pt. rated pain at a '10'.  VS taken, recorded. Chemo stopped and D5W infusion started wide open.  Dr. Benay Spice and Ned Card, NP notified stat.  Both at patient's bedside within a couple of minutes. Per MD/NP assessment of possible hyper-sensitivity reaction-received orders for Benadryl 25mg  IVP and Pepcid 20mg  IV over 15 minutes,  continue to hold chemo at this time. VS continued to be monitored and stable.  S02 @100 % on room air.  Within 5 minutes of IV Benadryl patient resting, no longer diaphoretic.  1500  Patient continues to rest, rates pain at '4'. Denies nausea, shortness of breath.  VSS  IV's continue with D5W at [facility].  Patient requested that we call her mother to come to cancer center.  Spoke with patient's mother, explained situation. Mother on her way to [place].  1515  VSS  1522  Patient c/o shaking chills, requesting more blankets. Very obviously shaking chills. Afebrile.  Dr. Benay Spice notified. Received order for Solumedrol 60mg  IVP. 1530  VSS  No chills at this time. Denies pain, nausea.  Pt's mother at bedside. 1545  VSS. Patient states she feels 'pretty good' now and back to baseline.  IV remains at [place] Per Dr. Benay Spice- resume Leucovorin only. Hold Oxaliplatin today.

## 2014-02-01 NOTE — Telephone Encounter (Signed)
GV AND PRINTED APPT SCHED ADN AVS FOR PT FOR aUG AND SEPT...SED ADDED TX.

## 2014-02-01 NOTE — Patient Instructions (Signed)
[place] Discharge Instructions for Patients Receiving Chemotherapy  Today you received the following chemotherapy agents: Oxaliplatin, Leukovorin, 5FU and pump (5FU)  To help prevent nausea and vomiting after your treatment, we encourage you to take your nausea medication: Compazine 10mg  every 6 hours as needed.   If you develop nausea and vomiting that is not controlled by your nausea medication, call the clinic.   BELOW ARE SYMPTOMS THAT SHOULD BE REPORTED IMMEDIATELY:  *FEVER GREATER THAN 100.5 F  *CHILLS WITH OR WITHOUT FEVER  NAUSEA AND VOMITING THAT IS NOT CONTROLLED WITH YOUR NAUSEA MEDICATION  *UNUSUAL SHORTNESS OF BREATH  *UNUSUAL BRUISING OR BLEEDING  TENDERNESS IN MOUTH AND THROAT WITH OR WITHOUT PRESENCE OF ULCERS  *URINARY PROBLEMS  *BOWEL PROBLEMS  UNUSUAL RASH Items with * indicate a potential emergency and should be followed up as soon as possible.  Feel free to call the clinic you have any questions or concerns. The clinic phone number is [phone].

## 2014-02-03 ENCOUNTER — Ambulatory Visit (HOSPITAL_BASED_OUTPATIENT_CLINIC_OR_DEPARTMENT_OTHER): Payer: No Typology Code available for payment source

## 2014-02-03 VITALS — BP 110/65 | HR 68 | Temp 98.7°F | Resp 18

## 2014-02-03 DIAGNOSIS — C187 Malignant neoplasm of sigmoid colon: Secondary | ICD-10-CM

## 2014-02-03 MED ORDER — SODIUM CHLORIDE 0.9 % IJ SOLN
10.0000 mL | INTRAMUSCULAR | Status: DC | PRN
  Administered 2014-02-03: 10 mL
  Filled 2014-02-03: qty 10

## 2014-02-03 MED ORDER — HEPARIN SOD (PORK) LOCK FLUSH 100 UNIT/ML IV SOLN
500.0000 [IU] | Freq: Once | INTRAVENOUS | Status: AC | PRN
  Administered 2014-02-03: 500 [IU]
  Filled 2014-02-03: qty 5

## 2014-02-07 ENCOUNTER — Other Ambulatory Visit: Payer: Self-pay | Admitting: Nurse Practitioner

## 2014-02-11 ENCOUNTER — Other Ambulatory Visit: Payer: Self-pay | Admitting: Oncology

## 2014-02-15 ENCOUNTER — Ambulatory Visit (HOSPITAL_BASED_OUTPATIENT_CLINIC_OR_DEPARTMENT_OTHER): Payer: Self-pay

## 2014-02-15 ENCOUNTER — Other Ambulatory Visit (HOSPITAL_BASED_OUTPATIENT_CLINIC_OR_DEPARTMENT_OTHER): Payer: No Typology Code available for payment source

## 2014-02-15 ENCOUNTER — Telehealth: Payer: Self-pay | Admitting: Oncology

## 2014-02-15 ENCOUNTER — Other Ambulatory Visit: Payer: Self-pay | Admitting: *Deleted

## 2014-02-15 ENCOUNTER — Ambulatory Visit (HOSPITAL_BASED_OUTPATIENT_CLINIC_OR_DEPARTMENT_OTHER): Payer: No Typology Code available for payment source | Admitting: Oncology

## 2014-02-15 VITALS — BP 118/80 | HR 70 | Temp 98.1°F | Resp 19 | Ht 61.0 in | Wt 250.1 lb

## 2014-02-15 DIAGNOSIS — C187 Malignant neoplasm of sigmoid colon: Secondary | ICD-10-CM

## 2014-02-15 DIAGNOSIS — Z5111 Encounter for antineoplastic chemotherapy: Secondary | ICD-10-CM

## 2014-02-15 DIAGNOSIS — D509 Iron deficiency anemia, unspecified: Secondary | ICD-10-CM

## 2014-02-15 DIAGNOSIS — G620 Drug-induced polyneuropathy: Secondary | ICD-10-CM

## 2014-02-15 LAB — CBC WITH DIFFERENTIAL/PLATELET
BASO%: 0.1 % (ref 0.0–2.0)
Basophils Absolute: 0 10*3/uL (ref 0.0–0.1)
EOS%: 0 % (ref 0.0–7.0)
Eosinophils Absolute: 0 10*3/uL (ref 0.0–0.5)
HCT: 34.3 % — ABNORMAL LOW (ref 34.8–46.6)
HGB: 10.7 g/dL — ABNORMAL LOW (ref 11.6–15.9)
LYMPH%: 5.2 % — ABNORMAL LOW (ref 14.0–49.7)
MCH: 24.2 pg — ABNORMAL LOW (ref 25.1–34.0)
MCHC: 31.1 g/dL — ABNORMAL LOW (ref 31.5–36.0)
MCV: 77.7 fL — ABNORMAL LOW (ref 79.5–101.0)
MONO#: 0.2 10*3/uL (ref 0.1–0.9)
MONO%: 2 % (ref 0.0–14.0)
NEUT#: 8.9 10*3/uL — ABNORMAL HIGH (ref 1.5–6.5)
NEUT%: 92.7 % — ABNORMAL HIGH (ref 38.4–76.8)
Platelets: 220 10*3/uL (ref 145–400)
RBC: 4.41 10*6/uL (ref 3.70–5.45)
RDW: 29.3 % — ABNORMAL HIGH (ref 11.2–14.5)
WBC: 9.6 10*3/uL (ref 3.9–10.3)
lymph#: 0.5 10*3/uL — ABNORMAL LOW (ref 0.9–3.3)

## 2014-02-15 LAB — COMPREHENSIVE METABOLIC PANEL (CC13)
ALT: 28 U/L (ref 0–55)
AST: 22 U/L (ref 5–34)
Albumin: 3.5 g/dL (ref 3.5–5.0)
Alkaline Phosphatase: 155 U/L — ABNORMAL HIGH (ref 40–150)
Anion Gap: 11 mEq/L (ref 3–11)
BUN: 16.5 mg/dL (ref 7.0–26.0)
CO2: 20 mEq/L — ABNORMAL LOW (ref 22–29)
Calcium: 9.4 mg/dL (ref 8.4–10.4)
Chloride: 108 mEq/L (ref 98–109)
Creatinine: 0.9 mg/dL (ref 0.6–1.1)
Glucose: 151 mg/dl — ABNORMAL HIGH (ref 70–140)
Potassium: 3.6 mEq/L (ref 3.5–5.1)
Sodium: 139 mEq/L (ref 136–145)
Total Bilirubin: 0.24 mg/dL (ref 0.20–1.20)
Total Protein: 7.3 g/dL (ref 6.4–8.3)

## 2014-02-15 MED ORDER — SODIUM CHLORIDE 0.9 % IV SOLN
150.0000 mg | Freq: Once | INTRAVENOUS | Status: AC
  Administered 2014-02-15: 150 mg via INTRAVENOUS
  Filled 2014-02-15: qty 5

## 2014-02-15 MED ORDER — DIPHENHYDRAMINE HCL 50 MG/ML IJ SOLN
25.0000 mg | Freq: Once | INTRAMUSCULAR | Status: AC
  Administered 2014-02-15: 25 mg via INTRAVENOUS

## 2014-02-15 MED ORDER — SODIUM CHLORIDE 0.9 % IV SOLN
2400.0000 mg/m2 | INTRAVENOUS | Status: DC
  Administered 2014-02-15: 5000 mg via INTRAVENOUS
  Filled 2014-02-15: qty 100

## 2014-02-15 MED ORDER — FAMOTIDINE IN NACL 20-0.9 MG/50ML-% IV SOLN
INTRAVENOUS | Status: AC
  Filled 2014-02-15: qty 50

## 2014-02-15 MED ORDER — FLUOROURACIL CHEMO INJECTION 2.5 GM/50ML
400.0000 mg/m2 | Freq: Once | INTRAVENOUS | Status: AC
  Administered 2014-02-15: 850 mg via INTRAVENOUS
  Filled 2014-02-15: qty 17

## 2014-02-15 MED ORDER — FAMOTIDINE IN NACL 20-0.9 MG/50ML-% IV SOLN
20.0000 mg | Freq: Once | INTRAVENOUS | Status: AC
  Administered 2014-02-15: 20 mg via INTRAVENOUS

## 2014-02-15 MED ORDER — METHYLPREDNISOLONE SODIUM SUCC 125 MG IJ SOLR
125.0000 mg | Freq: Once | INTRAMUSCULAR | Status: AC
  Administered 2014-02-15: 125 mg via INTRAVENOUS

## 2014-02-15 MED ORDER — OXALIPLATIN CHEMO INJECTION 100 MG/20ML
65.0000 mg/m2 | Freq: Once | INTRAVENOUS | Status: AC
  Administered 2014-02-15: 135 mg via INTRAVENOUS
  Filled 2014-02-15: qty 27

## 2014-02-15 MED ORDER — METHYLPREDNISOLONE SODIUM SUCC 125 MG IJ SOLR
INTRAMUSCULAR | Status: AC
  Filled 2014-02-15: qty 2

## 2014-02-15 MED ORDER — LEUCOVORIN CALCIUM INJECTION 350 MG
401.0000 mg/m2 | Freq: Once | INTRAMUSCULAR | Status: AC
  Administered 2014-02-15: 850 mg via INTRAVENOUS
  Filled 2014-02-15: qty 42.5

## 2014-02-15 MED ORDER — PALONOSETRON HCL INJECTION 0.25 MG/5ML
INTRAVENOUS | Status: AC
Start: 2014-02-15 — End: 2014-02-15
  Filled 2014-02-15: qty 5

## 2014-02-15 MED ORDER — DEXTROSE 5 % IV SOLN
Freq: Once | INTRAVENOUS | Status: AC
  Administered 2014-02-15: 13:00:00 via INTRAVENOUS

## 2014-02-15 MED ORDER — PALONOSETRON HCL INJECTION 0.25 MG/5ML
0.2500 mg | Freq: Once | INTRAVENOUS | Status: AC
  Administered 2014-02-15: 0.25 mg via INTRAVENOUS

## 2014-02-15 MED ORDER — DEXAMETHASONE 4 MG PO TABS: ORAL_TABLET | ORAL | Status: DC

## 2014-02-15 MED ORDER — DIPHENHYDRAMINE HCL 50 MG/ML IJ SOLN
INTRAMUSCULAR | Status: AC
  Filled 2014-02-15: qty 1

## 2014-02-15 NOTE — Progress Notes (Signed)
  [place] OFFICE PROGRESS NOTE   Diagnosis: Colon cancer  INTERVAL HISTORY:   Ms. Hagarty returns as scheduled. She was treated with another cycle of FOLFOX on 02/01/2014. During the oxaliplatin infusion she became diaphoretic and developed cramping abdominal pain. The oxaliplatin was discontinued and she was treated with Benadryl and Pepcid. Her symptoms improved. She later developed "chills "and was treated with Solu-Medrol.  She reports increased malaise following this cycle of chemotherapy. She otherwise feels well. No cold sensitivity following this cycle of chemotherapy. No neuropathy symptoms at present.  She took Decadron premedication yesterday and reports feeling anxious at the Decadron.  Objective:  Vital signs in last 24 hours:  Blood pressure 118/80, pulse 70, temperature 98.1 F (36.7 C), temperature source Oral, resp. rate 19, height 5\' 1"  (1.549 m), weight 250 lb 1.6 oz (113.445 kg).    HEENT: No thrush or ulcers Resp: Lungs clear bilaterally Cardio: Regular rate and rhythm GI: No hepatomegaly, nontender Vascular: No leg edema Neuro: Moderate loss of vibratory sense at the fingertips bilaterally  Skin: No rash   Portacath/PICC-without erythema  Lab Results:  Lab Results  Component Value Date   WBC 9.6 02/15/2014   HGB 10.7* 02/15/2014   HCT 34.3* 02/15/2014   MCV 77.7* 02/15/2014   PLT 220 02/15/2014   NEUTROABS 8.9* 02/15/2014     Medications: I have reviewed the patient's current medications.  Assessment/Plan: 1. Stage IIC (T4b, N0), adenocarcinoma the sigmoid colon, status post a partial colectomy and en bloc bladder resection 10/20/2013, negative surgical margins, no preoperative colonoscopy. Mildly elevated preoperative CEA.  CEA normal (1.0) on 11/09/2013.  Cycle 1 adjuvant FOLFOX 11/23/2013.  Cycle 2 adjuvant FOLFOX 12/07/2013.  Cycle 3 adjuvant FOLFOX 12/21/2013 (oxaliplatin dose reduced to 65 mg per meter squared due to  neuropathy symptoms).  Cycle 4 adjuvant FOLFOX 01/04/2014.  Cycle 5 adjuvant FOLFOX 01/18/2014. Cycle 6 adjuvant FOLFOX 02/01/2014-oxaliplatin discontinued prematurely secondary to an apparent hypersensitivity reaction 2. Microcytic anemia-likely iron deficiency anemia. She continues oral iron. 3. Right breast nodule on the chest CT 11/14/2013.  Diagnostic mammogram and ultrasound 12/26/2013 showed a probable fibroadenoma within the inner right breast.  She discussed management options with the radiologist including excision, biopsy and short interval followup. She elected short interval followup. A followup ultrasound is recommended at 6, 12 and 24 months to assess stability. 4. Port-A-Cath placement 11/15/2013. 5. Delayed nausea following cycle 1 FOLFOX. Aloxi and Emend added with cycle 2 with improvement. 6. 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 Oxaliplatin was dose reduced to 65 mg per meter squared beginning with cycle 3. Symptoms are better. 7. Ocular pruritus. Most likely related to 5-fluorouracil. She did not experience ocular pruritus following cycle 5 FOLFOX. 8. Hypersensitivity reaction to oxaliplatin with cycle 6 FOLFOX-she was premedicated with Decadron and additional steroid/antihistamine premedication will be given with chemotherapy today.    Disposition:  She appears stable. The plan is to proceed with cycle 7 adjuvant FOLFOX today. She understands the potential of developing a recurrent hypersensitivity reaction with oxaliplatin. She has been premedicated accordingly and the oxaliplatin will be given at a 4 hour rate.  Amy Bentley will return for an office visit and cycle 8 FOLFOX in 2 weeks.  Betsy Coder, MD  02/15/2014  10:25 AM

## 2014-02-15 NOTE — Telephone Encounter (Signed)
gv and printed appt sched and avs for pt for Aug and Sept.....sed added tx. °

## 2014-02-15 NOTE — Patient Instructions (Signed)
[facility] Discharge Instructions for Patients Receiving Chemotherapy  Today you received the following chemotherapy agents Oxaliplatin, Leucovorin and 5FU.  To help prevent nausea and vomiting after your treatment, we encourage you to take your nausea medication.   If you develop nausea and vomiting that is not controlled by your nausea medication, call the clinic.   BELOW ARE SYMPTOMS THAT SHOULD BE REPORTED IMMEDIATELY:  *FEVER GREATER THAN 100.5 F  *CHILLS WITH OR WITHOUT FEVER  NAUSEA AND VOMITING THAT IS NOT CONTROLLED WITH YOUR NAUSEA MEDICATION  *UNUSUAL SHORTNESS OF BREATH  *UNUSUAL BRUISING OR BLEEDING  TENDERNESS IN MOUTH AND THROAT WITH OR WITHOUT PRESENCE OF ULCERS  *URINARY PROBLEMS  *BOWEL PROBLEMS  UNUSUAL RASH Items with * indicate a potential emergency and should be followed up as soon as possible.  Feel free to call the clinic you have any questions or concerns. The clinic phone number is [phone].    

## 2014-02-17 ENCOUNTER — Ambulatory Visit (HOSPITAL_BASED_OUTPATIENT_CLINIC_OR_DEPARTMENT_OTHER): Payer: No Typology Code available for payment source

## 2014-02-17 VITALS — BP 126/72 | HR 83 | Temp 99.0°F

## 2014-02-17 DIAGNOSIS — C187 Malignant neoplasm of sigmoid colon: Secondary | ICD-10-CM

## 2014-02-17 MED ORDER — SODIUM CHLORIDE 0.9 % IJ SOLN
10.0000 mL | INTRAMUSCULAR | Status: DC | PRN
  Administered 2014-02-17: 10 mL
  Filled 2014-02-17: qty 10

## 2014-02-17 MED ORDER — HEPARIN SOD (PORK) LOCK FLUSH 100 UNIT/ML IV SOLN
500.0000 [IU] | Freq: Once | INTRAVENOUS | Status: AC | PRN
  Administered 2014-02-17: 500 [IU]
  Filled 2014-02-17: qty 5

## 2014-02-17 NOTE — Progress Notes (Signed)
Reports has had some light brown vaginal discharge over past 24 hours. No odor or itching. Last menstrual period one week ago. Instructed her to call if it is persistent or any changes. May be related to chemo effect on her menstrual cycle.

## 2014-03-01 ENCOUNTER — Other Ambulatory Visit: Payer: Self-pay | Admitting: Internal Medicine

## 2014-03-01 ENCOUNTER — Other Ambulatory Visit (HOSPITAL_BASED_OUTPATIENT_CLINIC_OR_DEPARTMENT_OTHER): Payer: No Typology Code available for payment source

## 2014-03-01 ENCOUNTER — Telehealth: Payer: Self-pay | Admitting: *Deleted

## 2014-03-01 ENCOUNTER — Ambulatory Visit (HOSPITAL_BASED_OUTPATIENT_CLINIC_OR_DEPARTMENT_OTHER): Payer: No Typology Code available for payment source

## 2014-03-01 ENCOUNTER — Telehealth: Payer: Self-pay | Admitting: Nurse Practitioner

## 2014-03-01 ENCOUNTER — Ambulatory Visit (HOSPITAL_BASED_OUTPATIENT_CLINIC_OR_DEPARTMENT_OTHER): Payer: No Typology Code available for payment source | Admitting: Nurse Practitioner

## 2014-03-01 VITALS — BP 148/75 | HR 74 | Temp 98.2°F | Resp 20 | Ht 61.0 in | Wt 256.0 lb

## 2014-03-01 DIAGNOSIS — D509 Iron deficiency anemia, unspecified: Secondary | ICD-10-CM

## 2014-03-01 DIAGNOSIS — Z5111 Encounter for antineoplastic chemotherapy: Secondary | ICD-10-CM

## 2014-03-01 DIAGNOSIS — C187 Malignant neoplasm of sigmoid colon: Secondary | ICD-10-CM

## 2014-03-01 DIAGNOSIS — Z23 Encounter for immunization: Secondary | ICD-10-CM

## 2014-03-01 LAB — CBC WITH DIFFERENTIAL/PLATELET
BASO%: 0.1 % (ref 0.0–2.0)
Basophils Absolute: 0 10*3/uL (ref 0.0–0.1)
EOS%: 0 % (ref 0.0–7.0)
Eosinophils Absolute: 0 10*3/uL (ref 0.0–0.5)
HCT: 33.7 % — ABNORMAL LOW (ref 34.8–46.6)
HGB: 10.5 g/dL — ABNORMAL LOW (ref 11.6–15.9)
LYMPH%: 5.5 % — ABNORMAL LOW (ref 14.0–49.7)
MCH: 25 pg — ABNORMAL LOW (ref 25.1–34.0)
MCHC: 31.3 g/dL — ABNORMAL LOW (ref 31.5–36.0)
MCV: 79.8 fL (ref 79.5–101.0)
MONO#: 0.3 10*3/uL (ref 0.1–0.9)
MONO%: 3.3 % (ref 0.0–14.0)
NEUT#: 8.6 10*3/uL — ABNORMAL HIGH (ref 1.5–6.5)
NEUT%: 91.1 % — ABNORMAL HIGH (ref 38.4–76.8)
Platelets: 178 10*3/uL (ref 145–400)
RBC: 4.22 10*6/uL (ref 3.70–5.45)
RDW: 28 % — ABNORMAL HIGH (ref 11.2–14.5)
WBC: 9.4 10*3/uL (ref 3.9–10.3)
lymph#: 0.5 10*3/uL — ABNORMAL LOW (ref 0.9–3.3)

## 2014-03-01 LAB — COMPREHENSIVE METABOLIC PANEL (CC13)
ALT: 16 U/L (ref 0–55)
AST: 16 U/L (ref 5–34)
Albumin: 3.3 g/dL — ABNORMAL LOW (ref 3.5–5.0)
Alkaline Phosphatase: 133 U/L (ref 40–150)
Anion Gap: 8 mEq/L (ref 3–11)
BUN: 15.7 mg/dL (ref 7.0–26.0)
CO2: 21 mEq/L — ABNORMAL LOW (ref 22–29)
Calcium: 8.9 mg/dL (ref 8.4–10.4)
Chloride: 111 mEq/L — ABNORMAL HIGH (ref 98–109)
Creatinine: 1 mg/dL (ref 0.6–1.1)
Glucose: 174 mg/dl — ABNORMAL HIGH (ref 70–140)
Potassium: 3.8 mEq/L (ref 3.5–5.1)
Sodium: 139 mEq/L (ref 136–145)
Total Bilirubin: 0.23 mg/dL (ref 0.20–1.20)
Total Protein: 7 g/dL (ref 6.4–8.3)

## 2014-03-01 MED ORDER — LEUCOVORIN CALCIUM INJECTION 350 MG
401.0000 mg/m2 | Freq: Once | INTRAVENOUS | Status: AC
  Administered 2014-03-01: 850 mg via INTRAVENOUS
  Filled 2014-03-01: qty 42.5

## 2014-03-01 MED ORDER — SODIUM CHLORIDE 0.9 % IV SOLN
2400.0000 mg/m2 | INTRAVENOUS | Status: DC
  Filled 2014-03-01: qty 100

## 2014-03-01 MED ORDER — FAMOTIDINE IN NACL 20-0.9 MG/50ML-% IV SOLN
INTRAVENOUS | Status: AC
  Filled 2014-03-01: qty 50

## 2014-03-01 MED ORDER — SODIUM CHLORIDE 0.9 % IV SOLN
150.0000 mg | Freq: Once | INTRAVENOUS | Status: AC
  Administered 2014-03-01: 150 mg via INTRAVENOUS
  Filled 2014-03-01: qty 5

## 2014-03-01 MED ORDER — DEXTROSE 5 % IV SOLN
Freq: Once | INTRAVENOUS | Status: AC
  Administered 2014-03-01: 13:00:00 via INTRAVENOUS

## 2014-03-01 MED ORDER — FAMOTIDINE IN NACL 20-0.9 MG/50ML-% IV SOLN
20.0000 mg | Freq: Once | INTRAVENOUS | Status: AC
  Administered 2014-03-01: 20 mg via INTRAVENOUS

## 2014-03-01 MED ORDER — FLUOROURACIL CHEMO INJECTION 2.5 GM/50ML
400.0000 mg/m2 | Freq: Once | INTRAVENOUS | Status: AC
  Administered 2014-03-01: 850 mg via INTRAVENOUS
  Filled 2014-03-01: qty 17

## 2014-03-01 MED ORDER — DIPHENHYDRAMINE HCL 50 MG/ML IJ SOLN
INTRAMUSCULAR | Status: AC
Start: 2014-03-01 — End: 2014-03-01
  Filled 2014-03-01: qty 1

## 2014-03-01 MED ORDER — PALONOSETRON HCL INJECTION 0.25 MG/5ML
0.2500 mg | Freq: Once | INTRAVENOUS | Status: AC
  Administered 2014-03-01: 0.25 mg via INTRAVENOUS

## 2014-03-01 MED ORDER — DIPHENHYDRAMINE HCL 50 MG/ML IJ SOLN
25.0000 mg | Freq: Once | INTRAMUSCULAR | Status: AC
  Administered 2014-03-01: 25 mg via INTRAVENOUS

## 2014-03-01 MED ORDER — PALONOSETRON HCL INJECTION 0.25 MG/5ML
INTRAVENOUS | Status: AC
  Filled 2014-03-01: qty 5

## 2014-03-01 MED ORDER — DEXTROSE 5 % IV SOLN
65.0000 mg/m2 | Freq: Once | INTRAVENOUS | Status: AC
  Administered 2014-03-01: 135 mg via INTRAVENOUS
  Filled 2014-03-01: qty 27

## 2014-03-01 MED ORDER — METHYLPREDNISOLONE SODIUM SUCC 125 MG IJ SOLR
125.0000 mg | Freq: Once | INTRAMUSCULAR | Status: AC
  Administered 2014-03-01: 125 mg via INTRAVENOUS

## 2014-03-01 MED ORDER — METHYLPREDNISOLONE SODIUM SUCC 125 MG IJ SOLR
INTRAMUSCULAR | Status: AC
  Filled 2014-03-01: qty 2

## 2014-03-01 MED ORDER — SODIUM CHLORIDE 0.9 % IV SOLN
2400.0000 mg/m2 | INTRAVENOUS | Status: DC
  Administered 2014-03-01: 5000 mg via INTRAVENOUS
  Filled 2014-03-01: qty 100

## 2014-03-01 MED ORDER — INFLUENZA VAC SPLIT QUAD 0.5 ML IM SUSY
0.5000 mL | PREFILLED_SYRINGE | Freq: Once | INTRAMUSCULAR | Status: DC
  Filled 2014-03-01: qty 0.5

## 2014-03-01 NOTE — Telephone Encounter (Signed)
, °

## 2014-03-01 NOTE — Patient Instructions (Signed)
[place] Discharge Instructions for Patients Receiving Chemotherapy  Today you received the following chemotherapy agents: Oxaliplatin, Leukovorin, 5FU and pump (5FU)  To help prevent nausea and vomiting after your treatment, we encourage you to take your nausea medication: Compazine 10mg  every 6 hours as needed.   If you develop nausea and vomiting that is not controlled by your nausea medication, call the clinic.   BELOW ARE SYMPTOMS THAT SHOULD BE REPORTED IMMEDIATELY:  *FEVER GREATER THAN 100.5 F  *CHILLS WITH OR WITHOUT FEVER  NAUSEA AND VOMITING THAT IS NOT CONTROLLED WITH YOUR NAUSEA MEDICATION  *UNUSUAL SHORTNESS OF BREATH  *UNUSUAL BRUISING OR BLEEDING  TENDERNESS IN MOUTH AND THROAT WITH OR WITHOUT PRESENCE OF ULCERS  *URINARY PROBLEMS  *BOWEL PROBLEMS  UNUSUAL RASH Items with * indicate a potential emergency and should be followed up as soon as possible.  Feel free to call the clinic you have any questions or concerns. The clinic phone number is [phone].

## 2014-03-01 NOTE — Progress Notes (Signed)
[facility] OFFICE PROGRESS NOTE   Diagnosis:  Colon cancer.  INTERVAL HISTORY:   Amy Bentley returns as scheduled. She completed cycle 7 FOLFOX on 02/15/2014. She denies nausea/vomiting. No mouth sores. No diarrhea. She notes intermittent swelling of her feet. She has pain at the left heel at times. She intermittently notes numbness of the toes on the left foot. No numbness or tingling in the right foot or hands. She denies signs/symptoms of a hypersensitivity reaction with the most recent cycle of chemotherapy.  Objective:  Vital signs in last 24 hours:  Blood pressure 148/75, pulse 74, temperature 98.2 F (36.8 C), temperature source Oral, resp. rate 20, height _0  (1.549 m), weight 256 lb (116.121 kg), SpO2 100.00%.    HEENT: No thrush or ulcers. Resp: Lungs clear bilaterally. Cardio: Regular rate and rhythm. GI: Abdomen soft and nontender. No hepatomegaly. Vascular: No leg edema. Neuro: Vibratory sense moderately decreased over the fingertips and toes bilaterally. Skin: Palms and soles without erythema.  Port-A-Cath site without erythema.    Lab Results:  Lab Results  Component Value Date   WBC 9.4 03/01/2014   HGB 10.5* 03/01/2014   HCT 33.7* 03/01/2014   MCV 79.8 03/01/2014   PLT 178 03/01/2014   NEUTROABS 8.6* 03/01/2014    Imaging:  No results found.  Medications: I have reviewed the patient's current medications.  Assessment/Plan: 1. Stage IIC (T4b, N0), adenocarcinoma the sigmoid colon, status post a partial colectomy and en bloc bladder resection 10/20/2013, negative surgical margins, no preoperative colonoscopy. No loss of mismatch repair protein expression. Microsatellite stable. Mildly elevated preoperative CEA.  CEA normal (1.0) on 11/09/2013.  Cycle 1 adjuvant FOLFOX 11/23/2013.  Cycle 2 adjuvant FOLFOX 12/07/2013.  Cycle 3 adjuvant FOLFOX 12/21/2013 (oxaliplatin dose reduced to 65 mg per meter squared due to neuropathy symptoms).  Cycle  4 adjuvant FOLFOX 01/04/2014.  Cycle 5 adjuvant FOLFOX 01/18/2014.  Cycle 6 adjuvant FOLFOX 02/01/2014-oxaliplatin discontinued prematurely secondary to an apparent hypersensitivity reaction. Cycle 7 of adjuvant FOLFOX 02/15/2014. 2. Microcytic anemia-likely iron deficiency anemia. She continues oral iron. 3. Right breast nodule on the chest CT 11/14/2013.  Diagnostic mammogram and ultrasound 12/26/2013 showed a probable fibroadenoma within the inner right breast.  She discussed management options with the radiologist including excision, biopsy and short interval followup. She elected short interval followup. A followup ultrasound is recommended at 6, 12 and 24 months to assess stability. 4. Port-A-Cath placement 11/15/2013. 5. Delayed nausea following cycle 1 FOLFOX. Aloxi and Emend added with cycle 2 with improvement. 6. 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 Oxaliplatin was dose reduced to 65 mg per meter squared beginning with cycle 3. Symptoms are better. 7. Ocular pruritus. Most likely related to 5-fluorouracil. She did not experience ocular pruritus following cycle 5 FOLFOX. 8. Hypersensitivity reaction to oxaliplatin with cycle 6 FOLFOX-she was premedicated with Decadron and given additional steroid/antihistamine premedication with cycle 7 FOLFOX with no reaction.    Disposition: Amy Bentley appears stable. She has completed 7 cycles of adjuvant FOLFOX. Plan to proceed with cycle 8 today as scheduled.  We reviewed her family history at today's visit. A paternal aunt was diagnosed with colon cancer at age 50. Amy Bentley's father was diagnosed with prostate cancer at age 57. Maternal aunt, nonsmoker, with lung cancer. Her brother died at age 69 from [place]. She is interested in meeting with the genetics counselor. We made a referral.  She will return for  a followup visit and cycle 9 FOLFOX in 2 weeks.  She will contact the office in the interim with any problems.    Ned Card ANP/GNP-BC   03/01/2014  1:35 PM

## 2014-03-01 NOTE — Telephone Encounter (Signed)
Per staff message and POF I have scheduled appts. Advised scheduler of appts. JMW  

## 2014-03-03 ENCOUNTER — Ambulatory Visit (HOSPITAL_BASED_OUTPATIENT_CLINIC_OR_DEPARTMENT_OTHER): Payer: Self-pay

## 2014-03-03 VITALS — BP 122/61 | HR 82 | Temp 98.5°F

## 2014-03-03 DIAGNOSIS — Z452 Encounter for adjustment and management of vascular access device: Secondary | ICD-10-CM

## 2014-03-03 DIAGNOSIS — C187 Malignant neoplasm of sigmoid colon: Secondary | ICD-10-CM

## 2014-03-03 MED ORDER — SODIUM CHLORIDE 0.9 % IJ SOLN
10.0000 mL | INTRAMUSCULAR | Status: DC | PRN
  Administered 2014-03-03: 10 mL
  Filled 2014-03-03: qty 10

## 2014-03-03 MED ORDER — HEPARIN SOD (PORK) LOCK FLUSH 100 UNIT/ML IV SOLN
500.0000 [IU] | Freq: Once | INTRAVENOUS | Status: AC | PRN
  Administered 2014-03-03: 500 [IU]
  Filled 2014-03-03: qty 5

## 2014-03-03 NOTE — Progress Notes (Signed)
Pt states 3/10 generalized aches that occur after getting her chemo treatments.  Dr Benay Spice is aware that this pain occurs.  She states that about 3 days later the pain resolves. Has pain medication she takes at home as needed.  SLJ

## 2014-03-13 ENCOUNTER — Encounter: Payer: Self-pay | Admitting: Genetic Counselor

## 2014-03-13 ENCOUNTER — Ambulatory Visit (HOSPITAL_BASED_OUTPATIENT_CLINIC_OR_DEPARTMENT_OTHER): Payer: Self-pay | Admitting: Genetic Counselor

## 2014-03-13 ENCOUNTER — Other Ambulatory Visit: Payer: Self-pay

## 2014-03-13 DIAGNOSIS — Z800 Family history of malignant neoplasm of digestive organs: Secondary | ICD-10-CM | POA: Insufficient documentation

## 2014-03-13 DIAGNOSIS — C187 Malignant neoplasm of sigmoid colon: Secondary | ICD-10-CM | POA: Insufficient documentation

## 2014-03-13 DIAGNOSIS — IMO0002 Reserved for concepts with insufficient information to code with codable children: Secondary | ICD-10-CM

## 2014-03-13 NOTE — Progress Notes (Signed)
HISTORY OF PRESENT ILLNESS: Dr. Juventino Slovak requested a cancer genetics consultation for Amy Bentley, a 43 y.o. female, due to a personal and family history of colorectal cancer.  Amy Bentley presents to clinic today to discuss the possibility of a hereditary predisposition to cancer, genetic testing, and to further clarify her future cancer risks, as well as potential cancer risk for family members.   Amy Bentley was diagnosed with stage IIC (T4b, N0), adenocarcinoma the sigmoid colon, status post a partial colectomy and en bloc bladder resection 10/20/2013, negative surgical margins, no preoperative colonoscopy. No loss of mismatch repair protein expression. Microsatellite stable. She has no history of other cancer.   Past Medical History  Diagnosis Date   GERD (gastroesophageal reflux disease)    Cancer     Past Surgical History  Procedure Laterality Date   Cesarean section      x 2   Colon resection N/A 10/20/2013    Procedure: OPEN SIGMOID COLONECTOMY COLOVESICAL FISTULA REPAIR;  Surgeon: Stark Klein, MD;  Location: [facility];  Service: General;  Laterality: N/A;   Cystectomy N/A 10/20/2013    Procedure: CYSTECTOMY PARTIAL;  Surgeon: Ardis Hughs, MD;  Location: [facility];  Service: Urology;  Laterality: N/A;   Tubal ligation     Portacath placement N/A 11/15/2013    Procedure: INSERTION PORT-A-CATH;  Surgeon: Stark Klein, MD;  Location: [facility];  Service: General;  Laterality: N/A;    History   Social History   Marital Status: Married    Spouse Name: N/A    Number of Children: N/A   Years of Education: N/A   Social History Main Topics   Smoking status: Former Smoker -- 0.50 packs/day    Types: Cigarettes    Quit date: 10/17/2013   Smokeless tobacco: Never Used   Alcohol Use: Yes     Comment: socially   Drug Use: No   Sexual Activity: No   Other Topics Concern   Not on file   Social History Narrative   No narrative on file     FAMILY HISTORY:   During the visit, a 4-generation pedigree was obtained. Significant diagnoses include the following:  Family History  Problem Relation Age of Onset   Cancer Paternal Aunt 53    colon    Amy Bentley's ancestry is of African American descent. There is no known Jewish ancestry or consanguinity.  GENETIC COUNSELING ASSESSMENT: Amy Bentley is a 43 y.o. female with a personal and family history of cancer suggestive of a hereditary predisposition to cancer. We, therefore, discussed and recommended the following at today's visit.   DISCUSSION: We reviewed the characteristics, features and inheritance patterns of hereditary cancer syndromes. We also discussed genetic testing, including the appropriate family members to test, the process of testing, insurance coverage and turn-around-time for results. We discussed the implications of a negative, positive and/or variant of uncertain significant result. Although Amy Bentley's colorectal tumor was MSS and IHC for Lynch syndrome was normal, given her striking young age in combination with the family history, we recommended Amy Bentley pursue genetic testing for the ColoNext gene panel at [facility]. This panel looks at several genes associated with an increased risk for colorectal cancer, including several polyposis syndrome genes, which may be the explanation for Amy Bentley's young age of diagnosis.   PLAN: Amy Bentley did not wish to pursue genetic testing today as she is currently uninsured. She is however, planning to obtain insurance coverage with [employer]BS in January 2016 and would like  to pursue testing at that time. We understand her decision and remain available to coordinate testing in January 2016 or at any time in the future. In the meantime we recommend Amy Bentley continue to follow the cancer management and screening guidelines given to he by her oncology and primary providers. We also encouraged Amy Bentley to remain in contact  with cancer genetics annually so that we can continuously update the family history and inform her of any changes in cancer genetics and testing that may be of benefit for this family. Ms.  Bentley questions were answered to her satisfaction today. Our contact information was provided should additional questions or concerns arise.   Thank you for the referral and allowing [place] to share in the care of your patient.   The patient was seen for a total of 40 minutes in face-to-face genetic counseling.  This patient was discussed with Amy Bentley who agrees with the above.    _______________________________________________________________________ For Office Staff:  Number of people involved in session: 2 Was an Intern/ student involved with case: not applicable

## 2014-03-15 ENCOUNTER — Telehealth: Payer: Self-pay | Admitting: *Deleted

## 2014-03-15 ENCOUNTER — Encounter: Payer: Self-pay | Admitting: Oncology

## 2014-03-15 ENCOUNTER — Ambulatory Visit (HOSPITAL_BASED_OUTPATIENT_CLINIC_OR_DEPARTMENT_OTHER): Payer: Self-pay

## 2014-03-15 ENCOUNTER — Other Ambulatory Visit: Payer: Self-pay | Admitting: Oncology

## 2014-03-15 ENCOUNTER — Ambulatory Visit (HOSPITAL_BASED_OUTPATIENT_CLINIC_OR_DEPARTMENT_OTHER): Payer: Self-pay | Admitting: Nurse Practitioner

## 2014-03-15 ENCOUNTER — Other Ambulatory Visit (HOSPITAL_BASED_OUTPATIENT_CLINIC_OR_DEPARTMENT_OTHER): Payer: Self-pay

## 2014-03-15 ENCOUNTER — Telehealth: Payer: Self-pay | Admitting: Oncology

## 2014-03-15 VITALS — BP 137/84 | HR 66 | Temp 98.6°F | Resp 20 | Ht 61.0 in | Wt 259.2 lb

## 2014-03-15 DIAGNOSIS — C187 Malignant neoplasm of sigmoid colon: Secondary | ICD-10-CM

## 2014-03-15 DIAGNOSIS — G620 Drug-induced polyneuropathy: Secondary | ICD-10-CM

## 2014-03-15 DIAGNOSIS — Z5111 Encounter for antineoplastic chemotherapy: Secondary | ICD-10-CM

## 2014-03-15 DIAGNOSIS — Z23 Encounter for immunization: Secondary | ICD-10-CM

## 2014-03-15 LAB — CBC WITH DIFFERENTIAL/PLATELET
BASO%: 0.1 % (ref 0.0–2.0)
Basophils Absolute: 0 10*3/uL (ref 0.0–0.1)
EOS%: 0 % (ref 0.0–7.0)
Eosinophils Absolute: 0 10*3/uL (ref 0.0–0.5)
HCT: 35.9 % (ref 34.8–46.6)
HGB: 11.4 g/dL — ABNORMAL LOW (ref 11.6–15.9)
LYMPH%: 7.2 % — ABNORMAL LOW (ref 14.0–49.7)
MCH: 25.5 pg (ref 25.1–34.0)
MCHC: 31.7 g/dL (ref 31.5–36.0)
MCV: 80.6 fL (ref 79.5–101.0)
MONO#: 0.2 10*3/uL (ref 0.1–0.9)
MONO%: 2 % (ref 0.0–14.0)
NEUT#: 7.4 10*3/uL — ABNORMAL HIGH (ref 1.5–6.5)
NEUT%: 90.7 % — ABNORMAL HIGH (ref 38.4–76.8)
Platelets: 165 10*3/uL (ref 145–400)
RBC: 4.45 10*6/uL (ref 3.70–5.45)
RDW: 25.3 % — ABNORMAL HIGH (ref 11.2–14.5)
WBC: 8.2 10*3/uL (ref 3.9–10.3)
lymph#: 0.6 10*3/uL — ABNORMAL LOW (ref 0.9–3.3)

## 2014-03-15 LAB — COMPREHENSIVE METABOLIC PANEL (CC13)
ALT: 19 U/L (ref 0–55)
AST: 22 U/L (ref 5–34)
Albumin: 3.6 g/dL (ref 3.5–5.0)
Alkaline Phosphatase: 148 U/L (ref 40–150)
Anion Gap: 10 mEq/L (ref 3–11)
BUN: 21.9 mg/dL (ref 7.0–26.0)
CO2: 21 mEq/L — ABNORMAL LOW (ref 22–29)
Calcium: 9.4 mg/dL (ref 8.4–10.4)
Chloride: 108 mEq/L (ref 98–109)
Creatinine: 1 mg/dL (ref 0.6–1.1)
Glucose: 133 mg/dl (ref 70–140)
Potassium: 4.1 mEq/L (ref 3.5–5.1)
Sodium: 139 mEq/L (ref 136–145)
Total Bilirubin: 0.39 mg/dL (ref 0.20–1.20)
Total Protein: 7.5 g/dL (ref 6.4–8.3)

## 2014-03-15 MED ORDER — SODIUM CHLORIDE 0.9 % IV SOLN
Freq: Once | INTRAVENOUS | Status: AC
  Administered 2014-03-15: 11:00:00 via INTRAVENOUS

## 2014-03-15 MED ORDER — SODIUM CHLORIDE 0.9 % IV SOLN
5000.0000 mg | INTRAVENOUS | Status: DC
  Administered 2014-03-15: 5000 mg via INTRAVENOUS
  Filled 2014-03-15: qty 100

## 2014-03-15 MED ORDER — INFLUENZA VAC SPLIT QUAD 0.5 ML IM SUSY
0.5000 mL | PREFILLED_SYRINGE | Freq: Once | INTRAMUSCULAR | Status: AC
  Administered 2014-03-15: 0.5 mL via INTRAMUSCULAR
  Filled 2014-03-15: qty 0.5

## 2014-03-15 MED ORDER — HEPARIN SOD (PORK) LOCK FLUSH 100 UNIT/ML IV SOLN
500.0000 [IU] | Freq: Once | INTRAVENOUS | Status: DC | PRN
  Filled 2014-03-15: qty 5

## 2014-03-15 MED ORDER — FLUOROURACIL CHEMO INJECTION 2.5 GM/50ML
400.0000 mg/m2 | Freq: Once | INTRAVENOUS | Status: AC
  Administered 2014-03-15: 850 mg via INTRAVENOUS
  Filled 2014-03-15: qty 17

## 2014-03-15 MED ORDER — SODIUM CHLORIDE 0.9 % IJ SOLN
10.0000 mL | INTRAMUSCULAR | Status: DC | PRN
  Filled 2014-03-15: qty 10

## 2014-03-15 MED ORDER — LEUCOVORIN CALCIUM INJECTION 350 MG
403.0000 mg/m2 | Freq: Once | INTRAVENOUS | Status: AC
  Administered 2014-03-15: 850 mg via INTRAVENOUS
  Filled 2014-03-15: qty 42.5

## 2014-03-15 NOTE — Telephone Encounter (Signed)
Pt confirmed labs/ov per 09/17 POF, sent msg to add chemo gave pt AVS..Marland KitchenKJ

## 2014-03-15 NOTE — Progress Notes (Signed)
Noticed that pt's 75% discount expired on 02/04/14.  Met with her and gave her another application to apply for assistance.

## 2014-03-15 NOTE — Progress Notes (Signed)
[place] OFFICE PROGRESS NOTE   Diagnosis:  Colon cancer  INTERVAL HISTORY:   Amy Bentley returns as scheduled. She completed cycle 8 FOLFOX on 03/01/2014. She denies nausea/vomiting. No mouth sores. She had loose stools lasting 24 hours. The cold sensitivity lasted 3-4 days. She has had persistent numbness/tingling in both feet since the last treatment. She also has pain in the hands and feet. She denies any redness or skin breakdown over the palms or soles. She has noted some difficulty opening jars. For about 3-4 days after reached treatment she notes diffuse skin sensitivity followed by joint pains. She noted itchy, watery eyes following the most recent chemotherapy.  Objective:  Vital signs in last 24 hours:  Blood pressure 137/84, pulse 66, temperature 98.6 F (37 C), temperature source Oral, resp. rate 20, height 5' 1"  (1.549 m), weight 259 lb 3.2 oz (117.572 kg), SpO2 100.00%.    HEENT: No thrush or ulcers. Resp: Lungs clear bilaterally. Cardio: Regular rate and rhythm. GI: Abdomen soft and nontender. No hepatomegaly. Vascular: No leg edema. Neuro: Marked decrease in vibratory sense over the fingertips. Mild to moderate decrease in vibratory sense over the toes. Skin: No rash. Port-A-Cath site without erythema.    Lab Results:  Lab Results  Component Value Date   WBC 8.2 03/15/2014   HGB 11.4* 03/15/2014   HCT 35.9 03/15/2014   MCV 80.6 03/15/2014   PLT 165 03/15/2014   NEUTROABS 7.4* 03/15/2014    Imaging:  No results found.  Medications: I have reviewed the patient's current medications.  Assessment/Plan: 1. Stage IIC (T4b, N0), adenocarcinoma the sigmoid colon, status post a partial colectomy and en bloc bladder resection 10/20/2013, negative surgical margins, no preoperative colonoscopy. No loss of mismatch repair protein expression. Microsatellite stable. Mildly elevated preoperative CEA.  CEA normal (1.0) on 11/09/2013.  Cycle 1 adjuvant  FOLFOX 11/23/2013.  Cycle 2 adjuvant FOLFOX 12/07/2013.  Cycle 3 adjuvant FOLFOX 12/21/2013 (oxaliplatin dose reduced to 65 mg per meter squared due to neuropathy symptoms).  Cycle 4 adjuvant FOLFOX 01/04/2014.  Cycle 5 adjuvant FOLFOX 01/18/2014.  Cycle 6 adjuvant FOLFOX 02/01/2014-oxaliplatin discontinued prematurely secondary to an apparent hypersensitivity reaction.  Cycle 7 of adjuvant FOLFOX 02/15/2014. Cycle 8 adjuvant FOLFOX 03/01/2014. 2. Microcytic anemia-likely iron deficiency anemia. She continues oral iron. 3. Right breast nodule on the chest CT 11/14/2013.  Diagnostic mammogram and ultrasound 12/26/2013 showed a probable fibroadenoma within the inner right breast.  She discussed management options with the radiologist including excision, biopsy and short interval followup. She elected short interval followup. A followup ultrasound is recommended at 6, 12 and 24 months to assess stability. 4. Port-A-Cath placement 11/15/2013. 5. Delayed nausea following cycle 1 FOLFOX. Aloxi and Emend added with cycle 2 with improvement. 6. 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 Oxaliplatin was dose reduced to 65 mg per meter squared beginning with cycle 3. Increased neuropathy symptoms with persistent numbness/tingling in both feet following cycle 8 FOLFOX. Oxaliplatin held with cycle 9. 7. Ocular pruritus. Most likely related to 5-fluorouracil. She did not experience ocular pruritus following cycle 5 FOLFOX. 8. Hypersensitivity reaction to oxaliplatin with cycle 6 FOLFOX-she was premedicated with Decadron and given additional steroid/antihistamine premedication beginning with cycle 7 FOLFOX with no reaction.    Disposition: Amy Bentley has completed 8 cycles of FOLFOX. Plan to proceed with cycle 9 today as scheduled. Oxaliplatin will be held with cycle 9 due to increased neuropathy symptoms with  persistent  numbness/tingling in the feet. She is having difficulty performing certain tasks with her hands as well.  She will return for a followup visit and cycle 10 in 2 weeks. Oxaliplatin may be resumed with cycle 10 pending reevaluation.  Plan reviewed with Dr. Benay Spice. 25 minutes were spent face-to-face at today's visit with the majority of the time involved in counseling/coordination of care.   Ned Card ANP/GNP-BC   03/15/2014  10:31 AM

## 2014-03-15 NOTE — Patient Instructions (Addendum)
[place] Discharge Instructions for Patients Receiving Chemotherapy  Today you received the following chemotherapy agents: Leucovorin, Adrucil (5FU). You received your flu shot today as well.  To help prevent nausea and vomiting after your treatment, we encourage you to take your nausea medication as prescribed by your physician.   If you develop nausea and vomiting that is not controlled by your nausea medication, call the clinic.   BELOW ARE SYMPTOMS THAT SHOULD BE REPORTED IMMEDIATELY:  *FEVER GREATER THAN 100.5 F  *CHILLS WITH OR WITHOUT FEVER  NAUSEA AND VOMITING THAT IS NOT CONTROLLED WITH YOUR NAUSEA MEDICATION  *UNUSUAL SHORTNESS OF BREATH  *UNUSUAL BRUISING OR BLEEDING  TENDERNESS IN MOUTH AND THROAT WITH OR WITHOUT PRESENCE OF ULCERS  *URINARY PROBLEMS  *BOWEL PROBLEMS  UNUSUAL RASH Items with * indicate a potential emergency and should be followed up as soon as possible.  Feel free to call the clinic you have any questions or concerns. The clinic phone number is [phone].

## 2014-03-15 NOTE — Telephone Encounter (Signed)
Per staff message and POF I have scheduled appts. Advised scheduler of appts. JMW  

## 2014-03-17 ENCOUNTER — Ambulatory Visit (HOSPITAL_BASED_OUTPATIENT_CLINIC_OR_DEPARTMENT_OTHER): Payer: Self-pay

## 2014-03-17 VITALS — BP 113/78 | HR 103 | Temp 98.2°F | Resp 17

## 2014-03-17 DIAGNOSIS — Z452 Encounter for adjustment and management of vascular access device: Secondary | ICD-10-CM

## 2014-03-17 DIAGNOSIS — C187 Malignant neoplasm of sigmoid colon: Secondary | ICD-10-CM

## 2014-03-17 MED ORDER — SODIUM CHLORIDE 0.9 % IJ SOLN
10.0000 mL | INTRAMUSCULAR | Status: DC | PRN
  Administered 2014-03-17: 10 mL
  Filled 2014-03-17: qty 10

## 2014-03-17 MED ORDER — HEPARIN SOD (PORK) LOCK FLUSH 100 UNIT/ML IV SOLN
500.0000 [IU] | Freq: Once | INTRAVENOUS | Status: AC | PRN
  Administered 2014-03-17: 500 [IU]
  Filled 2014-03-17: qty 5

## 2014-03-25 ENCOUNTER — Other Ambulatory Visit: Payer: Self-pay | Admitting: Oncology

## 2014-03-29 ENCOUNTER — Ambulatory Visit (HOSPITAL_BASED_OUTPATIENT_CLINIC_OR_DEPARTMENT_OTHER): Payer: Self-pay

## 2014-03-29 ENCOUNTER — Ambulatory Visit (HOSPITAL_BASED_OUTPATIENT_CLINIC_OR_DEPARTMENT_OTHER): Payer: Self-pay | Admitting: Nurse Practitioner

## 2014-03-29 ENCOUNTER — Telehealth: Payer: Self-pay | Admitting: *Deleted

## 2014-03-29 ENCOUNTER — Telehealth: Payer: Self-pay | Admitting: Nurse Practitioner

## 2014-03-29 ENCOUNTER — Other Ambulatory Visit (HOSPITAL_BASED_OUTPATIENT_CLINIC_OR_DEPARTMENT_OTHER): Payer: Self-pay

## 2014-03-29 VITALS — BP 100/66 | HR 85 | Temp 98.4°F | Resp 20 | Ht 61.0 in | Wt 263.4 lb

## 2014-03-29 DIAGNOSIS — G629 Polyneuropathy, unspecified: Secondary | ICD-10-CM

## 2014-03-29 DIAGNOSIS — C187 Malignant neoplasm of sigmoid colon: Secondary | ICD-10-CM

## 2014-03-29 DIAGNOSIS — D509 Iron deficiency anemia, unspecified: Secondary | ICD-10-CM

## 2014-03-29 DIAGNOSIS — Z5111 Encounter for antineoplastic chemotherapy: Secondary | ICD-10-CM

## 2014-03-29 LAB — CBC WITH DIFFERENTIAL/PLATELET
BASO%: 0.7 % (ref 0.0–2.0)
Basophils Absolute: 0 10*3/uL (ref 0.0–0.1)
EOS%: 2.4 % (ref 0.0–7.0)
Eosinophils Absolute: 0.1 10*3/uL (ref 0.0–0.5)
HCT: 34.9 % (ref 34.8–46.6)
HGB: 10.9 g/dL — ABNORMAL LOW (ref 11.6–15.9)
LYMPH%: 18.2 % (ref 14.0–49.7)
MCH: 25.8 pg (ref 25.1–34.0)
MCHC: 31.3 g/dL — ABNORMAL LOW (ref 31.5–36.0)
MCV: 82.5 fL (ref 79.5–101.0)
MONO#: 0.6 10*3/uL (ref 0.1–0.9)
MONO%: 9.7 % (ref 0.0–14.0)
NEUT#: 4 10*3/uL (ref 1.5–6.5)
NEUT%: 69 % (ref 38.4–76.8)
Platelets: 145 10*3/uL (ref 145–400)
RBC: 4.23 10*6/uL (ref 3.70–5.45)
RDW: 23.7 % — ABNORMAL HIGH (ref 11.2–14.5)
WBC: 5.8 10*3/uL (ref 3.9–10.3)
lymph#: 1.1 10*3/uL (ref 0.9–3.3)
nRBC: 0 % (ref 0–0)

## 2014-03-29 LAB — COMPREHENSIVE METABOLIC PANEL (CC13)
ALT: 21 U/L (ref 0–55)
AST: 22 U/L (ref 5–34)
Albumin: 3.5 g/dL (ref 3.5–5.0)
Alkaline Phosphatase: 147 U/L (ref 40–150)
Anion Gap: 6 mEq/L (ref 3–11)
BUN: 15.5 mg/dL (ref 7.0–26.0)
CO2: 26 mEq/L (ref 22–29)
Calcium: 9.7 mg/dL (ref 8.4–10.4)
Chloride: 106 mEq/L (ref 98–109)
Creatinine: 0.9 mg/dL (ref 0.6–1.1)
Glucose: 92 mg/dl (ref 70–140)
Potassium: 4.1 mEq/L (ref 3.5–5.1)
Sodium: 138 mEq/L (ref 136–145)
Total Bilirubin: 0.38 mg/dL (ref 0.20–1.20)
Total Protein: 7.3 g/dL (ref 6.4–8.3)

## 2014-03-29 MED ORDER — DIPHENHYDRAMINE HCL 50 MG/ML IJ SOLN
INTRAMUSCULAR | Status: AC
  Filled 2014-03-29: qty 1

## 2014-03-29 MED ORDER — FAMOTIDINE IN NACL 20-0.9 MG/50ML-% IV SOLN
INTRAVENOUS | Status: AC
  Filled 2014-03-29: qty 50

## 2014-03-29 MED ORDER — SODIUM CHLORIDE 0.9 % IV SOLN
2370.0000 mg/m2 | INTRAVENOUS | Status: DC
  Administered 2014-03-29: 5000 mg via INTRAVENOUS
  Filled 2014-03-29: qty 100

## 2014-03-29 MED ORDER — PALONOSETRON HCL INJECTION 0.25 MG/5ML
INTRAVENOUS | Status: AC
  Filled 2014-03-29: qty 5

## 2014-03-29 MED ORDER — FAMOTIDINE IN NACL 20-0.9 MG/50ML-% IV SOLN
20.0000 mg | Freq: Once | INTRAVENOUS | Status: AC
  Administered 2014-03-29: 20 mg via INTRAVENOUS

## 2014-03-29 MED ORDER — PALONOSETRON HCL INJECTION 0.25 MG/5ML
0.2500 mg | Freq: Once | INTRAVENOUS | Status: AC
  Administered 2014-03-29: 0.25 mg via INTRAVENOUS

## 2014-03-29 MED ORDER — FLUOROURACIL CHEMO INJECTION 2.5 GM/50ML
400.0000 mg/m2 | Freq: Once | INTRAVENOUS | Status: AC
  Administered 2014-03-29: 850 mg via INTRAVENOUS
  Filled 2014-03-29: qty 17

## 2014-03-29 MED ORDER — OXALIPLATIN CHEMO INJECTION 100 MG/20ML: 65.0000 mg/m2 | Freq: Once | INTRAVENOUS | Status: DC

## 2014-03-29 MED ORDER — DEXTROSE 5 % IV SOLN
Freq: Once | INTRAVENOUS | Status: AC
  Administered 2014-03-29: 13:00:00 via INTRAVENOUS

## 2014-03-29 MED ORDER — METHYLPREDNISOLONE SODIUM SUCC 125 MG IJ SOLR
125.0000 mg | Freq: Once | INTRAMUSCULAR | Status: AC
  Administered 2014-03-29: 125 mg via INTRAVENOUS

## 2014-03-29 MED ORDER — LEUCOVORIN CALCIUM INJECTION 350 MG
403.0000 mg/m2 | Freq: Once | INTRAVENOUS | Status: AC
  Administered 2014-03-29: 850 mg via INTRAVENOUS
  Filled 2014-03-29: qty 42.5

## 2014-03-29 MED ORDER — OXALIPLATIN CHEMO INJECTION 100 MG/20ML
135.0000 mg | Freq: Once | INTRAVENOUS | Status: AC
  Administered 2014-03-29: 135 mg via INTRAVENOUS
  Filled 2014-03-29: qty 27

## 2014-03-29 MED ORDER — DEXAMETHASONE 4 MG PO TABS: ORAL_TABLET | ORAL | Status: DC

## 2014-03-29 MED ORDER — METHYLPREDNISOLONE SODIUM SUCC 125 MG IJ SOLR
INTRAMUSCULAR | Status: AC
  Filled 2014-03-29: qty 2

## 2014-03-29 MED ORDER — SODIUM CHLORIDE 0.9 % IV SOLN
150.0000 mg | Freq: Once | INTRAVENOUS | Status: AC
  Administered 2014-03-29: 150 mg via INTRAVENOUS
  Filled 2014-03-29: qty 5

## 2014-03-29 MED ORDER — DIPHENHYDRAMINE HCL 50 MG/ML IJ SOLN
25.0000 mg | Freq: Once | INTRAMUSCULAR | Status: AC
  Administered 2014-03-29: 25 mg via INTRAVENOUS

## 2014-03-29 NOTE — Telephone Encounter (Signed)
, °

## 2014-03-29 NOTE — Progress Notes (Signed)
Per Dr. Benay Spice, keep 5FU pump at the same rate, and have patient return on 03/31/14 at 1pm for disconnect; okay to waste remainder of 5FU.

## 2014-03-29 NOTE — Telephone Encounter (Signed)
Per staff message and POF I have scheduled appts. Advised scheduler of appts. JMW  

## 2014-03-29 NOTE — Patient Instructions (Signed)
[facility] Discharge Instructions for Patients Receiving Chemotherapy  Today you received the following chemotherapy agents Oxaliplatin, leucovorin,flurourocil  To help prevent nausea and vomiting after your treatment, we encourage you to take your nausea medication Compazine as directed   If you develop nausea and vomiting that is not controlled by your nausea medication, call the clinic.   BELOW ARE SYMPTOMS THAT SHOULD BE REPORTED IMMEDIATELY:  *FEVER GREATER THAN 100.5 F  *CHILLS WITH OR WITHOUT FEVER  NAUSEA AND VOMITING THAT IS NOT CONTROLLED WITH YOUR NAUSEA MEDICATION  *UNUSUAL SHORTNESS OF BREATH  *UNUSUAL BRUISING OR BLEEDING  TENDERNESS IN MOUTH AND THROAT WITH OR WITHOUT PRESENCE OF ULCERS  *URINARY PROBLEMS  *BOWEL PROBLEMS  UNUSUAL RASH Items with * indicate a potential emergency and should be followed up as soon as possible.  Feel free to call the clinic you have any questions or concerns. The clinic phone number is [phone].

## 2014-03-29 NOTE — Progress Notes (Signed)
[facility] OFFICE PROGRESS NOTE   Diagnosis:  Colon cancer  INTERVAL HISTORY:   Amy Bentley returns as scheduled. She completed cycle 9 FOLFOX on 03/15/2014. The oxaliplatin was held due to increased neuropathy symptoms.  She denies nausea/vomiting. No mouth sores. No diarrhea. She notes less numbness/tingling in the feet. No pain in the hands or feet. She continues to have difficulty opening jars. She has noted some peeling over the palms. No associated pain or redness. Eyes are intermittently itchy. She has a "cold". Main symptoms are coughing and sneezing. No fever or shortness of breath. She had significantly less joint pains following the most recent cycle of chemotherapy.  Objective:  Vital signs in last 24 hours:  Blood pressure 100/66, pulse 85, temperature 98.4 F (36.9 C), temperature source Oral, resp. rate 20, height 5' 1"  (1.549 m), weight 263 lb 6.4 oz (119.477 kg).    HEENT: No thrush or ulcers. Resp: Lungs clear bilaterally. Cardio: Regular rate and rhythm. GI: Abdomen soft and nontender. No hepatomegaly. Vascular: No leg edema. Calves soft and nontender. Neuro: Vibratory sense mildly decreased over the fingertips per tuning fork exam.  Skin: Palms with mild dryness. No erythema. Port-A-Cath without erythema.    Lab Results:  Lab Results  Component Value Date   WBC 5.8 03/29/2014   HGB 10.9* 03/29/2014   HCT 34.9 03/29/2014   MCV 82.5 03/29/2014   PLT 145 03/29/2014   NEUTROABS 4.0 03/29/2014    Imaging:  No results found.  Medications: I have reviewed the patient's current medications.  Assessment/Plan: 1. Stage IIC (T4b, N0), adenocarcinoma the sigmoid colon, status post a partial colectomy and en bloc bladder resection 10/20/2013, negative surgical margins, no preoperative colonoscopy. No loss of mismatch repair protein expression. Microsatellite stable. Mildly elevated preoperative CEA.  CEA normal (1.0) on 11/09/2013.  Cycle 1  adjuvant FOLFOX 11/23/2013.  Cycle 2 adjuvant FOLFOX 12/07/2013.  Cycle 3 adjuvant FOLFOX 12/21/2013 (oxaliplatin dose reduced to 65 mg per meter squared due to neuropathy symptoms).  Cycle 4 adjuvant FOLFOX 01/04/2014.  Cycle 5 adjuvant FOLFOX 01/18/2014.  Cycle 6 adjuvant FOLFOX 02/01/2014-oxaliplatin discontinued prematurely secondary to an apparent hypersensitivity reaction.  Cycle 7 of adjuvant FOLFOX 02/15/2014.  Cycle 8 adjuvant FOLFOX 03/01/2014. Cycle 9 adjuvant FOLFOX 03/15/2014. Oxaliplatin was held due to increased neuropathy symptoms 2. Microcytic anemia-likely iron deficiency anemia. She continues oral iron. 3. Right breast nodule on the chest CT 11/14/2013.  Diagnostic mammogram and ultrasound 12/26/2013 showed a probable fibroadenoma within the inner right breast.  She discussed management options with the radiologist including excision, biopsy and short interval followup. She elected short interval followup. A followup ultrasound is recommended at 6, 12 and 24 months to assess stability. 4. Port-A-Cath placement 11/15/2013. 5. Delayed nausea following cycle 1 FOLFOX. Aloxi and Emend added with cycle 2 with improvement. 6. 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 Oxaliplatin was dose reduced to 65 mg per meter squared beginning with cycle 3. Increased neuropathy symptoms with persistent numbness/tingling in both feet following cycle 8 FOLFOX. Oxaliplatin held with cycle 9. Neuropathy symptoms improved 03/29/2014. Oxaliplatin resumed with cycle 10. 7. Ocular pruritus. Most likely related to 5-fluorouracil. She did not experience ocular pruritus following cycle 5 FOLFOX. She has intermittent ocular pruritus. 8. Hypersensitivity reaction to oxaliplatin with cycle 6 FOLFOX-she was premedicated with Decadron and given additional steroid/antihistamine premedication beginning with cycle 7 FOLFOX with no  reaction.  9. Dryness over the palms.  Question early hand-foot syndrome related to 5-fluorouracil.   Disposition: Amy Bentley appears stable. She has completed 9 cycles of FOLFOX. Oxaliplatin was held with cycle 9 due to increased neuropathy symptoms. The neuropathy symptoms are better. Plan to proceed with cycle 10 FOLFOX today as scheduled with oxaliplatin.  She will return for a followup visit and cycle 11 FOLFOX in 2 weeks. She will contact the office in the interim with any problems.  Plan reviewed with Dr. Benay Spice.    Ned Card ANP/GNP-BC   03/29/2014  12:14 PM

## 2014-03-31 ENCOUNTER — Ambulatory Visit (HOSPITAL_BASED_OUTPATIENT_CLINIC_OR_DEPARTMENT_OTHER): Payer: Self-pay

## 2014-03-31 VITALS — BP 142/62 | HR 64 | Temp 98.4°F | Resp 18

## 2014-03-31 DIAGNOSIS — Z452 Encounter for adjustment and management of vascular access device: Secondary | ICD-10-CM

## 2014-03-31 DIAGNOSIS — C187 Malignant neoplasm of sigmoid colon: Secondary | ICD-10-CM

## 2014-03-31 MED ORDER — SODIUM CHLORIDE 0.9 % IJ SOLN
10.0000 mL | INTRAMUSCULAR | Status: DC | PRN
  Administered 2014-03-31: 10 mL
  Filled 2014-03-31: qty 10

## 2014-03-31 MED ORDER — HEPARIN SOD (PORK) LOCK FLUSH 100 UNIT/ML IV SOLN
500.0000 [IU] | Freq: Once | INTRAVENOUS | Status: AC | PRN
  Administered 2014-03-31: 500 [IU]
  Filled 2014-03-31: qty 5

## 2014-03-31 NOTE — Progress Notes (Signed)
18.7ml fluorouracil remaining in pump infusion.  Explained to patient that we are unable to bolus remaining amount.  Pt verbalized understanding, pump disconnected with remaining medication per patient request.

## 2014-04-12 ENCOUNTER — Other Ambulatory Visit (HOSPITAL_BASED_OUTPATIENT_CLINIC_OR_DEPARTMENT_OTHER): Payer: Self-pay

## 2014-04-12 ENCOUNTER — Ambulatory Visit (HOSPITAL_BASED_OUTPATIENT_CLINIC_OR_DEPARTMENT_OTHER): Payer: Self-pay

## 2014-04-12 ENCOUNTER — Ambulatory Visit (HOSPITAL_BASED_OUTPATIENT_CLINIC_OR_DEPARTMENT_OTHER): Payer: Self-pay | Admitting: Oncology

## 2014-04-12 VITALS — BP 137/81 | HR 73 | Temp 98.4°F | Resp 20 | Ht 61.0 in | Wt 267.4 lb

## 2014-04-12 DIAGNOSIS — G620 Drug-induced polyneuropathy: Secondary | ICD-10-CM

## 2014-04-12 DIAGNOSIS — C187 Malignant neoplasm of sigmoid colon: Secondary | ICD-10-CM

## 2014-04-12 DIAGNOSIS — Z5111 Encounter for antineoplastic chemotherapy: Secondary | ICD-10-CM

## 2014-04-12 LAB — CBC WITH DIFFERENTIAL/PLATELET
BASO%: 0 % (ref 0.0–2.0)
Basophils Absolute: 0 10*3/uL (ref 0.0–0.1)
EOS%: 0 % (ref 0.0–7.0)
Eosinophils Absolute: 0 10*3/uL (ref 0.0–0.5)
HCT: 34.7 % — ABNORMAL LOW (ref 34.8–46.6)
HGB: 11.1 g/dL — ABNORMAL LOW (ref 11.6–15.9)
LYMPH%: 5.3 % — ABNORMAL LOW (ref 14.0–49.7)
MCH: 26.1 pg (ref 25.1–34.0)
MCHC: 32 g/dL (ref 31.5–36.0)
MCV: 81.5 fL (ref 79.5–101.0)
MONO#: 0.2 10*3/uL (ref 0.1–0.9)
MONO%: 1.9 % (ref 0.0–14.0)
NEUT#: 8.8 10*3/uL — ABNORMAL HIGH (ref 1.5–6.5)
NEUT%: 92.8 % — ABNORMAL HIGH (ref 38.4–76.8)
Platelets: 160 10*3/uL (ref 145–400)
RBC: 4.26 10*6/uL (ref 3.70–5.45)
RDW: 19.6 % — ABNORMAL HIGH (ref 11.2–14.5)
WBC: 9.4 10*3/uL (ref 3.9–10.3)
lymph#: 0.5 10*3/uL — ABNORMAL LOW (ref 0.9–3.3)
nRBC: 0 % (ref 0–0)

## 2014-04-12 LAB — COMPREHENSIVE METABOLIC PANEL (CC13)
ALT: 19 U/L (ref 0–55)
AST: 20 U/L (ref 5–34)
Albumin: 3.4 g/dL — ABNORMAL LOW (ref 3.5–5.0)
Alkaline Phosphatase: 154 U/L — ABNORMAL HIGH (ref 40–150)
Anion Gap: 9 mEq/L (ref 3–11)
BUN: 14.4 mg/dL (ref 7.0–26.0)
CO2: 22 mEq/L (ref 22–29)
Calcium: 9.5 mg/dL (ref 8.4–10.4)
Chloride: 110 mEq/L — ABNORMAL HIGH (ref 98–109)
Creatinine: 1 mg/dL (ref 0.6–1.1)
Glucose: 161 mg/dl — ABNORMAL HIGH (ref 70–140)
Potassium: 3.9 mEq/L (ref 3.5–5.1)
Sodium: 141 mEq/L (ref 136–145)
Total Bilirubin: 0.27 mg/dL (ref 0.20–1.20)
Total Protein: 7.3 g/dL (ref 6.4–8.3)

## 2014-04-12 MED ORDER — LEUCOVORIN CALCIUM INJECTION 350 MG
403.0000 mg/m2 | Freq: Once | INTRAVENOUS | Status: AC
  Administered 2014-04-12: 850 mg via INTRAVENOUS
  Filled 2014-04-12: qty 42.5

## 2014-04-12 MED ORDER — FLUOROURACIL CHEMO INJECTION 2.5 GM/50ML
400.0000 mg/m2 | Freq: Once | INTRAVENOUS | Status: AC
  Administered 2014-04-12: 850 mg via INTRAVENOUS
  Filled 2014-04-12: qty 17

## 2014-04-12 MED ORDER — DEXTROSE 5 % IV SOLN
Freq: Once | INTRAVENOUS | Status: AC
  Administered 2014-04-12: 11:00:00 via INTRAVENOUS

## 2014-04-12 MED ORDER — SODIUM CHLORIDE 0.9 % IV SOLN
2380.0000 mg/m2 | INTRAVENOUS | Status: DC
  Administered 2014-04-12: 5000 mg via INTRAVENOUS
  Filled 2014-04-12: qty 100

## 2014-04-12 NOTE — Progress Notes (Signed)
[facility] OFFICE PROGRESS NOTE   Diagnosis: Colon cancer  INTERVAL HISTORY:   Amy Bentley returns as scheduled. She completed cycle 10 FOLFOX 03/29/2012. She reports increased numbness and tingling for 2-3 days following chemotherapy. She continues to have numbness in the feet. She has difficulty opening objects with her hands. No nausea or diarrhea. She noted less muscle pain following this cycle of chemotherapy.  Objective:  Vital signs in last 24 hours:  Blood pressure 137/81, pulse 73, temperature 98.4 F (36.9 C), temperature source Oral, resp. rate 20, height _0  (1.549 m), weight 267 lb 6.4 oz (121.292 kg), SpO2 100.00%.    HEENT: No thrush or ulcers Resp: Lungs clear bilaterally Cardio: Regular rate and rhythm GI: No hepatosplenomegaly Vascular: No leg edema Neuro: Moderate loss of vibratory sense at the fingertips bilaterally  Skin: Hyperpigmentation in the hands   Portacath/PICC-without erythema  Lab Results:  Lab Results  Component Value Date   WBC 9.4 04/12/2014   HGB 11.1* 04/12/2014   HCT 34.7* 04/12/2014   MCV 81.5 04/12/2014   PLT 160 04/12/2014   NEUTROABS 8.8* 04/12/2014    Medications: I have reviewed the patient's current medications.  Assessment/Plan: 1. Stage IIC (T4b, N0), adenocarcinoma the sigmoid colon, status post a partial colectomy and en bloc bladder resection 10/20/2013, negative surgical margins, no preoperative colonoscopy. No loss of mismatch repair protein expression. Microsatellite stable. Mildly elevated preoperative CEA.  CEA normal (1.0) on 11/09/2013.  Cycle 1 adjuvant FOLFOX 11/23/2013.  Cycle 2 adjuvant FOLFOX 12/07/2013.  Cycle 3 adjuvant FOLFOX 12/21/2013 (oxaliplatin dose reduced to 65 mg per meter squared due to neuropathy symptoms).  Cycle 4 adjuvant FOLFOX 01/04/2014.  Cycle 5 adjuvant FOLFOX 01/18/2014.  Cycle 6 adjuvant FOLFOX 02/01/2014-oxaliplatin discontinued prematurely secondary to an  apparent hypersensitivity reaction.  Cycle 7 of adjuvant FOLFOX 02/15/2014.  Cycle 8 adjuvant FOLFOX 03/01/2014.  Cycle 9 adjuvant FOLFOX 03/15/2014. Oxaliplatin was held due to increased neuropathy symptoms Cycle 10 of FOLFOX 03/29/2014 2. Microcytic anemia-likely iron deficiency anemia. She continues oral iron. 3. Right breast nodule on the chest CT 11/14/2013.  Diagnostic mammogram and ultrasound 12/26/2013 showed a probable fibroadenoma within the inner right breast.  She discussed management options with the radiologist including excision, biopsy and short interval followup. She elected short interval followup. A followup ultrasound is recommended at 6, 12 and 24 months to assess stability. 4. Port-A-Cath placement 11/15/2013. 5. Delayed nausea following cycle 1 FOLFOX. Aloxi and Emend added with cycle 2 with improvement. 6. 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 Oxaliplatin was dose reduced to 65 mg per meter squared beginning with cycle 3. Increased neuropathy symptoms with persistent numbness/tingling in both feet following cycle 8 FOLFOX. Oxaliplatin held with cycle 9. Neuropathy symptoms improved 03/29/2014. Oxaliplatin resumed with cycle 10. 7. Ocular pruritus. Most likely related to 5-fluorouracil. She did not experience ocular pruritus following cycle 5 FOLFOX. She has intermittent ocular pruritus. 8. Hypersensitivity reaction to oxaliplatin with cycle 6 FOLFOX-she was premedicated with Decadron and given additional steroid/antihistamine premedication beginning with cycle 7 FOLFOX with no reaction.   Disposition:  She has developed persistent neuropathy symptoms. We decided to hold oxaliplatin for the final 2 cycles of FOLFOX. She will complete cycle 11 today. Ms. Handley will return for an office visit and the final planned cycle of chemotherapy in 2 weeks. We will check the CEA when she returns in 2  weeks.  Betsy Coder, MD  04/12/2014  10:08 AM

## 2014-04-12 NOTE — Patient Instructions (Signed)
[facility] Discharge Instructions for Patients Receiving Chemotherapy  Today you received the following chemotherapy agents 5 FU/Leucovorin To help prevent nausea and vomiting after your treatment, we encourage you to take your nausea medication as prescribed.   If you develop nausea and vomiting that is not controlled by your nausea medication, call the clinic.   BELOW ARE SYMPTOMS THAT SHOULD BE REPORTED IMMEDIATELY:  *FEVER GREATER THAN 100.5 F  *CHILLS WITH OR WITHOUT FEVER  NAUSEA AND VOMITING THAT IS NOT CONTROLLED WITH YOUR NAUSEA MEDICATION  *UNUSUAL SHORTNESS OF BREATH  *UNUSUAL BRUISING OR BLEEDING  TENDERNESS IN MOUTH AND THROAT WITH OR WITHOUT PRESENCE OF ULCERS  *URINARY PROBLEMS  *BOWEL PROBLEMS  UNUSUAL RASH Items with * indicate a potential emergency and should be followed up as soon as possible.  Feel free to call the clinic you have any questions or concerns. The clinic phone number is [phone].    

## 2014-04-14 ENCOUNTER — Ambulatory Visit (HOSPITAL_BASED_OUTPATIENT_CLINIC_OR_DEPARTMENT_OTHER): Payer: Self-pay

## 2014-04-14 VITALS — BP 118/67 | HR 106 | Temp 97.7°F | Resp 20

## 2014-04-14 DIAGNOSIS — Z452 Encounter for adjustment and management of vascular access device: Secondary | ICD-10-CM

## 2014-04-14 DIAGNOSIS — C187 Malignant neoplasm of sigmoid colon: Secondary | ICD-10-CM

## 2014-04-14 MED ORDER — HEPARIN SOD (PORK) LOCK FLUSH 100 UNIT/ML IV SOLN
500.0000 [IU] | Freq: Once | INTRAVENOUS | Status: AC | PRN
  Administered 2014-04-14: 500 [IU]
  Filled 2014-04-14: qty 5

## 2014-04-14 MED ORDER — SODIUM CHLORIDE 0.9 % IJ SOLN
10.0000 mL | INTRAMUSCULAR | Status: DC | PRN
  Administered 2014-04-14: 10 mL
  Filled 2014-04-14: qty 10

## 2014-04-22 ENCOUNTER — Other Ambulatory Visit: Payer: Self-pay | Admitting: Oncology

## 2014-04-26 ENCOUNTER — Ambulatory Visit (HOSPITAL_BASED_OUTPATIENT_CLINIC_OR_DEPARTMENT_OTHER): Payer: Self-pay | Admitting: Oncology

## 2014-04-26 ENCOUNTER — Ambulatory Visit (HOSPITAL_BASED_OUTPATIENT_CLINIC_OR_DEPARTMENT_OTHER): Payer: Self-pay

## 2014-04-26 ENCOUNTER — Other Ambulatory Visit (HOSPITAL_BASED_OUTPATIENT_CLINIC_OR_DEPARTMENT_OTHER): Payer: Self-pay

## 2014-04-26 ENCOUNTER — Telehealth: Payer: Self-pay | Admitting: Oncology

## 2014-04-26 VITALS — BP 125/81 | HR 74 | Temp 98.4°F | Resp 19 | Ht 61.0 in | Wt 274.7 lb

## 2014-04-26 DIAGNOSIS — C187 Malignant neoplasm of sigmoid colon: Secondary | ICD-10-CM

## 2014-04-26 DIAGNOSIS — D509 Iron deficiency anemia, unspecified: Secondary | ICD-10-CM

## 2014-04-26 DIAGNOSIS — Z5111 Encounter for antineoplastic chemotherapy: Secondary | ICD-10-CM

## 2014-04-26 DIAGNOSIS — L298 Other pruritus: Secondary | ICD-10-CM

## 2014-04-26 LAB — CBC WITH DIFFERENTIAL/PLATELET
BASO%: 0.7 % (ref 0.0–2.0)
Basophils Absolute: 0 10*3/uL (ref 0.0–0.1)
EOS%: 1 % (ref 0.0–7.0)
Eosinophils Absolute: 0 10*3/uL (ref 0.0–0.5)
HCT: 34.3 % — ABNORMAL LOW (ref 34.8–46.6)
HGB: 10.6 g/dL — ABNORMAL LOW (ref 11.6–15.9)
LYMPH%: 23.3 % (ref 14.0–49.7)
MCH: 25.8 pg (ref 25.1–34.0)
MCHC: 31 g/dL — ABNORMAL LOW (ref 31.5–36.0)
MCV: 83.4 fL (ref 79.5–101.0)
MONO#: 0.4 10*3/uL (ref 0.1–0.9)
MONO%: 8.1 % (ref 0.0–14.0)
NEUT#: 3.1 10*3/uL (ref 1.5–6.5)
NEUT%: 66.9 % (ref 38.4–76.8)
Platelets: 143 10*3/uL — ABNORMAL LOW (ref 145–400)
RBC: 4.11 10*6/uL (ref 3.70–5.45)
RDW: 21.4 % — ABNORMAL HIGH (ref 11.2–14.5)
WBC: 4.6 10*3/uL (ref 3.9–10.3)
lymph#: 1.1 10*3/uL (ref 0.9–3.3)

## 2014-04-26 LAB — COMPREHENSIVE METABOLIC PANEL (CC13)
ALT: 13 U/L (ref 0–55)
AST: 19 U/L (ref 5–34)
Albumin: 3.4 g/dL — ABNORMAL LOW (ref 3.5–5.0)
Alkaline Phosphatase: 143 U/L (ref 40–150)
Anion Gap: 6 mEq/L (ref 3–11)
BUN: 12 mg/dL (ref 7.0–26.0)
CO2: 24 mEq/L (ref 22–29)
Calcium: 9.1 mg/dL (ref 8.4–10.4)
Chloride: 108 mEq/L (ref 98–109)
Creatinine: 1 mg/dL (ref 0.6–1.1)
Glucose: 104 mg/dl (ref 70–140)
Potassium: 3.8 mEq/L (ref 3.5–5.1)
Sodium: 139 mEq/L (ref 136–145)
Total Bilirubin: 0.34 mg/dL (ref 0.20–1.20)
Total Protein: 6.6 g/dL (ref 6.4–8.3)

## 2014-04-26 MED ORDER — SODIUM CHLORIDE 0.9 % IV SOLN
2360.0000 mg/m2 | INTRAVENOUS | Status: DC
  Administered 2014-04-26: 5000 mg via INTRAVENOUS
  Filled 2014-04-26: qty 100

## 2014-04-26 MED ORDER — LEUCOVORIN CALCIUM INJECTION 350 MG
403.0000 mg/m2 | Freq: Once | INTRAMUSCULAR | Status: AC
  Administered 2014-04-26: 850 mg via INTRAVENOUS
  Filled 2014-04-26: qty 42.5

## 2014-04-26 MED ORDER — FLUOROURACIL CHEMO INJECTION 2.5 GM/50ML
400.0000 mg/m2 | Freq: Once | INTRAVENOUS | Status: AC
  Administered 2014-04-26: 850 mg via INTRAVENOUS
  Filled 2014-04-26: qty 17

## 2014-04-26 MED ORDER — DEXTROSE 5 % IV SOLN
Freq: Once | INTRAVENOUS | Status: AC
  Administered 2014-04-26: 14:00:00 via INTRAVENOUS

## 2014-04-26 MED ORDER — DIPHENHYDRAMINE HCL 50 MG/ML IJ SOLN: 25.0000 mg | Freq: Once | INTRAMUSCULAR | Status: DC

## 2014-04-26 MED ORDER — FAMOTIDINE IN NACL 20-0.9 MG/50ML-% IV SOLN: 20.0000 mg | Freq: Once | INTRAVENOUS | Status: DC

## 2014-04-26 NOTE — Progress Notes (Signed)
[place] OFFICE PROGRESS NOTE   Diagnosis: Colon cancer  INTERVAL HISTORY:   She completed another cycle of chemotherapy 04/12/2014. No nausea or diarrhea. She reports fatigue following chemotherapy. Occasional numbness and tingling in the extremities. No consistent neuropathy symptoms.  Objective:  Vital signs in last 24 hours:  Blood pressure 125/81, pulse 74, temperature 98.4 F (36.9 C), temperature source Oral, resp. rate 19, height _0  (1.549 m), weight 274 lb 11.2 oz (124.603 kg), SpO2 100.00%.    HEENT: No thrush or ulcers Resp: Lungs clear bilaterally Cardio: Regular rate and rhythm GI: No hepatomegaly, nontender Vascular: No leg edema Neuro: Mild to moderate decrease in vibratory sense at the fingertips bilaterally  Skin: Hyperpigmentation of the hands   Portacath/PICC-without erythema  Lab Results:  Lab Results  Component Value Date   WBC 9.4 04/12/2014   HGB 11.1* 04/12/2014   HCT 34.7* 04/12/2014   MCV 81.5 04/12/2014   PLT 160 04/12/2014   NEUTROABS 8.8* 04/12/2014    Medications: I have reviewed the patient's current medications.  Assessment/Plan: 1. Stage IIC (T4b, N0), adenocarcinoma the sigmoid colon, status post a partial colectomy and en bloc bladder resection 10/20/2013, negative surgical margins, no preoperative colonoscopy. No loss of mismatch repair protein expression. Microsatellite stable. Mildly elevated preoperative CEA.  CEA normal (1.0) on 11/09/2013.  Cycle 1 adjuvant FOLFOX 11/23/2013.  Cycle 2 adjuvant FOLFOX 12/07/2013.  Cycle 3 adjuvant FOLFOX 12/21/2013 (oxaliplatin dose reduced to 65 mg per meter squared due to neuropathy symptoms).  Cycle 4 adjuvant FOLFOX 01/04/2014.  Cycle 5 adjuvant FOLFOX 01/18/2014.  Cycle 6 adjuvant FOLFOX 02/01/2014-oxaliplatin discontinued prematurely secondary to an apparent hypersensitivity reaction.  Cycle 7 of adjuvant FOLFOX 02/15/2014.  Cycle 8 adjuvant FOLFOX 03/01/2014.    Cycle 9 adjuvant FOLFOX 03/15/2014. Oxaliplatin was held due to increased neuropathy symptoms Cycle 10 FOLFOX 03/29/2014 Cycle 11 FOLFOX 04/12/2014-oxaliplatin discontinued Cycle 12 FOLFOX 04/26/2014-oxaliplatin discontinued 2. Microcytic anemia-likely iron deficiency anemia. She continues oral iron. 3. Right breast nodule on the chest CT 11/14/2013.  Diagnostic mammogram and ultrasound 12/26/2013 showed a probable fibroadenoma within the inner right breast.  She discussed management options with the radiologist including excision, biopsy and short interval followup. She elected short interval followup. A followup ultrasound is recommended at 6, 12 and 24 months to assess stability. 4. Port-A-Cath placement 11/15/2013. 5. Delayed nausea following cycle 1 FOLFOX. Aloxi and Emend added with cycle 2 with improvement. 6. 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 Oxaliplatin was dose reduced to 65 mg per meter squared beginning with cycle 3. Increased neuropathy symptoms with persistent numbness/tingling in both feet following cycle 8 FOLFOX. Oxaliplatin held with cycle 9. Neuropathy symptoms improved 03/29/2014. Oxaliplatin resumed with cycle 10. Oxaliplatin discontinued with cycles 11 and 12 FOLFOX secondary to persistent neuropathy symptoms. 7. Ocular pruritus. Most likely related to 5-fluorouracil. She did not experience ocular pruritus following cycle 5 FOLFOX. She has intermittent ocular pruritus. 8. Hypersensitivity reaction to oxaliplatin with cycle 6 FOLFOX-she was premedicated with Decadron and given additional steroid/antihistamine premedication beginning with cycle 7 FOLFOX with no reaction.    Disposition:  She appears stable. The plan is to complete the final cycle of adjuvant chemotherapy today. We will followup on the CEA from today. Ms. Decoste will return for an office visit and Port-A-Cath flush in 6  weeks. We will decide on referring her for Port-A-Cath removal pending her status at that time.  Betsy Coder, MD  04/26/2014  12:33 PM

## 2014-04-26 NOTE — Patient Instructions (Signed)
[facility] Discharge Instructions for Patients Receiving Chemotherapy  Today you received the following chemotherapy agents Leucovorin, 5FU.  To help prevent nausea and vomiting after your treatment, we encourage you to take your nausea medication Compazine 10 mg as ordered.   If you develop nausea and vomiting that is not controlled by your nausea medication, call the clinic.   BELOW ARE SYMPTOMS THAT SHOULD BE REPORTED IMMEDIATELY:  *FEVER GREATER THAN 100.5 F  *CHILLS WITH OR WITHOUT FEVER  NAUSEA AND VOMITING THAT IS NOT CONTROLLED WITH YOUR NAUSEA MEDICATION  *UNUSUAL SHORTNESS OF BREATH  *UNUSUAL BRUISING OR BLEEDING  TENDERNESS IN MOUTH AND THROAT WITH OR WITHOUT PRESENCE OF ULCERS  *URINARY PROBLEMS  *BOWEL PROBLEMS  UNUSUAL RASH Items with * indicate a potential emergency and should be followed up as soon as possible.  Feel free to call the clinic you have any questions or concerns. The clinic phone number is [phone].

## 2014-04-26 NOTE — Progress Notes (Signed)
Discharged at 1650, ambulatory in no distress to home with mom, sister and family.  Final treatment today!

## 2014-04-26 NOTE — Progress Notes (Signed)
Patient reports "I do not get any pre-meds upon arrival and access of port-a-cath.  Awaiting chemotherapy meds.  0.9% NS IVF infusing.

## 2014-04-26 NOTE — Telephone Encounter (Signed)
gv pt appt schedule for oct and dec

## 2014-04-27 ENCOUNTER — Encounter: Payer: Self-pay | Admitting: Oncology

## 2014-04-27 LAB — CEA: CEA: 1.3 ng/mL (ref 0.0–5.0)

## 2014-04-27 NOTE — Progress Notes (Signed)
Spoke w/ pt regarding her insurance status.  Pt informed me that her husband started a new job and will be getting on his insurance once he's eligible to apply thru his job.  Regarding her current balance, she would like to apply for [employer] because she doesn't qualify for a discount thru the hospital anymore.  I gave her the number to pt accounting to apply for the [employer] card.

## 2014-04-28 ENCOUNTER — Ambulatory Visit (HOSPITAL_BASED_OUTPATIENT_CLINIC_OR_DEPARTMENT_OTHER): Payer: Self-pay

## 2014-04-28 VITALS — BP 135/83 | HR 86 | Temp 98.3°F | Resp 18

## 2014-04-28 DIAGNOSIS — C187 Malignant neoplasm of sigmoid colon: Secondary | ICD-10-CM

## 2014-04-28 MED ORDER — SODIUM CHLORIDE 0.9 % IJ SOLN
10.0000 mL | INTRAMUSCULAR | Status: DC | PRN
  Administered 2014-04-28: 10 mL
  Filled 2014-04-28: qty 10

## 2014-04-28 MED ORDER — HEPARIN SOD (PORK) LOCK FLUSH 100 UNIT/ML IV SOLN
500.0000 [IU] | Freq: Once | INTRAVENOUS | Status: AC | PRN
  Administered 2014-04-28: 500 [IU]
  Filled 2014-04-28: qty 5

## 2014-04-28 NOTE — Patient Instructions (Signed)
[facility] Discharge Instructions for Patients Receiving Chemotherapy  Today you received the following chemotherapy agents 5FU.  To help prevent nausea and vomiting after your treatment, we encourage you to take your nausea medication.   If you develop nausea and vomiting that is not controlled by your nausea medication, call the clinic.   BELOW ARE SYMPTOMS THAT SHOULD BE REPORTED IMMEDIATELY:  *FEVER GREATER THAN 100.5 F  *CHILLS WITH OR WITHOUT FEVER  NAUSEA AND VOMITING THAT IS NOT CONTROLLED WITH YOUR NAUSEA MEDICATION  *UNUSUAL SHORTNESS OF BREATH  *UNUSUAL BRUISING OR BLEEDING  TENDERNESS IN MOUTH AND THROAT WITH OR WITHOUT PRESENCE OF ULCERS  *URINARY PROBLEMS  *BOWEL PROBLEMS  UNUSUAL RASH Items with * indicate a potential emergency and should be followed up as soon as possible.  Feel free to call the clinic you have any questions or concerns. The clinic phone number is [phone].    

## 2014-04-30 ENCOUNTER — Encounter: Payer: Self-pay | Admitting: Genetic Counselor

## 2014-04-30 ENCOUNTER — Telehealth: Payer: Self-pay | Admitting: *Deleted

## 2014-04-30 NOTE — Telephone Encounter (Signed)
Provided patient with CEA results per her request.

## 2014-06-07 ENCOUNTER — Other Ambulatory Visit (HOSPITAL_BASED_OUTPATIENT_CLINIC_OR_DEPARTMENT_OTHER): Payer: Self-pay

## 2014-06-07 ENCOUNTER — Encounter: Payer: Self-pay | Admitting: Gastroenterology

## 2014-06-07 ENCOUNTER — Ambulatory Visit (HOSPITAL_BASED_OUTPATIENT_CLINIC_OR_DEPARTMENT_OTHER): Payer: Self-pay | Admitting: Nurse Practitioner

## 2014-06-07 ENCOUNTER — Ambulatory Visit (HOSPITAL_BASED_OUTPATIENT_CLINIC_OR_DEPARTMENT_OTHER): Payer: Self-pay

## 2014-06-07 ENCOUNTER — Telehealth: Payer: Self-pay | Admitting: Oncology

## 2014-06-07 VITALS — BP 143/71 | HR 73 | Temp 98.0°F | Resp 18 | Ht 61.0 in | Wt 285.0 lb

## 2014-06-07 DIAGNOSIS — C187 Malignant neoplasm of sigmoid colon: Secondary | ICD-10-CM

## 2014-06-07 DIAGNOSIS — Z95828 Presence of other vascular implants and grafts: Secondary | ICD-10-CM

## 2014-06-07 DIAGNOSIS — G620 Drug-induced polyneuropathy: Secondary | ICD-10-CM

## 2014-06-07 LAB — CBC WITH DIFFERENTIAL/PLATELET
BASO%: 0.2 % (ref 0.0–2.0)
Basophils Absolute: 0 10*3/uL (ref 0.0–0.1)
EOS%: 1.6 % (ref 0.0–7.0)
Eosinophils Absolute: 0.1 10*3/uL (ref 0.0–0.5)
HCT: 35.3 % (ref 34.8–46.6)
HGB: 11.1 g/dL — ABNORMAL LOW (ref 11.6–15.9)
LYMPH%: 21.3 % (ref 14.0–49.7)
MCH: 26.7 pg (ref 25.1–34.0)
MCHC: 31.4 g/dL — ABNORMAL LOW (ref 31.5–36.0)
MCV: 85.1 fL (ref 79.5–101.0)
MONO#: 0.3 10*3/uL (ref 0.1–0.9)
MONO%: 5.7 % (ref 0.0–14.0)
NEUT#: 4 10*3/uL (ref 1.5–6.5)
NEUT%: 71.2 % (ref 38.4–76.8)
Platelets: 202 10*3/uL (ref 145–400)
RBC: 4.15 10*6/uL (ref 3.70–5.45)
RDW: 17 % — ABNORMAL HIGH (ref 11.2–14.5)
WBC: 5.6 10*3/uL (ref 3.9–10.3)
lymph#: 1.2 10*3/uL (ref 0.9–3.3)

## 2014-06-07 MED ORDER — HEPARIN SOD (PORK) LOCK FLUSH 100 UNIT/ML IV SOLN
500.0000 [IU] | Freq: Once | INTRAVENOUS | Status: AC
  Administered 2014-06-07: 500 [IU] via INTRAVENOUS
  Filled 2014-06-07: qty 5

## 2014-06-07 MED ORDER — SODIUM CHLORIDE 0.9 % IJ SOLN
10.0000 mL | INTRAMUSCULAR | Status: DC | PRN
  Administered 2014-06-07: 10 mL via INTRAVENOUS
  Filled 2014-06-07: qty 10

## 2014-06-07 NOTE — Patient Instructions (Signed)

## 2014-06-07 NOTE — Telephone Encounter (Signed)
gv adn printeda ppt sched and avs for pt for Jan thru April 2016

## 2014-06-07 NOTE — Progress Notes (Signed)
Amy Bentley   Diagnosis:  Colon cancer  INTERVAL HISTORY:   Amy Bentley returns as scheduled. She completed the 12th and final cycle of FOLFOX on 04/26/2014. She has noted increased numbness/tingling in the hands and feet. Symptoms worsen with changes in the weather. Symptoms are not painful. She notes difficulty "opening things" with the left hand. She has a good appetite. No nausea or vomiting. No mouth sores. No diarrhea.  Objective:  Vital signs in last 24 hours:  Blood pressure 143/71, pulse 73, temperature 98 F (36.7 C), temperature source Oral, resp. rate 18, height 5' 1"  (1.549 m), weight 285 lb (129.275 kg).    HEENT: no thrush or ulcers. Lymphatics: no palpable cervical, supraclavicular, axillary or inguinal lymph nodes. Resp: lungs clear bilaterally. Cardio: regular rate and rhythm. GI: abdomen soft and nontender. No hepatomegaly. Vascular: no leg edema. Neuro: moderate decrease in vibratory sense over the fingertips bilaterally left greater than right.  Port-A-Cath without erythema.   Lab Results:  Lab Results  Component Value Date   WBC 5.6 06/07/2014   HGB 11.1* 06/07/2014   HCT 35.3 06/07/2014   MCV 85.1 06/07/2014   PLT 202 06/07/2014   NEUTROABS 4.0 06/07/2014    Imaging:  No results found.  Medications: I have reviewed the patient's current medications.  Assessment/Plan: 1. Stage IIC (T4b, N0), adenocarcinoma the sigmoid colon, status post a partial colectomy and en bloc bladder resection 10/20/2013, negative surgical margins, no preoperative colonoscopy. No loss of mismatch repair protein expression. Microsatellite stable.  Mildly elevated preoperative CEA.   CEA normal (1.0) on 11/09/2013.   Cycle 1 adjuvant FOLFOX 11/23/2013.   Cycle 2 adjuvant FOLFOX 12/07/2013.   Cycle 3 adjuvant FOLFOX 12/21/2013 (oxaliplatin dose reduced to 65 mg per meter squared due to neuropathy symptoms).   Cycle 4  adjuvant FOLFOX 01/04/2014.   Cycle 5 adjuvant FOLFOX 01/18/2014.   Cycle 6 adjuvant FOLFOX 02/01/2014-oxaliplatin discontinued prematurely secondary to an apparent hypersensitivity reaction.   Cycle 7 of adjuvant FOLFOX 02/15/2014.   Cycle 8 adjuvant FOLFOX 03/01/2014.   Cycle 9 adjuvant FOLFOX 03/15/2014. Oxaliplatin was held due to increased neuropathy symptoms  Cycle 10 FOLFOX 03/29/2014  Cycle 11 FOLFOX 04/12/2014-oxaliplatin discontinued  Cycle 12 FOLFOX 04/26/2014-oxaliplatin discontinued 2. Microcytic anemia-likely iron deficiency anemia. She continues oral iron. 3. Right breast nodule on the chest CT 11/14/2013.  Diagnostic mammogram and ultrasound 12/26/2013 showed a probable fibroadenoma within the inner right breast.   She discussed management options with the radiologist including excision, biopsy and short interval followup. She elected short interval followup. A followup ultrasound is recommended at 6, 12 and 24 months to assess stability. 4. Port-A-Cath placement 11/15/2013. 5. Delayed nausea following cycle 1 FOLFOX. Aloxi and Emend added with cycle 2 with improvement. 6. 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 Oxaliplatin was dose reduced to 65 mg per meter squared beginning with cycle 3. Increased neuropathy symptoms with persistent numbness/tingling in both feet following cycle 8 FOLFOX. Oxaliplatin held with cycle 9. Neuropathy symptoms improved 03/29/2014. Oxaliplatin resumed with cycle 10. Oxaliplatin discontinued with cycles 11 and 12 FOLFOX secondary to persistent neuropathy symptoms. 7. Ocular pruritus. Most likely related to 5-fluorouracil. She did not experience ocular pruritus following cycle 5 FOLFOX. She has intermittent ocular pruritus. 8. Hypersensitivity reaction to oxaliplatin with cycle 6 FOLFOX-she was premedicated with Decadron and given additional  steroid/antihistamine premedication beginning with cycle 7 FOLFOX with no reaction.  Disposition: Amy Bentley appears stable. She has completed the planned course of adjuvant chemotherapy. We are referring her for a colonoscopy. She would like to leave the Port-A-Cath in place for now. We will arrange for port flushes every 6 weeks. She will return for a followup visit in 4 months with a CEA and CT scans several days prior. She will contact the office in the interim with any problems.  Plan reviewed with Dr. Benay Spice.   Amy Bentley ANP/GNP-BC   06/07/2014  3:27 PM

## 2014-06-08 ENCOUNTER — Other Ambulatory Visit: Payer: Self-pay | Admitting: Oncology

## 2014-06-08 ENCOUNTER — Other Ambulatory Visit: Payer: Self-pay | Admitting: Obstetrics and Gynecology

## 2014-06-08 DIAGNOSIS — N63 Unspecified lump in unspecified breast: Secondary | ICD-10-CM

## 2014-07-02 ENCOUNTER — Ambulatory Visit
Admission: RE | Admit: 2014-07-02 | Discharge: 2014-07-02 | Disposition: A | Discharge status: Home / Self Care | Payer: No Typology Code available for payment source | Source: Ambulatory Visit | Attending: Obstetrics and Gynecology | Admitting: Obstetrics and Gynecology

## 2014-07-02 DIAGNOSIS — N63 Unspecified lump in unspecified breast: Secondary | ICD-10-CM

## 2014-07-20 ENCOUNTER — Telehealth: Payer: Self-pay | Admitting: *Deleted

## 2014-07-20 NOTE — Telephone Encounter (Signed)
Moved appt from tomorrow to next week

## 2014-07-21 ENCOUNTER — Ambulatory Visit: Payer: Self-pay

## 2014-07-28 ENCOUNTER — Ambulatory Visit (HOSPITAL_BASED_OUTPATIENT_CLINIC_OR_DEPARTMENT_OTHER): Payer: Self-pay

## 2014-07-28 DIAGNOSIS — Z452 Encounter for adjustment and management of vascular access device: Secondary | ICD-10-CM

## 2014-07-28 DIAGNOSIS — C187 Malignant neoplasm of sigmoid colon: Secondary | ICD-10-CM

## 2014-07-28 MED ORDER — HEPARIN SOD (PORK) LOCK FLUSH 100 UNIT/ML IV SOLN
500.0000 [IU] | Freq: Once | INTRAVENOUS | Status: AC
  Administered 2014-07-28: 500 [IU] via INTRAVENOUS
  Filled 2014-07-28: qty 5

## 2014-07-28 MED ORDER — SODIUM CHLORIDE 0.9 % IJ SOLN
10.0000 mL | INTRAMUSCULAR | Status: DC | PRN
  Administered 2014-07-28: 10 mL via INTRAVENOUS
  Filled 2014-07-28: qty 10

## 2014-07-28 MED ORDER — SODIUM CHLORIDE 0.9 % IJ SOLN
10.0000 mL | INTRAMUSCULAR | Status: DC | PRN
  Filled 2014-07-28: qty 10

## 2014-07-28 MED ORDER — HEPARIN SOD (PORK) LOCK FLUSH 100 UNIT/ML IV SOLN
500.0000 [IU] | Freq: Once | INTRAVENOUS | Status: DC
  Filled 2014-07-28: qty 5

## 2014-07-28 NOTE — Progress Notes (Signed)
Charting error.

## 2014-07-30 ENCOUNTER — Ambulatory Visit: Payer: Self-pay

## 2014-08-02 ENCOUNTER — Telehealth: Payer: Self-pay | Admitting: *Deleted

## 2014-08-02 NOTE — Telephone Encounter (Signed)
Dr Ardis Hughs, This pt is scheduled for a PV and then a direct colon with you on 2-23 Tuesday for a hx of sigmoid colon cancer with family hx of colon cancer as well. She has a BMI of 54. Do you want her to have an OV with APP  or is she okay for a direct at the hospital? Please advise and thanks for your time  Marijean Niemann

## 2014-08-03 ENCOUNTER — Other Ambulatory Visit: Payer: Self-pay

## 2014-08-03 DIAGNOSIS — Z1211 Encounter for screening for malignant neoplasm of colon: Secondary | ICD-10-CM

## 2014-08-03 NOTE — Telephone Encounter (Signed)
She can be done at hospital, directly.  I'll cc Patty as well.  She needs colonoscopy, MAC if available, add on to end of schedule next Thursday the 10th.  Thanks

## 2014-08-03 NOTE — Telephone Encounter (Signed)
Case is scheduled with MAC for 08/16/14 8:30 at [facility]

## 2014-08-06 NOTE — Telephone Encounter (Signed)
complete

## 2014-08-07 ENCOUNTER — Ambulatory Visit (AMBULATORY_SURGERY_CENTER): Payer: Self-pay | Admitting: *Deleted

## 2014-08-07 VITALS — Ht 61.0 in | Wt 292.4 lb

## 2014-08-07 DIAGNOSIS — Z85038 Personal history of other malignant neoplasm of large intestine: Secondary | ICD-10-CM

## 2014-08-07 MED ORDER — MOVIPREP 100 G PO SOLR: ORAL | Status: DC

## 2014-08-07 NOTE — Progress Notes (Signed)
No egg or soy allergy  No anesthesia or intubation problems per pt  No diet medications taken   

## 2014-08-13 ENCOUNTER — Encounter (HOSPITAL_COMMUNITY): Payer: Self-pay | Admitting: *Deleted

## 2014-08-16 ENCOUNTER — Ambulatory Visit (HOSPITAL_COMMUNITY): Payer: 59 | Admitting: Registered Nurse

## 2014-08-16 ENCOUNTER — Encounter (HOSPITAL_COMMUNITY)
Admission: RE | Disposition: A | Discharge status: Home / Self Care | Payer: Self-pay | Source: Ambulatory Visit | Attending: Gastroenterology

## 2014-08-16 ENCOUNTER — Ambulatory Visit (HOSPITAL_COMMUNITY)
Admission: RE | Admit: 2014-08-16 | Discharge: 2014-08-16 | Disposition: A | Discharge status: Home / Self Care | Payer: 59 | Source: Ambulatory Visit | Attending: Gastroenterology | Admitting: Gastroenterology

## 2014-08-16 ENCOUNTER — Encounter (HOSPITAL_COMMUNITY): Payer: Self-pay | Admitting: Registered Nurse

## 2014-08-16 DIAGNOSIS — Z9221 Personal history of antineoplastic chemotherapy: Secondary | ICD-10-CM | POA: Insufficient documentation

## 2014-08-16 DIAGNOSIS — Z906 Acquired absence of other parts of urinary tract: Secondary | ICD-10-CM | POA: Insufficient documentation

## 2014-08-16 DIAGNOSIS — K219 Gastro-esophageal reflux disease without esophagitis: Secondary | ICD-10-CM | POA: Insufficient documentation

## 2014-08-16 DIAGNOSIS — Z9049 Acquired absence of other specified parts of digestive tract: Secondary | ICD-10-CM | POA: Insufficient documentation

## 2014-08-16 DIAGNOSIS — Z9851 Tubal ligation status: Secondary | ICD-10-CM | POA: Insufficient documentation

## 2014-08-16 DIAGNOSIS — Z87891 Personal history of nicotine dependence: Secondary | ICD-10-CM | POA: Diagnosis not present

## 2014-08-16 DIAGNOSIS — Z800 Family history of malignant neoplasm of digestive organs: Secondary | ICD-10-CM | POA: Diagnosis not present

## 2014-08-16 DIAGNOSIS — Z85038 Personal history of other malignant neoplasm of large intestine: Secondary | ICD-10-CM | POA: Insufficient documentation

## 2014-08-16 DIAGNOSIS — Z1211 Encounter for screening for malignant neoplasm of colon: Secondary | ICD-10-CM

## 2014-08-16 DIAGNOSIS — Z87440 Personal history of urinary (tract) infections: Secondary | ICD-10-CM | POA: Insufficient documentation

## 2014-08-16 DIAGNOSIS — N189 Chronic kidney disease, unspecified: Secondary | ICD-10-CM | POA: Insufficient documentation

## 2014-08-16 DIAGNOSIS — Z8500 Personal history of malignant neoplasm of unspecified digestive organ: Secondary | ICD-10-CM

## 2014-08-16 DIAGNOSIS — Z08 Encounter for follow-up examination after completed treatment for malignant neoplasm: Secondary | ICD-10-CM | POA: Insufficient documentation

## 2014-08-16 DIAGNOSIS — E785 Hyperlipidemia, unspecified: Secondary | ICD-10-CM | POA: Diagnosis not present

## 2014-08-16 HISTORY — DX: Anemia, unspecified: D64.9

## 2014-08-16 HISTORY — PX: COLONOSCOPY WITH PROPOFOL: SHX5780

## 2014-08-16 HISTORY — PX: COLONOSCOPY: SHX174

## 2014-08-16 SURGERY — COLONOSCOPY WITH PROPOFOL
Anesthesia: Monitor Anesthesia Care

## 2014-08-16 MED ORDER — PROPOFOL 10 MG/ML IV BOLUS
INTRAVENOUS | Status: AC
  Filled 2014-08-16: qty 20

## 2014-08-16 MED ORDER — GLYCOPYRROLATE 0.2 MG/ML IJ SOLN
INTRAMUSCULAR | Status: AC
  Filled 2014-08-16: qty 1

## 2014-08-16 MED ORDER — GLYCOPYRROLATE 0.2 MG/ML IJ SOLN
INTRAMUSCULAR | Status: DC | PRN
  Administered 2014-08-16: 0.2 mg via INTRAVENOUS

## 2014-08-16 MED ORDER — LIDOCAINE HCL (CARDIAC) 20 MG/ML IV SOLN
INTRAVENOUS | Status: AC
  Filled 2014-08-16: qty 5

## 2014-08-16 MED ORDER — PROPOFOL 10 MG/ML IV BOLUS
INTRAVENOUS | Status: DC | PRN
  Administered 2014-08-16 (×2): 50 mg via INTRAVENOUS

## 2014-08-16 MED ORDER — SODIUM CHLORIDE 0.9 % IV SOLN
INTRAVENOUS | Status: DC
  Administered 2014-08-16: 1000 mL via INTRAVENOUS

## 2014-08-16 MED ORDER — PROPOFOL INFUSION 10 MG/ML OPTIME
INTRAVENOUS | Status: DC | PRN
  Administered 2014-08-16: 90 ug/kg/min via INTRAVENOUS

## 2014-08-16 MED ORDER — LACTATED RINGERS IV SOLN
INTRAVENOUS | Status: DC | PRN
  Administered 2014-08-16: 08:00:00 via INTRAVENOUS

## 2014-08-16 SURGICAL SUPPLY — 22 items

## 2014-08-16 NOTE — Anesthesia Postprocedure Evaluation (Signed)
  Anesthesia Post-op Note  Patient: [employer]  Procedure(s) Performed: Procedure(s): COLONOSCOPY WITH PROPOFOL (N/A)  Patient Location: PACU  Anesthesia Type:MAC  Level of Consciousness: awake and alert   Airway and Oxygen Therapy: Patient Spontanous Breathing  Post-op Pain: none  Post-op Assessment: Post-op Vital signs reviewed  Post-op Vital Signs: Reviewed  Last Vitals:  Filed Vitals:   08/16/14 0930  BP:   Pulse: 85  Temp:   Resp: 23    Complications: No apparent anesthesia complications

## 2014-08-16 NOTE — Anesthesia Preprocedure Evaluation (Addendum)
Anesthesia Evaluation  Patient identified by MRN, date of birth, ID band Patient awake    Reviewed: Allergy & Precautions, NPO status , Patient's Chart, lab work & pertinent test results  Airway Mallampati: II  TM Distance: >3 FB Neck ROM: Full    Dental  (+) Teeth Intact, Dental Advisory Given   Pulmonary former smoker,          Cardiovascular negative cardio ROS      Neuro/Psych negative neurological ROS     GI/Hepatic Neg liver ROS, GERD-  ,Hx colon ca   Endo/Other  Morbid obesity  Renal/GU negative Renal ROS     Musculoskeletal   Abdominal   Peds  Hematology negative hematology ROS (+)   Anesthesia Other Findings   Reproductive/Obstetrics                            Anesthesia Physical Anesthesia Plan  ASA: III  Anesthesia Plan: MAC   Post-op Pain Management:    Induction: Intravenous  Airway Management Planned: Natural Airway and Simple Face Mask  Additional Equipment:   Intra-op Plan:   Post-operative Plan:   Informed Consent: I have reviewed the patients History and Physical, chart, labs and discussed the procedure including the risks, benefits and alternatives for the proposed anesthesia with the patient or authorized representative who has indicated his/her understanding and acceptance.   Dental advisory given  Plan Discussed with: CRNA  Anesthesia Plan Comments:        Anesthesia Quick Evaluation

## 2014-08-16 NOTE — H&P (Signed)
  HPI: This is a 44 yo woman with T4 sigmoid cancer, invading into bladder s/p 2015 segmental colectomy, bladder surgery. Completed adjuvant chemo 5 months ago.  Was not able to have pre-op colonoscopy.    Past Medical History  Diagnosis Date  . GERD (gastroesophageal reflux disease)   . Hyperlipidemia   . Chronic kidney disease     hx UTIs  . Anemia   . Cancer     colon cancer    Past Surgical History  Procedure Laterality Date  . Cesarean section      x 2  . Colon resection N/A 10/20/2013    Procedure: OPEN SIGMOID COLONECTOMY COLOVESICAL FISTULA REPAIR;  Surgeon: Stark Klein, MD;  Location: [facility];  Service: General;  Laterality: N/A;  . Cystectomy N/A 10/20/2013    Procedure: CYSTECTOMY PARTIAL;  Surgeon: Ardis Hughs, MD;  Location: [facility];  Service: Urology;  Laterality: N/A;  . Tubal ligation    . Portacath placement N/A 11/15/2013    Procedure: INSERTION PORT-A-CATH;  Surgeon: Stark Klein, MD;  Location: [facility];  Service: General;  Laterality: N/A;    Current Facility-Administered Medications  Medication Dose Route Frequency Provider Last Rate Last Dose  . 0.9 %  sodium chloride infusion   Intravenous Continuous Milus Banister, MD        Allergies as of 08/03/2014 - Review Complete 06/07/2014  Allergen Reaction Noted  . Bactrim [sulfamethoxazole-trimethoprim]  10/17/2013    Family History  Problem Relation Age of Onset  . Cancer Paternal Aunt 66    colon  . Colon cancer Paternal Aunt   . Esophageal cancer Neg Hx   . Rectal cancer Neg Hx   . Stomach cancer Neg Hx     History   Social History  . Marital Status: Married    Spouse Name: N/A  . Number of Children: N/A  . Years of Education: N/A   Occupational History  . Not on file.   Social History Main Topics  . Smoking status: Former Smoker -- 0.50 packs/day    Types: Cigarettes    Quit date: 10/17/2013  . Smokeless tobacco: Never Used  . Alcohol Use: No  . Drug Use: No  . Sexual  Activity: No   Other Topics Concern  . Not on file   Social History Narrative      Physical Exam: BP 123/75 mmHg  Pulse 64  Temp(Src) 97.8 F (36.6 C) (Oral)  SpO2 100%  LMP 08/11/2014 Constitutional: generally well-appearing Psychiatric: alert and oriented x3 Abdomen: soft, nontender, nondistended, no obvious ascites, no peritoneal signs, normal bowel sounds     Assessment and plan: 44 y.o. female with personal history of colon cancer   Colonoscopy today.

## 2014-08-16 NOTE — Addendum Note (Signed)
Addendum  created 08/16/14 1431 by Lissa Morales, CRNA   Modules edited: Anesthesia Flowsheet

## 2014-08-16 NOTE — Discharge Instructions (Signed)
YOU HAD AN ENDOSCOPIC PROCEDURE TODAY: Refer to the procedure report that was given to you for any specific questions about what was found during the examination.  If the procedure report does not answer your questions, please call your gastroenterologist to clarify.  YOU SHOULD EXPECT: Some feelings of bloating in the abdomen. Passage of more gas than usual.  Walking can help get rid of the air that was put into your GI tract during the procedure and reduce the bloating. If you had a lower endoscopy (such as a colonoscopy or flexible sigmoidoscopy) you may notice spotting of blood in your stool or on the toilet paper.   DIET: Your first meal following the procedure should be a light meal and then it is ok to progress to your normal diet.  A half-sandwich or bowl of soup is an example of a good first meal.  Heavy or fried foods are harder to digest and may make you feel nasueas or bloated.  Drink plenty of fluids but you should avoid alcoholic beverages for 24 hours.  ACTIVITY: Your care partner should take you home directly after the procedure.  You should plan to take it easy, moving slowly for the rest of the day.  You can resume normal activity the day after the procedure however you should NOT DRIVE or use heavy machinery for 24 hours (because of the sedation medicines used during the test).    SYMPTOMS TO REPORT IMMEDIATELY  A gastroenterologist can be reached at any hour.  Please call your doctor's office for any of the following symptoms:   Following lower endoscopy (colonoscopy, flexible sigmoidoscopy)  Excessive amounts of blood in the stool  Significant tenderness, worsening of abdominal pains  Swelling of the abdomen that is new, acute  Fever of 100 or higher  Following upper endoscopy (EGD, EUS, ERCP)  Vomiting of blood or coffee ground material  New, significant abdominal pain  New, significant chest pain or pain under the shoulder blades  Painful or persistently difficult  swallowing  New shortness of breath  Black, tarry-looking stools  FOLLOW UP: If any biopsies were taken you will be contacted by phone or by letter within the next 1-3 weeks.  Call your gastroenterologist if you have not heard about the biopsies in 3 weeks.  Please also call your gastroenterologist's office with any specific questions about appointments or follow up tests.   Colonoscopy, Care After These instructions give you information on caring for yourself after your procedure. Your doctor may also give you more specific instructions. Call your doctor if you have any problems or questions after your procedure. HOME CARE  Do not drive for 24 hours.  Do not sign important papers or use machinery for 24 hours.  You may shower.  You may go back to your usual activities, but go slower for the first 24 hours.  Take rest breaks often during the first 24 hours.  Walk around or use warm packs on your belly (abdomen) if you have belly cramping or gas.  Drink enough fluids to keep your pee (urine) clear or pale yellow.  Resume your normal diet. Avoid heavy or fried foods.  Avoid drinking alcohol for 24 hours or as told by your doctor.  Only take medicines as told by your doctor. If a tissue sample (biopsy) was taken during the procedure:   Do not take aspirin or blood thinners for 7 days, or as told by your doctor.  Do not drink alcohol for 7 days, or  as told by your doctor.  Eat soft foods for the first 24 hours. GET HELP IF: You still have a small amount of blood in your poop (stool) 2-3 days after the procedure. GET HELP RIGHT AWAY IF:  You have more than a small amount of blood in your poop.  You see clumps of tissue (blood clots) in your poop.  Your belly is puffy (swollen).  You feel sick to your stomach (nauseous) or throw up (vomit).  You have a fever.  You have belly pain that gets worse and medicine does not help. MAKE SURE YOU:  Understand these  instructions.  Will watch your condition.  Will get help right away if you are not doing well or get worse. Document Released: 07/18/2010 Document Revised: 06/20/2013 Document Reviewed: 02/20/2013 [facility] Patient Information 2015 [place], [place]. This information is not intended to replace advice given to you by your health care provider. Make sure you discuss any questions you have with your health care provider.   Conscious Sedation, Adult, Care After Refer to this sheet in the next few weeks. These instructions provide you with information on caring for yourself after your procedure. Your health care provider may also give you more specific instructions. Your treatment has been planned according to current medical practices, but problems sometimes occur. Call your health care provider if you have any problems or questions after your procedure. WHAT TO EXPECT AFTER THE PROCEDURE  After your procedure:  You may feel sleepy, clumsy, and have poor balance for several hours.  Vomiting may occur if you eat too soon after the procedure. HOME CARE INSTRUCTIONS  Do not participate in any activities where you could become injured for at least 24 hours. Do not:  Drive.  Swim.  Ride a bicycle.  Operate heavy machinery.  Cook.  Use power tools.  Climb ladders.  Work from a high place.  Do not make important decisions or sign legal documents until you are improved.  If you vomit, drink water, juice, or soup when you can drink without vomiting. Make sure you have little or no nausea before eating solid foods.  Only take over-the-counter or prescription medicines for pain, discomfort, or fever as directed by your health care provider.  Make sure you and your family fully understand everything about the medicines given to you, including what side effects may occur.  You should not drink alcohol, take sleeping pills, or take medicines that cause drowsiness for at least 24 hours.  If  you smoke, do not smoke without supervision.  If you are feeling better, you may resume normal activities 24 hours after you were sedated.  Keep all appointments with your health care provider. SEEK MEDICAL CARE IF:  Your skin is pale or bluish in color.  You continue to feel nauseous or vomit.  Your pain is getting worse and is not helped by medicine.  You have bleeding or swelling.  You are still sleepy or feeling clumsy after 24 hours. SEEK IMMEDIATE MEDICAL CARE IF:  You develop a rash.  You have difficulty breathing.  You develop any type of allergic problem.  You have a fever. MAKE SURE YOU:  Understand these instructions.  Will watch your condition.  Will get help right away if you are not doing well or get worse. Document Released: 04/05/2013 Document Reviewed: 04/05/2013 [facility] Patient Information 2015 [place], [place]. This information is not intended to replace advice given to you by your health care provider. Make sure you discuss any  questions you have with your health care provider.

## 2014-08-16 NOTE — Transfer of Care (Signed)
Immediate Anesthesia Transfer of Care Note  Patient: Amy Bentley  Procedure(s) Performed: Procedure(s): COLONOSCOPY WITH PROPOFOL (N/A)  Patient Location: PACU  Anesthesia Type:MAC  Level of Consciousness: awake, alert , oriented and patient cooperative  Airway & Oxygen Therapy: Patient Spontanous Breathing and Patient connected to face mask oxygen  Post-op Assessment: Report given to RN, Post -op Vital signs reviewed and stable and Patient moving all extremities X 4  Post vital signs: stable  Last Vitals:  Filed Vitals:   08/16/14 0857  BP: 99/65  Pulse: 95  Temp: 36.6 C  Resp: 15    Complications: No apparent anesthesia complications

## 2014-08-16 NOTE — Op Note (Signed)
[facility] [place], [address]   COLONOSCOPY PROCEDURE REPORT  PATIENT: Amy Bentley, Amy Bentley  MR#: [id number] BIRTHDATE: [birth year] , 43  yrs. old GENDER: female ENDOSCOPIST: Milus Banister, MD REFERRED BX:IDHW Edrick Kins, M.D. PROCEDURE DATE:  08/16/2014 PROCEDURE:   Colonoscopy, diagnostic First Screening Colonoscopy - Avg.  risk and is 50 yrs.  old or older - No.  Prior Negative Screening - Now for repeat screening. N/A  History of Adenoma - Now for follow-up colonoscopy & has been > or = to 3 yrs.  N/A  Polyps Removed Today? No.  Recommend repeat exam, <10 yrs? Polyps Removed Today? No.  Recommend repeat exam, <10 yrs? Yes.  High risk (family or personal hx). ASA CLASS:   Class III INDICATIONS:T4N0 sigmoid cancer (invading urinary bladder) s/p 09/2013 segmental colon resection and same time bladder surgery; neoadjuvant chemo completed 03/2014; due to complexity of case, mircoperforation she was not able to undergo pre-op colonoscopy. MEDICATIONS: Monitored anesthesia care  DESCRIPTION OF PROCEDURE:   After the risks benefits and alternatives of the procedure were thoroughly explained, informed consent was obtained.  The digital rectal exam revealed no abnormalities of the rectum.   The Pentax Ped Colon [id number] endoscope was introduced through the anus and advanced to the cecum, which was identified by both the appendix and ileocecal valve. No adverse events experienced.   The quality of the prep was excellent.  The instrument was then slowly withdrawn as the colon was fully examined.   COLON FINDINGS: The colo-colonic anastomosis was normal appearing, located 25cm from anus.  The examination was otherwise normal. Retroflexed views revealed no abnormalities. The time to cecum= NA. Withdrawal time=NA.  The scope was withdrawn and the procedure completed. COMPLICATIONS: There were no immediate complications.  ENDOSCOPIC IMPRESSION: The  colo-colonic anastomosis was normal appearing, located 25cm from anus. The examination was otherwise normal  RECOMMENDATIONS: Repeat screening colonoscopy in 3 years.  eSigned:  Milus Banister, MD 08/16/2014 8:55 AM   cc: Stark Klein, MD

## 2014-08-17 ENCOUNTER — Encounter (HOSPITAL_COMMUNITY): Payer: Self-pay | Admitting: Gastroenterology

## 2014-08-21 ENCOUNTER — Encounter: Payer: Self-pay | Admitting: Gastroenterology

## 2014-08-31 ENCOUNTER — Other Ambulatory Visit: Payer: Self-pay | Admitting: Family Medicine

## 2014-08-31 DIAGNOSIS — E049 Nontoxic goiter, unspecified: Secondary | ICD-10-CM

## 2014-09-01 ENCOUNTER — Ambulatory Visit (HOSPITAL_BASED_OUTPATIENT_CLINIC_OR_DEPARTMENT_OTHER): Payer: 59

## 2014-09-01 VITALS — BP 134/75 | HR 72 | Temp 98.2°F | Resp 19

## 2014-09-01 DIAGNOSIS — C187 Malignant neoplasm of sigmoid colon: Secondary | ICD-10-CM

## 2014-09-01 DIAGNOSIS — R232 Flushing: Secondary | ICD-10-CM

## 2014-09-01 DIAGNOSIS — Z452 Encounter for adjustment and management of vascular access device: Secondary | ICD-10-CM

## 2014-09-01 MED ORDER — HEPARIN SOD (PORK) LOCK FLUSH 100 UNIT/ML IV SOLN
500.0000 [IU] | Freq: Once | INTRAVENOUS | Status: AC
  Administered 2014-09-01: 500 [IU] via INTRAVENOUS
  Filled 2014-09-01: qty 5

## 2014-09-01 MED ORDER — SODIUM CHLORIDE 0.9 % IJ SOLN
10.0000 mL | INTRAMUSCULAR | Status: DC | PRN
  Administered 2014-09-01: 10 mL via INTRAVENOUS
  Filled 2014-09-01: qty 10

## 2014-09-01 NOTE — Patient Instructions (Signed)

## 2014-09-14 ENCOUNTER — Ambulatory Visit
Admission: RE | Admit: 2014-09-14 | Discharge: 2014-09-14 | Disposition: A | Discharge status: Home / Self Care | Payer: 59 | Source: Ambulatory Visit | Attending: Family Medicine | Admitting: Family Medicine

## 2014-09-14 DIAGNOSIS — E049 Nontoxic goiter, unspecified: Secondary | ICD-10-CM

## 2014-09-26 ENCOUNTER — Telehealth: Payer: Self-pay | Admitting: *Deleted

## 2014-09-26 NOTE — Telephone Encounter (Signed)
Patient called and left message re:CT scan.  Part of message was unable to hear/decipher.  Part of message was question about CT scan and was it with or without contrast.  Called back and left message that CTscan - C/A/P where both with contrast.  If she has further questions, please call us back.

## 2014-10-01 ENCOUNTER — Ambulatory Visit (HOSPITAL_COMMUNITY)
Admission: RE | Admit: 2014-10-01 | Discharge: 2014-10-01 | Disposition: A | Discharge status: Home / Self Care | Payer: 59 | Source: Ambulatory Visit | Attending: Nurse Practitioner | Admitting: Nurse Practitioner

## 2014-10-01 ENCOUNTER — Telehealth: Payer: Self-pay | Admitting: Oncology

## 2014-10-01 ENCOUNTER — Encounter (HOSPITAL_COMMUNITY): Payer: Self-pay

## 2014-10-01 DIAGNOSIS — D649 Anemia, unspecified: Secondary | ICD-10-CM | POA: Diagnosis not present

## 2014-10-01 DIAGNOSIS — Z9221 Personal history of antineoplastic chemotherapy: Secondary | ICD-10-CM | POA: Insufficient documentation

## 2014-10-01 DIAGNOSIS — C187 Malignant neoplasm of sigmoid colon: Secondary | ICD-10-CM | POA: Insufficient documentation

## 2014-10-01 DIAGNOSIS — Z9889 Other specified postprocedural states: Secondary | ICD-10-CM | POA: Insufficient documentation

## 2014-10-01 MED ORDER — IOHEXOL 300 MG/ML  SOLN
100.0000 mL | Freq: Once | INTRAMUSCULAR | Status: AC | PRN
  Administered 2014-10-01: 100 mL via INTRAVENOUS

## 2014-10-01 NOTE — Telephone Encounter (Signed)
Per provider schedule MD visit r/s from 04/07 to 04/15, mailed schedule to pt and sent msg to pt through my chart..... KJ

## 2014-10-02 ENCOUNTER — Telehealth: Payer: Self-pay | Admitting: *Deleted

## 2014-10-02 NOTE — Telephone Encounter (Signed)
-----   Message from Ladell Pier, MD sent at 10/02/2014  1:48 PM EDT ----- Please call patient, CTs are negative for cancer

## 2014-10-02 NOTE — Telephone Encounter (Signed)
Left message for patient to call back regarding CT results

## 2014-10-02 NOTE — Telephone Encounter (Addendum)
Informed patient that CT is negative for cancer. Per Dr. Benay Spice.  Patient verbalized understanding.

## 2014-10-04 ENCOUNTER — Ambulatory Visit: Payer: Self-pay | Admitting: Oncology

## 2014-10-12 ENCOUNTER — Other Ambulatory Visit: Payer: 59

## 2014-10-12 ENCOUNTER — Other Ambulatory Visit (HOSPITAL_COMMUNITY)
Admission: RE | Admit: 2014-10-12 | Discharge: 2014-10-12 | Disposition: A | Discharge status: Home / Self Care | Payer: 59 | Source: Ambulatory Visit | Attending: Oncology | Admitting: Oncology

## 2014-10-12 ENCOUNTER — Ambulatory Visit (HOSPITAL_BASED_OUTPATIENT_CLINIC_OR_DEPARTMENT_OTHER): Payer: 59

## 2014-10-12 ENCOUNTER — Ambulatory Visit (HOSPITAL_BASED_OUTPATIENT_CLINIC_OR_DEPARTMENT_OTHER): Payer: 59 | Admitting: Oncology

## 2014-10-12 VITALS — BP 132/57 | HR 70 | Temp 97.8°F | Resp 19 | Ht 61.0 in | Wt 303.7 lb

## 2014-10-12 DIAGNOSIS — D509 Iron deficiency anemia, unspecified: Secondary | ICD-10-CM

## 2014-10-12 DIAGNOSIS — C187 Malignant neoplasm of sigmoid colon: Secondary | ICD-10-CM

## 2014-10-12 DIAGNOSIS — G620 Drug-induced polyneuropathy: Secondary | ICD-10-CM | POA: Diagnosis not present

## 2014-10-12 DIAGNOSIS — Z95828 Presence of other vascular implants and grafts: Secondary | ICD-10-CM

## 2014-10-12 DIAGNOSIS — C189 Malignant neoplasm of colon, unspecified: Secondary | ICD-10-CM

## 2014-10-12 LAB — COMPREHENSIVE METABOLIC PANEL
ALT: 16 U/L (ref 0–35)
AST: 20 U/L (ref 0–37)
Albumin: 4.1 g/dL (ref 3.5–5.2)
Alkaline Phosphatase: 114 U/L (ref 39–117)
Anion gap: 7 (ref 5–15)
BUN: 11 mg/dL (ref 6–23)
CO2: 27 mmol/L (ref 19–32)
Calcium: 8.9 mg/dL (ref 8.4–10.5)
Chloride: 103 mmol/L (ref 96–112)
Creatinine, Ser: 0.79 mg/dL (ref 0.50–1.10)
GFR calc Af Amer: >90 mL/min (ref >90)
GFR calc non Af Amer: >90 mL/min (ref >90)
Glucose, Bld: 90 mg/dL (ref 70–99)
Potassium: 3.8 mmol/L (ref 3.5–5.1)
Sodium: 137 mmol/L (ref 135–145)
Total Bilirubin: 0.4 mg/dL (ref 0.3–1.2)
Total Protein: 7.4 g/dL (ref 6.0–8.3)

## 2014-10-12 MED ORDER — HEPARIN SOD (PORK) LOCK FLUSH 100 UNIT/ML IV SOLN
500.0000 [IU] | Freq: Once | INTRAVENOUS | Status: DC
  Filled 2014-10-12: qty 5

## 2014-10-12 MED ORDER — HEPARIN SOD (PORK) LOCK FLUSH 100 UNIT/ML IV SOLN
500.0000 [IU] | Freq: Once | INTRAVENOUS | Status: AC
  Administered 2014-10-12: 500 [IU] via INTRAVENOUS
  Filled 2014-10-12: qty 5

## 2014-10-12 MED ORDER — SODIUM CHLORIDE 0.9 % IJ SOLN
10.0000 mL | INTRAMUSCULAR | Status: DC | PRN
  Filled 2014-10-12: qty 10

## 2014-10-12 MED ORDER — SODIUM CHLORIDE 0.9 % IJ SOLN
10.0000 mL | INTRAMUSCULAR | Status: DC | PRN
  Administered 2014-10-12: 10 mL via INTRAVENOUS
  Filled 2014-10-12: qty 10

## 2014-10-12 NOTE — Patient Instructions (Signed)

## 2014-10-12 NOTE — Progress Notes (Signed)
[facility] OFFICE PROGRESS NOTE   Diagnosis: Colon cancer  INTERVAL HISTORY:   She returns as scheduled. She feels well. Good appetite. She reports some improvement in the tingling in the hands and feet after starting vitamin D. The peripheral numbness does not interfere with activity.  Objective:  Vital signs in last 24 hours:  Blood pressure 132/57, pulse 70, temperature 97.8 F (36.6 C), temperature source Oral, resp. rate 19, height 5' 1" (1.549 m), weight 303 lb 11.2 oz (137.757 kg), last menstrual period 09/17/2014, SpO2 100 %.    HEENT: Neck without mass Lymphatics: No cervical, supraclavicular, axillary, or inguinal nodes Resp: Lungs clear bilaterally Cardio: Regular rate and rhythm GI: No hepatomegaly, no mass, reducible ventral hernia Vascular: No leg edema Breasts: Palpable fullness in the medial right breast, difficult to measure a discrete mass   Portacath/PICC-without erythema  Lab Results:   Lab Results  Component Value Date   CEA 1.3 04/26/2014    imaging: CTs of the chest, abdomen, and pelvis 10/01/2014-stable medial right breast mass,  Medications: I have reviewed the patient's current medications.  Assessment/Plan: 1. Stage IIC (T4b, N0), adenocarcinoma the sigmoid colon, status post a partial colectomy and en bloc bladder resection 10/20/2013, negative surgical margins, no preoperative colonoscopy. No loss of mismatch repair protein expression. Microsatellite stable.  Mildly elevated preoperative CEA.   CEA normal (1.0) on 11/09/2013.   Cycle 1 adjuvant FOLFOX 11/23/2013.   Cycle 2 adjuvant FOLFOX 12/07/2013.   Cycle 3 adjuvant FOLFOX 12/21/2013 (oxaliplatin dose reduced to 65 mg per meter squared due to neuropathy symptoms).   Cycle 4 adjuvant FOLFOX 01/04/2014.   Cycle 5 adjuvant FOLFOX 01/18/2014.   Cycle 6 adjuvant FOLFOX 02/01/2014-oxaliplatin discontinued prematurely secondary to an apparent hypersensitivity  reaction.   Cycle 7 of adjuvant FOLFOX 02/15/2014.   Cycle 8 adjuvant FOLFOX 03/01/2014.   Cycle 9 adjuvant FOLFOX 03/15/2014. Oxaliplatin was held due to increased neuropathy symptoms  Cycle 10 FOLFOX 03/29/2014  Cycle 11 FOLFOX 04/12/2014-oxaliplatin discontinued  Cycle 12 FOLFOX 04/26/2014-oxaliplatin discontinued  Negative surveillance colonoscopy 08/16/2014  CTs of the chest, abdomen, and pelvis 10/01/2014-negative for metastatic disease 2. Microcytic anemia-likely iron deficiency anemia. She continues oral iron. 3. Right breast nodule on the chest CT 11/14/2013.  Diagnostic mammogram and ultrasound 12/26/2013 showed a probable fibroadenoma within the inner right breast.   She discussed management options with the radiologist including excision, biopsy and short interval followup. She elected short interval followup. A followup ultrasound is recommended at 6, 12 and 24 months to assess stability. 4. Port-A-Cath placement 11/15/2013. 5. Delayed nausea following cycle 1 FOLFOX. Aloxi and Emend added with cycle 2 with improvement. 6. 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 Oxaliplatin was dose reduced to 65 mg per meter squared beginning with cycle 3. Increased neuropathy symptoms with persistent numbness/tingling in both feet following cycle 8 FOLFOX. Oxaliplatin held with cycle 9. Neuropathy symptoms improved 03/29/2014. Oxaliplatin resumed with cycle 10. Oxaliplatin discontinued with cycles 11 and 12 FOLFOX secondary to persistent neuropathy symptoms. 7. Ocular pruritus. Most likely related to 5-fluorouracil. She did not experience ocular pruritus following cycle 5 FOLFOX.  8. Hypersensitivity reaction to oxaliplatin with cycle 6 FOLFOX-she was premedicated with Decadron and given additional steroid/antihistamine premedication beginning with cycle 7 FOLFOX with no  reaction.     Disposition:  Ms. Lisenby remains in clinical remission from colon cancer. The Port-A-Cath will be flushed today and we will check a CEA.  She plans to see Dr. [name] in May and will ask to have the Port-A-Cath removed at that visit. She is being followed by Dr. [name] for the right breast mass.  Ms. Zaucha will return for an office visit and CEA in 6 months. Hopefully the neuropathy symptoms will continue to improve.  Betsy Coder, MD  10/12/2014  10:24 AM

## 2014-10-12 NOTE — Progress Notes (Signed)
Port-a -cath flushed easily but no blood return. Labs drawn from right [facility] per patient request.

## 2014-10-13 ENCOUNTER — Ambulatory Visit: Payer: Self-pay

## 2014-10-13 LAB — CEA: CEA: 1.2 ng/mL (ref 0.0–5.0)

## 2014-10-15 ENCOUNTER — Telehealth: Payer: Self-pay | Admitting: Oncology

## 2014-10-15 NOTE — Telephone Encounter (Signed)
Confirmed appointment for October. Mailed calendar

## 2014-10-30 ENCOUNTER — Other Ambulatory Visit: Payer: Self-pay

## 2014-10-30 DIAGNOSIS — N6310 Unspecified lump in the right breast, unspecified quadrant: Secondary | ICD-10-CM

## 2014-10-30 NOTE — Addendum Note (Signed)
Addended by: Stark Klein on: 10/30/2014 01:47 PM   Modules accepted: Orders

## 2014-10-31 ENCOUNTER — Other Ambulatory Visit: Payer: Self-pay | Admitting: General Surgery

## 2014-10-31 DIAGNOSIS — N6310 Unspecified lump in the right breast, unspecified quadrant: Secondary | ICD-10-CM

## 2014-11-08 ENCOUNTER — Ambulatory Visit
Admission: RE | Admit: 2014-11-08 | Discharge: 2014-11-08 | Disposition: A | Discharge status: Home / Self Care | Payer: 59 | Source: Ambulatory Visit | Attending: General Surgery | Admitting: General Surgery

## 2014-11-08 DIAGNOSIS — N6310 Unspecified lump in the right breast, unspecified quadrant: Secondary | ICD-10-CM

## 2014-11-09 ENCOUNTER — Telehealth: Payer: Self-pay

## 2014-11-24 IMAGING — CT CT CHEST W/O CM
2 of 4 series · 15 of 36 positions shown, 18 images · non-contrast
Comparison: Abdominal pelvic CT of 10/17/2013. Chest radiograph of
08/07/2013. No prior chest CT.

CLINICAL DATA: Colon cancer diagnosed [DATE]. Treatments to begin.
Partial colonic resection. Ex-smoker. No current chest complaints.

EXAM:
CT CHEST WITHOUT CONTRAST
TECHNIQUE: Multidetector CT imaging of the chest was performed following the
standard protocol without IV contrast..

[Series 2: chest w/o st · axial · non-contrast · 0.75mm/px · z∈[-312,-66]mm · 12 of 59 slices shown, 15 images]
[im 5/59  mediastinal]
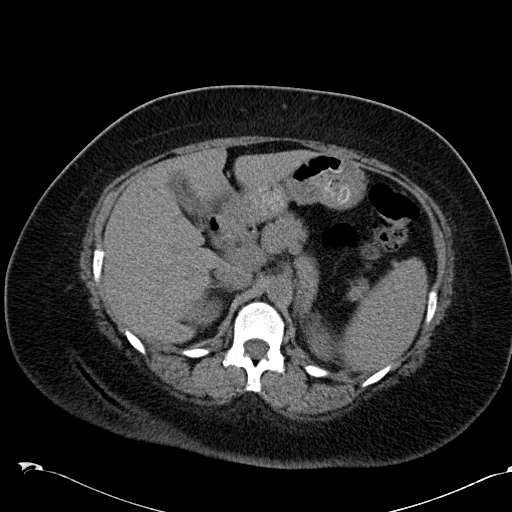
[im 5/59  lung]
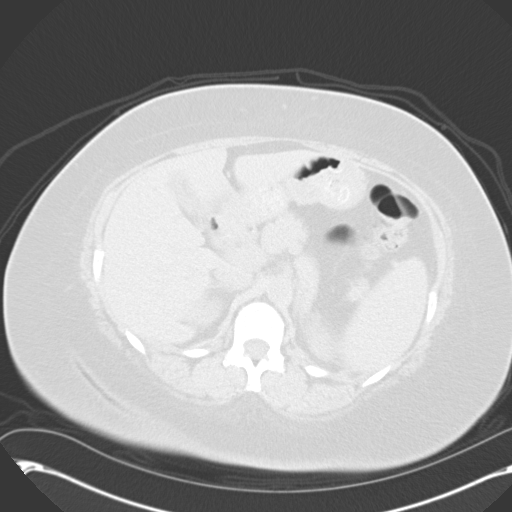
[im 9/59  lung]
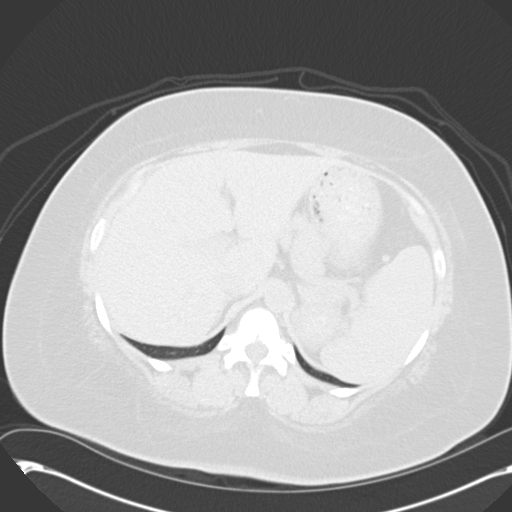
[im 14/59  lung]
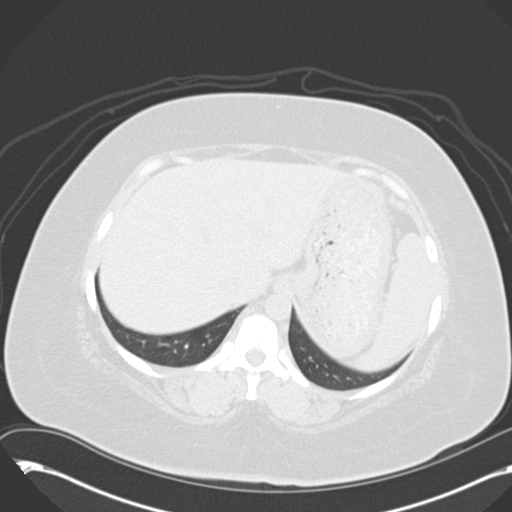
[im 18/59  lung]
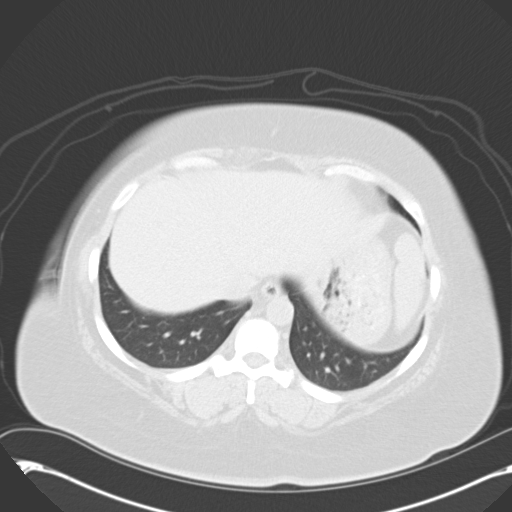
[im 23/59  mediastinal]
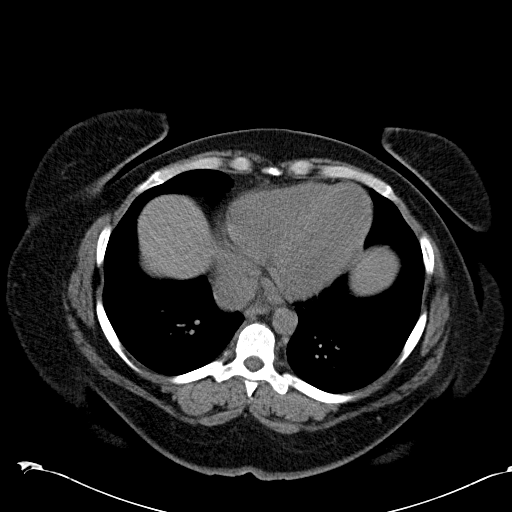
[im 23/59  lung]
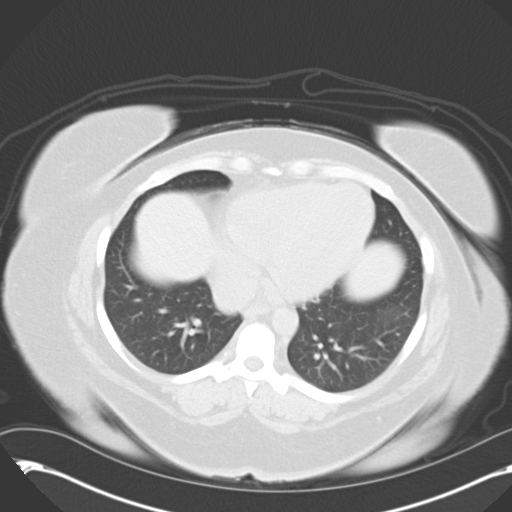
[im 27/59  lung]
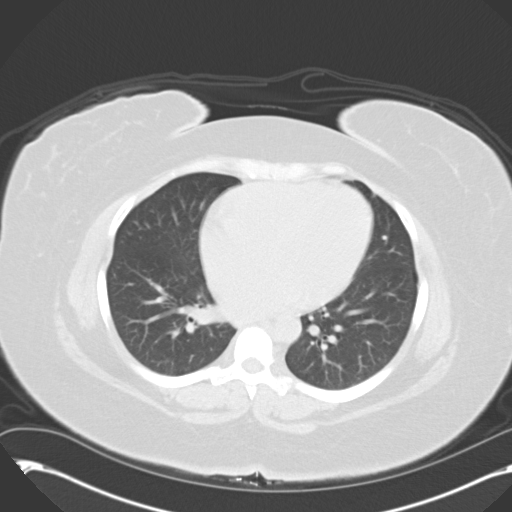
[im 32/59  lung]
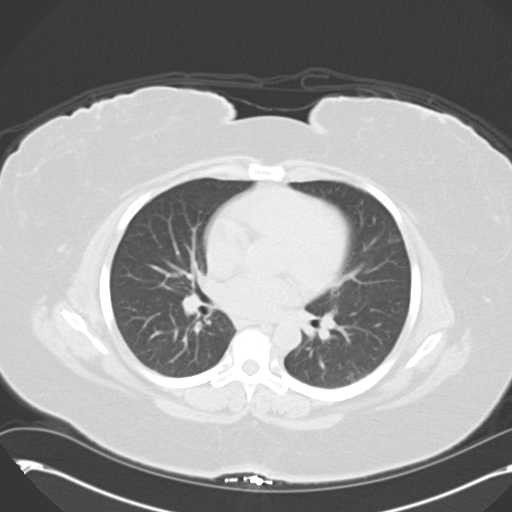
[im 36/59  lung]
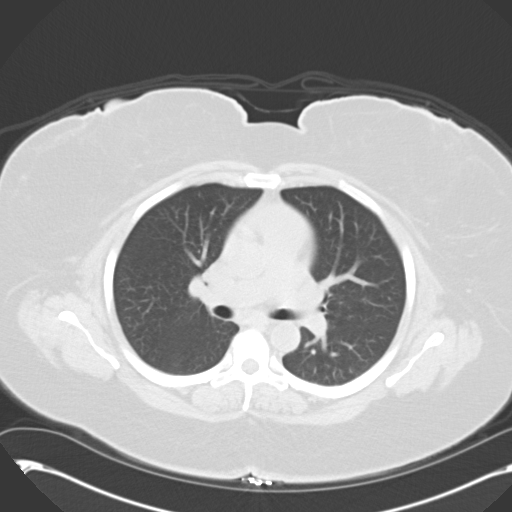
[im 41/59  mediastinal]
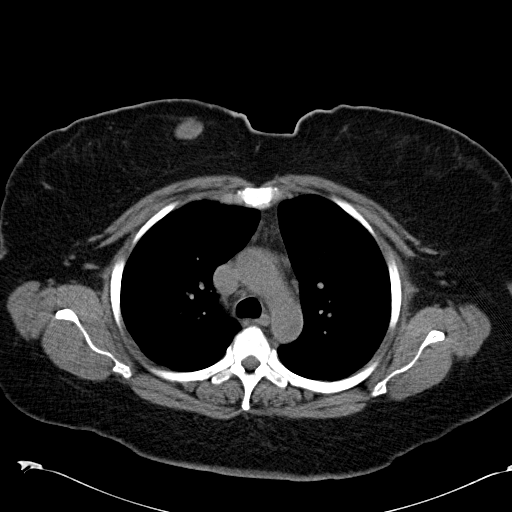
[im 41/59  lung]
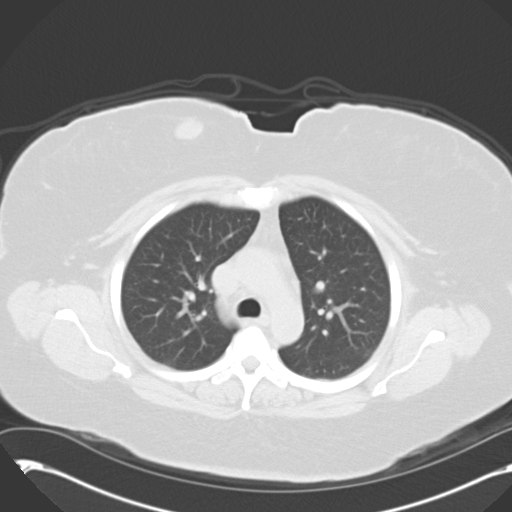
[im 45/59  lung]
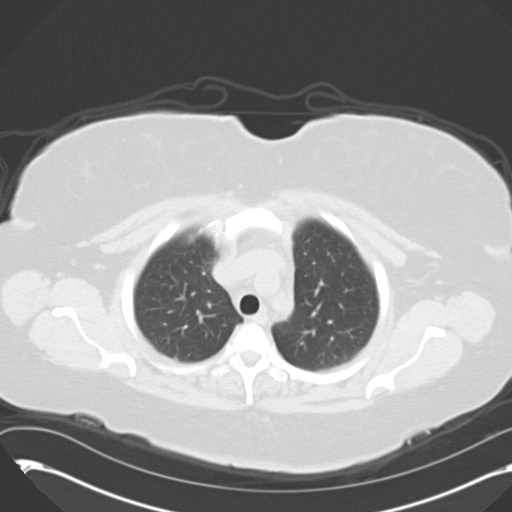
[im 50/59  lung]
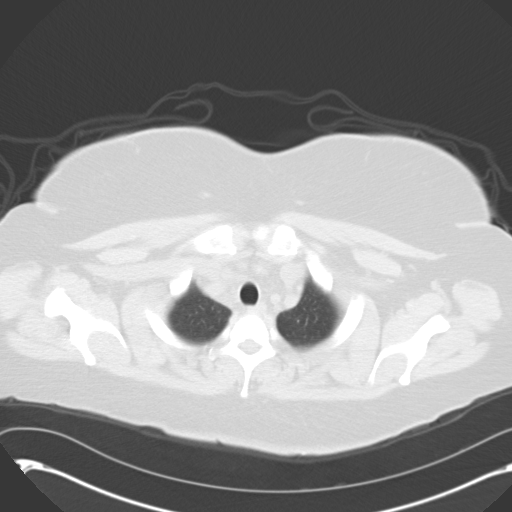
[im 54/59  lung]
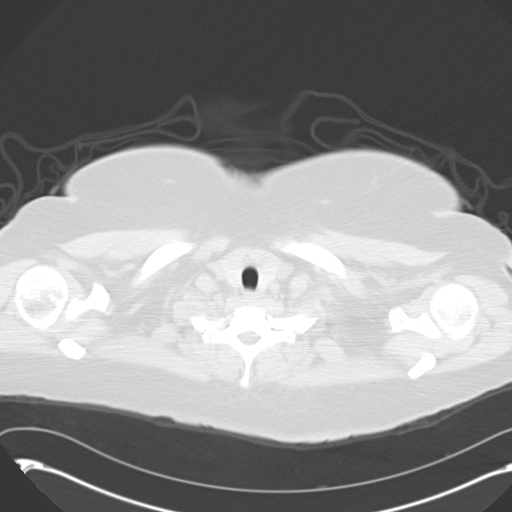

[Series 602: <mpr thick range> · coronal · 0.75mm/px · 3 of 86 slices shown]
[im 18/86  lung]
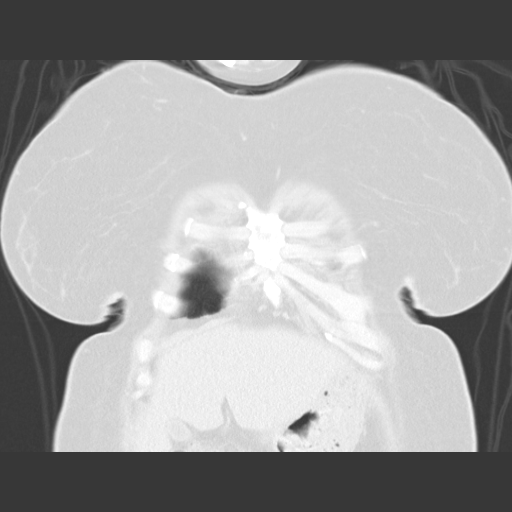
[im 35/86  lung]
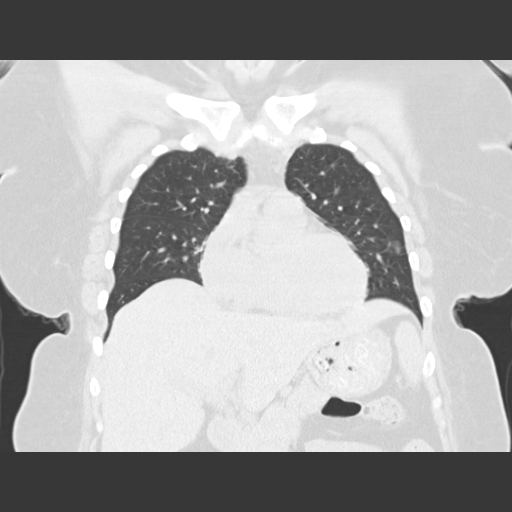
[im 52/86  lung]
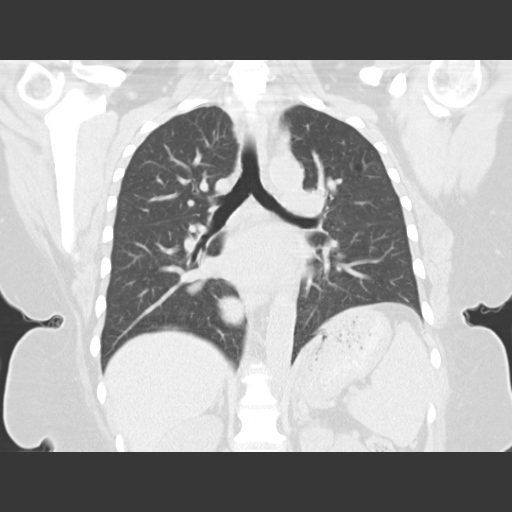

[15 of 36 positions shown; findings below may reference images not displayed]

FINDINGS: Lungs/Pleura:  No nodules or airspace opacities.

No pleural fluid.

Heart/Mediastinum: No supraclavicular adenopathy. A medial right
breast nodule measures 1.9 x 2.5 cm on image 20/series 2. No
axillary adenopathy. Bovine arch. Heart size upper normal, without
pericardial effusion. No mediastinal or definite hilar adenopathy,
given limitations of unenhanced CT.

Upper Abdomen: Normal imaged portions of the spleen. Focal steatosis
adjacent the falciform ligament. Normal imaged stomach, pancreas,
gallbladder, biliary tract, adrenal glands, and kidneys.

Bones/Musculoskeletal:  No acute osseous abnormality.
IMPRESSION: 1.  No acute process or evidence of metastatic disease in the chest.
2. Medial right breast nodule of 2.5 cm. Consider correlation with
diagnostic mammogram and possibly ultrasound.

## 2014-12-21 ENCOUNTER — Telehealth: Payer: Self-pay | Admitting: Oncology

## 2014-12-21 NOTE — Telephone Encounter (Signed)
Return call to patient re needing a flush appointment. Gave patient appointment for 6/28 @ 1:30 pm.

## 2014-12-25 ENCOUNTER — Ambulatory Visit (HOSPITAL_BASED_OUTPATIENT_CLINIC_OR_DEPARTMENT_OTHER): Payer: 59

## 2014-12-25 VITALS — BP 118/71 | HR 89 | Temp 98.2°F | Resp 18

## 2014-12-25 DIAGNOSIS — Z95828 Presence of other vascular implants and grafts: Secondary | ICD-10-CM

## 2014-12-25 DIAGNOSIS — C187 Malignant neoplasm of sigmoid colon: Secondary | ICD-10-CM | POA: Diagnosis not present

## 2014-12-25 DIAGNOSIS — Z452 Encounter for adjustment and management of vascular access device: Secondary | ICD-10-CM

## 2014-12-25 MED ORDER — HEPARIN SOD (PORK) LOCK FLUSH 100 UNIT/ML IV SOLN
500.0000 [IU] | Freq: Once | INTRAVENOUS | Status: AC
  Administered 2014-12-25: 500 [IU] via INTRAVENOUS
  Filled 2014-12-25: qty 5

## 2014-12-25 MED ORDER — SODIUM CHLORIDE 0.9 % IJ SOLN
10.0000 mL | INTRAMUSCULAR | Status: DC | PRN
  Administered 2014-12-25: 10 mL via INTRAVENOUS
  Filled 2014-12-25: qty 10

## 2014-12-25 NOTE — Patient Instructions (Signed)

## 2014-12-25 NOTE — Progress Notes (Signed)
Patient in for PAC flush. PAC accessed and flushed without any difficulty. No blood return noted with flush. Patient denies any pain or discomfort. No blood return noted after having Patient raise both arms and cough or lean forward. Patient stated, "They did not get blood from my port the last time I was here either." No blood return noted from [facility] per Infusion note on 10/12/2014. Informed Patient that an RN could administer Cathflo via her port, and explained to Patient why Cathflo is administered and Patient verbalized understanding. Patient states, "I will try that when I come back. I don't have time to do that today."  PAC de-accessed and Patient discharged from the Flush Room without any complaints.

## 2015-01-23 NOTE — Telephone Encounter (Signed)
Results

## 2015-04-12 ENCOUNTER — Other Ambulatory Visit: Payer: 59

## 2015-04-12 ENCOUNTER — Ambulatory Visit (HOSPITAL_BASED_OUTPATIENT_CLINIC_OR_DEPARTMENT_OTHER): Payer: 59 | Admitting: Nurse Practitioner

## 2015-04-12 ENCOUNTER — Telehealth: Payer: Self-pay | Admitting: Oncology

## 2015-04-12 ENCOUNTER — Telehealth: Payer: Self-pay | Admitting: Nurse Practitioner

## 2015-04-12 VITALS — BP 143/74 | HR 72 | Temp 98.2°F | Resp 18 | Ht 61.0 in | Wt 318.5 lb

## 2015-04-12 DIAGNOSIS — C187 Malignant neoplasm of sigmoid colon: Secondary | ICD-10-CM

## 2015-04-12 DIAGNOSIS — Z23 Encounter for immunization: Secondary | ICD-10-CM | POA: Diagnosis not present

## 2015-04-12 DIAGNOSIS — D509 Iron deficiency anemia, unspecified: Secondary | ICD-10-CM

## 2015-04-12 DIAGNOSIS — L299 Pruritus, unspecified: Secondary | ICD-10-CM | POA: Diagnosis not present

## 2015-04-12 MED ORDER — INFLUENZA VAC SPLIT QUAD 0.5 ML IM SUSY
0.5000 mL | PREFILLED_SYRINGE | Freq: Once | INTRAMUSCULAR | Status: AC
  Administered 2015-04-12: 0.5 mL via INTRAMUSCULAR
  Filled 2015-04-12: qty 0.5

## 2015-04-12 MED ORDER — SODIUM CHLORIDE 0.9 % IJ SOLN
10.0000 mL | INTRAMUSCULAR | Status: DC | PRN
  Administered 2015-04-12: 10 mL via INTRAVENOUS
  Filled 2015-04-12: qty 10

## 2015-04-12 MED ORDER — HEPARIN SOD (PORK) LOCK FLUSH 100 UNIT/ML IV SOLN
500.0000 [IU] | Freq: Once | INTRAVENOUS | Status: AC
  Administered 2015-04-12: 500 [IU] via INTRAVENOUS
  Filled 2015-04-12: qty 5

## 2015-04-12 NOTE — Telephone Encounter (Signed)
Gave patient avs report and appointments for November 2016 thru April 2017. Next port flush will be with TPA due to no blood return today - spoke with [facility].

## 2015-04-12 NOTE — Telephone Encounter (Signed)
Add to previous note. [facility] will call with ct appointment - patient aware.

## 2015-04-12 NOTE — Progress Notes (Signed)
[facility] OFF[facility] PROGRESS NOTE   Diagnosis:  Colon cancer  INTERVAL HISTORY:   Amy [facility] returns as scheduled. She feels well. No change in bowel habits. No bloody or black stools. She denies abdominal pain. No nausea or vomiting. Neuropathy symptoms are better, now occurring intermittently. She has a good appetite.  Objective:  Vital signs in last 24 hours:  Blood pressure 143/74, pulse 72, temperature 98.2 F (36.8 C), temperature source Oral, resp. rate 18, height 5' 1" (1.549 m), weight 318 lb 8 oz (144.471 kg), SpO2 100 %.    HEENT: No thrush or ulcers. Lymphatics: No palpable cervical, supra clavicular, axillary or inguinal lymph nodes. Resp: Lungs clear bilaterally. Cardio: Regular rate and rhythm. GI: Abdomen soft and nontender. No hepatomegaly. No mass. Soft, reducible ventral hernia. Vascular: No leg edema. Port-A-Cath without erythema.   Lab Results:  Lab Results  Component Value Date   WBC 5.6 06/07/2014   HGB 11.1* 06/07/2014   HCT 35.3 06/07/2014   MCV 85.1 06/07/2014   PLT 202 06/07/2014   NEUTROABS 4.0 06/07/2014    Imaging:  No results found.  Medications: I have reviewed the patient's current medications.  Assessment/Plan: 1. Stage IIC (T4b, N0), adenocarcinoma the sigmoid colon, status post a partial colectomy and en bloc bladder resection 10/20/2013, negative surgical margins, no preoperative colonoscopy. No loss of mismatch repair protein expression. Microsatellite stable.  Mildly elevated preoperative CEA.   CEA normal (1.0) on 11/09/2013.   Cycle 1 adjuvant FOLFOX 11/23/2013.   Cycle 2 adjuvant FOLFOX 12/07/2013.   Cycle 3 adjuvant FOLFOX 12/21/2013 (oxaliplatin dose reduced to 65 mg per meter squared due to neuropathy symptoms).   Cycle 4 adjuvant FOLFOX 01/04/2014.   Cycle 5 adjuvant FOLFOX 01/18/2014.   Cycle 6 adjuvant FOLFOX 02/01/2014-oxaliplatin discontinued prematurely secondary to an apparent  hypersensitivity reaction.   Cycle 7 of adjuvant FOLFOX 02/15/2014.   Cycle 8 adjuvant FOLFOX 03/01/2014.   Cycle 9 adjuvant FOLFOX 03/15/2014. Oxaliplatin was held due to increased neuropathy symptoms  Cycle 10 FOLFOX 03/29/2014  Cycle 11 FOLFOX 04/12/2014-oxaliplatin discontinued  Cycle 12 FOLFOX 04/26/2014-oxaliplatin discontinued  Negative surveillance colonoscopy 08/16/2014  CTs of the chest, abdomen, and pelvis 10/01/2014-negative for metastatic disease 2. Microcytic anemia-likely iron deficiency anemia. She continues oral iron. 3. Right breast nodule on the chest CT 11/14/2013.  Diagnostic mammogram and ultrasound 12/26/2013 showed a probable fibroadenoma within the inner right breast.   She discussed management options with the radiologist including excision, biopsy and short interval followup. She elected short interval followup. A followup ultrasound is recommended at 6, 12 and 24 months to assess stability.  11/08/2014 Needle core biopsy right breast 3:00 showed fibroadenoma. No atypia or malignancy identified. 4. Port-A-Cath placement 11/15/2013. 5. Delayed nausea following cycle 1 FOLFOX. Aloxi and Emend added with cycle 2 with improvement. 6. 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 Oxaliplatin was dose reduced to 65 mg per meter squared beginning with cycle 3. Increased neuropathy symptoms with persistent numbness/tingling in both feet following cycle 8 FOLFOX. Oxaliplatin held with cycle 9. Neuropathy symptoms improved 03/29/2014. Oxaliplatin resumed with cycle 10. Oxaliplatin discontinued with cycles 11 and 12 FOLFOX secondary to persistent neuropathy symptoms. 7. Ocular pruritus. Most likely related to 5-fluorouracil. She did not experience ocular pruritus following cycle 5 FOLFOX.  8. Hypersensitivity reaction to oxaliplatin with cycle 6 FOLFOX-she was premedicated with Decadron and  given additional steroid/antihistamine premedication beginning with cycle 7 FOLFOX with no  reaction.   Disposition: Amy Bentley remains in clinical remission from colon cancer. We will follow-up on the CEA from today.  The Port-A-Cath will be flushed today and then every 6 weeks. She plans to contact [facility] regarding Port-A-Cath removal.  She will return for a follow-up visit in 6 months with labs and restaging CT scans several days prior. She will contact the office in the interim with any problems.  We are administering a flu vaccine today.  Plan reviewed with [facility].  A[facility] ANP/GNP-BC   04/12/2015  10:37 AM

## 2015-04-12 NOTE — Patient Instructions (Signed)

## 2015-04-12 NOTE — Telephone Encounter (Signed)
As per Owens Shark Ms. Skarzynski's Port was accessed and flushed, there was no blood return. Pt. Stated they didn't get blood return the last time it was accessed , Asked Lashonya B. RN BSN to double check the port.,no blood return but port flushed. Informed Owens Shark NP and Dr. Benay Spice Stated it was ok the port is not being used.

## 2015-04-13 LAB — CEA: CEA: 0.5 ng/mL (ref 0.0–5.0)

## 2015-05-24 ENCOUNTER — Ambulatory Visit (HOSPITAL_BASED_OUTPATIENT_CLINIC_OR_DEPARTMENT_OTHER): Payer: 59

## 2015-05-24 VITALS — BP 121/74 | HR 70 | Temp 98.2°F | Resp 18

## 2015-05-24 DIAGNOSIS — C187 Malignant neoplasm of sigmoid colon: Secondary | ICD-10-CM | POA: Diagnosis not present

## 2015-05-24 DIAGNOSIS — Z452 Encounter for adjustment and management of vascular access device: Secondary | ICD-10-CM | POA: Diagnosis not present

## 2015-05-24 MED ORDER — SODIUM CHLORIDE 0.9 % IJ SOLN
10.0000 mL | INTRAMUSCULAR | Status: DC | PRN
  Administered 2015-05-24: 10 mL via INTRAVENOUS
  Filled 2015-05-24: qty 10

## 2015-05-24 MED ORDER — HEPARIN SOD (PORK) LOCK FLUSH 100 UNIT/ML IV SOLN
500.0000 [IU] | Freq: Once | INTRAVENOUS | Status: AC
  Administered 2015-05-24: 500 [IU] via INTRAVENOUS
  Filled 2015-05-24: qty 5

## 2015-07-05 ENCOUNTER — Ambulatory Visit: Payer: BLUE CROSS/BLUE SHIELD

## 2015-08-16 ENCOUNTER — Ambulatory Visit: Payer: BLUE CROSS/BLUE SHIELD

## 2015-10-08 ENCOUNTER — Other Ambulatory Visit (HOSPITAL_BASED_OUTPATIENT_CLINIC_OR_DEPARTMENT_OTHER): Payer: BLUE CROSS/BLUE SHIELD

## 2015-10-08 DIAGNOSIS — C187 Malignant neoplasm of sigmoid colon: Secondary | ICD-10-CM

## 2015-10-08 LAB — COMPREHENSIVE METABOLIC PANEL
ALT: 15 U/L (ref 0–55)
AST: 18 U/L (ref 5–34)
Albumin: 3.8 g/dL (ref 3.5–5.0)
Alkaline Phosphatase: 95 U/L (ref 40–150)
Anion Gap: 8 mEq/L (ref 3–11)
BUN: 12.9 mg/dL (ref 7.0–26.0)
CO2: 26 mEq/L (ref 22–29)
Calcium: 9.6 mg/dL (ref 8.4–10.4)
Chloride: 105 mEq/L (ref 98–109)
Creatinine: 0.9 mg/dL (ref 0.6–1.1)
EGFR: >90 mL/min/{1.73_m2} (ref >90)
Glucose: 92 mg/dl (ref 70–140)
Potassium: 4.2 mEq/L (ref 3.5–5.1)
Sodium: 139 mEq/L (ref 136–145)
Total Bilirubin: 0.32 mg/dL (ref 0.20–1.20)
Total Protein: 7.8 g/dL (ref 6.4–8.3)

## 2015-10-08 LAB — CBC WITH DIFFERENTIAL/PLATELET
BASO%: 0.3 % (ref 0.0–2.0)
Basophils Absolute: 0 10*3/uL (ref 0.0–0.1)
EOS%: 0.9 % (ref 0.0–7.0)
Eosinophils Absolute: 0.1 10*3/uL (ref 0.0–0.5)
HCT: 39.8 % (ref 34.8–46.6)
HGB: 13 g/dL (ref 11.6–15.9)
LYMPH%: 22.8 % (ref 14.0–49.7)
MCH: 27.3 pg (ref 25.1–34.0)
MCHC: 32.7 g/dL (ref 31.5–36.0)
MCV: 83.6 fL (ref 79.5–101.0)
MONO#: 0.4 10*3/uL (ref 0.1–0.9)
MONO%: 4.7 % (ref 0.0–14.0)
NEUT#: 5.5 10*3/uL (ref 1.5–6.5)
NEUT%: 71.3 % (ref 38.4–76.8)
Platelets: 226 10*3/uL (ref 145–400)
RBC: 4.76 10*6/uL (ref 3.70–5.45)
RDW: 15.1 % — ABNORMAL HIGH (ref 11.2–14.5)
WBC: 7.7 10*3/uL (ref 3.9–10.3)
lymph#: 1.8 10*3/uL (ref 0.9–3.3)

## 2015-10-09 ENCOUNTER — Telehealth: Payer: Self-pay | Admitting: Nurse Practitioner

## 2015-10-09 LAB — CEA (PARALLEL TESTING): CEA: 0.7 ng/mL

## 2015-10-09 LAB — CEA: CEA: 1.5 ng/mL (ref 0.0–4.7)

## 2015-10-09 NOTE — Telephone Encounter (Signed)
TC to Pt. As per Owens Shark NP to inform Pt. That CEA result was normal. Pt. Requested number value. Number value given. Pt. Verbalized understanding. No further questions or concerns at this time.

## 2015-10-09 NOTE — Telephone Encounter (Signed)
-----   Message from Owens Shark, NP sent at 10/09/2015  2:49 PM EDT ----- Please let her know CEA is normal.

## 2015-10-09 NOTE — Telephone Encounter (Deleted)
-----   Message from Owens Shark, NP sent at 10/09/2015  2:49 PM EDT ----- Please let her know CEA is normal.

## 2015-10-10 ENCOUNTER — Ambulatory Visit: Payer: 59 | Admitting: Oncology

## 2015-10-11 ENCOUNTER — Encounter (HOSPITAL_COMMUNITY): Payer: Self-pay

## 2015-10-11 ENCOUNTER — Ambulatory Visit (HOSPITAL_COMMUNITY)
Admission: RE | Admit: 2015-10-11 | Discharge: 2015-10-11 | Disposition: A | Discharge status: Home / Self Care | Payer: BLUE CROSS/BLUE SHIELD | Source: Ambulatory Visit | Attending: Nurse Practitioner | Admitting: Nurse Practitioner

## 2015-10-11 DIAGNOSIS — K439 Ventral hernia without obstruction or gangrene: Secondary | ICD-10-CM | POA: Insufficient documentation

## 2015-10-11 DIAGNOSIS — C187 Malignant neoplasm of sigmoid colon: Secondary | ICD-10-CM | POA: Diagnosis not present

## 2015-10-11 DIAGNOSIS — Z9889 Other specified postprocedural states: Secondary | ICD-10-CM | POA: Insufficient documentation

## 2015-10-11 DIAGNOSIS — C189 Malignant neoplasm of colon, unspecified: Secondary | ICD-10-CM | POA: Diagnosis not present

## 2015-10-11 MED ORDER — IOPAMIDOL (ISOVUE-300) INJECTION 61%
100.0000 mL | Freq: Once | INTRAVENOUS | Status: AC | PRN
  Administered 2015-10-11: 100 mL via INTRAVENOUS

## 2015-10-17 ENCOUNTER — Telehealth: Payer: Self-pay | Admitting: *Deleted

## 2015-10-17 NOTE — Telephone Encounter (Signed)
Called pt with CT results: Negative for cancer, per Dr. Benay Spice. She voiced appreciation for call. Confirmed next appt.

## 2015-10-17 NOTE — Telephone Encounter (Signed)
-----   Message from Ladell Pier, MD sent at 10/16/2015  9:09 PM EDT ----- Please call patient, CTs negative for cancer

## 2015-10-21 ENCOUNTER — Telehealth: Payer: Self-pay | Admitting: Oncology

## 2015-10-21 ENCOUNTER — Ambulatory Visit (HOSPITAL_BASED_OUTPATIENT_CLINIC_OR_DEPARTMENT_OTHER): Payer: BLUE CROSS/BLUE SHIELD | Admitting: Oncology

## 2015-10-21 VITALS — BP 121/84 | HR 83 | Temp 98.5°F | Resp 18 | Ht 61.0 in | Wt 317.0 lb

## 2015-10-21 DIAGNOSIS — D509 Iron deficiency anemia, unspecified: Secondary | ICD-10-CM | POA: Diagnosis not present

## 2015-10-21 DIAGNOSIS — C187 Malignant neoplasm of sigmoid colon: Secondary | ICD-10-CM | POA: Diagnosis not present

## 2015-10-21 DIAGNOSIS — G620 Drug-induced polyneuropathy: Secondary | ICD-10-CM | POA: Diagnosis not present

## 2015-10-21 NOTE — Telephone Encounter (Signed)
Gave and printed appt sched and avs fo rpt for OCT °

## 2015-10-21 NOTE — Progress Notes (Signed)
[facility] OFFICE PROGRESS NOTE   Diagnosis:  Colon cancer  INTERVAL HISTORY:    Amy [facility] Returns as scheduled. She feels well. Mild neuropathy symptoms remain in the feet. This does not interfere with activity. No other complaint.  Objective:  Vital signs in last 24 hours:  Blood pressure 121/84, pulse 83, temperature 98.5 F (36.9 C), temperature source Oral, resp. rate 18, height 5' 1"  (1.549 m), weight 317 lb (143.79 kg), last menstrual period 09/20/2015, SpO2 100 %.    HEENT:  Neck without mass Lymphatics:  No cervical, supra-clavicular, axillary, or inguinal nodes Resp:  Lungs clear bilaterally Cardio:  Regular rate and rhythm GI:  No hepatosplenomegaly, no mass, nontender , reducible ventral hernia Vascular:  No leg edema   Lab Results:  Lab Results  Component Value Date   WBC 7.7 10/08/2015   HGB 13.0 10/08/2015   HCT 39.8 10/08/2015   MCV 83.6 10/08/2015   PLT 226 10/08/2015   NEUTROABS 5.5 10/08/2015     Lab Results  Component Value Date   CEA1 1.5 10/08/2015    Medications: I have reviewed the patient's current medications.  Assessment/Plan: 1. Stage IIC (T4b, N0), adenocarcinoma the sigmoid colon, status post a partial colectomy and en bloc bladder resection 10/20/2013, negative surgical margins, no preoperative colonoscopy. No loss of mismatch repair protein expression. Microsatellite stable.  Mildly elevated preoperative CEA.   CEA normal (1.0) on 11/09/2013.   Cycle 1 adjuvant FOLFOX 11/23/2013.   Cycle 2 adjuvant FOLFOX 12/07/2013.   Cycle 3 adjuvant FOLFOX 12/21/2013 (oxaliplatin dose reduced to 65 mg per meter squared due to neuropathy symptoms).   Cycle 4 adjuvant FOLFOX 01/04/2014.   Cycle 5 adjuvant FOLFOX 01/18/2014.   Cycle 6 adjuvant FOLFOX 02/01/2014-oxaliplatin discontinued prematurely secondary to an apparent hypersensitivity reaction.   Cycle 7 of adjuvant FOLFOX 02/15/2014.   Cycle 8  adjuvant FOLFOX 03/01/2014.   Cycle 9 adjuvant FOLFOX 03/15/2014. Oxaliplatin was held due to increased neuropathy symptoms  Cycle 10 FOLFOX 03/29/2014  Cycle 11 FOLFOX 04/12/2014-oxaliplatin discontinued  Cycle 12 FOLFOX 04/26/2014-oxaliplatin discontinued  Negative surveillance colonoscopy 08/16/2014  CTs of the chest, abdomen, and pelvis 10/01/2014-negative for metastatic disease   CTs of the chest, abdomen, and pelvis 10/11/2015-negative for recurrent disease 2.  history ofMicrocytic anemia-likely iron deficiency anemia. She continues oral iron. 3. Right breast nodule on the chest CT 11/14/2013.  Diagnostic mammogram and ultrasound 12/26/2013 showed a probable fibroadenoma within the inner right breast.   She discussed management options with the radiologist including excision, biopsy and short interval followup. She elected short interval followup. A followup ultrasound is recommended at 6, 12 and 24 months to assess stability.  11/08/2014 Needle core biopsy right breast 3:00 showed fibroadenoma. No atypia or malignancy identified. 4. Port-A-Cath placement 11/15/2013. 5. Delayed nausea following cycle 1 FOLFOX. Aloxi and Emend added with cycle 2 with improvement. 6. 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 Oxaliplatin was dose reduced to 65 mg per meter squared beginning with cycle 3. Increased neuropathy symptoms with persistent numbness/tingling in both feet following cycle 8 FOLFOX. Oxaliplatin held with cycle 9. Neuropathy symptoms improved 03/29/2014. Oxaliplatin resumed with cycle 10. Oxaliplatin discontinued with cycles 11 and 12 FOLFOX secondary to persistent neuropathy symptoms. 7. Ocular pruritus. Most likely related to 5-fluorouracil. She did not experience ocular pruritus following cycle 5 FOLFOX.  8. Hypersensitivity reaction to oxaliplatin with cycle 6 FOLFOX-she was premedicated with  Decadron and given additional  steroid/antihistamine premedication beginning with cycle 7 FOLFOX with no reaction.   Disposition:   [facility] remains in clinical remission from colon cancer. She will return for an office visit and CEA in 6 months. She will be due for a surveillance colonoscopy in 2019.  Betsy Coder, MD  10/21/2015  12:44 PM

## 2015-11-19 DIAGNOSIS — Z124 Encounter for screening for malignant neoplasm of cervix: Secondary | ICD-10-CM | POA: Diagnosis not present

## 2015-11-19 DIAGNOSIS — Z01419 Encounter for gynecological examination (general) (routine) without abnormal findings: Secondary | ICD-10-CM | POA: Diagnosis not present

## 2015-11-19 DIAGNOSIS — R6882 Decreased libido: Secondary | ICD-10-CM | POA: Diagnosis not present

## 2015-11-19 DIAGNOSIS — Z1151 Encounter for screening for human papillomavirus (HPV): Secondary | ICD-10-CM | POA: Diagnosis not present

## 2015-11-19 DIAGNOSIS — N926 Irregular menstruation, unspecified: Secondary | ICD-10-CM | POA: Diagnosis not present

## 2015-12-13 DIAGNOSIS — N926 Irregular menstruation, unspecified: Secondary | ICD-10-CM | POA: Diagnosis not present

## 2015-12-16 ENCOUNTER — Other Ambulatory Visit: Payer: Self-pay | Admitting: Obstetrics and Gynecology

## 2015-12-16 DIAGNOSIS — Z1231 Encounter for screening mammogram for malignant neoplasm of breast: Secondary | ICD-10-CM

## 2015-12-17 DIAGNOSIS — K529 Noninfective gastroenteritis and colitis, unspecified: Secondary | ICD-10-CM | POA: Diagnosis not present

## 2015-12-20 ENCOUNTER — Ambulatory Visit (HOSPITAL_COMMUNITY)
Admission: EM | Admit: 2015-12-20 | Discharge: 2015-12-20 | Disposition: A | Discharge status: Home / Self Care | Payer: BLUE CROSS/BLUE SHIELD | Attending: Family Medicine | Admitting: Family Medicine

## 2015-12-20 ENCOUNTER — Encounter (HOSPITAL_COMMUNITY): Payer: Self-pay | Admitting: Emergency Medicine

## 2015-12-20 DIAGNOSIS — N3281 Overactive bladder: Secondary | ICD-10-CM | POA: Diagnosis not present

## 2015-12-20 LAB — POCT URINALYSIS DIP (DEVICE)
Bilirubin Urine: NEGATIVE
Glucose, UA: NEGATIVE mg/dL
Hgb urine dipstick: NEGATIVE
Ketones, ur: NEGATIVE mg/dL
Leukocytes, UA: NEGATIVE
Nitrite: NEGATIVE
Protein, ur: NEGATIVE mg/dL
Specific Gravity, Urine: 1.015 (ref 1.005–1.030)
Urobilinogen, UA: 0.2 mg/dL (ref 0.0–1.0)
pH: 5.5 (ref 5.0–8.0)

## 2015-12-20 NOTE — Discharge Instructions (Signed)
Return as needed

## 2015-12-20 NOTE — ED Notes (Signed)
Pt c/o poss UTI sx onset Tuesday associated w/urinary urgency, abd/back pain Reports hx of cystectomy and  CKD Denies fevers, chills... A&O x4... No acute distress.

## 2015-12-20 NOTE — ED Provider Notes (Signed)
CSN: [id number]     Arrival date & time 12/20/15  1605 History   First MD Initiated Contact with Patient 12/20/15 1709     Chief Complaint  Patient presents with  . Urinary Tract Infection   (Consider location/radiation/quality/duration/timing/severity/associated sxs/prior Treatment) Patient is a 45 y.o. female presenting with urinary tract infection. The history is provided by the patient.  Urinary Tract Infection Pain quality:  Burning Pain severity:  Mild Duration:  3 days Progression:  Improving Chronicity:  Chronic Recent urinary tract infections: yes   Relieved by:  None tried Worsened by:  Nothing tried Ineffective treatments:  None tried Urinary symptoms: hesitancy   Associated symptoms: no abdominal pain, no fever, no flank pain, no nausea and no vomiting   Risk factors: recurrent urinary tract infections and renal disease     Past Medical History  Diagnosis Date  . GERD (gastroesophageal reflux disease)   . Hyperlipidemia   . Chronic kidney disease     hx UTIs  . Anemia   . colon ca dx'd 09/2013   Past Surgical History  Procedure Laterality Date  . Cesarean section      x 2  . Colon resection N/A 10/20/2013    Procedure: OPEN SIGMOID COLONECTOMY COLOVESICAL FISTULA REPAIR;  Surgeon: Stark Klein, MD;  Location: [facility] ORS;  Service: General;  Laterality: N/A;  . Cystectomy N/A 10/20/2013    Procedure: CYSTECTOMY PARTIAL;  Surgeon: Ardis Hughs, MD;  Location: [facility] ORS;  Service: Urology;  Laterality: N/A;  . Tubal ligation    . Portacath placement N/A 11/15/2013    Procedure: INSERTION PORT-A-CATH;  Surgeon: Stark Klein, MD;  Location: [facility] ORS;  Service: General;  Laterality: N/A;  . Colonoscopy with propofol N/A 08/16/2014    Procedure: COLONOSCOPY WITH PROPOFOL;  Surgeon: Milus Banister, MD;  Location: [facility];  Service: Endoscopy;  Laterality: N/A;   Family History  Problem Relation Age of Onset  . Cancer Paternal Aunt 50    colon  . Colon cancer  Paternal Aunt   . Esophageal cancer Neg Hx   . Rectal cancer Neg Hx   . Stomach cancer Neg Hx    Social History  Substance Use Topics  . Smoking status: Former Smoker -- 0.50 packs/day    Types: Cigarettes    Quit date: 10/17/2013  . Smokeless tobacco: Never Used  . Alcohol Use: No   OB History    Gravida Para Term Preterm AB TAB SAB Ectopic Multiple Living   2 2 2       2      Review of Systems  Constitutional: Negative for fever.  Gastrointestinal: Negative for nausea, vomiting and abdominal pain.  Genitourinary: Positive for decreased urine volume. Negative for urgency and flank pain.  All other systems reviewed and are negative.   Allergies  Bactrim  Home Medications   Prior to Admission medications   Medication Sig Start Date End Date Taking? Authorizing Provider  Cranberry-Vitamin C-Probiotic (AZO CRANBERRY PO) Take 1 capsule by mouth daily.     Historical Provider, MD  ferrous fumarate (HEMOCYTE - 106 MG FE) 325 (106 FE) MG TABS tablet Take 1 tablet by mouth.    Historical Provider, MD  ibuprofen (ADVIL,MOTRIN) 200 MG tablet Take 600 mg by mouth every 6 (six) hours as needed for mild pain.    Historical Provider, MD  omeprazole (PRILOSEC OTC) 20 MG tablet Take 20 mg by mouth daily.    Historical Provider, MD   Meds Ordered and Administered  this Visit  Medications - No data to display  BP 125/82 mmHg  Pulse 90  Temp(Src) 98.4 F (36.9 C) (Oral)  Resp 18  SpO2 99% No data found.   Physical Exam  Constitutional: She is oriented to person, place, and time. She appears well-developed and well-nourished. No distress.  Abdominal: Soft. Bowel sounds are normal. There is no tenderness.  Neurological: She is alert and oriented to person, place, and time.  Skin: Skin is warm and dry.  Nursing note and vitals reviewed.   ED Course  Procedures (including critical care time)  Labs Review Labs Reviewed  POCT URINALYSIS DIP (DEVICE)    Imaging Review No results  found.   Visual Acuity Review  Right Eye Distance:   Left Eye Distance:   Bilateral Distance:    Right Eye Near:   Left Eye Near:    Bilateral Near:         MDM   1. OAB (overactive bladder)    Pt in agreement with nontreatment plans.will see lmd as needed.    Billy Fischer, MD 12/20/15 [phone]

## 2015-12-25 ENCOUNTER — Ambulatory Visit
Admission: RE | Admit: 2015-12-25 | Discharge: 2015-12-25 | Disposition: A | Discharge status: Home / Self Care | Payer: BLUE CROSS/BLUE SHIELD | Source: Ambulatory Visit | Attending: Obstetrics and Gynecology | Admitting: Obstetrics and Gynecology

## 2015-12-25 DIAGNOSIS — Z1231 Encounter for screening mammogram for malignant neoplasm of breast: Secondary | ICD-10-CM | POA: Diagnosis not present

## 2016-01-03 DIAGNOSIS — N920 Excessive and frequent menstruation with regular cycle: Secondary | ICD-10-CM | POA: Diagnosis not present

## 2016-01-03 DIAGNOSIS — N926 Irregular menstruation, unspecified: Secondary | ICD-10-CM | POA: Diagnosis not present

## 2016-04-21 ENCOUNTER — Telehealth: Payer: Self-pay | Admitting: Oncology

## 2016-04-21 ENCOUNTER — Other Ambulatory Visit (HOSPITAL_BASED_OUTPATIENT_CLINIC_OR_DEPARTMENT_OTHER): Payer: BLUE CROSS/BLUE SHIELD

## 2016-04-21 ENCOUNTER — Ambulatory Visit (HOSPITAL_BASED_OUTPATIENT_CLINIC_OR_DEPARTMENT_OTHER): Payer: BLUE CROSS/BLUE SHIELD | Admitting: Nurse Practitioner

## 2016-04-21 VITALS — BP 132/91 | HR 89 | Temp 98.7°F | Resp 20 | Ht 61.0 in | Wt 330.3 lb

## 2016-04-21 DIAGNOSIS — C187 Malignant neoplasm of sigmoid colon: Secondary | ICD-10-CM | POA: Diagnosis not present

## 2016-04-21 DIAGNOSIS — Z23 Encounter for immunization: Secondary | ICD-10-CM

## 2016-04-21 DIAGNOSIS — G620 Drug-induced polyneuropathy: Secondary | ICD-10-CM | POA: Diagnosis not present

## 2016-04-21 MED ORDER — INFLUENZA VAC SPLIT QUAD 0.5 ML IM SUSY
0.5000 mL | PREFILLED_SYRINGE | Freq: Once | INTRAMUSCULAR | Status: AC
  Administered 2016-04-21: 0.5 mL via INTRAMUSCULAR
  Filled 2016-04-21: qty 0.5

## 2016-04-21 NOTE — Progress Notes (Signed)
[facility] OFFICE PROGRESS NOTE   Diagnosis:  Colon cancer  INTERVAL HISTORY:   Amy Bentley returns as scheduled. She feels well. No change in bowel habits. No bloody or black stools. No abdominal pain. She has intermittent neuropathy symptoms in the feet. Symptoms do not interfere with activity.  She had recent GYN evaluation for irregular menstrual bleeding. She reports being found to have polyps.  Objective:  Vital signs in last 24 hours:  Blood pressure (!) 132/91, pulse 89, temperature 98.7 F (37.1 C), temperature source Oral, resp. rate 20, height 5\' 1"  (1.549 m), weight (!) 330 lb 4.8 oz (149.8 kg), SpO2 100 %.    HEENT: No neck mass. Lymphatics: No palpable cervical, supra clavicular, axillary or inguinal lymph nodes. Resp: Lungs clear bilaterally. Cardio: Regular rate and rhythm. GI: Abdomen soft and nontender. No hepatomegaly. No mass. Reducible ventral hernia. Vascular: No leg edema.   Lab Results:  Lab Results  Component Value Date   WBC 7.7 10/08/2015   HGB 13.0 10/08/2015   HCT 39.8 10/08/2015   MCV 83.6 10/08/2015   PLT 226 10/08/2015   NEUTROABS 5.5 10/08/2015    Imaging:  No results found.  Medications: I have reviewed the patient's current medications.  Assessment/Plan: 1. Stage IIC (T4b, N0), adenocarcinoma the sigmoid colon, status post a partial colectomy and en bloc bladder resection 10/20/2013, negative surgical margins, no preoperative colonoscopy. No loss of mismatch repair protein expression. Microsatellite stable.  Mildly elevated preoperative CEA.   CEA normal (1.0) on 11/09/2013.   Cycle 1 adjuvant FOLFOX 11/23/2013.   Cycle 2 adjuvant FOLFOX 12/07/2013.   Cycle 3 adjuvant FOLFOX 12/21/2013 (oxaliplatin dose reduced to 65 mg per meter squared due to neuropathy symptoms).   Cycle 4 adjuvant FOLFOX 01/04/2014.   Cycle 5 adjuvant FOLFOX 01/18/2014.   Cycle 6 adjuvant FOLFOX 02/01/2014-oxaliplatin  discontinued prematurely secondary to an apparent hypersensitivity reaction.   Cycle 7 of adjuvant FOLFOX 02/15/2014.   Cycle 8 adjuvant FOLFOX 03/01/2014.   Cycle 9 adjuvant FOLFOX 03/15/2014. Oxaliplatin was held due to increased neuropathy symptoms  Cycle 10 FOLFOX 03/29/2014  Cycle 11 FOLFOX 04/12/2014-oxaliplatin discontinued  Cycle 12 FOLFOX 04/26/2014-oxaliplatin discontinued  Negative surveillance colonoscopy 08/16/2014  CTs of the chest, abdomen, and pelvis 10/01/2014-negative for metastatic disease   CTs of the chest, abdomen, and pelvis 10/11/2015-negative for recurrent disease 2.  history ofMicrocytic anemia-likely iron deficiency anemia. She continues oral iron. 3. Right breast nodule on the chest CT 11/14/2013.  Diagnostic mammogram and ultrasound 12/26/2013 showed a probable fibroadenoma within the inner right breast.   She discussed management options with the radiologist including excision, biopsy and short interval followup. She elected short interval followup. A followup ultrasound is recommended at 6, 12 and 24 months to assess stability.  11/08/2014 Needle core biopsy right breast 3:00 showed fibroadenoma. No atypia or malignancy identified. 4. Port-A-Cath placement 11/15/2013. 5. Delayed nausea following cycle 1 FOLFOX. Aloxi and Emend added with cycle 2 with improvement. 6. 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 Oxaliplatin was dose reduced to 65 mg per meter squared beginning with cycle 3. Increased neuropathy symptoms with persistent numbness/tingling in both feet following cycle 8 FOLFOX. Oxaliplatin held with cycle 9. Neuropathy symptoms improved 03/29/2014. Oxaliplatin resumed with cycle 10. Oxaliplatin discontinued with cycles 11 and 12 FOLFOX secondary to persistent neuropathy symptoms. 7. Ocular pruritus. Most likely related to 5-fluorouracil. She did not experience  ocular pruritus following cycle 5 FOLFOX.  8. Hypersensitivity reaction to oxaliplatin with cycle 6 FOLFOX-she was premedicated with Decadron and given additional steroid/antihistamine premedication beginning with cycle 7 FOLFOX with no reaction.   Disposition: Ms. Olthoff remains in clinical remission from colon cancer. We will follow-up on the CEA from today. She will return for a follow-up visit in 6 months with labs and CT scans a few days prior. She will contact the office in the interim with any problems.    Ned Card ANP/GNP-BC   04/21/2016  3:48 PM

## 2016-04-21 NOTE — Telephone Encounter (Signed)
Appointments scheduled per 10/24 LOS. AVS report and calendar of future scheduled appointments given to the patient. Patient informed about scan. Patient requested to return to obtain prep.

## 2016-04-22 LAB — CEA (IN HOUSE-CHCC): CEA (CHCC-In House): 1.97 ng/mL (ref 0.00–5.00)

## 2016-04-22 LAB — CEA: CEA: 1.7 ng/mL (ref 0.0–4.7)

## 2016-04-23 ENCOUNTER — Telehealth: Payer: Self-pay

## 2016-04-23 NOTE — Telephone Encounter (Signed)
-----   Message from Owens Shark, NP sent at 04/23/2016  8:54 AM EDT ----- Please let her know CEA is normal.

## 2016-04-23 NOTE — Telephone Encounter (Signed)
Called and informed pt of normal CEA result, pt verbalized understanding and denies any questions or concerns at this time

## 2016-07-17 ENCOUNTER — Other Ambulatory Visit: Payer: Self-pay | Admitting: Nurse Practitioner

## 2016-10-16 ENCOUNTER — Telehealth: Payer: Self-pay | Admitting: Oncology

## 2016-10-16 NOTE — Telephone Encounter (Signed)
Patient called to say that her husban, Goldy Calandra was on his way to pick up 2 bottles of contrast for her.

## 2016-10-20 ENCOUNTER — Other Ambulatory Visit (HOSPITAL_BASED_OUTPATIENT_CLINIC_OR_DEPARTMENT_OTHER): Payer: BLUE CROSS/BLUE SHIELD

## 2016-10-20 ENCOUNTER — Ambulatory Visit (HOSPITAL_COMMUNITY)
Admission: RE | Admit: 2016-10-20 | Discharge: 2016-10-20 | Disposition: A | Discharge status: Home / Self Care | Payer: BLUE CROSS/BLUE SHIELD | Source: Ambulatory Visit | Attending: Nurse Practitioner | Admitting: Nurse Practitioner

## 2016-10-20 DIAGNOSIS — C187 Malignant neoplasm of sigmoid colon: Secondary | ICD-10-CM | POA: Insufficient documentation

## 2016-10-20 DIAGNOSIS — K429 Umbilical hernia without obstruction or gangrene: Secondary | ICD-10-CM | POA: Diagnosis not present

## 2016-10-20 DIAGNOSIS — N6310 Unspecified lump in the right breast, unspecified quadrant: Secondary | ICD-10-CM | POA: Diagnosis not present

## 2016-10-20 LAB — COMPREHENSIVE METABOLIC PANEL
ALT: 14 U/L (ref 0–55)
AST: 18 U/L (ref 5–34)
Albumin: 4 g/dL (ref 3.5–5.0)
Alkaline Phosphatase: 102 U/L (ref 40–150)
Anion Gap: 10 mEq/L (ref 3–11)
BUN: 13.4 mg/dL (ref 7.0–26.0)
CO2: 26 mEq/L (ref 22–29)
Calcium: 10 mg/dL (ref 8.4–10.4)
Chloride: 103 mEq/L (ref 98–109)
Creatinine: 0.9 mg/dL (ref 0.6–1.1)
EGFR: 86 mL/min/{1.73_m2} — ABNORMAL LOW (ref >90)
Glucose: 101 mg/dl (ref 70–140)
Potassium: 4.3 mEq/L (ref 3.5–5.1)
Sodium: 139 mEq/L (ref 136–145)
Total Bilirubin: 0.3 mg/dL (ref 0.20–1.20)
Total Protein: 7.9 g/dL (ref 6.4–8.3)

## 2016-10-20 LAB — CEA (IN HOUSE-CHCC): CEA (CHCC-In House): 1.55 ng/mL (ref 0.00–5.00)

## 2016-10-20 MED ORDER — IOPAMIDOL (ISOVUE-300) INJECTION 61%
INTRAVENOUS | Status: AC
  Administered 2016-10-20: 100 mL
  Filled 2016-10-20: qty 100

## 2016-10-21 LAB — CEA: CEA: 1.7 ng/mL (ref 0.0–4.7)

## 2016-10-22 ENCOUNTER — Ambulatory Visit (HOSPITAL_BASED_OUTPATIENT_CLINIC_OR_DEPARTMENT_OTHER): Payer: BLUE CROSS/BLUE SHIELD | Admitting: Oncology

## 2016-10-22 ENCOUNTER — Telehealth: Payer: Self-pay | Admitting: Oncology

## 2016-10-22 VITALS — BP 129/76 | HR 95 | Temp 98.3°F | Resp 18 | Ht 61.0 in | Wt 334.3 lb

## 2016-10-22 DIAGNOSIS — G620 Drug-induced polyneuropathy: Secondary | ICD-10-CM | POA: Diagnosis not present

## 2016-10-22 DIAGNOSIS — C187 Malignant neoplasm of sigmoid colon: Secondary | ICD-10-CM

## 2016-10-22 NOTE — Progress Notes (Signed)
error 

## 2016-10-22 NOTE — Telephone Encounter (Signed)
Gave patient AVS and calender - Sent fax information to [facility]  For Dr. [name]

## 2016-10-22 NOTE — Progress Notes (Signed)
[place] OFFICE PROGRESS NOTE   Diagnosis: Colon cancer  INTERVAL HISTORY:   Amy Bentley returns as scheduled. She feels well. No difficulty with bowel function. No complaint. No palpable breast mass. She is due for a colonoscopy next year.  Objective:  Vital signs in last 24 hours:  Blood pressure 129/76, pulse 95, temperature 98.3 F (36.8 C), temperature source Oral, resp. rate 18, height '5\' 1"'$  (1.549 m), weight (!) 334 lb 4.8 oz (151.6 kg), SpO2 99 %.    HEENT: Neck without mass Lymphatics: No cervical, supraclavicular, axillary, or inguinal nodes Resp: Lungs clear bilaterally Cardio: Regular rate and rhythm GI: No hepatosplenomegaly, no mass, reducible ventral hernia, nontender Vascular: No leg edema   Lab Results: 10/20/2016-CEA 1.55   Imaging:  Ct Chest W Contrast  Result Date: 10/21/2016 CLINICAL DATA:  Followup sigmoid colon carcinoma. Previous surgery and chemotherapy. EXAM: CT CHEST, ABDOMEN, AND PELVIS WITH CONTRAST TECHNIQUE: Multidetector CT imaging of the chest, abdomen and pelvis was performed following the standard protocol during bolus administration of intravenous contrast. CONTRAST:  100 mL ISOVUE-300 IOPAMIDOL (ISOVUE-300) INJECTION 61% COMPARISON:  10/11/2015 FINDINGS: CT CHEST FINDINGS Cardiovascular: No acute findings. Mediastinum/Lymph Nodes: No masses or pathologically enlarged lymph nodes identified. Lungs/Pleura: No pulmonary infiltrate or mass identified. No effusion present. Musculoskeletal: No suspicious bone lesions identified. Mass in the medial right breast measures 4.0 cm in maximum diameter on current study, compared to 3.6 cm in 2017 and 2.7 cm in 2016. CT ABDOMEN AND PELVIS FINDINGS Hepatobiliary: No masses identified. Focal fatty infiltration noted adjacent to the falciform ligament. Gallbladder is unremarkable. Pancreas:  No mass or inflammatory changes. Spleen:  Within normal limits in size and appearance. Adrenals/Urinary  tract:  No masses or hydronephrosis. Stomach/Bowel: No evidence of obstruction, inflammatory process, or abnormal fluid collections. Moderate periumbilical ventral hernias seen containing multiple small bowel loops. No evidence bowel wall thickening. Vascular/Lymphatic: No pathologically enlarged lymph nodes identified. No abdominal aortic aneurysm. Reproductive: 3 cm benign-appearing left ovarian cyst is seen, most likely physiologic in a reproductive age female . Uterus and adnexal regions are otherwise unremarkable. Other:  None. Musculoskeletal:  No suspicious bone lesions identified. IMPRESSION: No evidence of recurrent or metastatic colon carcinoma. 4 cm mass in medial right breast, which has increased in size compared to prior studies over past 2 years. Breast imaging is recommended for further evaluation . Stable moderate periumbilical hernia. Electronically Signed   By: Earle Gell M.D.   On: 10/21/2016 08:23   Ct Abdomen Pelvis W Contrast  Result Date: 10/21/2016 CLINICAL DATA:  Followup sigmoid colon carcinoma. Previous surgery and chemotherapy. EXAM: CT CHEST, ABDOMEN, AND PELVIS WITH CONTRAST TECHNIQUE: Multidetector CT imaging of the chest, abdomen and pelvis was performed following the standard protocol during bolus administration of intravenous contrast. CONTRAST:  100 mL ISOVUE-300 IOPAMIDOL (ISOVUE-300) INJECTION 61% COMPARISON:  10/11/2015 FINDINGS: CT CHEST FINDINGS Cardiovascular: No acute findings. Mediastinum/Lymph Nodes: No masses or pathologically enlarged lymph nodes identified. Lungs/Pleura: No pulmonary infiltrate or mass identified. No effusion present. Musculoskeletal: No suspicious bone lesions identified. Mass in the medial right breast measures 4.0 cm in maximum diameter on current study, compared to 3.6 cm in 2017 and 2.7 cm in 2016. CT ABDOMEN AND PELVIS FINDINGS Hepatobiliary: No masses identified. Focal fatty infiltration noted adjacent to the falciform ligament. Gallbladder  is unremarkable. Pancreas:  No mass or inflammatory changes. Spleen:  Within normal limits in size and appearance. Adrenals/Urinary tract:  No masses or hydronephrosis. Stomach/Bowel: No evidence of obstruction,  inflammatory process, or abnormal fluid collections. Moderate periumbilical ventral hernias seen containing multiple small bowel loops. No evidence bowel wall thickening. Vascular/Lymphatic: No pathologically enlarged lymph nodes identified. No abdominal aortic aneurysm. Reproductive: 3 cm benign-appearing left ovarian cyst is seen, most likely physiologic in a reproductive age female . Uterus and adnexal regions are otherwise unremarkable. Other:  None. Musculoskeletal:  No suspicious bone lesions identified. IMPRESSION: No evidence of recurrent or metastatic colon carcinoma. 4 cm mass in medial right breast, which has increased in size compared to prior studies over past 2 years. Breast imaging is recommended for further evaluation . Stable moderate periumbilical hernia. Electronically Signed   By: Earle Gell M.D.   On: 10/21/2016 08:23    Medications: I have reviewed the patient's current medications.  Assessment/Plan: 1. Stage IIC (T4b, N0), adenocarcinoma the sigmoid colon, status post a partial colectomy and en bloc bladder resection 10/20/2013, negative surgical margins, no preoperative colonoscopy. No loss of mismatch repair protein expression. Microsatellite stable.  Mildly elevated preoperative CEA.   CEA normal (1.0) on 11/09/2013.   Cycle 1 adjuvant FOLFOX 11/23/2013.   Cycle 2 adjuvant FOLFOX 12/07/2013.   Cycle 3 adjuvant FOLFOX 12/21/2013 (oxaliplatin dose reduced to 65 mg per meter squared due to neuropathy symptoms).   Cycle 4 adjuvant FOLFOX 01/04/2014.   Cycle 5 adjuvant FOLFOX 01/18/2014.   Cycle 6 adjuvant FOLFOX 02/01/2014-oxaliplatin discontinued prematurely secondary to an apparent hypersensitivity reaction.   Cycle 7 of adjuvant FOLFOX 02/15/2014.    Cycle 8 adjuvant FOLFOX 03/01/2014.   Cycle 9 adjuvant FOLFOX 03/15/2014. Oxaliplatin was held due to increased neuropathy symptoms  Cycle 10 FOLFOX 03/29/2014  Cycle 11 FOLFOX 04/12/2014-oxaliplatin discontinued  Cycle 12 FOLFOX 04/26/2014-oxaliplatin discontinued  Negative surveillance colonoscopy 08/16/2014  CTs of the chest, abdomen, and pelvis 10/01/2014-negative for metastatic disease   CTs of the chest, abdomen, and pelvis 10/11/2015-negative for recurrent disease  CTs of the chest, abdomen, and pelvis 10/21/2016-negative for recurrent disease 2.  history ofMicrocytic anemia-likely iron deficiency anemia. She continues oral iron. 3. Right breast nodule on the chest CT 11/14/2013.  Diagnostic mammogram and ultrasound 12/26/2013 showed a probable fibroadenoma within the inner right breast.   She discussed management options with the radiologist including excision, biopsy and short interval followup. She elected short interval followup. A followup ultrasound is recommended at 6, 12 and 24 months to assess stability.  11/08/2014 Needle core biopsy right breast 3:00 showed fibroadenoma. No atypia or malignancy identified.  CT 10/21/2016-medial right breast mass is larger 4. Port-A-Cath placement 11/15/2013. 5. Delayed nausea following cycle 1 FOLFOX. Aloxi and Emend added with cycle 2 with improvement. 6. 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 Oxaliplatin was dose reduced to 65 mg per meter squared beginning with cycle 3. Increased neuropathy symptoms with persistent numbness/tingling in both feet following cycle 8 FOLFOX. Oxaliplatin held with cycle 9. Neuropathy symptoms improved 03/29/2014. Oxaliplatin resumed with cycle 10. Oxaliplatin discontinued with cycles 11 and 12 FOLFOX secondary to persistent neuropathy symptoms. 7. Ocular pruritus. Most likely related to 5-fluorouracil. She did not  experience ocular pruritus following cycle 5 FOLFOX.  8. Hypersensitivity reaction to oxaliplatin with cycle 6 FOLFOX-she was premedicated with Decadron and given additional steroid/antihistamine premedication beginning with cycle 7 FOLFOX with no reaction.   Disposition:  Amy Bentley remains in clinical remission from colon cancer. She will return for an office visit and CEA in 8 months. The right breast mass is slightly larger on the  10/21/2016 CT. The mass was previously biopsied and this revealed a fibroadenoma. She will schedule an appointment with Dr. [name] after her next mammogram in late June.    Betsy Coder, MD  10/22/2016  3:36 PM

## 2016-10-26 ENCOUNTER — Other Ambulatory Visit: Payer: Self-pay | Admitting: Obstetrics and Gynecology

## 2016-10-26 DIAGNOSIS — Z1231 Encounter for screening mammogram for malignant neoplasm of breast: Secondary | ICD-10-CM

## 2016-11-24 ENCOUNTER — Telehealth: Payer: Self-pay | Admitting: *Deleted

## 2016-11-24 NOTE — Telephone Encounter (Signed)
Call placed to patient to confirm if she has an appt with Dr. [name] early July per order of Dr. Benay Spice.  Patient states that she is having a mammogram the end of June and will then call Dr. Marlowe Aschoff office to schedule appt.  Patient appreciative of call.

## 2016-12-25 ENCOUNTER — Ambulatory Visit
Admission: RE | Admit: 2016-12-25 | Discharge: 2016-12-25 | Disposition: A | Discharge status: Home / Self Care | Payer: BLUE CROSS/BLUE SHIELD | Source: Ambulatory Visit | Attending: Obstetrics and Gynecology | Admitting: Obstetrics and Gynecology

## 2016-12-25 DIAGNOSIS — Z1231 Encounter for screening mammogram for malignant neoplasm of breast: Secondary | ICD-10-CM | POA: Diagnosis not present

## 2017-06-01 ENCOUNTER — Telehealth: Payer: Self-pay | Admitting: Oncology

## 2017-06-01 NOTE — Telephone Encounter (Signed)
PAL MOVED FROM 12/27 TO 1/15 per patient

## 2017-06-24 ENCOUNTER — Ambulatory Visit: Payer: BLUE CROSS/BLUE SHIELD | Admitting: Oncology

## 2017-06-24 ENCOUNTER — Other Ambulatory Visit: Payer: BLUE CROSS/BLUE SHIELD

## 2017-07-02 ENCOUNTER — Ambulatory Visit: Payer: BLUE CROSS/BLUE SHIELD | Admitting: Oncology

## 2017-07-02 ENCOUNTER — Other Ambulatory Visit: Payer: BLUE CROSS/BLUE SHIELD

## 2017-07-05 ENCOUNTER — Encounter (HOSPITAL_COMMUNITY): Payer: Self-pay

## 2017-07-06 ENCOUNTER — Other Ambulatory Visit: Payer: Self-pay

## 2017-07-06 ENCOUNTER — Ambulatory Visit (HOSPITAL_COMMUNITY)
Admission: EM | Admit: 2017-07-06 | Discharge: 2017-07-06 | Disposition: A | Discharge status: Home / Self Care | Payer: BLUE CROSS/BLUE SHIELD | Attending: Internal Medicine | Admitting: Internal Medicine

## 2017-07-06 ENCOUNTER — Encounter (HOSPITAL_COMMUNITY): Payer: Self-pay | Admitting: Emergency Medicine

## 2017-07-06 DIAGNOSIS — R1084 Generalized abdominal pain: Secondary | ICD-10-CM | POA: Diagnosis not present

## 2017-07-06 DIAGNOSIS — E785 Hyperlipidemia, unspecified: Secondary | ICD-10-CM | POA: Insufficient documentation

## 2017-07-06 DIAGNOSIS — R112 Nausea with vomiting, unspecified: Secondary | ICD-10-CM | POA: Diagnosis not present

## 2017-07-06 DIAGNOSIS — Z87891 Personal history of nicotine dependence: Secondary | ICD-10-CM | POA: Insufficient documentation

## 2017-07-06 DIAGNOSIS — E43 Unspecified severe protein-calorie malnutrition: Secondary | ICD-10-CM | POA: Insufficient documentation

## 2017-07-06 DIAGNOSIS — N390 Urinary tract infection, site not specified: Secondary | ICD-10-CM | POA: Diagnosis not present

## 2017-07-06 DIAGNOSIS — M549 Dorsalgia, unspecified: Secondary | ICD-10-CM | POA: Insufficient documentation

## 2017-07-06 DIAGNOSIS — N189 Chronic kidney disease, unspecified: Secondary | ICD-10-CM | POA: Insufficient documentation

## 2017-07-06 DIAGNOSIS — R4182 Altered mental status, unspecified: Secondary | ICD-10-CM | POA: Insufficient documentation

## 2017-07-06 DIAGNOSIS — D509 Iron deficiency anemia, unspecified: Secondary | ICD-10-CM | POA: Diagnosis not present

## 2017-07-06 DIAGNOSIS — E876 Hypokalemia: Secondary | ICD-10-CM | POA: Insufficient documentation

## 2017-07-06 DIAGNOSIS — R197 Diarrhea, unspecified: Secondary | ICD-10-CM | POA: Insufficient documentation

## 2017-07-06 DIAGNOSIS — C187 Malignant neoplasm of sigmoid colon: Secondary | ICD-10-CM | POA: Insufficient documentation

## 2017-07-06 DIAGNOSIS — K219 Gastro-esophageal reflux disease without esophagitis: Secondary | ICD-10-CM | POA: Insufficient documentation

## 2017-07-06 DIAGNOSIS — M5489 Other dorsalgia: Secondary | ICD-10-CM | POA: Diagnosis not present

## 2017-07-06 LAB — POCT URINALYSIS DIP (DEVICE)
Glucose, UA: NEGATIVE mg/dL
Ketones, ur: 40 mg/dL — AB
Leukocytes, UA: NEGATIVE
Nitrite: POSITIVE — AB
Protein, ur: 100 mg/dL — AB
Specific Gravity, Urine: >=1.03 (ref 1.005–1.030)
Urobilinogen, UA: 0.2 mg/dL (ref 0.0–1.0)
pH: 5.5 (ref 5.0–8.0)

## 2017-07-06 LAB — POCT I-STAT, CHEM 8
BUN: 16 mg/dL (ref 6–20)
Calcium, Ion: 1.14 mmol/L — ABNORMAL LOW (ref 1.15–1.40)
Chloride: 99 mmol/L — ABNORMAL LOW (ref 101–111)
Creatinine, Ser: 1 mg/dL (ref 0.44–1.00)
Glucose, Bld: 150 mg/dL — ABNORMAL HIGH (ref 65–99)
HCT: 54 % — ABNORMAL HIGH (ref 36.0–46.0)
Hemoglobin: 18.4 g/dL — ABNORMAL HIGH (ref 12.0–15.0)
Potassium: 3.4 mmol/L — ABNORMAL LOW (ref 3.5–5.1)
Sodium: 140 mmol/L (ref 135–145)
TCO2: 27 mmol/L (ref 22–32)

## 2017-07-06 LAB — POCT PREGNANCY, URINE: Preg Test, Ur: NEGATIVE

## 2017-07-06 MED ORDER — KETOROLAC TROMETHAMINE 60 MG/2ML IM SOLN
INTRAMUSCULAR | Status: AC
  Filled 2017-07-06: qty 2

## 2017-07-06 MED ORDER — KETOROLAC TROMETHAMINE 60 MG/2ML IM SOLN
60.0000 mg | Freq: Once | INTRAMUSCULAR | Status: AC
  Administered 2017-07-06: 60 mg via INTRAMUSCULAR

## 2017-07-06 MED ORDER — CEPHALEXIN 500 MG PO CAPS: 500.0000 mg | ORAL_CAPSULE | Freq: Two times a day (BID) | ORAL | 0 refills | Status: DC

## 2017-07-06 MED ORDER — ONDANSETRON 4 MG PO TBDP
ORAL_TABLET | ORAL | Status: AC
  Filled 2017-07-06: qty 1

## 2017-07-06 MED ORDER — ONDANSETRON 4 MG PO TBDP
4.0000 mg | ORAL_TABLET | Freq: Once | ORAL | Status: AC
  Administered 2017-07-06: 4 mg via ORAL

## 2017-07-06 MED ORDER — ONDANSETRON HCL 4 MG PO TABS: 4.0000 mg | ORAL_TABLET | Freq: Three times a day (TID) | ORAL | 0 refills | Status: DC | PRN

## 2017-07-06 NOTE — Discharge Instructions (Signed)
Please increase your fluid intake as it appears you are dehydrated. Push water, avoid alcohol or caffeine.  Nausea medication every 8 hours as needed.  Initiate antibiotics, I have a culture in process of your urine, will notify you if any changes to medications need to be made. If you develop fevers, increased pain, vomiting, or diarrhea, blood to vomit or diarrhea or otherwise worsening please go to the ER for further evaluation.

## 2017-07-06 NOTE — ED Triage Notes (Signed)
Pt c/o n/v/d x3 days, stomach cramping generalized, back cramping.

## 2017-07-06 NOTE — ED Provider Notes (Signed)
[place]    CSN: [id number] Arrival date & time: 07/06/17  1414     History   Chief Complaint No chief complaint on file.   HPI Amy Bentley is a 47 y.o. female.   Kaelyn presents with complaints of three days of illness which started as nausea and developed into vomiting, diarrhea and now with abdominal and back pain. She has vomited 6 times today, once today, and has had 5 episodes of diarrhea, none today. Rates abdominal and back pain 10/10, it does come and go. Feels like "spasms." without fevers. Denies urinary symptoms. No known ill contacts. Has not taken any medications for symptoms. History of UTI's but has not had one in a few years. History of hernia but this has been unchanged. History of GERD.    ROS per HPI.       Past Medical History:  Diagnosis Date  . Anemia   . Chronic kidney disease    hx UTIs  . colon ca dx'd 09/2013  . GERD (gastroesophageal reflux disease)   . Hyperlipidemia     Patient Active Problem List   Diagnosis Date Noted  . Malignant neoplasm of sigmoid colon ([place]) 03/13/2014  . Family history of malignant neoplasm of gastrointestinal tract 03/13/2014  . Cancer of sigmoid colon, invading bladder, T4N0, s/p en bloc resection with Dr. Louis Meckel 4/24 11/06/2013  . Protein-calorie malnutrition, severe ([place]) 10/19/2013  . Iron deficiency anemia, multifactorial with GI/GU and menstrual losses contributory 10/18/2013  . Diverticulitis of colon (without mention of hemorrhage)(562.11) 10/18/2013  . Weight loss 10/17/2013  . Altered mental status 08/07/2013  . UTI (lower urinary tract infection) 08/07/2013  . Hypokalemia 08/07/2013  . Anemia 08/07/2013    Past Surgical History:  Procedure Laterality Date  . BREAST BIOPSY Right    stereo   . CESAREAN SECTION     x 2  . COLON RESECTION N/A 10/20/2013   Procedure: OPEN SIGMOID COLONECTOMY COLOVESICAL FISTULA REPAIR;  Surgeon: Stark Klein, MD;  Location: [facility];  Service:  General;  Laterality: N/A;  . COLONOSCOPY WITH PROPOFOL N/A 08/16/2014   Procedure: COLONOSCOPY WITH PROPOFOL;  Surgeon: Milus Banister, MD;  Location: [facility];  Service: Endoscopy;  Laterality: N/A;  . CYSTECTOMY N/A 10/20/2013   Procedure: CYSTECTOMY PARTIAL;  Surgeon: Ardis Hughs, MD;  Location: [facility];  Service: Urology;  Laterality: N/A;  . PORTACATH PLACEMENT N/A 11/15/2013   Procedure: INSERTION PORT-A-CATH;  Surgeon: Stark Klein, MD;  Location: [facility];  Service: General;  Laterality: N/A;  . TUBAL LIGATION      OB History    Gravida Para Term Preterm AB Living   2 2 2     2    SAB TAB Ectopic Multiple Live Births                   Home Medications    Prior to Admission medications   Medication Sig Start Date End Date Taking? Authorizing Provider  cephALEXin (KEFLEX) 500 MG capsule Take 1 capsule (500 mg total) by mouth 2 (two) times daily for 7 days. 07/06/17 07/13/17  Zigmund Gottron, NP  Cranberry-Vitamin C-Probiotic (AZO CRANBERRY PO) Take 1 capsule by mouth daily.     [provider]  ferrous fumarate (HEMOCYTE - 106 MG FE) 325 (106 FE) MG TABS tablet Take 1 tablet by mouth.    [provider]  ibuprofen (ADVIL,MOTRIN) 200 MG tablet Take 600 mg by mouth every 6 (six) hours as needed  for mild pain.    [provider]  omeprazole (PRILOSEC OTC) 20 MG tablet Take 20 mg by mouth daily.    [provider]  ondansetron (ZOFRAN) 4 MG tablet Take 1 tablet (4 mg total) by mouth every 8 (eight) hours as needed for nausea or vomiting. 07/06/17   Zigmund Gottron, NP    Family History Family History  Problem Relation Age of Onset  . Cancer Paternal Aunt 58       colon  . Colon cancer Paternal Aunt   . Esophageal cancer Neg Hx   . Rectal cancer Neg Hx   . Stomach cancer Neg Hx   . Breast cancer Neg Hx     Social History Social History   Tobacco Use  . Smoking status: Former Smoker    Packs/day: 0.50    Types: Cigarettes     Last attempt to quit: 10/17/2013    Years since quitting: 3.7  . Smokeless tobacco: Never Used  Substance Use Topics  . Alcohol use: No  . Drug use: No     Allergies   Bactrim [sulfamethoxazole-trimethoprim]   Review of Systems Review of Systems   Physical Exam Triage Vital Signs ED Triage Vitals  Enc Vitals Group     BP 07/06/17 1440 (!) 140/118     Pulse Rate 07/06/17 1440 100     Resp 07/06/17 1440 18     Temp 07/06/17 1440 97.8 F (36.6 C)     Temp src --      SpO2 07/06/17 1440 100 %     Weight --      Height --      Head Circumference --      Peak Flow --      Pain Score 07/06/17 1442 10     Pain Loc --      Pain Edu? --      Excl. in [place]? --    No data found.  Updated Vital Signs BP (!) 140/118   Pulse 100   Temp 97.8 F (36.6 C)   Resp 18   LMP 06/09/2017   SpO2 100%   Visual Acuity Right Eye Distance:   Left Eye Distance:   Bilateral Distance:    Right Eye Near:   Left Eye Near:    Bilateral Near:     Physical Exam  Constitutional: She is oriented to person, place, and time. She appears well-developed and well-nourished. No distress.  Cardiovascular: Normal rate, regular rhythm and normal heart sounds.  Pulmonary/Chest: Effort normal and breath sounds normal.  Abdominal: Soft. There is generalized tenderness. There is no rigidity, no rebound, no guarding, no CVA tenderness, no tenderness at McBurney's point and negative Murphy's sign. A hernia is present.  Large soft umbilical hernia, patient states this is unchanged; generalized abdominal pain  Musculoskeletal:       Arms: Mid low back tenderness and "spasm" associated with abdominal pain   Neurological: She is alert and oriented to person, place, and time.  Skin: Skin is warm and dry.     UC Treatments / Results  Labs (all labs ordered are listed, but only abnormal results are displayed) Labs Reviewed  POCT URINALYSIS DIP (DEVICE) - Abnormal; Notable for the following components:       Result Value   Bilirubin Urine SMALL (*)    Ketones, ur 40 (*)    Hgb urine dipstick LARGE (*)    Protein, ur 100 (*)    Nitrite POSITIVE (*)  All other components within normal limits  POCT I-STAT, CHEM 8 - Abnormal; Notable for the following components:   Potassium 3.4 (*)    Chloride 99 (*)    Glucose, Bld 150 (*)    Calcium, Ion 1.14 (*)    Hemoglobin 18.4 (*)    HCT 54.0 (*)    All other components within normal limits  URINE CULTURE  POCT PREGNANCY, URINE    EKG  EKG Interpretation None       Radiology No results found.  Procedures Procedures (including critical care time)  Medications Ordered in UC Medications  ondansetron (ZOFRAN-ODT) disintegrating tablet 4 mg (4 mg Oral Given 07/06/17 1604)  ketorolac (TORADOL) injection 60 mg (60 mg Intramuscular Given 07/06/17 1612)     Initial Impression / Assessment and Plan / UC Course  I have reviewed the triage vital signs and the nursing notes.  Pertinent labs & imaging results that were available during my care of the patient were reviewed by me and considered in my medical decision making (see chart for details).  Clinical Course as of Jul 06 1626  Tue Jul 06, 2017  1626 Patient states pain has significantly improved already  [NB]    Clinical Course User Index [NB] Zigmund Gottron, NP    Non specific exam at this time. Vomiting and diarrhea have been decreasing in frequency. Patient without vomiting or diarrhea throughout visit today. Pain significantly improved with toradol. Vitals stable, without fever or tachycardia. Apparent hypovolemia per labs, patient declines ivf in clinic today, states she feels she can increase fluid intake. Discussed UTI vs viral gastroenteritis. Nitrite in urine today, have sent for culture. Return precautions provided. Patient verbalized understanding and agreeable to plan.    Final Clinical Impressions(s) / UC Diagnoses   Final diagnoses:  Generalized abdominal pain  Urinary  tract infection without hematuria, site unspecified    ED Discharge Orders        Ordered    ondansetron (ZOFRAN) 4 MG tablet  Every 8 hours PRN     07/06/17 1628    cephALEXin (KEFLEX) 500 MG capsule  2 times daily     07/06/17 1628       Controlled Substance Prescriptions [place] Controlled Substance Registry consulted? Not Applicable   Zigmund Gottron, NP 07/06/17 [phone]

## 2017-07-09 LAB — URINE CULTURE: Culture: >=100000 — AB

## 2017-07-12 ENCOUNTER — Other Ambulatory Visit: Payer: Self-pay

## 2017-07-12 ENCOUNTER — Inpatient Hospital Stay (HOSPITAL_COMMUNITY)
Admission: EM | Admit: 2017-07-12 | Discharge: 2017-07-26 | DRG: 335 | Disposition: A | Discharge status: Home / Self Care | Payer: BLUE CROSS/BLUE SHIELD | Attending: General Surgery | Admitting: General Surgery

## 2017-07-12 ENCOUNTER — Encounter (HOSPITAL_COMMUNITY): Payer: Self-pay | Admitting: Emergency Medicine

## 2017-07-12 ENCOUNTER — Emergency Department (HOSPITAL_COMMUNITY): Payer: BLUE CROSS/BLUE SHIELD

## 2017-07-12 DIAGNOSIS — Z87891 Personal history of nicotine dependence: Secondary | ICD-10-CM | POA: Diagnosis not present

## 2017-07-12 DIAGNOSIS — Z9221 Personal history of antineoplastic chemotherapy: Secondary | ICD-10-CM | POA: Diagnosis not present

## 2017-07-12 DIAGNOSIS — E876 Hypokalemia: Secondary | ICD-10-CM | POA: Diagnosis not present

## 2017-07-12 DIAGNOSIS — N320 Bladder-neck obstruction: Secondary | ICD-10-CM | POA: Diagnosis not present

## 2017-07-12 DIAGNOSIS — N179 Acute kidney failure, unspecified: Secondary | ICD-10-CM | POA: Diagnosis not present

## 2017-07-12 DIAGNOSIS — K56609 Unspecified intestinal obstruction, unspecified as to partial versus complete obstruction: Secondary | ICD-10-CM | POA: Diagnosis not present

## 2017-07-12 DIAGNOSIS — C187 Malignant neoplasm of sigmoid colon: Secondary | ICD-10-CM | POA: Diagnosis present

## 2017-07-12 DIAGNOSIS — Z79899 Other long term (current) drug therapy: Secondary | ICD-10-CM | POA: Diagnosis not present

## 2017-07-12 DIAGNOSIS — K430 Incisional hernia with obstruction, without gangrene: Secondary | ICD-10-CM | POA: Diagnosis not present

## 2017-07-12 DIAGNOSIS — K439 Ventral hernia without obstruction or gangrene: Secondary | ICD-10-CM

## 2017-07-12 DIAGNOSIS — K219 Gastro-esophageal reflux disease without esophagitis: Secondary | ICD-10-CM | POA: Diagnosis present

## 2017-07-12 DIAGNOSIS — R000 Tachycardia, unspecified: Secondary | ICD-10-CM | POA: Diagnosis not present

## 2017-07-12 DIAGNOSIS — M546 Pain in thoracic spine: Secondary | ICD-10-CM | POA: Diagnosis not present

## 2017-07-12 DIAGNOSIS — N390 Urinary tract infection, site not specified: Secondary | ICD-10-CM | POA: Diagnosis not present

## 2017-07-12 DIAGNOSIS — R338 Other retention of urine: Secondary | ICD-10-CM | POA: Diagnosis not present

## 2017-07-12 DIAGNOSIS — R34 Anuria and oliguria: Secondary | ICD-10-CM

## 2017-07-12 DIAGNOSIS — Z888 Allergy status to other drugs, medicaments and biological substances status: Secondary | ICD-10-CM

## 2017-07-12 DIAGNOSIS — N189 Chronic kidney disease, unspecified: Secondary | ICD-10-CM | POA: Diagnosis present

## 2017-07-12 DIAGNOSIS — Z0189 Encounter for other specified special examinations: Secondary | ICD-10-CM

## 2017-07-12 DIAGNOSIS — E785 Hyperlipidemia, unspecified: Secondary | ICD-10-CM | POA: Diagnosis present

## 2017-07-12 DIAGNOSIS — R944 Abnormal results of kidney function studies: Secondary | ICD-10-CM | POA: Diagnosis not present

## 2017-07-12 DIAGNOSIS — K565 Intestinal adhesions [bands], unspecified as to partial versus complete obstruction: Secondary | ICD-10-CM | POA: Diagnosis not present

## 2017-07-12 DIAGNOSIS — Z87440 Personal history of urinary (tract) infections: Secondary | ICD-10-CM

## 2017-07-12 DIAGNOSIS — Z85038 Personal history of other malignant neoplasm of large intestine: Secondary | ICD-10-CM | POA: Diagnosis not present

## 2017-07-12 DIAGNOSIS — R112 Nausea with vomiting, unspecified: Secondary | ICD-10-CM

## 2017-07-12 DIAGNOSIS — B9620 Unspecified Escherichia coli [E. coli] as the cause of diseases classified elsewhere: Secondary | ICD-10-CM | POA: Diagnosis present

## 2017-07-12 DIAGNOSIS — E860 Dehydration: Secondary | ICD-10-CM | POA: Diagnosis present

## 2017-07-12 DIAGNOSIS — K469 Unspecified abdominal hernia without obstruction or gangrene: Secondary | ICD-10-CM

## 2017-07-12 DIAGNOSIS — K436 Other and unspecified ventral hernia with obstruction, without gangrene: Secondary | ICD-10-CM

## 2017-07-12 DIAGNOSIS — E43 Unspecified severe protein-calorie malnutrition: Secondary | ICD-10-CM | POA: Diagnosis not present

## 2017-07-12 DIAGNOSIS — R1110 Vomiting, unspecified: Secondary | ICD-10-CM | POA: Diagnosis not present

## 2017-07-12 DIAGNOSIS — K432 Incisional hernia without obstruction or gangrene: Secondary | ICD-10-CM

## 2017-07-12 DIAGNOSIS — R252 Cramp and spasm: Secondary | ICD-10-CM | POA: Diagnosis not present

## 2017-07-12 DIAGNOSIS — Z6841 Body Mass Index (BMI) 40.0 and over, adult: Secondary | ICD-10-CM | POA: Diagnosis not present

## 2017-07-12 DIAGNOSIS — Z9889 Other specified postprocedural states: Secondary | ICD-10-CM

## 2017-07-12 DIAGNOSIS — K56600 Partial intestinal obstruction, unspecified as to cause: Secondary | ICD-10-CM | POA: Diagnosis not present

## 2017-07-12 DIAGNOSIS — K567 Ileus, unspecified: Secondary | ICD-10-CM

## 2017-07-12 LAB — URINALYSIS, ROUTINE W REFLEX MICROSCOPIC
Bilirubin Urine: NEGATIVE
Glucose, UA: NEGATIVE mg/dL
Ketones, ur: 20 mg/dL — AB
Leukocytes, UA: NEGATIVE
Nitrite: NEGATIVE
Protein, ur: 30 mg/dL — AB
Specific Gravity, Urine: 1.02 (ref 1.005–1.030)
pH: 5 (ref 5.0–8.0)

## 2017-07-12 LAB — COMPREHENSIVE METABOLIC PANEL
ALT: 26 U/L (ref 14–54)
AST: 23 U/L (ref 15–41)
Albumin: 4.5 g/dL (ref 3.5–5.0)
Alkaline Phosphatase: 96 U/L (ref 38–126)
Anion gap: 13 (ref 5–15)
BUN: 19 mg/dL (ref 6–20)
CO2: 33 mmol/L — ABNORMAL HIGH (ref 22–32)
Calcium: 9.5 mg/dL (ref 8.9–10.3)
Chloride: 90 mmol/L — ABNORMAL LOW (ref 101–111)
Creatinine, Ser: 1.26 mg/dL — ABNORMAL HIGH (ref 0.44–1.00)
GFR calc Af Amer: 59 mL/min — ABNORMAL LOW (ref >60)
GFR calc non Af Amer: 51 mL/min — ABNORMAL LOW (ref >60)
Glucose, Bld: 135 mg/dL — ABNORMAL HIGH (ref 65–99)
Potassium: 2.9 mmol/L — ABNORMAL LOW (ref 3.5–5.1)
Sodium: 136 mmol/L (ref 135–145)
Total Bilirubin: 1.1 mg/dL (ref 0.3–1.2)
Total Protein: 8.4 g/dL — ABNORMAL HIGH (ref 6.5–8.1)

## 2017-07-12 LAB — CBC WITH DIFFERENTIAL/PLATELET
Basophils Absolute: 0 10*3/uL (ref 0.0–0.1)
Basophils Relative: 0 %
Eosinophils Absolute: 0 10*3/uL (ref 0.0–0.7)
Eosinophils Relative: 0 %
HCT: 48.9 % — ABNORMAL HIGH (ref 36.0–46.0)
Hemoglobin: 16.2 g/dL — ABNORMAL HIGH (ref 12.0–15.0)
Lymphocytes Relative: 11 %
Lymphs Abs: 1.2 10*3/uL (ref 0.7–4.0)
MCH: 27.6 pg (ref 26.0–34.0)
MCHC: 33.1 g/dL (ref 30.0–36.0)
MCV: 83.2 fL (ref 78.0–100.0)
Monocytes Absolute: 0.5 10*3/uL (ref 0.1–1.0)
Monocytes Relative: 5 %
Neutro Abs: 8.5 10*3/uL — ABNORMAL HIGH (ref 1.7–7.7)
Neutrophils Relative %: 84 %
Platelets: 308 10*3/uL (ref 150–400)
RBC: 5.88 MIL/uL — ABNORMAL HIGH (ref 3.87–5.11)
RDW: 15.1 % (ref 11.5–15.5)
WBC: 10.3 10*3/uL (ref 4.0–10.5)

## 2017-07-12 LAB — LIPASE, BLOOD: Lipase: 42 U/L (ref 11–51)

## 2017-07-12 LAB — MAGNESIUM: Magnesium: 2.6 mg/dL — ABNORMAL HIGH (ref 1.7–2.4)

## 2017-07-12 MED ORDER — DIPHENHYDRAMINE HCL 50 MG/ML IJ SOLN
25.0000 mg | Freq: Four times a day (QID) | INTRAMUSCULAR | Status: DC | PRN
  Administered 2017-07-19 – 2017-07-21 (×5): 25 mg via INTRAVENOUS
  Filled 2017-07-12 (×5): qty 1

## 2017-07-12 MED ORDER — MORPHINE SULFATE (PF) 4 MG/ML IV SOLN
4.0000 mg | Freq: Once | INTRAVENOUS | Status: AC
Start: 2017-07-12 — End: 2017-07-12
  Administered 2017-07-12: 4 mg via INTRAVENOUS
  Filled 2017-07-12: qty 1

## 2017-07-12 MED ORDER — SODIUM CHLORIDE 0.9 % IV BOLUS (SEPSIS)
1000.0000 mL | Freq: Once | INTRAVENOUS | Status: AC
  Administered 2017-07-12: 1000 mL via INTRAVENOUS

## 2017-07-12 MED ORDER — ENOXAPARIN SODIUM 40 MG/0.4ML ~~LOC~~ SOLN: 40.0000 mg | SUBCUTANEOUS | Status: DC

## 2017-07-12 MED ORDER — PROCHLORPERAZINE EDISYLATE 5 MG/ML IJ SOLN
5.0000 mg | Freq: Four times a day (QID) | INTRAMUSCULAR | Status: DC | PRN
  Administered 2017-07-17 – 2017-07-18 (×4): 5 mg via INTRAVENOUS
  Filled 2017-07-12 (×4): qty 2

## 2017-07-12 MED ORDER — MORPHINE SULFATE (PF) 2 MG/ML IV SOLN
2.0000 mg | INTRAVENOUS | Status: DC | PRN
  Administered 2017-07-12 – 2017-07-18 (×7): 2 mg via INTRAVENOUS
  Filled 2017-07-12 (×6): qty 1

## 2017-07-12 MED ORDER — MORPHINE SULFATE (PF) 4 MG/ML IV SOLN
4.0000 mg | Freq: Once | INTRAVENOUS | Status: AC
  Administered 2017-07-12: 4 mg via INTRAVENOUS
  Filled 2017-07-12: qty 1

## 2017-07-12 MED ORDER — KCL-LACTATED RINGERS-D5W 20 MEQ/L IV SOLN
INTRAVENOUS | Status: DC
  Administered 2017-07-12: 16:00:00 via INTRAVENOUS
  Filled 2017-07-12 (×2): qty 1000

## 2017-07-12 MED ORDER — DEXTROSE 5 % IV SOLN
1.0000 g | INTRAVENOUS | Status: DC
  Administered 2017-07-12 – 2017-07-13 (×2): 1 g via INTRAVENOUS
  Filled 2017-07-12 (×2): qty 10

## 2017-07-12 MED ORDER — POTASSIUM CHLORIDE 10 MEQ/100ML IV SOLN
10.0000 meq | INTRAVENOUS | Status: AC
  Administered 2017-07-12 (×2): 10 meq via INTRAVENOUS
  Filled 2017-07-12 (×2): qty 100

## 2017-07-12 MED ORDER — PROCHLORPERAZINE MALEATE 10 MG PO TABS
10.0000 mg | ORAL_TABLET | Freq: Four times a day (QID) | ORAL | Status: DC | PRN
  Filled 2017-07-12: qty 1

## 2017-07-12 MED ORDER — METHOCARBAMOL 1000 MG/10ML IJ SOLN
500.0000 mg | Freq: Three times a day (TID) | INTRAVENOUS | Status: DC
  Administered 2017-07-12 – 2017-07-16 (×12): 500 mg via INTRAVENOUS
  Filled 2017-07-12: qty 550
  Filled 2017-07-12: qty 5
  Filled 2017-07-12 (×6): qty 550
  Filled 2017-07-12: qty 5
  Filled 2017-07-12: qty 550
  Filled 2017-07-12: qty 5
  Filled 2017-07-12 (×3): qty 550

## 2017-07-12 MED ORDER — PROMETHAZINE HCL 25 MG/ML IJ SOLN
25.0000 mg | Freq: Once | INTRAMUSCULAR | Status: AC
  Administered 2017-07-12: 25 mg via INTRAVENOUS
  Filled 2017-07-12: qty 1

## 2017-07-12 MED ORDER — IOPAMIDOL (ISOVUE-300) INJECTION 61%
100.0000 mL | Freq: Once | INTRAVENOUS | Status: AC | PRN
  Administered 2017-07-12: 100 mL via INTRAVENOUS

## 2017-07-12 MED ORDER — IOPAMIDOL (ISOVUE-300) INJECTION 61%
INTRAVENOUS | Status: AC
  Filled 2017-07-12: qty 100

## 2017-07-12 MED ORDER — ENOXAPARIN SODIUM 60 MG/0.6ML ~~LOC~~ SOLN
60.0000 mg | SUBCUTANEOUS | Status: DC
  Administered 2017-07-12: 60 mg via SUBCUTANEOUS
  Filled 2017-07-12: qty 0.6

## 2017-07-12 MED ORDER — DIATRIZOATE MEGLUMINE & SODIUM 66-10 % PO SOLN
90.0000 mL | Freq: Once | ORAL | Status: DC | PRN
  Filled 2017-07-12: qty 90

## 2017-07-12 MED ORDER — ONDANSETRON HCL 4 MG/2ML IJ SOLN
4.0000 mg | Freq: Four times a day (QID) | INTRAMUSCULAR | Status: DC | PRN
  Administered 2017-07-13 – 2017-07-18 (×9): 4 mg via INTRAVENOUS
  Filled 2017-07-12 (×10): qty 2

## 2017-07-12 MED ORDER — ONDANSETRON 4 MG PO TBDP
4.0000 mg | ORAL_TABLET | Freq: Four times a day (QID) | ORAL | Status: DC | PRN
  Administered 2017-07-20: 4 mg via ORAL
  Filled 2017-07-12 (×2): qty 1

## 2017-07-12 MED ORDER — DIPHENHYDRAMINE HCL 25 MG PO CAPS: 25.0000 mg | ORAL_CAPSULE | Freq: Four times a day (QID) | ORAL | Status: DC | PRN

## 2017-07-12 MED ORDER — SODIUM CHLORIDE 0.9 % IV BOLUS (SEPSIS)
500.0000 mL | Freq: Once | INTRAVENOUS | Status: AC
  Administered 2017-07-12: 500 mL via INTRAVENOUS

## 2017-07-12 NOTE — Progress Notes (Signed)
Patient approved for mother to be in the room during admission work questioning

## 2017-07-12 NOTE — Progress Notes (Signed)
RN attempted to insert NG tube unsuccessfully in both nares.  2 subsequent attempts were made by 2 other RN's into each nare with no success.  Resistance met each time.  MD notified and advised RN to leave NG out at this time.  Plan for rounding MD in am to re-evaluate.

## 2017-07-12 NOTE — H&P (Signed)
Reason for Consult:  Ventral hernia Referring Physician: Alferd Apa   Amy Bentley is an 47 y.o. female.  HPI: Patient is a 47 year old female who is status post open sigmoid colectomy with repair of colovesicular fistula 10/20/2013 by Dr. Stark Klein.  She presents with nausea vomiting and reports she has been unable to keep anything down.  She was seen on 07/06/17, at the [facility] urgent care center.  She was treated for a urinary tract infection that grew out greater than 100,000 E. coli.  She was placed on Keflex at discharge.  She returned to the emergency department early this a.m. with ongoing nausea and pain.  She has been I am able to keep her medications down. She feels dehydrated. She also complained of back pain that started this AM.   Workup in the ED: She is afebrile, vital signs are stable.  Potassium is 2.9, creatinine is up to 1.26 from 1.0 on 07/06/17.  LFTs and lipase are normal.  WBC is 10.3, hemoglobin 16, hematocrit 48, platelets are 308,000.  Glucoses are mildly elevated 150 on 07/06/17 and 135 this a.m.  Repeat urinalysis today shows the nitrates are negative.  0-5 white cells per high-powered field.  CT scan completed at 9:48 AM today: Bibasilar atelectasis.  There is a ventral hernia which contains small bowel.  There is dilatation of the small bowel to the level of the hernia a transition zone occurs within the ventral hernia along the rightward aspect consistent with a bowel obstruction.  There is no free air or portal venous air.  No pneumatosis evidence of prior sigmoid region anastomosis.  Impression was a ventral hernia containing small bowel proximal to the hernia there is a focal transition zone indicating small bowel obstruction.  Hernia measures 6.9 cm from the right to left and 6.2 cm superior to inferior. Ventral hernia has been present since 09/2014.   We are asked to see.       Past Medical History:  Diagnosis Date  . Stage IIC (T4b, N0), adenocarcinoma the  sigmoid colon, status post a partial colectomy and en bloc bladder resection 10/20/2013, negative surgical margins, no preoperative colonoscopy Completed chemotherapy - in remission Early oxaliplatin neuropathy Anemia    . Chronic kidney disease      hx UTIs  . colon ca dx'd 09/2013  . GERD (gastroesophageal reflux disease)    . Hyperlipidemia             Past Surgical History:  Procedure Laterality Date  . BREAST BIOPSY Right      stereo   . CESAREAN SECTION        x 2  . COLON RESECTION N/A 10/20/2013    Procedure: OPEN SIGMOID COLONECTOMY COLOVESICAL FISTULA REPAIR;  Surgeon: Stark Klein, MD;  Location: [facility];  Service: General;  Laterality: N/A;  . COLONOSCOPY WITH PROPOFOL N/A 08/16/2014    Procedure: COLONOSCOPY WITH PROPOFOL;  Surgeon: Milus Banister, MD;  Location: [facility];  Service: Endoscopy;  Laterality: N/A;  . CYSTECTOMY N/A 10/20/2013    Procedure: CYSTECTOMY PARTIAL;  Surgeon: Ardis Hughs, MD;  Location: [facility];  Service: Urology;  Laterality: N/A;  . PORTACATH PLACEMENT N/A 11/15/2013    Procedure: INSERTION PORT-A-CATH;  Surgeon: Stark Klein, MD;  Location: [facility];  Service: General;  Laterality: N/A;  . TUBAL LIGATION               Family History  Problem Relation Age of Onset  . Cancer  Paternal Aunt 18        colon  . Colon cancer Paternal Aunt    . Hypertension Other    . Diabetes Other    . Esophageal cancer Neg Hx    . Rectal cancer Neg Hx    . Stomach cancer Neg Hx    . Breast cancer Neg Hx        Social History:  reports that she quit smoking about 3 years ago. Her smoking use included cigarettes. She smoked 0.50 packs per day. she has never used smokeless tobacco. She reports that she does not drink alcohol or use drugs. 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 Allergies:       Allergies  Allergen Reactions  . Bactrim  [Sulfamethoxazole-Trimethoprim]        Causes disorientation, inability to speak           Prior to Admission medications   Medication Sig Start Date End Date Taking? Authorizing Provider  cephALEXin (KEFLEX) 500 MG capsule Take 1 capsule (500 mg total) by mouth 2 (two) times daily for 7 days. 07/06/17 07/13/17 Yes Burky, Malachy Moan, NP  ibuprofen (ADVIL,MOTRIN) 200 MG tablet Take 600 mg by mouth every 6 (six) hours as needed for mild pain.     Yes [provider]  ondansetron (ZOFRAN) 4 MG tablet Take 1 tablet (4 mg total) by mouth every 8 (eight) hours as needed for nausea or vomiting. 07/06/17   Yes Zigmund Gottron, NP        Lab Results Last 48 Hours        Results for orders placed or performed during the hospital encounter of 07/12/17 (from the past 48 hour(s))  Comprehensive metabolic panel     Status: Abnormal    Collection Time: 07/12/17  5:49 AM  Result Value Ref Range    Sodium 136 135 - 145 mmol/L    Potassium 2.9 (L) 3.5 - 5.1 mmol/L    Chloride 90 (L) 101 - 111 mmol/L    CO2 33 (H) 22 - 32 mmol/L    Glucose, Bld 135 (H) 65 - 99 mg/dL    BUN 19 6 - 20 mg/dL    Creatinine, Ser 1.26 (H) 0.44 - 1.00 mg/dL    Calcium 9.5 8.9 - 10.3 mg/dL    Total Protein 8.4 (H) 6.5 - 8.1 g/dL    Albumin 4.5 3.5 - 5.0 g/dL    AST 23 15 - 41 U/L    ALT 26 14 - 54 U/L    Alkaline Phosphatase 96 38 - 126 U/L    Total Bilirubin 1.1 0.3 - 1.2 mg/dL    GFR calc non Af Amer 51 (L) >60 mL/min    GFR calc Af Amer 59 (L) >60 mL/min      Comment: (NOTE) The eGFR has been calculated using the CKD EPI equation. This calculation has not been validated in all clinical situations. eGFR's persistently <60 mL/min signify possible Chronic Kidney Disease.      Anion gap 13 5 - 15  Lipase, blood     Status: None    Collection Time: 07/12/17  5:49 AM  Result Value Ref Range    Lipase 42 11 - 51 U/L  CBC with Differential     Status: Abnormal    Collection Time: 07/12/17  5:49 AM  Result Value  Ref Range    WBC 10.3 4.0 - 10.5 K/uL    RBC 5.88 (H) 3.87 - 5.11 MIL/uL    Hemoglobin 16.2 (H) 12.0 - 15.0 g/dL    HCT 48.9 (H) 36.0 - 46.0 %    MCV 83.2 78.0 - 100.0 fL    MCH 27.6 26.0 - 34.0 pg    MCHC 33.1 30.0 - 36.0 g/dL    RDW 15.1 11.5 - 15.5 %    Platelets 308 150 - 400 K/uL    Neutrophils Relative % 84 %    Neutro Abs 8.5 (H) 1.7 - 7.7 K/uL    Lymphocytes Relative 11 %    Lymphs Abs 1.2 0.7 - 4.0 K/uL    Monocytes Relative 5 %    Monocytes Absolute 0.5 0.1 - 1.0 K/uL    Eosinophils Relative 0 %    Eosinophils Absolute 0.0 0.0 - 0.7 K/uL    Basophils Relative 0 %    Basophils Absolute 0.0 0.0 - 0.1 K/uL  Urinalysis, Routine w reflex microscopic     Status: Abnormal    Collection Time: 07/12/17  7:19 AM  Result Value Ref Range    Color, Urine YELLOW YELLOW    APPearance HAZY (A) CLEAR    Specific Gravity, Urine 1.020 1.005 - 1.030    pH 5.0 5.0 - 8.0    Glucose, UA NEGATIVE NEGATIVE mg/dL    Hgb urine dipstick LARGE (A) NEGATIVE    Bilirubin Urine NEGATIVE NEGATIVE    Ketones, ur 20 (A) NEGATIVE mg/dL    Protein, ur 30 (A) NEGATIVE mg/dL    Nitrite NEGATIVE NEGATIVE    Leukocytes, UA NEGATIVE NEGATIVE    RBC / HPF 0-5 0 - 5 RBC/hpf    WBC, UA 0-5 0 - 5 WBC/hpf    Bacteria, UA RARE (A) NONE SEEN    Squamous Epithelial / LPF 6-30 (A) NONE SEEN    Mucus PRESENT           Imaging Results (Last 48 hours)  Ct Abdomen Pelvis W Contrast   Result Date: 07/12/2017 CLINICAL DATA:  Nausea and vomiting.  History of colon carcinoma EXAM: CT ABDOMEN AND PELVIS WITH CONTRAST TECHNIQUE: Multidetector CT imaging of the abdomen and pelvis was performed using the standard protocol following bolus administration of intravenous contrast. CONTRAST:  186m ISOVUE-300 IOPAMIDOL (ISOVUE-300) INJECTION 61% COMPARISON:  September 19, 2016 FINDINGS: Lower chest: There is bibasilar atelectasis. No lung base edema or consolidation. Hepatobiliary: There is fatty infiltration near the fissure for  the ligamentum teres. No focal liver lesions are evident. Gallbladder wall is not appreciably thickened. There is no biliary duct dilatation. Pancreas: There is no pancreatic mass or inflammatory focus. Spleen: No splenic lesions are evident. Adrenals/Urinary Tract: Adrenals bilaterally appear unremarkable. Kidneys bilaterally show no evident mass or hydronephrosis on either side. There is no renal or ureteral calculus on either side. Urinary bladder is midline with wall thickness within normal limits. Stomach/Bowel: There is a ventral hernia which contains small bowel. There is dilatation of small bowel to the level of this hernia. A transition zone occurs within this ventral hernia along its rightward aspect consistent with bowel obstruction. There is no free air or portal venous air. No bowel pneumatosis evident. Evidence of previous surgery in the sigmoid region with patent anastomosis. Vascular/Lymphatic: No abdominal aortic aneurysm. No vascular lesions are appreciable. No adenopathy is appreciable in the abdomen or pelvis. Reproductive: Uterus is antegrade.  No pelvic mass evident. Other: Appendix appears normal.  No abscess or ascites evident in the abdomen or pelvis. As noted above, there is a ventral hernia containing small bowel with transition zone consistent with bowel obstruction within the rightward aspect of the hernia. The neck of the hernia measures 6.9 cm from right to left dimension and 6.2 cm from superior to inferior dimension. No omental lesions are evident. Musculoskeletal: There are no blastic or lytic bone lesions. No intramuscular or abdominal wall lesion evident. IMPRESSION: 1. Ventral hernia containing small bowel. There is dilatation of bowel proximal to this hernia. Along the rightward aspect of the hernia, there is a focal transition zone indicating small bowel obstruction. Hernia measures 6.9 cm from right to left dimension and 6.2 cm from superior to inferior dimension. 2.  Patent  anastomosis in the sigmoid colon region. 3.  No appreciable neoplastic focus. 4.  No abscess.  Appendix appears normal. 5.  No renal or ureteral calculus.  No hydronephrosis. Electronically Signed   By: Lowella Grip III M.D.   On: 07/12/2017 10:01       Review of Systems  Constitutional: Negative for chills, diaphoresis, fever, malaise/fatigue and weight loss.  HENT: Negative.   Eyes: Negative.   Respiratory: Negative.   Cardiovascular: Negative.   Gastrointestinal: Positive for nausea and vomiting. Negative for blood in stool, constipation, diarrhea and melena.       Some back pain started yesterday also  Genitourinary: Negative.   Musculoskeletal: Negative.   Skin: Negative.   Neurological: Negative.  Negative for weakness.  Endo/Heme/Allergies: Negative.   Psychiatric/Behavioral: Negative.     Blood pressure 124/88, pulse 88, temperature 98.1 F (36.7 C), temperature source Oral, resp. rate 16, last menstrual period 07/08/2017, SpO2 95 %. Physical Exam  Constitutional: She is oriented to person, place, and time. She appears well-developed and well-nourished. No distress.  BMI 58.5  HENT:  Head: Normocephalic and atraumatic.  Mouth/Throat: Oropharynx is clear and moist. No oropharyngeal exudate.  Eyes: Right eye exhibits no discharge. Left eye exhibits no discharge. No scleral icterus.  Pupils are equal  Neck: Normal range of motion. No JVD present. No tracheal deviation present. No thyromegaly present.  Cardiovascular: Normal rate, regular rhythm, normal heart sounds and intact distal pulses.  No murmur heard. Respiratory: Effort normal and breath sounds normal. No respiratory distress. She has no wheezes. She has no rales. She exhibits no tenderness.  GI: Soft. She exhibits no distension and no mass. There is no tenderness. There is no rebound and no guarding.  She has a 10 x 12 cm hernia from this side.  Easily reducible, not distended or tender.  Musculoskeletal: She  exhibits no edema or tenderness.  Lymphadenopathy:    She has no cervical adenopathy.  Neurological: She is alert and oriented to person, place, and time. No cranial nerve deficit.  Skin: Skin is warm and dry. No rash noted. She is not diaphoretic. No erythema. No pallor.  Psychiatric: She has a normal mood and affect. Her behavior is normal. Judgment and thought content normal.      Assessment/Plan: Nausea and vomiting/dehydration Ventral hernia - easily reducible - nontender  E Coli UTI - Improving Stage IIC (T4b, N0), adenocarcinoma the sigmoid colon - completed chemotherapy - in remission 09/2016, dr. Betsy Coder Hx of chronic kidney disease/UTI GERD Dyslipidemia BMI 56   Plan:  Admit, hydrate, if she has further nausea NG tube, SB protocol.  K pad and Robaxin for her back.  Magnesium is 2.6, will not need replacement.   Earnstine Regal  07/12/2017, 10:57 AM

## 2017-07-12 NOTE — ED Provider Notes (Signed)
[facility] DEPT Provider Note   CSN: [id number] Arrival date & time: 07/12/17  0422     History   Chief Complaint Chief Complaint  Patient presents with  . Back Pain  . Emesis    HPI Amy Bentley is a 47 y.o. female with a history of diverticulitis, recurrent UTIs, sigmoid colon cancer s/p resection, and obesity who presents to the emergency department with a chief complaint of nausea and vomiting that began 1 week ago.  She was seen on 01/08 at [facility] for continued nausea, vomiting, diarrhea, abdominal pain and back pain.  She was treated for a UTI and discharged with Zofran and Keflex.  Urine culture grew greater than 100,000 colonies of pansensitive E. Coli.  She reports that she was compliant with Keflex until 4 days ago when she developed persistent nausea and vomiting despite compliance with Zofran.  She reports that she has been unable to keep down any food or liquids for the last 4 days.  Her diarrhea has since resolved.  She denies fevers, but reports associated chills over the last 4 days.  She also endorses constant, non-radiating, severe, throbbing bilateral mid-back pain.  Pain is improved with laying flat and significantly worse with sitting upright. She denies abdominal pain at this time.  She denies dysuria, hematuria, vaginal pain or discharge, melena, hematochezia, chest pain, dyspnea, or hematemesis.   The history is provided by the patient. No language interpreter was used.    Past Medical History:  Diagnosis Date  . Anemia   . Chronic kidney disease    hx UTIs  . colon ca dx'd 09/2013  . GERD (gastroesophageal reflux disease)   . Hyperlipidemia     Patient Active Problem List   Diagnosis Date Noted  . Malignant neoplasm of sigmoid colon ([place]) 03/13/2014  . Family history of malignant neoplasm of gastrointestinal tract 03/13/2014  . Cancer of sigmoid colon, invading bladder, T4N0, s/p en bloc resection with Dr. Louis Meckel 4/24  11/06/2013  . Protein-calorie malnutrition, severe ([place]) 10/19/2013  . Iron deficiency anemia, multifactorial with GI/GU and menstrual losses contributory 10/18/2013  . Diverticulitis of colon (without mention of hemorrhage)(562.11) 10/18/2013  . Weight loss 10/17/2013  . Altered mental status 08/07/2013  . UTI (lower urinary tract infection) 08/07/2013  . Hypokalemia 08/07/2013  . Anemia 08/07/2013    Past Surgical History:  Procedure Laterality Date  . BREAST BIOPSY Right    stereo   . CESAREAN SECTION     x 2  . COLON RESECTION N/A 10/20/2013   Procedure: OPEN SIGMOID COLONECTOMY COLOVESICAL FISTULA REPAIR;  Surgeon: Stark Klein, MD;  Location: [facility];  Service: General;  Laterality: N/A;  . COLONOSCOPY WITH PROPOFOL N/A 08/16/2014   Procedure: COLONOSCOPY WITH PROPOFOL;  Surgeon: Milus Banister, MD;  Location: [facility];  Service: Endoscopy;  Laterality: N/A;  . CYSTECTOMY N/A 10/20/2013   Procedure: CYSTECTOMY PARTIAL;  Surgeon: Ardis Hughs, MD;  Location: [facility];  Service: Urology;  Laterality: N/A;  . PORTACATH PLACEMENT N/A 11/15/2013   Procedure: INSERTION PORT-A-CATH;  Surgeon: Stark Klein, MD;  Location: [facility];  Service: General;  Laterality: N/A;  . TUBAL LIGATION      OB History    Gravida Para Term Preterm AB Living   2 2 2     2    SAB TAB Ectopic Multiple Live Births                   Home Medications  Prior to Admission medications   Medication Sig Start Date End Date Taking? Authorizing Provider  cephALEXin (KEFLEX) 500 MG capsule Take 1 capsule (500 mg total) by mouth 2 (two) times daily for 7 days. 07/06/17 07/13/17 Yes Burky, Malachy Moan, NP  ibuprofen (ADVIL,MOTRIN) 200 MG tablet Take 600 mg by mouth every 6 (six) hours as needed for mild pain.   Yes [provider]  ondansetron (ZOFRAN) 4 MG tablet Take 1 tablet (4 mg total) by mouth every 8 (eight) hours as needed for nausea or vomiting. 07/06/17  Yes Zigmund Gottron, NP    Family  History Family History  Problem Relation Age of Onset  . Cancer Paternal Aunt 69       colon  . Colon cancer Paternal Aunt   . Hypertension Other   . Diabetes Other   . Esophageal cancer Neg Hx   . Rectal cancer Neg Hx   . Stomach cancer Neg Hx   . Breast cancer Neg Hx     Social History Social History   Tobacco Use  . Smoking status: Former Smoker    Packs/day: 0.50    Types: Cigarettes    Last attempt to quit: 10/17/2013    Years since quitting: 3.7  . Smokeless tobacco: Never Used  Substance Use Topics  . Alcohol use: No  . Drug use: No     Allergies   Bactrim [sulfamethoxazole-trimethoprim]   Review of Systems Review of Systems  Constitutional: Positive for chills. Negative for activity change and fever.  HENT: Negative for congestion.   Eyes: Negative for visual disturbance.  Respiratory: Negative for shortness of breath.   Cardiovascular: Negative for chest pain.  Gastrointestinal: Positive for diarrhea (resolved), nausea and vomiting. Negative for abdominal pain.  Genitourinary: Negative for dysuria, vaginal discharge and vaginal pain.  Musculoskeletal: Positive for back pain. Negative for arthralgias and myalgias.  Skin: Negative for rash.  Allergic/Immunologic: Positive for immunocompromised state.  Neurological: Negative for dizziness, weakness and numbness.  Psychiatric/Behavioral: Negative for confusion.     Physical Exam Updated Vital Signs BP 124/77   Pulse 76   Temp 98.1 F (36.7 C) (Oral)   Resp 19   LMP 07/08/2017 (Exact Date)   SpO2 98%   Physical Exam  Constitutional: No distress.  HENT:  Head: Normocephalic.  Eyes: Conjunctivae are normal. No scleral icterus.  Neck: Normal range of motion. Neck supple.  Cardiovascular: Normal rate, regular rhythm, normal heart sounds and intact distal pulses. Exam reveals no gallop and no friction rub.  No murmur heard. Pulmonary/Chest: Effort normal and breath sounds normal. No stridor. No  respiratory distress. She has no wheezes. She has no rales. She exhibits no tenderness.  Abdominal: Soft. Bowel sounds are normal. She exhibits no distension and no mass. There is tenderness. There is no rebound and no guarding. A hernia is present.  Obese abdomen.  Well-healed scar over the anterior abdomen. Reducible periumbilical hernia.  She has mild right upper quadrant tenderness, but no Murphy sign.  No rebound or guarding.  No flank tenderness bilaterally.  No peritoneal signs.  Positive CVA tenderness bilaterally.    Musculoskeletal:  No cervical, thoracic, or lumbar spinous process tenderness or tenderness to the surrounding paraspinal muscles.  Neurological: She is alert.  Skin: Skin is warm. Capillary refill takes 2 to 3 seconds. No rash noted.  Psychiatric: Her behavior is normal.  Nursing note and vitals reviewed.    ED Treatments / Results  Labs (all labs ordered are listed,  but only abnormal results are displayed) Labs Reviewed  COMPREHENSIVE METABOLIC PANEL - Abnormal; Notable for the following components:      Result Value   Potassium 2.9 (*)    Chloride 90 (*)    CO2 33 (*)    Glucose, Bld 135 (*)    Creatinine, Ser 1.26 (*)    Total Protein 8.4 (*)    GFR calc non Af Amer 51 (*)    GFR calc Af Amer 59 (*)    All other components within normal limits  CBC WITH DIFFERENTIAL/PLATELET - Abnormal; Notable for the following components:   RBC 5.88 (*)    Hemoglobin 16.2 (*)    HCT 48.9 (*)    Neutro Abs 8.5 (*)    All other components within normal limits  LIPASE, BLOOD  URINALYSIS, ROUTINE W REFLEX MICROSCOPIC    EKG  EKG Interpretation  Date/Time:  Monday July 12 2017 05:57:52 EST Ventricular Rate:  79 PR Interval:    QRS Duration: 91 QT Interval:  456 QTC Calculation: 523 R Axis:   65 Text Interpretation:  Sinus rhythm Right atrial enlargement Abnormal R-wave progression, early transition Borderline repolarization abnormality Prolonged QT interval  Since last tracing 07 Aug 2013 T wave inversion more evident in inferior leads Confirmed by Rolland Porter [phone]) on 07/12/2017 6:37:15 AM       Radiology No results found.  Procedures Procedures (including critical care time)  Medications Ordered in ED Medications  sodium chloride 0.9 % bolus 500 mL (not administered)  promethazine (PHENERGAN) injection 25 mg (25 mg Intravenous Given 07/12/17 0546)  sodium chloride 0.9 % bolus 1,000 mL (1,000 mLs Intravenous New Bag/Given 07/12/17 0559)  morphine 4 MG/ML injection 4 mg (4 mg Intravenous Given 07/12/17 0546)     Initial Impression / Assessment and Plan / ED Course  I have reviewed the triage vital signs and the nursing notes.  Pertinent labs & imaging results that were available during my care of the patient were reviewed by me and considered in my medical decision making (see chart for details).     47 year old female with a history of diverticulitis, recurrent UTIs, sigmoid colon cancer s/p resection, and obesity presenting with nausea and vomiting despite home Zofran, bilateral CVA tenderness, and chills. Afebrile in the ED. Nontachy and normotensive.  Clinically, she appears dehydrated.  No underlying kidney or heart disease.  IV fluid bolus given with Phenergan and morphine for pain control.  Recheck, the patient's pain is significantly improved and her nausea has resolved. CBC consistent with dehydration.  K 2.9, replenished in the ED. Cr 1.26, up from her baseline of 0.9. Bicarb 33. UA pending. Patient care transferred to PA Maczis at the end of my shift.  Plan to review urinalysis if concerning for pyelonephritis will treat accordingly.  If UA is remarkable, plan to repeat CT A/P given patient's h/o of colon CA, diverticulitis, and ovarian cysts. Patient presentation, ED course, and plan of care discussed with review of all pertinent labs and imaging. Please see his/her note for further details regarding further ED course and  disposition.   Final Clinical Impressions(s) / ED Diagnoses   Final diagnoses:  None    ED Discharge Orders    None       Joanne Gavel, PA-C 07/12/17 0729    Rolland Porter, MD 07/12/17 [phone]

## 2017-07-12 NOTE — ED Triage Notes (Signed)
Pt states she went to urgent care on Tuesday and was diagnosed with a UTI and was given medication for nausea, pain, and an antibiotic  Pt states she continues to have vomiting even with the medication and has not been able to hold her medication down  Pt states she feels dehydrated  Pt states she also continues to have pain in her back like back spasms   Pt states her sxs initially started Saturday a week ago

## 2017-07-12 NOTE — Progress Notes (Signed)
PHARMACY NOTE -  ANTIBIOTIC RENAL DOSE ADJUSTMENT   Request received for Pharmacy to assist with antibiotic renal dose adjustment.  Patient has been initiated on Ceftriaxone for E coli UTI. Ceftiraxone is not renally excreted, needs no monitoring Current dosage is appropriate and no need for further dosage adjustment for UTI Will sign off at this time.  Please reconsult if a change in clinical status warrants re-evaluation of dosage.  Minda Ditto PharmD Pager [phone] 07/12/2017, 12:26 PM

## 2017-07-12 NOTE — Progress Notes (Addendum)
ANTICOAGULATION CONSULT NOTE - Follow Up Consult  Pharmacy Consult for enoxaparin Indication: VTE prophylaxis  Allergies  Allergen Reactions  . Bactrim [Sulfamethoxazole-Trimethoprim]     Causes disorientation, inability to speak    Patient Measurements:   Heparin Dosing Weight:   Vital Signs: Temp: 98.1 F (36.7 C) (01/14 0428) Temp Source: Oral (01/14 0428) BP: 124/88 (01/14 1055) Pulse Rate: 88 (01/14 1055)  Labs: Recent Labs    07/12/17 0549  HGB 16.2*  HCT 48.9*  PLT 308  CREATININE 1.26*    CrCl cannot be calculated (Unknown ideal weight.).   Assessment: 56 YOF presents with N/V and abdominal pain.  Pharmacy asked to dose enoxaparin for VTE prophylaxis.   Renal SCr elevated, h/o CKD but SCr higher than recent values, suspect prerenal from dehydration  Weight = 152 kg last April   CBCL Hgb increased (likely concentrated from dehydration) and pltc WNL   Goal of Therapy:  Anti-Xa level 0.3 - 0.6 units/ml 4hrs after LMWH dose given    Plan:   Enoxaparin 60mg  SQ q24h for BMI > 30  No further adjust needed, pharmacy will follow peripherally  Doreene Eland, PharmD, BCPS.   Pager: [phone] 07/12/2017 12:37 PM

## 2017-07-12 NOTE — ED Provider Notes (Signed)
Patient signed out to me by Joline Maxcy, PA-C at change of shift.  Please see her note for more details.  Briefly this is a 47 year old female with a history of diverticulitis, requiring UTIs, sigmoid colon cancer status post resection presenting for nausea and vomiting that began 1 week ago.  Patient was seen on 1/8 at urgent care for symptoms she is presenting for today and treated for UTI and discharged with Zofran and Keflex.  Urine culture grew greater than 100,000 colonies of pansensitive E. coli.  She was compliant with the Keflex until 4 days ago when she developed persistent nausea and vomiting despite treatment with Zofran.  She is presenting today for 4 days of persistent nausea, non-bilious vomiting and bilateral mid back pain.   Patient's blood pressure was noted to be soft upon presentation.  IV fluid bolus was given with resolution of this.  Her labs are consistent with dehydration.  Potassium was 2.9 and was placed in the ED.  UA pending and case signed out to myself.  Plan to review urinalysis concerning for pyelonephritis will treat accordingly.  If UA is unremarkable plan for CT of the abdomen and pelvis.  Physical Exam  BP 124/77   Pulse 76   Temp 98.1 F (36.7 C) (Oral)   Resp 19   LMP 07/08/2017 (Exact Date)   SpO2 98%   Physical Exam  Constitutional: She appears well-developed and well-nourished.  Obese female resting comfortably in no acute distress.  Nontoxic appearing.  HENT:  Head: Normocephalic and atraumatic.  Right Ear: External ear normal.  Left Ear: External ear normal.  Nose: Nose normal.  Mouth/Throat: Uvula is midline, oropharynx is clear and moist and mucous membranes are normal. No tonsillar exudate.  Eyes: Pupils are equal, round, and reactive to light. Right eye exhibits no discharge. Left eye exhibits no discharge. No scleral icterus.  Neck: Trachea normal. Neck supple. No spinous process tenderness present. No neck rigidity. Normal range of motion  present.  Cardiovascular: Normal rate, regular rhythm and intact distal pulses.  No murmur heard. Pulses:      Radial pulses are 2+ on the right side, and 2+ on the left side.       Dorsalis pedis pulses are 2+ on the right side, and 2+ on the left side.       Posterior tibial pulses are 2+ on the right side, and 2+ on the left side.  No lower extremity swelling or edema. Calves symmetric in size bilaterally.  Pulmonary/Chest: Effort normal and breath sounds normal. She exhibits no tenderness.  Abdominal: Soft. Bowel sounds are normal. She exhibits no distension. There is no tenderness. There is CVA tenderness (bilaterally). There is no rigidity, no rebound and no guarding.  There is a well healed midline scar of the mid abdomen.  There is a reducible supraumbilical ventral hernia without evidence of strangulation. After reduction the hernia immediately returns.  Musculoskeletal: She exhibits no edema.  Lymphadenopathy:    She has no cervical adenopathy.  Neurological: She is alert.  Skin: Skin is warm and dry. No rash noted. She is not diaphoretic.  Psychiatric: She has a normal mood and affect.  Nursing note and vitals reviewed.   ED Course/Procedures     Results for orders placed or performed during the hospital encounter of 07/12/17  Comprehensive metabolic panel  Result Value Ref Range   Sodium 136 135 - 145 mmol/L   Potassium 2.9 (L) 3.5 - 5.1 mmol/L   Chloride 90 (L)  101 - 111 mmol/L   CO2 33 (H) 22 - 32 mmol/L   Glucose, Bld 135 (H) 65 - 99 mg/dL   BUN 19 6 - 20 mg/dL   Creatinine, Ser 1.26 (H) 0.44 - 1.00 mg/dL   Calcium 9.5 8.9 - 10.3 mg/dL   Total Protein 8.4 (H) 6.5 - 8.1 g/dL   Albumin 4.5 3.5 - 5.0 g/dL   AST 23 15 - 41 U/L   ALT 26 14 - 54 U/L   Alkaline Phosphatase 96 38 - 126 U/L   Total Bilirubin 1.1 0.3 - 1.2 mg/dL   GFR calc non Af Amer 51 (L) >60 mL/min   GFR calc Af Amer 59 (L) >60 mL/min   Anion gap 13 5 - 15  Lipase, blood  Result Value Ref Range    Lipase 42 11 - 51 U/L  Urinalysis, Routine w reflex microscopic  Result Value Ref Range   Color, Urine YELLOW YELLOW   APPearance HAZY (A) CLEAR   Specific Gravity, Urine 1.020 1.005 - 1.030   pH 5.0 5.0 - 8.0   Glucose, UA NEGATIVE NEGATIVE mg/dL   Hgb urine dipstick LARGE (A) NEGATIVE   Bilirubin Urine NEGATIVE NEGATIVE   Ketones, ur 20 (A) NEGATIVE mg/dL   Protein, ur 30 (A) NEGATIVE mg/dL   Nitrite NEGATIVE NEGATIVE   Leukocytes, UA NEGATIVE NEGATIVE   RBC / HPF 0-5 0 - 5 RBC/hpf   WBC, UA 0-5 0 - 5 WBC/hpf   Bacteria, UA RARE (A) NONE SEEN   Squamous Epithelial / LPF 6-30 (A) NONE SEEN   Mucus PRESENT   CBC with Differential  Result Value Ref Range   WBC 10.3 4.0 - 10.5 K/uL   RBC 5.88 (H) 3.87 - 5.11 MIL/uL   Hemoglobin 16.2 (H) 12.0 - 15.0 g/dL   HCT 48.9 (H) 36.0 - 46.0 %   MCV 83.2 78.0 - 100.0 fL   MCH 27.6 26.0 - 34.0 pg   MCHC 33.1 30.0 - 36.0 g/dL   RDW 15.1 11.5 - 15.5 %   Platelets 308 150 - 400 K/uL   Neutrophils Relative % 84 %   Neutro Abs 8.5 (H) 1.7 - 7.7 K/uL   Lymphocytes Relative 11 %   Lymphs Abs 1.2 0.7 - 4.0 K/uL   Monocytes Relative 5 %   Monocytes Absolute 0.5 0.1 - 1.0 K/uL   Eosinophils Relative 0 %   Eosinophils Absolute 0.0 0.0 - 0.7 K/uL   Basophils Relative 0 %   Basophils Absolute 0.0 0.0 - 0.1 K/uL   No results found.  Procedures  MDM    Briefly this is a 47 year old female with a history of diverticulitis, requiring UTIs, sigmoid colon cancer status post resection presenting for nausea and vomiting that began 1 week ago.  Patient was seen on 1/8 at urgent care for symptoms she is presenting for today and treated for UTI and discharged with Zofran and Keflex.  Urine culture grew greater than 100,000 colonies of pansensitive E. coli.  She was compliant with the Keflex until 4 days ago when she developed persistent nausea and vomiting despite treatment with Zofran.  She is presenting today for 4 days of persistent nausea, non-bilious  vomiting and bilateral mid back pain.   Patient's blood pressure was noted to be soft upon presentation.  IV fluid bolus was given with resolution of this.  Her labs are consistent with dehydration.  Potassium was 2.9 and was replaced in the ED. LFTs reassuring.  Elevation  from patient's baseline creatinine of 1.0 to 1.26. Lipase within normal limits.  No leukocytosis.  No anemia UA pending and case signed out to myself.  Plan to review urinalysis concerning for pyelonephritis will treat accordingly.  If UA is unremarkable for UTI. Will obtain CT of the abdomen and pelvis.   UA unremarkable for UTI or Pyelo. Plan for CT scan. Patient has been without emesis in the department. Current pain level 5/10.  Will give morphine.  Patient states that last bowel movement was on Thursday.  She still passing gas.  Previous abdominal surgery includes sigmoidectomy by Dr. [name] in 2015.  CT scan shows a ventral hernia containing small bowel.  Hernia measures 6.9 cm from the right to left dimension and 6.2 cm from superior to inferior dimension.  There is a focal transition zone indicating a small bowel obstruction.  Evaluation of this the patient's hernia is reducible but immediately returns.  She says this area is sore but not very tender.  She has been without emesis in the department.  Will consult surgery due to reading of CT indicating small bowel obstruction and obstructive signs including nausea and vomiting.  Dr. Dalbert Batman of general surgery to see the patient. Appreciate the consult.   General surgery to admit the patient.  Patient is in agreement with plan and appears safe for admission.       Lorelle Gibbs 07/12/17 1434    Virgel Manifold, MD 07/12/17 1630

## 2017-07-13 ENCOUNTER — Observation Stay (HOSPITAL_COMMUNITY): Payer: BLUE CROSS/BLUE SHIELD

## 2017-07-13 ENCOUNTER — Other Ambulatory Visit: Payer: BLUE CROSS/BLUE SHIELD

## 2017-07-13 ENCOUNTER — Inpatient Hospital Stay (HOSPITAL_COMMUNITY): Payer: BLUE CROSS/BLUE SHIELD

## 2017-07-13 ENCOUNTER — Ambulatory Visit: Payer: BLUE CROSS/BLUE SHIELD | Admitting: Oncology

## 2017-07-13 DIAGNOSIS — N179 Acute kidney failure, unspecified: Secondary | ICD-10-CM | POA: Diagnosis not present

## 2017-07-13 DIAGNOSIS — R000 Tachycardia, unspecified: Secondary | ICD-10-CM | POA: Diagnosis not present

## 2017-07-13 DIAGNOSIS — Z9221 Personal history of antineoplastic chemotherapy: Secondary | ICD-10-CM | POA: Diagnosis not present

## 2017-07-13 DIAGNOSIS — K565 Intestinal adhesions [bands], unspecified as to partial versus complete obstruction: Secondary | ICD-10-CM | POA: Diagnosis not present

## 2017-07-13 DIAGNOSIS — E43 Unspecified severe protein-calorie malnutrition: Secondary | ICD-10-CM | POA: Diagnosis not present

## 2017-07-13 DIAGNOSIS — Z87891 Personal history of nicotine dependence: Secondary | ICD-10-CM | POA: Diagnosis not present

## 2017-07-13 DIAGNOSIS — Z87440 Personal history of urinary (tract) infections: Secondary | ICD-10-CM | POA: Diagnosis not present

## 2017-07-13 DIAGNOSIS — K219 Gastro-esophageal reflux disease without esophagitis: Secondary | ICD-10-CM | POA: Diagnosis not present

## 2017-07-13 DIAGNOSIS — K436 Other and unspecified ventral hernia with obstruction, without gangrene: Secondary | ICD-10-CM | POA: Diagnosis not present

## 2017-07-13 DIAGNOSIS — N390 Urinary tract infection, site not specified: Secondary | ICD-10-CM | POA: Diagnosis not present

## 2017-07-13 DIAGNOSIS — E876 Hypokalemia: Secondary | ICD-10-CM | POA: Diagnosis present

## 2017-07-13 DIAGNOSIS — K56600 Partial intestinal obstruction, unspecified as to cause: Secondary | ICD-10-CM | POA: Diagnosis not present

## 2017-07-13 DIAGNOSIS — E785 Hyperlipidemia, unspecified: Secondary | ICD-10-CM | POA: Diagnosis not present

## 2017-07-13 DIAGNOSIS — Z888 Allergy status to other drugs, medicaments and biological substances status: Secondary | ICD-10-CM | POA: Diagnosis not present

## 2017-07-13 DIAGNOSIS — Z79899 Other long term (current) drug therapy: Secondary | ICD-10-CM | POA: Diagnosis not present

## 2017-07-13 DIAGNOSIS — Z6841 Body Mass Index (BMI) 40.0 and over, adult: Secondary | ICD-10-CM | POA: Diagnosis not present

## 2017-07-13 DIAGNOSIS — R34 Anuria and oliguria: Secondary | ICD-10-CM | POA: Diagnosis not present

## 2017-07-13 DIAGNOSIS — R944 Abnormal results of kidney function studies: Secondary | ICD-10-CM | POA: Diagnosis not present

## 2017-07-13 DIAGNOSIS — K56609 Unspecified intestinal obstruction, unspecified as to partial versus complete obstruction: Secondary | ICD-10-CM | POA: Diagnosis not present

## 2017-07-13 DIAGNOSIS — E860 Dehydration: Secondary | ICD-10-CM | POA: Diagnosis present

## 2017-07-13 DIAGNOSIS — R252 Cramp and spasm: Secondary | ICD-10-CM | POA: Diagnosis not present

## 2017-07-13 DIAGNOSIS — R112 Nausea with vomiting, unspecified: Secondary | ICD-10-CM | POA: Diagnosis not present

## 2017-07-13 DIAGNOSIS — M546 Pain in thoracic spine: Secondary | ICD-10-CM | POA: Diagnosis present

## 2017-07-13 DIAGNOSIS — N189 Chronic kidney disease, unspecified: Secondary | ICD-10-CM | POA: Diagnosis present

## 2017-07-13 DIAGNOSIS — B9620 Unspecified Escherichia coli [E. coli] as the cause of diseases classified elsewhere: Secondary | ICD-10-CM | POA: Diagnosis present

## 2017-07-13 DIAGNOSIS — K439 Ventral hernia without obstruction or gangrene: Secondary | ICD-10-CM | POA: Diagnosis not present

## 2017-07-13 DIAGNOSIS — N320 Bladder-neck obstruction: Secondary | ICD-10-CM | POA: Diagnosis not present

## 2017-07-13 DIAGNOSIS — K430 Incisional hernia with obstruction, without gangrene: Secondary | ICD-10-CM | POA: Diagnosis not present

## 2017-07-13 DIAGNOSIS — Z85038 Personal history of other malignant neoplasm of large intestine: Secondary | ICD-10-CM | POA: Diagnosis not present

## 2017-07-13 DIAGNOSIS — R338 Other retention of urine: Secondary | ICD-10-CM | POA: Diagnosis not present

## 2017-07-13 LAB — BASIC METABOLIC PANEL
Anion gap: 10 (ref 5–15)
BUN: 15 mg/dL (ref 6–20)
CO2: 31 mmol/L (ref 22–32)
Calcium: 8.8 mg/dL — ABNORMAL LOW (ref 8.9–10.3)
Chloride: 97 mmol/L — ABNORMAL LOW (ref 101–111)
Creatinine, Ser: 1.18 mg/dL — ABNORMAL HIGH (ref 0.44–1.00)
GFR calc Af Amer: >60 mL/min (ref >60)
GFR calc non Af Amer: 55 mL/min — ABNORMAL LOW (ref >60)
Glucose, Bld: 116 mg/dL — ABNORMAL HIGH (ref 65–99)
Potassium: 3 mmol/L — ABNORMAL LOW (ref 3.5–5.1)
Sodium: 138 mmol/L (ref 135–145)

## 2017-07-13 LAB — CBC
HCT: 45.6 % (ref 36.0–46.0)
Hemoglobin: 14.8 g/dL (ref 12.0–15.0)
MCH: 27.5 pg (ref 26.0–34.0)
MCHC: 32.5 g/dL (ref 30.0–36.0)
MCV: 84.8 fL (ref 78.0–100.0)
Platelets: 281 10*3/uL (ref 150–400)
RBC: 5.38 MIL/uL — ABNORMAL HIGH (ref 3.87–5.11)
RDW: 15.6 % — ABNORMAL HIGH (ref 11.5–15.5)
WBC: 7.5 10*3/uL (ref 4.0–10.5)

## 2017-07-13 LAB — HIV ANTIBODY (ROUTINE TESTING W REFLEX): HIV Screen 4th Generation wRfx: NONREACTIVE

## 2017-07-13 MED ORDER — ENOXAPARIN SODIUM 40 MG/0.4ML ~~LOC~~ SOLN
40.0000 mg | Freq: Two times a day (BID) | SUBCUTANEOUS | Status: DC
  Administered 2017-07-13 – 2017-07-19 (×13): 40 mg via SUBCUTANEOUS
  Filled 2017-07-13 (×14): qty 0.4

## 2017-07-13 MED ORDER — DIATRIZOATE MEGLUMINE & SODIUM 66-10 % PO SOLN
90.0000 mL | Freq: Once | ORAL | Status: AC | PRN
  Administered 2017-07-13: 90 mL via ORAL
  Filled 2017-07-13: qty 90

## 2017-07-13 MED ORDER — CEFTRIAXONE SODIUM 2 G IJ SOLR
2.0000 g | INTRAMUSCULAR | Status: AC
  Administered 2017-07-14: 2 g via INTRAVENOUS
  Filled 2017-07-13 (×2): qty 2

## 2017-07-13 MED ORDER — DIATRIZOATE MEGLUMINE & SODIUM 66-10 % PO SOLN
90.0000 mL | Freq: Once | ORAL | Status: AC
  Administered 2017-07-13: 90 mL via NASOGASTRIC
  Filled 2017-07-13: qty 90

## 2017-07-13 MED ORDER — KCL IN DEXTROSE-NACL 40-5-0.9 MEQ/L-%-% IV SOLN
INTRAVENOUS | Status: DC
  Administered 2017-07-13 – 2017-07-14 (×2): via INTRAVENOUS
  Filled 2017-07-13 (×3): qty 1000

## 2017-07-13 NOTE — Progress Notes (Addendum)
NGT placed by DG(radiology) circa 1630. Pt bilous green output is 1056ml at 1649.

## 2017-07-13 NOTE — Progress Notes (Signed)
PHARMACY NOTE -  Ceftriaxone  Pharmacy has been assisting with dosing of Ceftriaxone for UTI. Dosage increased to 2g IV q24h for weight 151.5kg, BMI > 30. Need for further dosage adjustment appears unlikely at present.    Will sign off at this time.  Please reconsult if a change in clinical status warrants re-evaluation of dosage.   Lindell Spar, PharmD, BCPS Pager: [phone] 07/13/2017 12:20 PM

## 2017-07-13 NOTE — Progress Notes (Signed)
Pt vomited 373ml greenish bilious emesis

## 2017-07-13 NOTE — Progress Notes (Signed)
ANTICOAGULATION CONSULT NOTE - Follow Up Consult  Pharmacy Consult for enoxaparin Indication: VTE prophylaxis  Allergies  Allergen Reactions  . Bactrim [Sulfamethoxazole-Trimethoprim]     Causes disorientation, inability to speak    Patient Measurements: Height: 5\' 1"  (154.9 cm) Weight: (!) 334 lb (151.5 kg) IBW/kg (Calculated) : 47.8 Heparin Dosing Weight:   Vital Signs: Temp: 98.4 F (36.9 C) (01/15 0500) Temp Source: Oral (01/15 0500) BP: 123/96 (01/15 0500) Pulse Rate: 94 (01/15 0500)  Labs: Recent Labs    07/12/17 0549 07/13/17 0418  HGB 16.2* 14.8  HCT 48.9* 45.6  PLT 308 281  CREATININE 1.26* 1.18*    Estimated Creatinine Clearance: 85.8 mL/min (A) (by C-G formula based on SCr of 1.18 mg/dL (H)).   Assessment: 79 YOF presents with N/V and abdominal pain.  Pharmacy asked to dose enoxaparin for VTE prophylaxis.   Renal: h/o CKD, SCr improved to 1.18 today  Weight = 151.5 kg   CBC: H/H, Pltc WNL  No indication for surgery at this time per [facility] note  No bleeding reported per nursing   Goal of Therapy:  Anti-Xa level 0.3 - 0.6 units/ml 4hrs after LMWH dose given    Plan:   Adjust Enoxaparin to 40mg  SQ q12h (~ 0.5 mg/kg/day divided in 2 doses) for BMI > 30  Monitor renal function, CBC, and for s/sx of bleeding  No further adjust needed, pharmacy will follow peripherally   Lindell Spar, PharmD, BCPS Pager: [phone] 07/13/2017 12:11 PM

## 2017-07-13 NOTE — Progress Notes (Signed)
    CC:  Nausea & vomiting  Subjective: No pain or nausea.  Nursing tried to get an NG in and failed.  Passing some flatus.  Objective: Vital signs in last 24 hours: Temp:  [97.9 F (36.6 C)-99.3 F (37.4 C)] 98.4 F (36.9 C) (01/15 0500) Pulse Rate:  [78-112] 94 (01/15 0500) Resp:  [15-18] 18 (01/15 0500) BP: (112-136)/(78-97) 123/96 (01/15 0500) SpO2:  [94 %-100 %] 100 % (01/15 0500) Last BM Date: 07/08/17 3149 IV Voided x 5 Afebrile, VSS, DBP up a couple times K+ 3.0 Creatinine is better WBC normal   H/H is better    Intake/Output from previous day: 01/14 0701 - 01/15 0700 In: 3149.6 [I.V.:1339.6; IV Piggyback:1810] Out: -  Intake/Output this shift: No intake/output data recorded.  General appearance: alert, cooperative and no distress Resp: clear to auscultation bilaterally GI: soft, hernia is reduced lying down.  No pain, some soreness, + flatus, no BM  Lab Results:  Recent Labs    07/12/17 0549 07/13/17 0418  WBC 10.3 7.5  HGB 16.2* 14.8  HCT 48.9* 45.6  PLT 308 281    BMET Recent Labs    07/12/17 0549 07/13/17 0418  NA 136 138  K 2.9* 3.0*  CL 90* 97*  CO2 33* 31  GLUCOSE 135* 116*  BUN 19 15  CREATININE 1.26* 1.18*  CALCIUM 9.5 8.8*   PT/INR No results for input(s): LABPROT, INR in the last 72 hours.  Recent Labs  Lab 07/12/17 0549  AST 23  ALT 26  ALKPHOS 96  BILITOT 1.1  PROT 8.4*  ALBUMIN 4.5     Lipase     Component Value Date/Time   LIPASE 42 07/12/2017 0549     Medications: . enoxaparin (LOVENOX) injection  60 mg Subcutaneous Q24H    Assessment/Plan Nausea and vomiting/dehydration Ventral hernia - easily reducible - nontender  E Coli UTI - Improving Stage IIC (T4b, N0), adenocarcinoma the sigmoid colon- completed chemotherapy - in remission 09/2016, dr. Betsy Coder Hx of chronic kidney disease/UTI GERD Dyslipidemia BMI 56  FEN:  NPO/IV fluids ID: none Foley: none  Follow up:  TBD   Plan:  SBP now  without NG  LOS: 0 days    Britain Saber 07/13/2017 [phone]

## 2017-07-14 ENCOUNTER — Inpatient Hospital Stay (HOSPITAL_COMMUNITY): Payer: BLUE CROSS/BLUE SHIELD

## 2017-07-14 LAB — CBC
HCT: 47.5 % — ABNORMAL HIGH (ref 36.0–46.0)
Hemoglobin: 15.4 g/dL — ABNORMAL HIGH (ref 12.0–15.0)
MCH: 27.8 pg (ref 26.0–34.0)
MCHC: 32.4 g/dL (ref 30.0–36.0)
MCV: 85.9 fL (ref 78.0–100.0)
Platelets: 292 10*3/uL (ref 150–400)
RBC: 5.53 MIL/uL — ABNORMAL HIGH (ref 3.87–5.11)
RDW: 15.7 % — ABNORMAL HIGH (ref 11.5–15.5)
WBC: 8.5 10*3/uL (ref 4.0–10.5)

## 2017-07-14 LAB — URINALYSIS, ROUTINE W REFLEX MICROSCOPIC
Glucose, UA: NEGATIVE mg/dL
Ketones, ur: 5 mg/dL — AB
Leukocytes, UA: NEGATIVE
Nitrite: NEGATIVE
Protein, ur: 30 mg/dL — AB
Specific Gravity, Urine: 1.041 — ABNORMAL HIGH (ref 1.005–1.030)
pH: 5 (ref 5.0–8.0)

## 2017-07-14 LAB — BASIC METABOLIC PANEL
Anion gap: 13 (ref 5–15)
BUN: 14 mg/dL (ref 6–20)
CO2: 30 mmol/L (ref 22–32)
Calcium: 9.5 mg/dL (ref 8.9–10.3)
Chloride: 100 mmol/L — ABNORMAL LOW (ref 101–111)
Creatinine, Ser: 1.25 mg/dL — ABNORMAL HIGH (ref 0.44–1.00)
GFR calc Af Amer: 60 mL/min — ABNORMAL LOW (ref >60)
GFR calc non Af Amer: 52 mL/min — ABNORMAL LOW (ref >60)
Glucose, Bld: 119 mg/dL — ABNORMAL HIGH (ref 65–99)
Potassium: 3.3 mmol/L — ABNORMAL LOW (ref 3.5–5.1)
Sodium: 143 mmol/L (ref 135–145)

## 2017-07-14 MED ORDER — KCL IN DEXTROSE-NACL 40-5-0.9 MEQ/L-%-% IV SOLN: INTRAVENOUS | Status: DC

## 2017-07-14 MED ORDER — KCL IN DEXTROSE-NACL 40-5-0.9 MEQ/L-%-% IV SOLN
INTRAVENOUS | Status: DC
Start: 2017-07-14 — End: 2017-07-15
  Filled 2017-07-14 (×2): qty 1000

## 2017-07-14 MED ORDER — KCL IN DEXTROSE-NACL 40-5-0.9 MEQ/L-%-% IV SOLN
INTRAVENOUS | Status: AC
  Filled 2017-07-14: qty 1000

## 2017-07-14 MED ORDER — POTASSIUM CHLORIDE 10 MEQ/100ML IV SOLN
10.0000 meq | INTRAVENOUS | Status: AC
  Administered 2017-07-14 (×4): 10 meq via INTRAVENOUS
  Filled 2017-07-14 (×4): qty 100

## 2017-07-14 NOTE — Progress Notes (Signed)
CC: Nausea and vomiting  Subjective: Patient feels better with the NG in.  She continues to drain a large volume of fluid.  Objective: Vital signs in last 24 hours: Temp:  [98.6 F (37 C)-99.1 F (37.3 C)] 99.1 F (37.3 C) (01/16 0530) Pulse Rate:  [98-99] 98 (01/16 0530) Resp:  [18] 18 (01/16 0530) BP: (112-120)/(68-93) 113/87 (01/16 0530) SpO2:  [93 %-98 %] 93 % (01/16 0530) Weight:  [151.5 kg (334 lb)] 151.5 kg (334 lb) (01/15 1000) Last BM Date: 07/08/17 1657 IV Voided x3 NG/emesis  2800 Afebrile, T-max 99.1, vital signs are stable Labs pending Small bowel protocol restarted yesterday a second dose given at 2355 hrs. last p.m. Films are pending. Film this a.m. shows the NG tube in the fourth part of the duodenum dilated small bowel with loops of air-fluid levels in left upper quadrant still compatible with a small bowel obstruction there is contrast in the right colon and the colon is decompressed.  Intake/Output from previous day: 01/15 0701 - 01/16 0700 In: 1657.9 [I.V.:1547.9; IV Piggyback:110] Out: 2800 [Emesis/NG output:2800] Intake/Output this shift: No intake/output data recorded.  General appearance: alert, cooperative and no distress Resp: clear to auscultation bilaterally GI: Softer this a.m. no distention.  Nausea is relieved.  Putting out a good deal through her NG tube.  Lab Results:  Recent Labs    07/12/17 0549 07/13/17 0418  WBC 10.3 7.5  HGB 16.2* 14.8  HCT 48.9* 45.6  PLT 308 281    BMET Recent Labs    07/12/17 0549 07/13/17 0418  NA 136 138  K 2.9* 3.0*  CL 90* 97*  CO2 33* 31  GLUCOSE 135* 116*  BUN 19 15  CREATININE 1.26* 1.18*  CALCIUM 9.5 8.8*   PT/INR No results for input(s): LABPROT, INR in the last 72 hours.  Recent Labs  Lab 07/12/17 0549  AST 23  ALT 26  ALKPHOS 96  BILITOT 1.1  PROT 8.4*  ALBUMIN 4.5     Lipase     Component Value Date/Time   LIPASE 42 07/12/2017 0549     Medications: .  enoxaparin (LOVENOX) injection  40 mg Subcutaneous Q12H   . cefTRIAXone (ROCEPHIN)  IV    . dextrose 5 % and 0.9 % NaCl with KCl 40 mEq/L 125 mL/hr at 07/14/17 0001  . methocarbamol (ROBAXIN)  IV 500 mg (07/14/17 0923)   Anti-infectives (From admission, onward)   Start     Dose/Rate Route Frequency Ordered Stop   07/13/17 1400  cefTRIAXone (ROCEPHIN) 2 g in dextrose 5 % 50 mL IVPB     2 g 100 mL/hr over 30 Minutes Intravenous Every 24 hours 07/13/17 1218     07/12/17 1300  cefTRIAXone (ROCEPHIN) 1 g in dextrose 5 % 50 mL IVPB  Status:  Discontinued     1 g 100 mL/hr over 30 Minutes Intravenous Every 24 hours 07/12/17 1222 07/13/17 1218      Assessment/Plan Nausea and vomiting/dehydration Ventral hernia - easily reducible - nontender   ongoing nausea and vomiting NG per IR 07/13/17 - 2800 ml last PM E Coli UTI- Improving - recheck urine Stage IIC (T4b, N0), adenocarcinoma the sigmoid colon- completed chemotherapy - in remission 09/2016, dr. Betsy Coder Hx of chronic kidney disease/UTI GERD Dyslipidemia BMI 56  FEN:  NPO/IV fluids DVT: Lovenox ID: Keflex times 4 days prior to admission/Rocephin 1/14 =>> day 3 Foley: none  Follow up:  TBD   Plan: Continue NG decompression for  now.  She does have contrast in the right colon so at least some is gotten through.  Start to mobilize more.  Recheck film in a.m. Stop Rocephin after dose today.        LOS: 1 day    Amy Bentley 07/14/2017 [phone]

## 2017-07-15 ENCOUNTER — Inpatient Hospital Stay (HOSPITAL_COMMUNITY): Payer: BLUE CROSS/BLUE SHIELD

## 2017-07-15 LAB — CBC
HCT: 45.8 % (ref 36.0–46.0)
Hemoglobin: 14.7 g/dL (ref 12.0–15.0)
MCH: 27.8 pg (ref 26.0–34.0)
MCHC: 32.1 g/dL (ref 30.0–36.0)
MCV: 86.6 fL (ref 78.0–100.0)
Platelets: 267 10*3/uL (ref 150–400)
RBC: 5.29 MIL/uL — ABNORMAL HIGH (ref 3.87–5.11)
RDW: 15.8 % — ABNORMAL HIGH (ref 11.5–15.5)
WBC: 7.4 10*3/uL (ref 4.0–10.5)

## 2017-07-15 LAB — BASIC METABOLIC PANEL
Anion gap: 7 (ref 5–15)
BUN: 13 mg/dL (ref 6–20)
CO2: 29 mmol/L (ref 22–32)
Calcium: 9.1 mg/dL (ref 8.9–10.3)
Chloride: 108 mmol/L (ref 101–111)
Creatinine, Ser: 1.16 mg/dL — ABNORMAL HIGH (ref 0.44–1.00)
GFR calc Af Amer: >60 mL/min (ref >60)
GFR calc non Af Amer: 56 mL/min — ABNORMAL LOW (ref >60)
Glucose, Bld: 136 mg/dL — ABNORMAL HIGH (ref 65–99)
Potassium: 4.4 mmol/L (ref 3.5–5.1)
Sodium: 144 mmol/L (ref 135–145)

## 2017-07-15 LAB — MAGNESIUM: Magnesium: 2.7 mg/dL — ABNORMAL HIGH (ref 1.7–2.4)

## 2017-07-15 MED ORDER — PHENOL 1.4 % MT LIQD
1.0000 | OROMUCOSAL | Status: DC | PRN
  Filled 2017-07-15: qty 177

## 2017-07-15 MED ORDER — KCL-LACTATED RINGERS-D5W 20 MEQ/L IV SOLN
INTRAVENOUS | Status: DC
  Administered 2017-07-15 – 2017-07-18 (×5): via INTRAVENOUS
  Filled 2017-07-15 (×6): qty 1000

## 2017-07-15 NOTE — Progress Notes (Signed)
NGT pulled out 10 inches per MD order. Patient tolerated well. Will continue to monitor.

## 2017-07-15 NOTE — Progress Notes (Signed)
    CC:  Abdominal pain  Subjective: She looks and feels much better.  She said she would kill to have a glass of ice water.  Her abdomen is soft and nondistended.  Objective: Vital signs in last 24 hours: Temp:  [97.5 F (36.4 C)-98.3 F (36.8 C)] 98.3 F (36.8 C) (01/17 0507) Pulse Rate:  [85-111] 85 (01/17 0507) Resp:  [16-18] 18 (01/17 0507) BP: (124-137)/(88-100) 137/100 (01/17 0507) SpO2:  [96 %-100 %] 100 % (01/17 0507) Last BM Date: 07/08/17 862 IV - orders are for fluids at 125 per hour Urine x 3 recorded NG 3050 No BM Afebrile, BP up some K+ up to 4.4 WBC stable, Mag 2.7 Creatinine is 1.16 UA nitrate negative, many bacteria, 0-5 WBC & RBC Film shows contrast in the right and start of the Transverse colon  Intake/Output from previous day: 01/16 0701 - 01/17 0700 In: 862.5 [I.V.:397.5; IV Piggyback:465] Out: 4132 [Urine:3; Emesis/NG output:3050] Intake/Output this shift: No intake/output data recorded.  General appearance: alert, cooperative and no distress Resp: clear to auscultation bilaterally GI: soft, non-tender; bowel sounds normal; no masses,  no organomegaly  Lab Results:  Recent Labs    07/14/17 1005 07/15/17 0358  WBC 8.5 7.4  HGB 15.4* 14.7  HCT 47.5* 45.8  PLT 292 267    BMET Recent Labs    07/14/17 1005 07/15/17 0358  NA 143 144  K 3.3* 4.4  CL 100* 108  CO2 30 29  GLUCOSE 119* 136*  BUN 14 13  CREATININE 1.25* 1.16*  CALCIUM 9.5 9.1   PT/INR No results for input(s): LABPROT, INR in the last 72 hours.  Recent Labs  Lab 07/12/17 0549  AST 23  ALT 26  ALKPHOS 96  BILITOT 1.1  PROT 8.4*  ALBUMIN 4.5     Lipase     Component Value Date/Time   LIPASE 42 07/12/2017 0549     Medications: . enoxaparin (LOVENOX) injection  40 mg Subcutaneous Q12H   Anti-infectives (From admission, onward)   Start     Dose/Rate Route Frequency Ordered Stop   07/13/17 1400  cefTRIAXone (ROCEPHIN) 2 g in dextrose 5 % 50 mL IVPB     2 g 100 mL/hr over 30 Minutes Intravenous Every 24 hours 07/13/17 1218 07/14/17 1538   07/12/17 1300  cefTRIAXone (ROCEPHIN) 1 g in dextrose 5 % 50 mL IVPB  Status:  Discontinued     1 g 100 mL/hr over 30 Minutes Intravenous Every 24 hours 07/12/17 1222 07/13/17 1218     . dextrose 5 % and 0.9 % NaCl with KCl 40 mEq/L    . methocarbamol (ROBAXIN)  IV 500 mg (07/15/17 0600)    Assessment/Plan Nausea and vomiting/dehydration Ventral hernia - easily reducible - nontender   ongoing nausea and vomiting NG per IR 07/13/17 - 2800 ml last PM E Coli UTI- Improving - recheck urine Stage IIC (T4b, N0), adenocarcinoma the sigmoid colon- completed chemotherapy - in remission 09/2016, dr. Betsy Coder Hx of chronic kidney disease/UTI GERD Dyslipidemia BMI 56  FEN: NPO/IV fluids DVT: Lovenox ID: Keflex times 4 days prior to admission/Rocephin 1/14-1/16/19 Foley: none  Follow up: TBD   Plan: Clamp the NG.  Increase her mobilization.,  Decrease IV, hypokalemia has resolved.  Start her slowly on some clear liquids and see how she does.       LOS: 2 days    Jenine Krisher 07/15/2017 [phone]

## 2017-07-16 DIAGNOSIS — K432 Incisional hernia without obstruction or gangrene: Secondary | ICD-10-CM

## 2017-07-16 LAB — BASIC METABOLIC PANEL
Anion gap: 7 (ref 5–15)
BUN: 11 mg/dL (ref 6–20)
CO2: 29 mmol/L (ref 22–32)
Calcium: 9.4 mg/dL (ref 8.9–10.3)
Chloride: 105 mmol/L (ref 101–111)
Creatinine, Ser: 1.15 mg/dL — ABNORMAL HIGH (ref 0.44–1.00)
GFR calc Af Amer: >60 mL/min (ref >60)
GFR calc non Af Amer: 57 mL/min — ABNORMAL LOW (ref >60)
Glucose, Bld: 132 mg/dL — ABNORMAL HIGH (ref 65–99)
Potassium: 4.1 mmol/L (ref 3.5–5.1)
Sodium: 141 mmol/L (ref 135–145)

## 2017-07-16 NOTE — Progress Notes (Signed)
NGT clamped throughout shift, no c/o N/V. Tolerated well.

## 2017-07-16 NOTE — Progress Notes (Signed)
    CC: Abdominal pain and nausea.  Subjective: Doing well with NG clamped all day.  Tolerating clears.  Abdomen soft nontender.  Ventral hernia is present but easily reducible.  She has been up walking and has not obstructed up walking over the last 24 hours.  Objective: Vital signs in last 24 hours: Temp:  [98 F (36.7 C)-98.4 F (36.9 C)] 98.2 F (36.8 C) (01/18 0543) Pulse Rate:  [91-101] 91 (01/18 0543) Resp:  [16] 16 (01/18 0543) BP: (112-139)/(84-87) 135/87 (01/18 0543) SpO2:  [94 %-99 %] 97 % (01/18 0543) Last BM Date: 07/08/17 595 PO 1890 IV Urine x 4 350 NG - clamped most of the time. Afebrile, VSS Creatinine is stable. Potassium is 4.1   Intake/Output from previous day: 01/17 0701 - 01/18 0700 In: 2650 [P.O.:595; I.V.:1890; IV Piggyback:165] Out: 350 [Emesis/NG output:350] Intake/Output this shift: No intake/output data recorded.  General appearance: alert, cooperative and no distress Resp: clear to auscultation bilaterally GI: soft, non-tender; bowel sounds normal; no masses,  no organomegaly  Lab Results:  Recent Labs    07/14/17 1005 07/15/17 0358  WBC 8.5 7.4  HGB 15.4* 14.7  HCT 47.5* 45.8  PLT 292 267    BMET Recent Labs    07/15/17 0358 07/16/17 0342  NA 144 141  K 4.4 4.1  CL 108 105  CO2 29 29  GLUCOSE 136* 132*  BUN 13 11  CREATININE 1.16* 1.15*  CALCIUM 9.1 9.4   PT/INR No results for input(s): LABPROT, INR in the last 72 hours.  Recent Labs  Lab 07/12/17 0549  AST 23  ALT 26  ALKPHOS 96  BILITOT 1.1  PROT 8.4*  ALBUMIN 4.5     Lipase     Component Value Date/Time   LIPASE 42 07/12/2017 0549     Medications: . enoxaparin (LOVENOX) injection  40 mg Subcutaneous Q12H    Assessment/Plan Nausea and vomiting/dehydration Ventral hernia - easily reducible - nontender ongoing nausea and vomiting NG per IR 07/13/17 - 2800 ml last PM E Coli UTI- Improving- recheck urine Stage IIC (T4b, N0), adenocarcinoma the  sigmoid colon- completed chemotherapy - in remission 09/2016, dr. Betsy Coder Hx of chronic kidney disease/UTI GERD Dyslipidemia BMI 56  FEN: NPO/IV fluids ZES:[id number] MA:[id number] times 4 days prior to admission/Rocephin1/14-1/16/19 Foley: none  Follow up: TBD  Plan: Seen by Dr. Dalbert Batman we plan to pull the NG tube which I done.  Leave her on clears today if she does well advance her diet tomorrow.  Continue to mobilize.      LOS: 3 days    Koi Yarbro 07/16/2017 [phone]

## 2017-07-17 NOTE — Progress Notes (Signed)
[facility] Office:  [phone] General Surgery Progress Note   LOS: 4 days  POD -     Chief Complaint: Bowel obstruction  Assessment and Plan: 1.  SBO  She seems to be getting better.  Will advance diet to reg diet.  2.  E. Coli - UTI (07/06/2017)  It looks like she had a single dose of antibiotics  She is not symptomatic  3.  Stage IIC (T4b, N0), adenocarcinoma the sigmoid colon - completed chemotherapy - in remission 09/2016, dr. Betsy Coder 4.  Hx of chronic kidney disease 5.  GERD 6.  Morbid obesity/BMI 56 7.  DVT prophylaxis - Lovenox   Principal Problem:   Nausea and vomiting Active Problems:   Protein-calorie malnutrition, severe (HCC)   Cancer of sigmoid colon, invading bladder, T4N0, s/p en bloc resection with Dr. Louis Meckel 4/24   Incisional hernia, reducible   Subjective:  Cramping in her left arm.  Had BM.  Taking po's.  Objective:   Vitals:   07/16/17 1956 07/17/17 0513  BP: 119/90 (!) 110/94  Pulse: (!) 110 (!) 108  Resp: 14 16  Temp: 98.3 F (36.8 C) 99 F (37.2 C)  SpO2: 100% 99%     Intake/Output from previous day:  01/18 0701 - 01/19 0700 In: 580 [P.O.:580] Out: 400 [Emesis/NG output:400]  Intake/Output this shift:  No intake/output data recorded.   Physical Exam:   General: Obese AA F who is alert and oriented.    HEENT: Normal. Pupils equal. .   Lungs: Clear   Abdomen: Soft.  Has mid line hernia that is soft and reducible.   Lab Results:    Recent Labs    07/14/17 1005 07/15/17 0358  WBC 8.5 7.4  HGB 15.4* 14.7  HCT 47.5* 45.8  PLT 292 267    BMET   Recent Labs    07/15/17 0358 07/16/17 0342  NA 144 141  K 4.4 4.1  CL 108 105  CO2 29 29  GLUCOSE 136* 132*  BUN 13 11  CREATININE 1.16* 1.15*  CALCIUM 9.1 9.4    PT/INR  No results for input(s): LABPROT, INR in the last 72 hours.  ABG  No results for input(s): PHART, HCO3 in the last 72 hours.  Invalid input(s): PCO2,  PO2   Studies/Results:  No results found.   Anti-infectives:   Anti-infectives (From admission, onward)   Start     Dose/Rate Route Frequency Ordered Stop   07/13/17 1400  cefTRIAXone (ROCEPHIN) 2 g in dextrose 5 % 50 mL IVPB     2 g 100 mL/hr over 30 Minutes Intravenous Every 24 hours 07/13/17 1218 07/14/17 1538   07/12/17 1300  cefTRIAXone (ROCEPHIN) 1 g in dextrose 5 % 50 mL IVPB  Status:  Discontinued     1 g 100 mL/hr over 30 Minutes Intravenous Every 24 hours 07/12/17 1222 07/13/17 1218      Alphonsa Overall, MD, FACS Pager: [facility] Office: [phone] 07/17/2017

## 2017-07-18 MED ORDER — MAGIC MOUTHWASH
15.0000 mL | Freq: Four times a day (QID) | ORAL | Status: DC | PRN
  Filled 2017-07-18: qty 15

## 2017-07-18 MED ORDER — METHOCARBAMOL 500 MG PO TABS: 1000.0000 mg | ORAL_TABLET | Freq: Four times a day (QID) | ORAL | Status: DC | PRN

## 2017-07-18 MED ORDER — ENSURE SURGERY PO LIQD
237.0000 mL | Freq: Two times a day (BID) | ORAL | Status: DC
  Administered 2017-07-19: 237 mL via ORAL
  Filled 2017-07-18 (×9): qty 237

## 2017-07-18 MED ORDER — LACTATED RINGERS IV SOLN
INTRAVENOUS | Status: DC
  Administered 2017-07-19 – 2017-07-21 (×4): via INTRAVENOUS

## 2017-07-18 MED ORDER — LACTATED RINGERS IV BOLUS (SEPSIS)
1000.0000 mL | Freq: Once | INTRAVENOUS | Status: AC
  Administered 2017-07-19: 1000 mL via INTRAVENOUS

## 2017-07-18 MED ORDER — LACTATED RINGERS IV BOLUS (SEPSIS): 1000.0000 mL | Freq: Three times a day (TID) | INTRAVENOUS | Status: AC | PRN

## 2017-07-18 MED ORDER — SACCHAROMYCES BOULARDII 250 MG PO CAPS
250.0000 mg | ORAL_CAPSULE | Freq: Two times a day (BID) | ORAL | Status: DC
  Administered 2017-07-18 – 2017-07-22 (×6): 250 mg via ORAL
  Filled 2017-07-18 (×6): qty 1

## 2017-07-18 MED ORDER — SODIUM CHLORIDE 0.9 % IV SOLN
250.0000 mL | INTRAVENOUS | Status: DC | PRN
  Administered 2017-07-21 (×2): via INTRAVENOUS

## 2017-07-18 MED ORDER — METOCLOPRAMIDE HCL 5 MG PO TABS: 5.0000 mg | ORAL_TABLET | Freq: Four times a day (QID) | ORAL | Status: DC | PRN

## 2017-07-18 MED ORDER — ONDANSETRON HCL 40 MG/20ML IJ SOLN
8.0000 mg | Freq: Four times a day (QID) | INTRAMUSCULAR | Status: DC | PRN
  Administered 2017-07-19 – 2017-07-20 (×4): 8 mg via INTRAVENOUS
  Filled 2017-07-18 (×7): qty 4

## 2017-07-18 MED ORDER — LIP MEDEX EX OINT
1.0000 "application " | TOPICAL_OINTMENT | Freq: Two times a day (BID) | CUTANEOUS | Status: DC
  Administered 2017-07-18 – 2017-07-26 (×13): 1 via TOPICAL
  Filled 2017-07-18 (×3): qty 7

## 2017-07-18 MED ORDER — BISACODYL 10 MG RE SUPP: 10.0000 mg | Freq: Two times a day (BID) | RECTAL | Status: DC | PRN

## 2017-07-18 MED ORDER — MENTHOL 3 MG MT LOZG: 1.0000 | LOZENGE | OROMUCOSAL | Status: DC | PRN

## 2017-07-18 MED ORDER — SIMETHICONE 40 MG/0.6ML PO SUSP
40.0000 mg | Freq: Four times a day (QID) | ORAL | Status: DC | PRN
  Filled 2017-07-18: qty 0.6

## 2017-07-18 MED ORDER — HYDROCORTISONE 2.5 % RE CREA
1.0000 "application " | TOPICAL_CREAM | Freq: Four times a day (QID) | RECTAL | Status: DC | PRN
  Filled 2017-07-18: qty 28.35

## 2017-07-18 MED ORDER — HYDRALAZINE HCL 20 MG/ML IJ SOLN
5.0000 mg | INTRAMUSCULAR | Status: DC | PRN
  Administered 2017-07-20: 5 mg via INTRAVENOUS
  Filled 2017-07-18: qty 1

## 2017-07-18 MED ORDER — METHOCARBAMOL 1000 MG/10ML IJ SOLN
1000.0000 mg | Freq: Four times a day (QID) | INTRAMUSCULAR | Status: DC | PRN
Start: 2017-07-18 — End: 2017-07-20
  Filled 2017-07-18: qty 10

## 2017-07-18 MED ORDER — ALUM & MAG HYDROXIDE-SIMETH 200-200-20 MG/5ML PO SUSP: 30.0000 mL | Freq: Four times a day (QID) | ORAL | Status: DC | PRN

## 2017-07-18 MED ORDER — GUAIFENESIN-DM 100-10 MG/5ML PO SYRP: 10.0000 mL | ORAL_SOLUTION | ORAL | Status: DC | PRN

## 2017-07-18 MED ORDER — METOPROLOL TARTRATE 5 MG/5ML IV SOLN
5.0000 mg | Freq: Four times a day (QID) | INTRAVENOUS | Status: DC | PRN
  Administered 2017-07-19 – 2017-07-21 (×4): 5 mg via INTRAVENOUS
  Filled 2017-07-18 (×4): qty 5

## 2017-07-18 MED ORDER — SODIUM CHLORIDE 0.9% FLUSH: 3.0000 mL | INTRAVENOUS | Status: DC | PRN

## 2017-07-18 MED ORDER — MORPHINE SULFATE (PF) 2 MG/ML IV SOLN
2.0000 mg | INTRAVENOUS | Status: DC | PRN
  Administered 2017-07-20: 2 mg via INTRAVENOUS
  Filled 2017-07-18: qty 1

## 2017-07-18 MED ORDER — ACETAMINOPHEN 650 MG RE SUPP: 650.0000 mg | Freq: Four times a day (QID) | RECTAL | Status: DC | PRN

## 2017-07-18 MED ORDER — PSYLLIUM 95 % PO PACK
1.0000 | PACK | Freq: Every day | ORAL | Status: DC
  Administered 2017-07-19 – 2017-07-22 (×2): 1 via ORAL
  Filled 2017-07-18 (×2): qty 1

## 2017-07-18 MED ORDER — SODIUM CHLORIDE 0.9% FLUSH
3.0000 mL | Freq: Two times a day (BID) | INTRAVENOUS | Status: DC
  Administered 2017-07-18 – 2017-07-25 (×8): 3 mL via INTRAVENOUS

## 2017-07-18 MED ORDER — METOPROLOL TARTRATE 5 MG/5ML IV SOLN: 5.0000 mg | Freq: Four times a day (QID) | INTRAVENOUS | Status: DC | PRN

## 2017-07-18 MED ORDER — HYDROCORTISONE 1 % EX CREA: 1.0000 "application " | TOPICAL_CREAM | Freq: Three times a day (TID) | CUTANEOUS | Status: DC | PRN

## 2017-07-18 MED ORDER — ACETAMINOPHEN 325 MG PO TABS: 325.0000 mg | ORAL_TABLET | Freq: Four times a day (QID) | ORAL | Status: DC | PRN

## 2017-07-18 MED ORDER — PROCHLORPERAZINE EDISYLATE 5 MG/ML IJ SOLN
5.0000 mg | INTRAMUSCULAR | Status: DC | PRN
  Administered 2017-07-21: 10 mg via INTRAVENOUS
  Filled 2017-07-18 (×3): qty 2

## 2017-07-18 MED ORDER — PROCHLORPERAZINE EDISYLATE 5 MG/ML IJ SOLN
5.0000 mg | INTRAMUSCULAR | Status: DC | PRN
  Administered 2017-07-18 (×2): 10 mg via INTRAVENOUS
  Filled 2017-07-18 (×2): qty 2

## 2017-07-18 MED ORDER — METOCLOPRAMIDE HCL 5 MG/ML IJ SOLN: 5.0000 mg | Freq: Four times a day (QID) | INTRAMUSCULAR | Status: DC | PRN

## 2017-07-18 MED ORDER — METOCLOPRAMIDE HCL 5 MG/ML IJ SOLN
5.0000 mg | Freq: Four times a day (QID) | INTRAMUSCULAR | Status: DC | PRN
  Administered 2017-07-20: 10 mg via INTRAVENOUS
  Filled 2017-07-18: qty 2

## 2017-07-18 MED ORDER — PHENOL 1.4 % MT LIQD: 1.0000 | OROMUCOSAL | Status: DC | PRN

## 2017-07-18 NOTE — Progress Notes (Signed)
[facility] Progress Note    Subjective: Still nauseated and t the Compazine helps the most, but no vomiting.  Having gas and bowel movements.  Objective: Vital signs in last 24 hours: Temp:  [97.9 F (36.6 C)-98.3 F (36.8 C)] 98.3 F (36.8 C) (01/20 0502) Pulse Rate:  [110-138] 138 (01/20 0502) Resp:  [18] 18 (01/20 0502) BP: (120-153)/(86-90) 153/90 (01/20 0502) SpO2:  [94 %-98 %] 94 % (01/20 0502) Last BM Date: 07/17/17  Intake/Output from previous day: 01/19 0701 - 01/20 0700 In: 3159.6 [I.V.:3159.6] Out: 1800 [Emesis/NG output:1800] Intake/Output this shift: Total I/O In: 120 [P.O.:120] Out: -   General: No distress.   Lungs: Clear  Abd: Supraumbilical hernia is soft and easily reducible.  Not tender.  Nause is her biggets complaint.  Extremities: No changes.  No clinical signs or symptoms of DVT  Neuro: Intact  Lab Results:  @LABLAST2 (wbc:2,hgb:2,hct:2,plt:2) BMET ) Recent Labs    07/16/17 0342  NA 141  K 4.1  CL 105  CO2 29  GLUCOSE 132*  BUN 11  CREATININE 1.15*  CALCIUM 9.4   PT/INR No results for input(s): LABPROT, INR in the last 72 hours. ABG No results for input(s): PHART, HCO3 in the last 72 hours.  Invalid input(s): PCO2, PO2  Studies/Results: No results found.  Anti-infectives: Anti-infectives (From admission, onward)   Start     Dose/Rate Route Frequency Ordered Stop   07/13/17 1400  cefTRIAXone (ROCEPHIN) 2 g in dextrose 5 % 50 mL IVPB     2 g 100 mL/hr over 30 Minutes Intravenous Every 24 hours 07/13/17 1218 07/14/17 1538   07/12/17 1300  cefTRIAXone (ROCEPHIN) 1 g in dextrose 5 % 50 mL IVPB  Status:  Discontinued     1 g 100 mL/hr over 30 Minutes Intravenous Every 24 hours 07/12/17 1222 07/13/17 1218      Assessment/Plan: s/p  Advance diet  LOS: 5 days   Kathryne Eriksson. Dahlia Bailiff, MD, FACS [phone] [facility] Surgery 07/18/2017

## 2017-07-19 ENCOUNTER — Inpatient Hospital Stay (HOSPITAL_COMMUNITY): Payer: BLUE CROSS/BLUE SHIELD

## 2017-07-19 MED ORDER — LORAZEPAM 2 MG/ML IJ SOLN
0.5000 mg | Freq: Four times a day (QID) | INTRAMUSCULAR | Status: DC | PRN
  Administered 2017-07-19 – 2017-07-21 (×5): 0.5 mg via INTRAVENOUS
  Filled 2017-07-19 (×5): qty 1

## 2017-07-19 MED ORDER — DEXTROSE 5 % IV SOLN
3.0000 g | INTRAVENOUS | Status: DC
  Filled 2017-07-19: qty 3000

## 2017-07-19 NOTE — Progress Notes (Signed)
[facility] Progress Note     Subjective: CC: nausea and emesis Patient having nausea and had episode of bilious emesis this AM. Denies abdominal pain. Passing flatus and had a formed BM yesterday.   Objective: Vital signs in last 24 hours: Temp:  [97.6 F (36.4 C)-98.5 F (36.9 C)] 98.5 F (36.9 C) (01/21 0500) Pulse Rate:  [101-131] 112 (01/21 0500) Resp:  [16-185] 16 (01/21 0500) BP: (102-123)/(72-96) 123/84 (01/21 0500) SpO2:  [93 %-100 %] 93 % (01/21 0500) Last BM Date: 07/18/17  Intake/Output from previous day: 01/20 0701 - 01/21 0700 In: 874 [P.O.:480; I.V.:340; IV Piggyback:54] Out: 1800 [Emesis/NG output:1800] Intake/Output this shift: No intake/output data recorded.  PE: Gen:  Alert, NAD, pleasant Card:  Regular rate and rhythm, pedal pulses 2+ BL Pulm:  Normal effort, clear to auscultation bilaterally Abd: Soft, non-tender, non-distended, bowel sounds hypoactive, no HSM, reducible ventral hernia Skin: warm and dry, no rashes  Psych: A&Ox3   Lab Results:  No results for input(s): WBC, HGB, HCT, PLT in the last 72 hours. BMET No results for input(s): NA, K, CL, CO2, GLUCOSE, BUN, CREATININE, CALCIUM in the last 72 hours. PT/INR No results for input(s): LABPROT, INR in the last 72 hours. CMP     Component Value Date/Time   NA 141 07/16/2017 0342   NA 139 10/20/2016 1433   K 4.1 07/16/2017 0342   K 4.3 10/20/2016 1433   CL 105 07/16/2017 0342   CO2 29 07/16/2017 0342   CO2 26 10/20/2016 1433   GLUCOSE 132 (H) 07/16/2017 0342   GLUCOSE 101 10/20/2016 1433   BUN 11 07/16/2017 0342   BUN 13.4 10/20/2016 1433   CREATININE 1.15 (H) 07/16/2017 0342   CREATININE 0.9 10/20/2016 1433   CALCIUM 9.4 07/16/2017 0342   CALCIUM 10.0 10/20/2016 1433   PROT 8.4 (H) 07/12/2017 0549   PROT 7.9 10/20/2016 1433   ALBUMIN 4.5 07/12/2017 0549   ALBUMIN 4.0 10/20/2016 1433   AST 23 07/12/2017 0549   AST 18 10/20/2016 1433   ALT 26 07/12/2017 0549   ALT  14 10/20/2016 1433   ALKPHOS 96 07/12/2017 0549   ALKPHOS 102 10/20/2016 1433   BILITOT 1.1 07/12/2017 0549   BILITOT 0.30 10/20/2016 1433   GFRNONAA 57 (L) 07/16/2017 0342   GFRAA >60 07/16/2017 0342   Lipase     Component Value Date/Time   LIPASE 42 07/12/2017 0549       Studies/Results: Dg Abd 2 Views  Result Date: 07/19/2017 CLINICAL DATA:  Small bowel obstruction, symptomatically improved. EXAM: ABDOMEN - 2 VIEW COMPARISON:  Supine abdominal ultrasound of July 15, 2017. FINDINGS: There remain loops of mildly distended gas-filled small bowel in the upper abdomen. There is contrast within the colon and rectum which has migrated slowly distally from the appearance on the study 4 days ago. No extraluminal gas or contrast is observed. There is a moderate amount of gas and fluid within the stomach. IMPRESSION: Persistent partial mid to distal small bowel obstruction. No evidence of perforation. Electronically Signed   By: David  Martinique M.D.   On: 07/19/2017 08:51    Anti-infectives: Anti-infectives (From admission, onward)   Start     Dose/Rate Route Frequency Ordered Stop   07/13/17 1400  cefTRIAXone (ROCEPHIN) 2 g in dextrose 5 % 50 mL IVPB     2 g 100 mL/hr over 30 Minutes Intravenous Every 24 hours 07/13/17 1218 07/14/17 1538   07/12/17 1300  cefTRIAXone (ROCEPHIN) 1 g in dextrose  5 % 50 mL IVPB  Status:  Discontinued     1 g 100 mL/hr over 30 Minutes Intravenous Every 24 hours 07/12/17 1222 07/13/17 1218       Assessment/Plan Hx of CKD GERD Stage IIC (T4b, N0), adenocarcinoma the sigmoid colon - completed chemotherapy - in remission 09/2016, dr. Betsy Coder Morbid Obesity E.Coli UTI - had single dose abx, per primary service  SBO - KUB shows persistent SBO this AM  - NPO, prn antiemetics  - repeat BMET - will discuss with MD whether repeat CT may provide any benefit vs. Diagnostic laparoscopy   FEN: NPO, IVF VTE: SCDs, lovenox ID: [email or web address] single dose     LOS: 6 days    Brigid Re , [facility] Surgery 07/19/2017, 11:43 AM Pager: [phone] Mon-Fri 7:00 am-4:30 pm Sat-Sun 7:00 am-11:30 am

## 2017-07-19 NOTE — Progress Notes (Signed)
On call CCS MD notified of elevated pulse today (Upper 120's-140).  He was also notified of continuous vomiting.  MD gave several orders including place NG tube.  Day time RN reported 6 attempts to place NG at bedside (3 separate nurses) several days ago and patient required IR intervention. MD was notified of this issue.  Patient now has leaking IV and she is an extremely difficult stick.  Waiting on IV team (For stat IV placement) before Heart rate meds/IV bolus can be administered.  Will monitor.Amy Bentley

## 2017-07-19 NOTE — Progress Notes (Signed)
Patient now has pulse of 101 after receiving IV metoprolol.  She was also given benadryl, IV I L bolus initiated, and IVF restarted.  Patient reports feeling much better.  She reports not vomiting for 6 hours now. Will monitor.Amy Bentley

## 2017-07-19 NOTE — Progress Notes (Signed)
Patient just reported 2nd episode of emesis on night shift. Emesis output was around 600 cc's (yellow). Patient reports no longer feeling nauseated after vomiting.Roderick Pee

## 2017-07-20 ENCOUNTER — Inpatient Hospital Stay (HOSPITAL_COMMUNITY): Payer: BLUE CROSS/BLUE SHIELD | Admitting: Anesthesiology

## 2017-07-20 ENCOUNTER — Encounter (HOSPITAL_COMMUNITY): Payer: Self-pay | Admitting: Anesthesiology

## 2017-07-20 ENCOUNTER — Inpatient Hospital Stay (HOSPITAL_COMMUNITY): Payer: BLUE CROSS/BLUE SHIELD

## 2017-07-20 ENCOUNTER — Encounter (HOSPITAL_COMMUNITY): Admission: EM | Disposition: A | Discharge status: Home / Self Care | Payer: Self-pay | Source: Home / Self Care

## 2017-07-20 LAB — BASIC METABOLIC PANEL
Anion gap: 25 — ABNORMAL HIGH (ref 5–15)
Anion gap: 27 — ABNORMAL HIGH (ref 5–15)
Anion gap: 25 — ABNORMAL HIGH (ref 5–15)
BUN: 47 mg/dL — ABNORMAL HIGH (ref 6–20)
BUN: 51 mg/dL — ABNORMAL HIGH (ref 6–20)
BUN: 54 mg/dL — ABNORMAL HIGH (ref 6–20)
CO2: 32 mmol/L (ref 22–32)
CO2: 31 mmol/L (ref 22–32)
CO2: 32 mmol/L (ref 22–32)
Calcium: 10.8 mg/dL — ABNORMAL HIGH (ref 8.9–10.3)
Calcium: 10.9 mg/dL — ABNORMAL HIGH (ref 8.9–10.3)
Calcium: 10.4 mg/dL — ABNORMAL HIGH (ref 8.9–10.3)
Chloride: 76 mmol/L — ABNORMAL LOW (ref 101–111)
Chloride: 75 mmol/L — ABNORMAL LOW (ref 101–111)
Chloride: 74 mmol/L — ABNORMAL LOW (ref 101–111)
Creatinine, Ser: 8.99 mg/dL — ABNORMAL HIGH (ref 0.44–1.00)
Creatinine, Ser: 9.37 mg/dL — ABNORMAL HIGH (ref 0.44–1.00)
Creatinine, Ser: 10.07 mg/dL — ABNORMAL HIGH (ref 0.44–1.00)
GFR calc Af Amer: 5 mL/min — ABNORMAL LOW (ref >60)
GFR calc Af Amer: 5 mL/min — ABNORMAL LOW (ref >60)
GFR calc Af Amer: 5 mL/min — ABNORMAL LOW (ref >60)
GFR calc non Af Amer: 5 mL/min — ABNORMAL LOW (ref >60)
GFR calc non Af Amer: 5 mL/min — ABNORMAL LOW (ref >60)
GFR calc non Af Amer: 4 mL/min — ABNORMAL LOW (ref >60)
Glucose, Bld: 130 mg/dL — ABNORMAL HIGH (ref 65–99)
Glucose, Bld: 126 mg/dL — ABNORMAL HIGH (ref 65–99)
Glucose, Bld: 124 mg/dL — ABNORMAL HIGH (ref 65–99)
Potassium: 4.6 mmol/L (ref 3.5–5.1)
Potassium: 4.9 mmol/L (ref 3.5–5.1)
Potassium: 5.1 mmol/L (ref 3.5–5.1)
Sodium: 133 mmol/L — ABNORMAL LOW (ref 135–145)
Sodium: 133 mmol/L — ABNORMAL LOW (ref 135–145)
Sodium: 131 mmol/L — ABNORMAL LOW (ref 135–145)

## 2017-07-20 LAB — CBC
HCT: 49.2 % — ABNORMAL HIGH (ref 36.0–46.0)
Hemoglobin: 16.8 g/dL — ABNORMAL HIGH (ref 12.0–15.0)
MCH: 27.7 pg (ref 26.0–34.0)
MCHC: 34.1 g/dL (ref 30.0–36.0)
MCV: 81.2 fL (ref 78.0–100.0)
Platelets: 296 10*3/uL (ref 150–400)
RBC: 6.06 MIL/uL — ABNORMAL HIGH (ref 3.87–5.11)
RDW: 15.1 % (ref 11.5–15.5)
WBC: 17.8 10*3/uL — ABNORMAL HIGH (ref 4.0–10.5)

## 2017-07-20 LAB — MRSA PCR SCREENING: MRSA by PCR: NEGATIVE

## 2017-07-20 SURGERY — LAPAROSCOPY, DIAGNOSTIC
Anesthesia: General

## 2017-07-20 MED ORDER — SODIUM CHLORIDE 0.9 % IV SOLN
INTRAVENOUS | Status: DC
  Administered 2017-07-21: 1000 mL via INTRAVENOUS
  Administered 2017-07-21 – 2017-07-23 (×5): via INTRAVENOUS

## 2017-07-20 MED ORDER — HEPARIN SODIUM (PORCINE) 5000 UNIT/ML IJ SOLN
5000.0000 [IU] | Freq: Three times a day (TID) | INTRAMUSCULAR | Status: DC
  Administered 2017-07-20 – 2017-07-25 (×13): 5000 [IU] via SUBCUTANEOUS
  Filled 2017-07-20 (×14): qty 1

## 2017-07-20 MED ORDER — ENOXAPARIN SODIUM 40 MG/0.4ML ~~LOC~~ SOLN
40.0000 mg | Freq: Two times a day (BID) | SUBCUTANEOUS | Status: DC
Start: 2017-07-21 — End: 2017-07-20

## 2017-07-20 NOTE — Consult Note (Signed)
I have been asked to see the patient by Dr. Adonis Housekeeper, for evaluation and management of urinary retention.  History of present illness: 47 year old female with a history of colon cancer that in 07/2013 was resected.  The cancer was adherent to the bladder wall requiring a partial cystectomy as well.  She did up with a small bladder but had done well.  Today the patient is hospitalized for small bowel obstruction.  Over the course of the last several days she has become oliguric and her creatinine has risen.  The patient describes mild pelvic pressure and voiding frequent small amounts of urine.  Despite hydration the patient's renal function has worsened.  Because of her worsening renal function and in preparation for upcoming surgery a Foley catheter was requested.  The nursing staff had a difficult time and as such urology was consulted.  Simultaneously a urology nurse attempted to place a Foley catheter.  At the time of my arrival the Foley catheter was then, but there is no urine within the bag.  Review of systems: A 12 point comprehensive review of systems was obtained and is negative unless otherwise stated in the history of present illness.  Patient Active Problem List   Diagnosis Date Noted  . Incisional hernia, reducible 07/16/2017  . Nausea and vomiting 07/12/2017  . Malignant neoplasm of sigmoid colon ([place]) 03/13/2014  . Family history of malignant neoplasm of gastrointestinal tract 03/13/2014  . 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 Protein-calorie malnutrition, severe ([place]) 10/19/2013  . Iron deficiency anemia, multifactorial with GI/GU and menstrual losses contributory 10/18/2013  . Diverticulitis of colon (without mention of hemorrhage)(562.11) 10/18/2013  . Weight loss 10/17/2013  . Altered mental status 08/07/2013  . UTI (lower urinary tract infection) 08/07/2013  . Hypokalemia 08/07/2013  . Anemia 08/07/2013    No  current facility-administered medications on file prior to encounter.    Current Outpatient Medications on File Prior to Encounter  Medication Sig Dispense Refill  . ibuprofen (ADVIL,MOTRIN) 200 MG tablet Take 600 mg by mouth every 6 (six) hours as needed for mild pain.    Marland Kitchen ondansetron (ZOFRAN) 4 MG tablet Take 1 tablet (4 mg total) by mouth every 8 (eight) hours as needed for nausea or vomiting. 20 tablet 0    Past Medical History:  Diagnosis Date  . Anemia   . Chronic kidney disease    hx UTIs  . colon ca dx'd 09/2013  . GERD (gastroesophageal reflux disease)   . Hyperlipidemia     Past Surgical History:  Procedure Laterality Date  . BREAST BIOPSY Right    stereo   . CESAREAN SECTION     x 2  . COLON RESECTION N/A 10/20/2013   Procedure: OPEN SIGMOID COLONECTOMY COLOVESICAL FISTULA REPAIR;  Surgeon: Stark Klein, MD;  Location: [facility];  Service: General;  Laterality: N/A;  . COLONOSCOPY WITH PROPOFOL N/A 08/16/2014   Procedure: COLONOSCOPY WITH PROPOFOL;  Surgeon: Milus Banister, MD;  Location: [facility];  Service: Endoscopy;  Laterality: N/A;  . CYSTECTOMY N/A 10/20/2013   Procedure: CYSTECTOMY PARTIAL;  Surgeon: Ardis Hughs, MD;  Location: [facility];  Service: Urology;  Laterality: N/A;  . PORTACATH PLACEMENT N/A 11/15/2013   Procedure: INSERTION PORT-A-CATH;  Surgeon: Stark Klein, MD;  Location: [facility];  Service: General;  Laterality: N/A;  . TUBAL LIGATION      Social History   Tobacco Use  . Smoking status: Former Smoker  Packs/day: 0.50    Types: Cigarettes    Last attempt to quit: 10/17/2013    Years since quitting: 3.7  . Smokeless tobacco: Never Used  Substance Use Topics  . Alcohol use: No  . Drug use: No    Family History  Problem Relation Age of Onset  . Cancer Paternal Aunt 95       colon  . Colon cancer Paternal Aunt   . Hypertension Other   . Diabetes Other   . Esophageal cancer Neg Hx   . Rectal cancer Neg Hx   . Stomach cancer Neg Hx    . Breast cancer Neg Hx     PE: Vitals:   07/19/17 2230 07/20/17 0535 07/20/17 0554 07/20/17 1321  BP: 119/90 (!) 76/62 93/75 (!) 121/109  Pulse: (!) 117 (!) 127 (!) 124 (!) 115  Resp:  16 20 20   Temp:  97.8 F (36.6 C)  97.7 F (36.5 C)  TempSrc:  Oral  Oral  SpO2:  100% 98% 97%  Weight:      Height:       Patient appears to be in no acute distress  patient is alert and oriented x3, morbidly obese Atraumatic normocephalic head No cervical or supraclavicular lymphadenopathy appreciated No increased work of breathing, no audible wheezes/rhonchi Regular sinus rhythm/rate Abdomen distended and tender.  She has a midline ventral hernia. The patient's external genitalia is normal in appearance.  A Foley catheter is emanating from the patient's urethra.  I advanced the catheter several inches and then pulled the catheter back to the balloon was at the bladder neck. Lower extremities are symmetric without appreciable edema Grossly neurologically intact No identifiable skin lesions  Recent Labs    07/20/17 0405  WBC 17.8*  HGB 16.8*  HCT 49.2*   Recent Labs    07/20/17 0405 07/20/17 0811  NA 133* 133*  K 4.6 4.9  CL 76* 75*  CO2 32 31  GLUCOSE 130* 126*  BUN 47* 51*  CREATININE 8.99* 9.37*  CALCIUM 10.8* 10.9*   No results for input(s): LABPT, INR in the last 72 hours. No results for input(s): LABURIN in the last 72 hours. Results for orders placed or performed during the hospital encounter of 07/12/17  MRSA PCR Screening     Status: None   Collection Time: 07/20/17 12:03 AM  Result Value Ref Range Status   MRSA by PCR NEGATIVE NEGATIVE Final    Comment:        The GeneXpert MRSA Assay (FDA approved for NASAL specimens only), is one component of a comprehensive MRSA colonization surveillance program. It is not intended to diagnose MRSA infection nor to guide or monitor treatment for MRSA infections.     Imaging: None  Procedure: Using a catheter tip  syringe I injected or instilled 60 cc of normal saline into the patient's bladder which irrigated and flushed freely.  I proceed in and out several times.  The patient noted no significant pain.  The Foley catheter appeared to be in the appropriate location.  Imp: Despite the patient not making urine, her Foley catheter is in fact in her bladder.  Unfortunately, she has become oliguric.  Recommendations: Okay to remove the Foley catheter at any time as deemed appropriate by surgery and her managing medical team.  Thank you for involving me in this patient's care, please page with any further questions or concerns. Louis Meckel W

## 2017-07-20 NOTE — Progress Notes (Signed)
[facility] Progress Note  Day of Surgery  Subjective: CC: urinary retention Patient apparently has not had much urine output in the last 2-3 days. Occurrences documented but not actual amount. Patient reports she has been voiding small amounts of clear straw colored urine. Patient assumed had not had much urine due to being NPO. Denies abdominal pain. Some nausea, no further vomiting.   Objective: Vital signs in last 24 hours: Temp:  [97.8 F (36.6 C)-98.4 F (36.9 C)] 97.8 F (36.6 C) (01/22 0535) Pulse Rate:  [103-127] 124 (01/22 0554) Resp:  [16-20] 20 (01/22 0554) BP: (68-119)/(20-96) 93/75 (01/22 0554) SpO2:  [97 %-100 %] 98 % (01/22 0554) Last BM Date: 07/18/17  Intake/Output from previous day: 01/21 0701 - 01/22 0700 In: 2052.8 [I.V.:1890.8; IV Piggyback:162] Out: 805 [Urine:5; Emesis/NG output:800] Intake/Output this shift: Total I/O In: 0  Out: 900 [Emesis/NG output:900]  PE: Gen:  Alert, NAD, pleasant Card:  Regular rate and rhythm, pedal pulses 2+ BL Pulm:  Normal effort, clear to auscultation bilaterally Abd: Soft, non-tender, non-distended, bowel sounds hypoactive, no HSM, reducible ventral hernia Skin: warm and dry, no rashes  Psych: A&Ox3   Lab Results:  Recent Labs    07/20/17 0405  WBC 17.8*  HGB 16.8*  HCT 49.2*  PLT 296   BMET Recent Labs    07/20/17 0405 07/20/17 0811  NA 133* 133*  K 4.6 4.9  CL 76* 75*  CO2 32 31  GLUCOSE 130* 126*  BUN 47* 51*  CREATININE 8.99* 9.37*  CALCIUM 10.8* 10.9*   PT/INR No results for input(s): LABPROT, INR in the last 72 hours. CMP     Component Value Date/Time   NA 133 (L) 07/20/2017 0811   NA 139 10/20/2016 1433   K 4.9 07/20/2017 0811   K 4.3 10/20/2016 1433   CL 75 (L) 07/20/2017 0811   CO2 31 07/20/2017 0811   CO2 26 10/20/2016 1433   GLUCOSE 126 (H) 07/20/2017 0811   GLUCOSE 101 10/20/2016 1433   BUN 51 (H) 07/20/2017 0811   BUN 13.4 10/20/2016 1433   CREATININE 9.37 (H)  07/20/2017 0811   CREATININE 0.9 10/20/2016 1433   CALCIUM 10.9 (H) 07/20/2017 0811   CALCIUM 10.0 10/20/2016 1433   PROT 8.4 (H) 07/12/2017 0549   PROT 7.9 10/20/2016 1433   ALBUMIN 4.5 07/12/2017 0549   ALBUMIN 4.0 10/20/2016 1433   AST 23 07/12/2017 0549   AST 18 10/20/2016 1433   ALT 26 07/12/2017 0549   ALT 14 10/20/2016 1433   ALKPHOS 96 07/12/2017 0549   ALKPHOS 102 10/20/2016 1433   BILITOT 1.1 07/12/2017 0549   BILITOT 0.30 10/20/2016 1433   GFRNONAA 5 (L) 07/20/2017 0811   GFRAA 5 (L) 07/20/2017 0811   Lipase     Component Value Date/Time   LIPASE 42 07/12/2017 0549       Studies/Results: Dg Abd 2 Views  Result Date: 07/19/2017 CLINICAL DATA:  Small bowel obstruction, symptomatically improved. EXAM: ABDOMEN - 2 VIEW COMPARISON:  Supine abdominal ultrasound of July 15, 2017. FINDINGS: There remain loops of mildly distended gas-filled small bowel in the upper abdomen. There is contrast within the colon and rectum which has migrated slowly distally from the appearance on the study 4 days ago. No extraluminal gas or contrast is observed. There is a moderate amount of gas and fluid within the stomach. IMPRESSION: Persistent partial mid to distal small bowel obstruction. No evidence of perforation. Electronically Signed   By: Shanon Brow  Martinique M.D.   On: 07/19/2017 08:51    Anti-infectives: Anti-infectives (From admission, onward)   Start     Dose/Rate Route Frequency Ordered Stop   07/20/17 0600  ceFAZolin (ANCEF) 3 g in dextrose 5 % 50 mL IVPB  Status:  Discontinued     3 g 130 mL/hr over 30 Minutes Intravenous On call to O.R. 07/19/17 1404 07/20/17 0935   07/13/17 1400  cefTRIAXone (ROCEPHIN) 2 g in dextrose 5 % 50 mL IVPB     2 g 100 mL/hr over 30 Minutes Intravenous Every 24 hours 07/13/17 1218 07/14/17 1538   07/12/17 1300  cefTRIAXone (ROCEPHIN) 1 g in dextrose 5 % 50 mL IVPB  Status:  Discontinued     1 g 100 mL/hr over 30 Minutes Intravenous Every 24 hours  07/12/17 1222 07/13/17 1218       Assessment/Plan Hx of CKD GERD Stage IIC (T4b, N0), adenocarcinoma the sigmoid colon - completed chemotherapy - in remission 09/2016,dr. Betsy Coder Morbid Obesity E.Coli UTI - had single dose abx, per primary service  SBO - KUB shows persistent SBO yesterday  - NPO, prn antiemetics  - hold off on OR until sCr improves, possibly tomorrow  Bladder outlet obstruction - Cr 9+ this AM, patient has apparently only been urinating very small amounts at a time  - foley placement was attempted earlier this AM, RN to try again may use smaller catheter if needed or coudet - will consult urology - spoke with nephrology on the phone who feel that Cr should come back down with bladder drainage, can get a renal US if not improving  FEN: NPO, IVF VTE: SCDs, lovenox ID: [email or web address] single dose     LOS: 7 days    Brigid Re , [facility] Surgery 07/20/2017, 9:46 AM Pager: [phone] Consults: [phone] Mon-Fri 7:00 am-4:30 pm Sat-Sun 7:00 am-11:30 am

## 2017-07-20 NOTE — Consult Note (Signed)
Renal Service Consult Note [facility]  Amy Bentley 07/20/2017 Sol Blazing Requesting Physician:  Dr Amy Bentley  Reason for Consult:  Renal failure HPI: The patient is a 47 y.o. year-old with hx of obesity, anemia, colon Ca, HL who presented to ED on 1/14 with nausea and abd pain. Had UTI dx'd on 1/8 and rx'd with Keflex. CT showed ventral hernia w question of bowel obstruction.  Here creat was 1.0 on admission.  Repeat creatinines on 1/17 and 1/18 were stable.  Creat was repeated now today and was 8.9, repeated at 10.07.  Has not been urinating much at all.  RN couldn't get foley in , reportedly urology could and there was no sig urinary retention.  Asked to see for AKI.    Pt did receive 100 cc IV contrast on 1/14 with her CT abd.  No acei/ ARB/ nsaids, got 2 days IV rocephin which was stopped several days ago.  No PPI.  BP's have not been below 100.  Pt reports nausea and some vomiting, no SOB, no CP, no abd pain or leg swelling.  Denies hx of renal disease.   Old creat in 2014 were around 0.6, since 2017 creat baseline is around 1.0.     ROS  denies CP  no joint pain   no HA  no blurry vision  no rash  no diarrhea    Past Medical History  Past Medical History:  Diagnosis Date  . Anemia   . Chronic kidney disease    hx UTIs  . colon ca dx'd 09/2013  . GERD (gastroesophageal reflux disease)   . Hyperlipidemia    Past Surgical History  Past Surgical History:  Procedure Laterality Date  . BREAST BIOPSY Right    stereo   . CESAREAN SECTION     x 2  . COLON RESECTION N/A 10/20/2013   Procedure: OPEN SIGMOID COLONECTOMY COLOVESICAL FISTULA REPAIR;  Surgeon: Amy Klein, MD;  Location: [facility];  Service: General;  Laterality: N/A;  . COLONOSCOPY WITH PROPOFOL N/A 08/16/2014   Procedure: COLONOSCOPY WITH PROPOFOL;  Surgeon: Amy Banister, MD;  Location: [facility];  Service: Endoscopy;  Laterality: N/A;  . CYSTECTOMY N/A 10/20/2013   Procedure:  CYSTECTOMY PARTIAL;  Surgeon: Amy Hughs, MD;  Location: [facility];  Service: Urology;  Laterality: N/A;  . PORTACATH PLACEMENT N/A 11/15/2013   Procedure: INSERTION PORT-A-CATH;  Surgeon: Amy Klein, MD;  Location: [facility];  Service: General;  Laterality: N/A;  . TUBAL LIGATION     Family History  Family History  Problem Relation Age of Onset  . Cancer Paternal Aunt 61       colon  . Colon cancer Paternal Aunt   . Hypertension Other   . Diabetes Other   . Esophageal cancer Neg Hx   . Rectal cancer Neg Hx   . Stomach cancer Neg Hx   . Breast cancer Neg Hx    Social History  reports that she quit smoking about 3 years ago. Her smoking use included cigarettes. She smoked 0.50 packs per day. she has never used smokeless tobacco. She reports that she does not drink alcohol or use drugs. Allergies  Allergies  Allergen Reactions  . Bactrim [Sulfamethoxazole-Trimethoprim]     Causes disorientation, inability to speak   Home medications Prior to Admission medications   Medication Sig Start Date End Date Taking? Authorizing Provider  ibuprofen (ADVIL,MOTRIN) 200 MG tablet Take 600 mg by mouth every 6 (six) hours as  needed for mild pain.   Yes [provider]  ondansetron (ZOFRAN) 4 MG tablet Take 1 tablet (4 mg total) by mouth every 8 (eight) hours as needed for nausea or vomiting. 07/06/17  Yes Amy Gottron, NP   Liver Function Tests No results for input(s): AST, ALT, ALKPHOS, BILITOT, PROT, ALBUMIN in the last 168 hours. No results for input(s): LIPASE, AMYLASE in the last 168 hours. CBC Recent Labs  Lab 07/14/17 1005 07/15/17 0358 07/20/17 0405  WBC 8.5 7.4 17.8*  HGB 15.4* 14.7 16.8*  HCT 47.5* 45.8 49.2*  MCV 85.9 86.6 81.2  PLT 292 267 128   Basic Metabolic Panel Recent Labs  Lab 07/14/17 1005 07/15/17 0358 07/16/17 0342 07/20/17 0405 07/20/17 0811 07/20/17 1434  NA 143 144 141 133* 133* 131*  K 3.3* 4.4 4.1 4.6 4.9 5.1  CL 100* 108 105 76* 75*  74*  CO2 30 29 29  32 31 32  GLUCOSE 119* 136* 132* 130* 126* 124*  BUN 14 13 11  47* 51* 54*  CREATININE 1.25* 1.16* 1.15* 8.99* 9.37* 10.07*  CALCIUM 9.5 9.1 9.4 10.8* 10.9* 10.4*   Iron/TIBC/Ferritin/ %Sat    Component Value Date/Time   IRON 11 (L) 10/19/2013 1140   TIBC 254 10/19/2013 1140   FERRITIN 32 10/19/2013 1140   IRONPCTSAT 4 (L) 10/19/2013 1140    Vitals:   07/20/17 0535 07/20/17 0554 07/20/17 1321 07/20/17 1535  BP: (!) 76/62 93/75 (!) 121/109   Pulse: (!) 127 (!) 124 (!) 115   Resp: 16 20 20    Temp: 97.8 F (36.6 C)  97.7 F (36.5 C)   TempSrc: Oral  Oral   SpO2: 100% 98% 97%   Weight:    (!) 151.5 kg (334 lb)  Height:       Exam Gen alert, lyilng flat, no distress, pleasant, obese No rash, cyanosis or gangrene Sclera anicteric, throat clear and slightly dry  No jvd or bruits Chest clear bilat RRR no MRG Abd soft ntnd no mass or ascites +bs obese GU deferred MS no joint effusions or deformity  Ext no LE or UE edema / no wounds or ulcers Neuro is alert, Ox 3 , nf, no asterixis  UA 1/14 > 6-30 epis, 0-5 rbc/ wbc, 30 prot UA 1/16 > 0-5 wbc/ epi/ rbc, prot 30 WBC 17k  Hb 16.8  plt 296 Cumulative I/O = 11 L in and 11 L out (GI) Renal US - 9-10 cm kidneys, no hydro, nl bladder (done today)   Impression: 1. Acute renal failure - not making urine, no hx renal failure. IV contrast vs due to meds (AIN). Baseline creat 1.0- 1.1.  [place] not showing obstruction.  Supportive care, no indication for HD yet. Renal diet.  Will start IVF's, she may be dry.   2. Abd pain - suspected bowel obstruction 3. Hx colon Ca - in remission 4. Ventral hernia 5. EColi UTI - sp abx    Plan - urine lytes, IVF"s, foley cath, serial bladder scans, follow closely , may need HD if becomes uremic  Amy Splinter MD [place] pager [phone]   07/20/2017, 5:26 PM

## 2017-07-20 NOTE — Progress Notes (Signed)
Bladder scanned pt which showed several hundred cc of fluid. However pt is morbidly obese and has some anatomy changes from surgeries and from SBO. Tried to I&O cath but met resistance in urethra. Did get about 2cc in tubing which I collected in sample cup in case a specimen is requested. Will also see if MD wants to repeat labs to be sure they are correct. Charge Nurse updated. Hoyle Barr, RN

## 2017-07-20 NOTE — Progress Notes (Addendum)
Bladder scan showed 400cc, however after flushing foley only 100 is in the bag.  Paged urology and Dr. Louis Meckel believes that there likely isn't anything in the bladder.

## 2017-07-20 NOTE — Progress Notes (Signed)
When shift started at 1900 I placed a urine collection hat in the toilet due to pt on strict I&O and no measure device in room. Patient up to bathroom around 2100. There was a maybe 5cc of urine in hat. I assumed pt missed the hat. She said no, she urinated in hat and tech emptied it. Tech's charting stated 0 urine. This am pt has another 5cc in hat. I questioned her again due to lab work showing increases in BUN and Creat. Pt states for days she has only urinated a few drops a few times a day. Patient attributed that to no drinking therefore was no concerned about it. I explained she has IV fluids and should be making urine. Surgeon should be checking labs soon. If I do not hear from [facility] I will call them about the labs and small amt of urine output. Hoyle Barr, RN

## 2017-07-21 ENCOUNTER — Inpatient Hospital Stay (HOSPITAL_COMMUNITY): Payer: BLUE CROSS/BLUE SHIELD | Admitting: Anesthesiology

## 2017-07-21 ENCOUNTER — Encounter (HOSPITAL_COMMUNITY): Payer: Self-pay | Admitting: Anesthesiology

## 2017-07-21 ENCOUNTER — Encounter (HOSPITAL_COMMUNITY): Admission: EM | Disposition: A | Discharge status: Home / Self Care | Payer: Self-pay | Source: Home / Self Care

## 2017-07-21 DIAGNOSIS — Z9889 Other specified postprocedural states: Secondary | ICD-10-CM

## 2017-07-21 HISTORY — PX: VENTRAL HERNIA REPAIR: SHX424

## 2017-07-21 LAB — BASIC METABOLIC PANEL
Anion gap: 22 — ABNORMAL HIGH (ref 5–15)
BUN: 67 mg/dL — ABNORMAL HIGH (ref 6–20)
CO2: 25 mmol/L (ref 22–32)
Calcium: 7.6 mg/dL — ABNORMAL LOW (ref 8.9–10.3)
Chloride: 85 mmol/L — ABNORMAL LOW (ref 101–111)
Creatinine, Ser: 11.32 mg/dL — ABNORMAL HIGH (ref 0.44–1.00)
GFR calc Af Amer: 4 mL/min — ABNORMAL LOW (ref >60)
GFR calc non Af Amer: 4 mL/min — ABNORMAL LOW (ref >60)
Glucose, Bld: 119 mg/dL — ABNORMAL HIGH (ref 65–99)
Potassium: 4.8 mmol/L (ref 3.5–5.1)
Sodium: 132 mmol/L — ABNORMAL LOW (ref 135–145)

## 2017-07-21 LAB — CREATININE, URINE, RANDOM: Creatinine, Urine: 36.21 mg/dL

## 2017-07-21 LAB — RENAL FUNCTION PANEL
Albumin: 4.5 g/dL (ref 3.5–5.0)
Anion gap: 27 — ABNORMAL HIGH (ref 5–15)
BUN: 64 mg/dL — ABNORMAL HIGH (ref 6–20)
CO2: 31 mmol/L (ref 22–32)
Calcium: 9.9 mg/dL (ref 8.9–10.3)
Chloride: 73 mmol/L — ABNORMAL LOW (ref 101–111)
Creatinine, Ser: 11.59 mg/dL — ABNORMAL HIGH (ref 0.44–1.00)
GFR calc Af Amer: 4 mL/min — ABNORMAL LOW (ref >60)
GFR calc non Af Amer: 3 mL/min — ABNORMAL LOW (ref >60)
Glucose, Bld: 129 mg/dL — ABNORMAL HIGH (ref 65–99)
Phosphorus: 8.3 mg/dL — ABNORMAL HIGH (ref 2.5–4.6)
Potassium: 5.1 mmol/L (ref 3.5–5.1)
Sodium: 131 mmol/L — ABNORMAL LOW (ref 135–145)

## 2017-07-21 LAB — CBC
HCT: 47.6 % — ABNORMAL HIGH (ref 36.0–46.0)
Hemoglobin: 16.5 g/dL — ABNORMAL HIGH (ref 12.0–15.0)
MCH: 27.8 pg (ref 26.0–34.0)
MCHC: 34.7 g/dL (ref 30.0–36.0)
MCV: 80.3 fL (ref 78.0–100.0)
Platelets: 257 10*3/uL (ref 150–400)
RBC: 5.93 MIL/uL — ABNORMAL HIGH (ref 3.87–5.11)
RDW: 15.1 % (ref 11.5–15.5)
WBC: 16.9 10*3/uL — ABNORMAL HIGH (ref 4.0–10.5)

## 2017-07-21 LAB — SODIUM, URINE, RANDOM: Sodium, Ur: 54 mmol/L

## 2017-07-21 SURGERY — REPAIR, HERNIA, VENTRAL, LAPAROSCOPIC
Anesthesia: General | Site: Abdomen

## 2017-07-21 MED ORDER — HYDROMORPHONE HCL 1 MG/ML IJ SOLN
0.2500 mg | INTRAMUSCULAR | Status: DC | PRN
  Administered 2017-07-21: 0.5 mg via INTRAVENOUS
  Administered 2017-07-21: 0.25 mg via INTRAVENOUS
  Administered 2017-07-21 (×2): 0.5 mg via INTRAVENOUS
  Administered 2017-07-21: 0.25 mg via INTRAVENOUS

## 2017-07-21 MED ORDER — PROPOFOL 10 MG/ML IV BOLUS
INTRAVENOUS | Status: AC
  Filled 2017-07-21: qty 20

## 2017-07-21 MED ORDER — CISATRACURIUM BESYLATE (PF) 10 MG/5ML IV SOLN
INTRAVENOUS | Status: DC | PRN
  Administered 2017-07-21: 2 mg via INTRAVENOUS
  Administered 2017-07-21: 4 mg via INTRAVENOUS
  Administered 2017-07-21: 2 mg via INTRAVENOUS
  Administered 2017-07-21: 1 mg via INTRAVENOUS
  Administered 2017-07-21: 2 mg via INTRAVENOUS
  Administered 2017-07-21: 1 mg via INTRAVENOUS
  Administered 2017-07-21 (×2): 2 mg via INTRAVENOUS
  Administered 2017-07-21: 8 mg via INTRAVENOUS

## 2017-07-21 MED ORDER — LIDOCAINE 2% (20 MG/ML) 5 ML SYRINGE
INTRAMUSCULAR | Status: AC
  Filled 2017-07-21: qty 5

## 2017-07-21 MED ORDER — PROMETHAZINE HCL 25 MG/ML IJ SOLN: 6.2500 mg | INTRAMUSCULAR | Status: DC | PRN

## 2017-07-21 MED ORDER — DEXAMETHASONE SODIUM PHOSPHATE 10 MG/ML IJ SOLN
INTRAMUSCULAR | Status: DC | PRN
  Administered 2017-07-21: 10 mg via INTRAVENOUS

## 2017-07-21 MED ORDER — SUCCINYLCHOLINE CHLORIDE 200 MG/10ML IV SOSY
PREFILLED_SYRINGE | INTRAVENOUS | Status: DC | PRN
  Administered 2017-07-21: 140 mg via INTRAVENOUS

## 2017-07-21 MED ORDER — CISATRACURIUM BESYLATE 20 MG/10ML IV SOLN
INTRAVENOUS | Status: AC
  Filled 2017-07-21: qty 10

## 2017-07-21 MED ORDER — 0.9 % SODIUM CHLORIDE (POUR BTL) OPTIME
TOPICAL | Status: DC | PRN
  Administered 2017-07-21: 2000 mL

## 2017-07-21 MED ORDER — CEFAZOLIN SODIUM-DEXTROSE 2-4 GM/100ML-% IV SOLN: 2.0000 g | Freq: Once | INTRAVENOUS | Status: DC

## 2017-07-21 MED ORDER — MIDAZOLAM HCL 2 MG/2ML IJ SOLN
INTRAMUSCULAR | Status: AC
  Filled 2017-07-21: qty 2

## 2017-07-21 MED ORDER — BUPIVACAINE-EPINEPHRINE (PF) 0.5% -1:200000 IJ SOLN
INTRAMUSCULAR | Status: AC
  Filled 2017-07-21: qty 30

## 2017-07-21 MED ORDER — DEXAMETHASONE SODIUM PHOSPHATE 10 MG/ML IJ SOLN
INTRAMUSCULAR | Status: AC
  Filled 2017-07-21: qty 1

## 2017-07-21 MED ORDER — OXYCODONE HCL 5 MG PO TABS: 5.0000 mg | ORAL_TABLET | Freq: Once | ORAL | Status: DC | PRN

## 2017-07-21 MED ORDER — PHENYLEPHRINE 40 MCG/ML (10ML) SYRINGE FOR IV PUSH (FOR BLOOD PRESSURE SUPPORT)
PREFILLED_SYRINGE | INTRAVENOUS | Status: DC | PRN
  Administered 2017-07-21 (×4): 80 ug via INTRAVENOUS

## 2017-07-21 MED ORDER — SUCCINYLCHOLINE CHLORIDE 200 MG/10ML IV SOSY
PREFILLED_SYRINGE | INTRAVENOUS | Status: AC
  Filled 2017-07-21: qty 10

## 2017-07-21 MED ORDER — SODIUM CHLORIDE 0.9 % IV BOLUS (SEPSIS)
1000.0000 mL | Freq: Once | INTRAVENOUS | Status: AC
Start: 2017-07-21 — End: 2017-07-21
  Administered 2017-07-21: 1000 mL via INTRAVENOUS

## 2017-07-21 MED ORDER — ONDANSETRON HCL 4 MG/2ML IJ SOLN
INTRAMUSCULAR | Status: DC | PRN
  Administered 2017-07-21 (×2): 4 mg via INTRAVENOUS

## 2017-07-21 MED ORDER — MORPHINE SULFATE (PF) 4 MG/ML IV SOLN
2.0000 mg | INTRAVENOUS | Status: DC | PRN
  Administered 2017-07-21: 3 mg via INTRAVENOUS
  Administered 2017-07-21: 2 mg via INTRAVENOUS
  Administered 2017-07-22 (×2): 3 mg via INTRAVENOUS
  Administered 2017-07-22 – 2017-07-25 (×6): 2 mg via INTRAVENOUS
  Filled 2017-07-21 (×10): qty 1

## 2017-07-21 MED ORDER — ONDANSETRON HCL 4 MG/2ML IJ SOLN
INTRAMUSCULAR | Status: AC
  Filled 2017-07-21: qty 2

## 2017-07-21 MED ORDER — PROPOFOL 10 MG/ML IV BOLUS
INTRAVENOUS | Status: DC | PRN
  Administered 2017-07-21: 150 mg via INTRAVENOUS

## 2017-07-21 MED ORDER — FENTANYL CITRATE (PF) 100 MCG/2ML IJ SOLN
INTRAMUSCULAR | Status: DC | PRN
  Administered 2017-07-21: 50 ug via INTRAVENOUS
  Administered 2017-07-21: 100 ug via INTRAVENOUS
  Administered 2017-07-21 (×2): 50 ug via INTRAVENOUS

## 2017-07-21 MED ORDER — LIDOCAINE 2% (20 MG/ML) 5 ML SYRINGE
INTRAMUSCULAR | Status: DC | PRN
  Administered 2017-07-21: 100 mg via INTRAVENOUS

## 2017-07-21 MED ORDER — NEOSTIGMINE METHYLSULFATE 10 MG/10ML IV SOLN
INTRAVENOUS | Status: DC | PRN
  Administered 2017-07-21: 3 mg via INTRAVENOUS

## 2017-07-21 MED ORDER — FENTANYL CITRATE (PF) 250 MCG/5ML IJ SOLN
INTRAMUSCULAR | Status: AC
  Filled 2017-07-21: qty 5

## 2017-07-21 MED ORDER — GLYCOPYRROLATE 0.2 MG/ML IJ SOLN
INTRAMUSCULAR | Status: DC | PRN
  Administered 2017-07-21: 0.4 mg via INTRAVENOUS

## 2017-07-21 MED ORDER — SCOPOLAMINE 1 MG/3DAYS TD PT72
MEDICATED_PATCH | TRANSDERMAL | Status: DC | PRN
  Administered 2017-07-21: 1 via TRANSDERMAL

## 2017-07-21 MED ORDER — SCOPOLAMINE 1 MG/3DAYS TD PT72
MEDICATED_PATCH | TRANSDERMAL | Status: AC
  Filled 2017-07-21: qty 1

## 2017-07-21 MED ORDER — HYDROMORPHONE HCL 1 MG/ML IJ SOLN
INTRAMUSCULAR | Status: AC
  Administered 2017-07-21: 0.5 mg via INTRAVENOUS
  Filled 2017-07-21: qty 2

## 2017-07-21 MED ORDER — MIDAZOLAM HCL 5 MG/5ML IJ SOLN
INTRAMUSCULAR | Status: DC | PRN
  Administered 2017-07-21: 1 mg via INTRAVENOUS

## 2017-07-21 MED ORDER — OXYCODONE HCL 5 MG/5ML PO SOLN
5.0000 mg | Freq: Once | ORAL | Status: DC | PRN
  Filled 2017-07-21: qty 5

## 2017-07-21 SURGICAL SUPPLY — 63 items
ADH SKN CLS APL DERMABOND .7 (GAUZE/BANDAGES/DRESSINGS) ×1
APL SKNCLS STERI-STRIP NONHPOA (GAUZE/BANDAGES/DRESSINGS)
APPLIER CLIP 5 13 M/L LIGAMAX5 (MISCELLANEOUS)
APR CLP MED LRG 5 ANG JAW (MISCELLANEOUS)
BENZOIN TINCTURE PRP APPL 2/3 (GAUZE/BANDAGES/DRESSINGS) IMPLANT
BINDER ABDOMINAL 12 ML 46-62 (SOFTGOODS) IMPLANT
BLADE EXTENDED COATED 6.5IN (ELECTRODE) ×1 IMPLANT
CHLORAPREP W/TINT 26ML (MISCELLANEOUS) ×2 IMPLANT
CLIP APPLIE 5 13 M/L LIGAMAX5 (MISCELLANEOUS) IMPLANT
COVER SURGICAL LIGHT HANDLE (MISCELLANEOUS) ×2 IMPLANT
DECANTER SPIKE VIAL GLASS SM (MISCELLANEOUS) ×2 IMPLANT
DERMABOND ADVANCED (GAUZE/BANDAGES/DRESSINGS) ×1
DERMABOND ADVANCED .7 DNX12 (GAUZE/BANDAGES/DRESSINGS) IMPLANT
DEVICE SECURE STRAP 25 ABSORB (INSTRUMENTS) IMPLANT
DEVICE TROCAR PUNCTURE CLOSURE (ENDOMECHANICALS) ×1 IMPLANT
DISSECTOR BLUNT TIP ENDO 5MM (MISCELLANEOUS) IMPLANT
DRAIN CHANNEL 19F RND (DRAIN) ×2 IMPLANT
DRAPE WARM FLUID 44X44 (DRAPE) ×1 IMPLANT
DRSG OPSITE POSTOP 4X10 (GAUZE/BANDAGES/DRESSINGS) ×1 IMPLANT
DRSG OPSITE POSTOP 4X6 (GAUZE/BANDAGES/DRESSINGS) IMPLANT
DRSG OPSITE POSTOP 4X8 (GAUZE/BANDAGES/DRESSINGS) ×1 IMPLANT
ELECT PENCIL ROCKER SW 15FT (MISCELLANEOUS) ×1 IMPLANT
ELECT REM PT RETURN 15FT ADLT (MISCELLANEOUS) ×2 IMPLANT
EVACUATOR SILICONE 100CC (DRAIN) ×2 IMPLANT
GLOVE BIOGEL PI IND STRL 7.0 (GLOVE) ×1 IMPLANT
GLOVE BIOGEL PI IND STRL 7.5 (GLOVE) ×1 IMPLANT
GLOVE BIOGEL PI INDICATOR 7.0 (GLOVE) ×1
GLOVE BIOGEL PI INDICATOR 7.5 (GLOVE) ×1
GLOVE ECLIPSE 7.5 STRL STRAW (GLOVE) ×2 IMPLANT
GOWN STRL REUS W/TWL XL LVL3 (GOWN DISPOSABLE) ×6 IMPLANT
HANDLE SUCTION POOLE (INSTRUMENTS) IMPLANT
KIT BASIN OR (CUSTOM PROCEDURE TRAY) ×2 IMPLANT
MARKER SKIN DUAL TIP RULER LAB (MISCELLANEOUS) ×2 IMPLANT
MESH PHASIX RESORB RECT 20X25 (Mesh General) ×1 IMPLANT
NDL SPNL 22GX3.5 QUINCKE BK (NEEDLE) ×1 IMPLANT
NEEDLE SPNL 22GX3.5 QUINCKE BK (NEEDLE) ×2 IMPLANT
PAD POSITIONING PINK XL (MISCELLANEOUS) ×1 IMPLANT
PENCIL HANDSWITCHING (ELECTRODE) ×1 IMPLANT
RETAINER VISCERA MED (MISCELLANEOUS) ×1 IMPLANT
SCISSORS METZENBAUM CVD 33 (INSTRUMENTS) ×1 IMPLANT
SET IRRIG TUBING LAPAROSCOPIC (IRRIGATION / IRRIGATOR) IMPLANT
SHEARS HARMONIC ACE PLUS 36CM (ENDOMECHANICALS) IMPLANT
SLEEVE ADV FIXATION 5X100MM (TROCAR) IMPLANT
SPONGE LAP 18X18 X RAY DECT (DISPOSABLE) ×1 IMPLANT
STAPLER VISISTAT 35W (STAPLE) ×1 IMPLANT
STRIP CLOSURE SKIN 1/2X4 (GAUZE/BANDAGES/DRESSINGS) IMPLANT
SUCTION POOLE HANDLE (INSTRUMENTS) ×2
SUT ETHILON 2 0 PS N (SUTURE) ×2 IMPLANT
SUT MNCRL AB 4-0 PS2 18 (SUTURE) ×2 IMPLANT
SUT NOVA NAB GS-21 0 18 T12 DT (SUTURE) ×2 IMPLANT
SUT PDS AB 1 TP1 96 (SUTURE) ×2 IMPLANT
SUT PROLENE 0 CT 1 CR/8 (SUTURE) IMPLANT
SUT SILK 2 0SH CR/8 30 (SUTURE) ×1 IMPLANT
SUT VIC AB 0 CTX 27 (SUTURE) ×2 IMPLANT
SYR BULB IRRIGATION 50ML (SYRINGE) ×1 IMPLANT
SYSTEM WECK SHIELD CLOSURE (TROCAR) IMPLANT
TACKER 5MM HERNIA 3.5CML NAB (ENDOMECHANICALS) IMPLANT
TOWEL OR 17X26 10 PK STRL BLUE (TOWEL DISPOSABLE) ×2 IMPLANT
TRAY FOLEY W/METER SILVER 16FR (SET/KITS/TRAYS/PACK) IMPLANT
TRAY LAPAROSCOPIC (CUSTOM PROCEDURE TRAY) ×2 IMPLANT
TROCAR BLADELESS OPT 5 100 (ENDOMECHANICALS) ×1 IMPLANT
TROCAR XCEL NON-BLD 11X100MML (ENDOMECHANICALS) IMPLANT
TUBING INSUF HEATED (TUBING) ×2 IMPLANT

## 2017-07-21 NOTE — Anesthesia Preprocedure Evaluation (Addendum)
Anesthesia Evaluation  Patient identified by MRN, date of birth, ID band Patient awake    Reviewed: Allergy & Precautions, NPO status , Patient's Chart, lab work & pertinent test results  Airway Mallampati: III  TM Distance: >3 FB Neck ROM: Full    Dental  (+) Teeth Intact, Dental Advisory Given   Pulmonary former smoker,    Pulmonary exam normal        Cardiovascular negative cardio ROS Normal cardiovascular exam  ECG: ST   Neuro/Psych negative neurological ROS  negative psych ROS   GI/Hepatic Neg liver ROS, GERD  Controlled and Medicated,Hx colon ca   Endo/Other  Morbid obesity  Renal/GU CRFRenal diseasePre-op eval per Nephrology     Musculoskeletal HLD   Abdominal (+) + obese,   Peds  Hematology HLD   Anesthesia Other Findings Adhesions Ventral hernia Super obese SBO  Reproductive/Obstetrics                            Anesthesia Physical  Anesthesia Plan  ASA: IV  Anesthesia Plan: General   Post-op Pain Management:    Induction: Intravenous  PONV Risk Score and Plan: 4 or greater and Scopolamine patch - Pre-op, Midazolam, Dexamethasone, Ondansetron and Treatment may vary due to age or medical condition  Airway Management Planned: Oral ETT  Additional Equipment:   Intra-op Plan:   Post-operative Plan: Extubation in OR  Informed Consent: I have reviewed the patients History and Physical, chart, labs and discussed the procedure including the risks, benefits and alternatives for the proposed anesthesia with the patient or authorized representative who has indicated his/her understanding and acceptance.   Dental advisory given  Plan Discussed with: CRNA  Anesthesia Plan Comments:         Anesthesia Quick Evaluation

## 2017-07-21 NOTE — Discharge Instructions (Addendum)
[facility]      [facility] °[phone] ° °OPEN ABDOMINAL SURGERY: POST OP INSTRUCTIONS ° °Always review your discharge instruction sheet given to you by the facility where your surgery was performed. ° °IF YOU HAVE DISABILITY OR FAMILY LEAVE FORMS, YOU MUST BRING THEM TO THE OFFICE FOR PROCESSING.  PLEASE DO NOT GIVE THEM TO YOUR DOCTOR. ° °1. A prescription for pain medication may be given to you upon discharge.  Take your pain medication as prescribed, if needed.  If narcotic pain medicine is not needed, then you may take acetaminophen (Tylenol) or ibuprofen (Advil) as needed. °2. Take your usually prescribed medications unless otherwise directed. °3. If you need a refill on your pain medication, please contact your pharmacy. They will contact our office to request authorization.  Prescriptions will not be filled after 5pm or on week-ends. °4. You should follow a light diet the first few days after arrival home, such as soup and crackers, pudding, etc.unless your doctor has advised otherwise. A high-fiber, low fat diet can be resumed as tolerated.   Be sure to include lots of fluids daily. Most patients will experience some swelling and bruising on the chest and neck area.  Ice packs will help.  Swelling and bruising can take several days to resolve °5. Most patients will experience some swelling and bruising in the area of the incision. Ice pack will help. Swelling and bruising can take several days to resolve..  °6. It is common to experience some constipation if taking pain medication after surgery.  Increasing fluid intake and taking a stool softener will usually help or prevent this problem from occurring.  A mild laxative (Milk of Magnesia or Miralax) should be taken according to package directions if there are no bowel movements after 48 hours. °7.  You may have steri-strips (small skin tapes) in place directly over the incision.  These strips should be left on the skin for 7-10 days.  If your  surgeon used skin glue on the incision, you may shower in 24 hours.  The glue will flake off over the next 2-3 weeks.  Any sutures or staples will be removed at the office during your follow-up visit. You may find that a light gauze bandage over your incision may keep your staples from being rubbed or pulled. You may shower and replace the bandage daily. °8. ACTIVITIES:  You may resume regular (light) daily activities beginning the next day--such as daily self-care, walking, climbing stairs--gradually increasing activities as tolerated.  You may have sexual intercourse when it is comfortable.  Refrain from any heavy lifting or straining until approved by your doctor. °a. You may drive when you no longer are taking prescription pain medication, you can comfortably wear a seatbelt, and you can safely maneuver your car and apply brakes °b. Return to Work: ___________________________________ °9. You should see your doctor in the office for a follow-up appointment approximately two weeks after your surgery.  Make sure that you call for this appointment within a day or two after you arrive home to insure a convenient appointment time. °OTHER INSTRUCTIONS:  °_____________________________________________________________ °_____________________________________________________________ ° °WHEN TO CALL YOUR DOCTOR: °1. Fever over 101.0 °2. Inability to urinate °3. Nausea and/or vomiting °4. Extreme swelling or bruising °5. Continued bleeding from incision. °6. Increased pain, redness, or drainage from the incision. °7. Difficulty swallowing or breathing °8. Muscle cramping or spasms. °9. Numbness or tingling in hands or feet or around lips. ° °The clinic staff is available to   answer your questions during regular business hours.  Please dont hesitate to call and ask to speak to one of the nurses if you have concerns.  For further questions, please visit [email or web address]    [facility] Care Surgical  drains are used to remove extra fluid that normally builds up in a surgical wound after surgery. A surgical drain helps to heal a surgical wound. Different kinds of surgical drains include:  Active drains. These drains use suction to pull drainage away from the surgical wound. Drainage flows through a tube to a container outside of the body. It is important to keep the bulb or the drainage container flat (compressed) at all times, except while you empty it. Flattening the bulb or container creates suction. The two most common types of active drains are bulb drains and Hemovac drains.  Passive drains. These drains allow fluid to drain naturally, by gravity. Drainage flows through a tube to a bandage (dressing) or a container outside of the body. Passive drains do not need to be emptied. The most common type of passive drain is the Penrose drain.  A drain is placed during surgery. Immediately after surgery, drainage is usually bright red and a little thicker than water. The drainage may gradually turn yellow or pink and become thinner. It is likely that your health care provider will remove the drain when the drainage stops or when the amount decreases to 1-2 Tbsp (15-30 mL) during a 24-hour period. How to care for your surgical drain  Keep the skin around the drain dry and covered with a dressing at all times.  Check your drain area every day for signs of infection. Check for: ? More redness, swelling, or pain. ? Pus or a bad smell. ? Cloudy drainage. Follow instructions from your health care provider about how to take care of your drain and how to change your dressing. Change your dressing at least one time every day. Change it more often if needed to keep the dressing dry. Make sure you: 1. Gather your supplies, including: ? Tape. ? Germ-free cleaning solution (sterile saline). ? Split gauze drain sponge: 4 x 4 inches (10 x 10 cm). ? Gauze square: 4 x 4 inches (10 x 10 cm). 2. Wash your hands  with soap and water before you change your dressing. If soap and water are not available, use hand sanitizer. 3. Remove the old dressing. Avoid using scissors to do that. 4. Use sterile saline to clean your skin around the drain. 5. Place the tube through the slit in a drain sponge. Place the drain sponge so that it covers your wound. 6. Place the gauze square or another drain sponge on top of the drain sponge that is on the wound. Make sure the tube is between those layers. 7. Tape the dressing to your skin. 8. If you have an active bulb or Hemovac drain, tape the drainage tube to your skin 1-2 inches (2.5-5 cm) below the place where the tube enters your body. Taping keeps the tube from pulling on any stitches (sutures) that you have. 9. Wash your hands with soap and water. 10. Write down the color of your drainage and how often you change your dressing.  How to empty your active bulb or Hemovac drain 1. Make sure that you have a measuring cup that you can empty your drainage into. 2. Wash your hands with soap and water. If soap and water are not available, use hand sanitizer. 3. Gently move your  fingers down the tube while squeezing very lightly. This is called stripping the tube. This clears any drainage, clots, or tissue from the tube. ? Do not pull on the tube. ? You may need to strip the tube several times every day to keep the tube clear. 4. Open the bulb cap or the drain plug. Do not touch the inside of the cap or the bottom of the plug. 5. Empty all of the drainage into the measuring cup. 6. Compress the bulb or the container and replace the cap or the plug. To compress the bulb or the container, squeeze it firmly in the middle while you close the cap or plug the container. 7. Write down the amount of drainage that you have in each 24-hour period. If you have less than 2 Tbsp (30 mL) of drainage during 24 hours, contact your health care provider. 8. Flush the drainage down the  toilet. 9. Wash your hands with soap and water. Contact a health care provider if:  You have more redness, swelling, or pain around your drain area.  The amount of drainage that you have is increasing instead of decreasing.  You have pus or a bad smell coming from your drain area.  You have a fever.  You have drainage that is cloudy.  There is a sudden stop or a sudden decrease in the amount of drainage that you have.  Your tube falls out.  Your active draindoes not stay compressedafter you empty it. This information is not intended to replace advice given to you by your health care provider. Make sure you discuss any questions you have with your health care provider. Document Released: 06/12/2000 Document Revised: 11/21/2015 Document Reviewed: 01/02/2015 Elsevier Interactive Patient Education  2018 [place] this sheet to all of your post-operative appointments while you have your drains.  Please measure your drains by CC's or ML's.  Make sure you drain and measure your JP Drains 2-3 times per day.  At the end of each day, add up totals    Date          ( 9 am ) ( 3 pm)   ( 9 pm ) Total

## 2017-07-21 NOTE — Anesthesia Procedure Notes (Signed)
Procedure Name: Intubation Date/Time: 07/21/2017 12:00 PM Performed by: Lavina Hamman, CRNA Pre-anesthesia Checklist: Patient identified, Emergency Drugs available, Suction available, Patient being monitored and Timeout performed Patient Re-evaluated:Patient Re-evaluated prior to induction Oxygen Delivery Method: Circle system utilized Preoxygenation: Pre-oxygenation with 100% oxygen Induction Type: IV induction, Rapid sequence and Cricoid Pressure applied Ventilation: Two handed mask ventilation required Laryngoscope Size: Mac and 4 Grade View: Grade I Tube type: Oral Tube size: 7.0 mm Number of attempts: 1 Airway Equipment and Method: Stylet Placement Confirmation: ETT inserted through vocal cords under direct vision,  positive ETCO2,  CO2 detector and breath sounds checked- equal and bilateral Secured at: 21 cm Tube secured with: Tape Dental Injury: Teeth and Oropharynx as per pre-operative assessment  Comments: Small amount of green bilious fluid noted upon intubation.  Suctioned quickly after ett placed and cuff inflated.  NG with 700cc output after placement.

## 2017-07-21 NOTE — Op Note (Signed)
Preoperative Diagnosis: Small bowel obstruction and ventral hernia  Postoprative Diagnosis: Same  Procedure: Procedure(s): EXPLORATORY LAPAROTOMY AND LYSIS OF ADHESIONS WITH  REPAIR OF VENTRAL HERNIA WITH MESH, retrorectus technique   Surgeon: Excell Seltzer T   Assistants: Margie Billet PA  Anesthesia:  General endotracheal anesthesia  Indications: Patient is a 47 year old female with a history of sigmoid colectomy for cancer.  She presented approximately 1 week ago with small bowel obstruction and a known ventral incisional hernia which was soft and appeared reducible.  She initially seemed to improve with conservative management but about 4 days ago had worsening with increased vomiting but minimal pain and tenderness.  We elected to proceed with surgical intervention but she was found to unfortunately be in acute renal failure about 48 hours ago and initially the surgery postponed to evaluate and treat this.  She continues however to have vomiting and abdominal distention and after consultation with renal service and discussion with the patient we elected to proceed today with exploratory laparotomy and repair of her ventral hernia.  CT scan previously had shown a transition point near the hernia mouth.    Procedure Detail: Patient was brought to the operating room, placed in the supine position on the operating table, and general endotracheal anesthesia induced.  Foley catheter was already in place.  She received preoperative IV antibiotics.  The abdomen was widely sterilely prepped and draped.  Patient timeout was performed and correct procedure verified.  I used a midline incision at the previous site overlying the ventral hernia.  Dissection was carried down into the subcutaneous tissue.  The hernia sac was encountered and was opened.  There were bandlike adhesions within the hernia sac.  We immediately noted significantly distended proximal small bowel and decompressed distal small  bowel.  The small bowel was dissected out of the hernia sac and there was a clear point of twisting and obstruction at the superior edge of the hernia sac.  These adhesions were taken down with relief of the obstruction.  Feeling through the larger hernia defect I could feel Swiss cheese type defect superiorly and inferiorly along the incision and the midline incision was lengthened with cautery through all these defects.  The small bowel was run from the ligament of Treitz to the ileocecal valve.  There were several further bandlike adhesions and areas of kinking within the abdominal cavity but none appear to be causing obstruction.  All adhesions were completely lysed until the small bowel was completely free.  NG had been placed after start of the surgery and approximately 3-1/2 L of fluid was removed.  I did milk contents of the distended proximal bowel back into the stomach which was decompressed.  The small bowel was returned to its anatomic position.  I elected to repair the hernia with a retrorectus technique using mesh.  The entire previous midline incision had been opened and again there was a large central defect and smaller defects superiorly and inferiorly along the incision.  On either side the retrorectus space was developed incising the posterior sheath and bluntly dissecting behind the rectus muscle out lateral to the perforating vessels.  After completing the lateral dissection the peritoneum was dissected off the midline superiorly and inferiorly to allow at least a 5 or 6 cm overlap.  The peritoneum and posterior rectus sheath was then closed along the midline with running 0 Vicryl without undue tension.  A piece of Phasix mesh was trimmed to size to fit the preperitoneal space which measured  25 x 15 cm.  This was fixed in place with interrupted 0 Prolene brought out through the anterior abdominal wall in 8 locations using 0 Novafil with nice broad deployment of the mesh in all directions.  The  space was thoroughly irrigated and hemostasis assured.  There was nice broad taut deployment of mesh in all directions.  The midline fascia was then debrided and mobilized a short distance to allow healthy fashion to be closed along the midline.  A 19 Blake closed drain was left on top of the mesh and beneath the fascia and brought out through a separate stab wound.  The midline fascia was closed using running looped #1 PDS begun at either end of the incision and tied centrally.  Subtenons tissue was irrigated and skin closed using staples.  The umbilical and periumbilical skin was very thinned due to the hernia and stretched and I was concerned about blood supply and this was excised prior to closure.  Dry sterile dressings were applied.  Sponge needle and instrument counts were correct.    Findings: Above  Estimated Blood Loss:  less than 100 mL         Drains: 19 round Blake and retrorectus space  Blood Given: none          Specimens: None        Complications:  * No complications entered in OR log *         Disposition: PACU - hemodynamically stable.         Condition: stable

## 2017-07-21 NOTE — Progress Notes (Addendum)
Patient ID: Amy Bentley, female   DOB: [birth year], 47 y.o.   MRN: [id number] 1 Day Post-Op   Subjective: Had one further episode of fairly large volume bilious/feculent vomiting early this morning.  No complaints currently.  Has not really had any abdominal pain.  Scant urine output as below.  Objective: Vital signs in last 24 hours: Temp:  [97.5 F (36.4 C)-98.4 F (36.9 C)] 98.4 F (36.9 C) (01/23 0457) Pulse Rate:  [104-133] 104 (01/23 0547) Resp:  [18-28] 18 (01/23 0457) BP: (119-121)/(75-109) 120/75 (01/23 0457) SpO2:  [95 %-97 %] 95 % (01/23 0457) Weight:  [151.5 kg (334 lb)] 151.5 kg (334 lb) (01/22 1535) Last BM Date: 07/19/17  Intake/Output from previous day: 01/22 0701 - 01/23 0700 In: 0  Out: 2000 [Urine:100; Emesis/NG output:1900] Intake/Output this shift: Total I/O In: 2637.5 [I.V.:2637.5] Out: -   General appearance: alert, cooperative, no distress and morbidly obese Resp: clear to auscultation bilaterally GI: Soft and nontender.  Difficult to assess distention due to obesity.  Hernia is reducible and mildly tender. Extremities: edema Trace  Lab Results:  Recent Labs    07/20/17 0405 07/21/17 0341  WBC 17.8* 16.9*  HGB 16.8* 16.5*  HCT 49.2* 47.6*  PLT 296 257   BMET Recent Labs    07/20/17 1434 07/21/17 0341  NA 131* 131*  K 5.1 5.1  CL 74* 73*  CO2 32 31  GLUCOSE 124* 129*  BUN 54* 64*  CREATININE 10.07* 11.59*  CALCIUM 10.4* 9.9     Studies/Results: US Renal  Result Date: 07/20/2017 CLINICAL DATA:  Anemia. EXAM: RENAL / URINARY TRACT ULTRASOUND COMPLETE COMPARISON:  07/19/2017. FINDINGS: Right Kidney: Length: 9.1 cm. Echogenicity within normal limits. No mass or hydronephrosis visualized. Left Kidney: Length: 9.4 cm. Echogenicity within normal limits. No mass or hydronephrosis visualized. Bladder: Appears normal for degree of bladder distention. IMPRESSION: No acute abnormality identified. Electronically Signed   By: Marcello Moores  Register    On: 07/20/2017 15:10    Anti-infectives: Anti-infectives (From admission, onward)   Start     Dose/Rate Route Frequency Ordered Stop   07/20/17 0600  ceFAZolin (ANCEF) 3 g in dextrose 5 % 50 mL IVPB  Status:  Discontinued     3 g 130 mL/hr over 30 Minutes Intravenous On call to O.R. 07/19/17 1404 07/20/17 0935   07/13/17 1400  cefTRIAXone (ROCEPHIN) 2 g in dextrose 5 % 50 mL IVPB     2 g 100 mL/hr over 30 Minutes Intravenous Every 24 hours 07/13/17 1218 07/14/17 1538   07/12/17 1300  cefTRIAXone (ROCEPHIN) 1 g in dextrose 5 % 50 mL IVPB  Status:  Discontinued     1 g 100 mL/hr over 30 Minutes Intravenous Every 24 hours 07/12/17 1222 07/13/17 1218      Assessment/Plan: Small bowel obstruction.  At the time of planned laparotomy tomorrow was found to be in acute renal failure.  Has had fluid boluses and IVs at 150 an hour with scant urine output.  BUN and creatinine increased as above. I am going to replace her NG tube.  I believe she will need a laparotomy in the near future although exam would not indicate any bowel compromise.  I am going to discuss timing of her surgery with Dr. Jonnie Finner due to her ARF and worsening renal function and possible need for dialysis.  All this discussed with the patient and questions answered.  I discussed timing of surgery with Dr. Jonnie Finner.  He feels that proceeding with laparotomy  at any time should not affect the course of her renal disease as long as we keep her well-hydrated and not hypotensive.  She obviously has ongoing obstruction with bilious vomiting and requires laparotomy.  I therefore plan to proceed with exploratory laparotomy, lysis of adhesions and repair of her incisional hernia today.  This was discussed with the patient again including the nature of surgery and indications and timing related to her renal disease.  All questions answered and she agrees to proceed.    LOS: 8 days    Edward Jolly 07/21/2017

## 2017-07-21 NOTE — Progress Notes (Signed)
[facility] Progress Note  Subjective: no new c/o's, vomited 1000 cc.  Getting NS bolus. Creat up 11, no confusion or jerking  Vitals:   07/20/17 2223 07/21/17 0000 07/21/17 0457 07/21/17 0547  BP: 119/80  120/75   Pulse: (!) 133 (!) 110 (!) 115 (!) 104  Resp: (!) 28  18   Temp: (!) 97.5 F (36.4 C)  98.4 F (36.9 C)   TempSrc: Oral  Oral   SpO2: 96%  95%   Weight:      Height:        Inpatient medications: . feeding supplement  237 mL Oral BID BM  . heparin injection (subcutaneous)  5,000 Units Subcutaneous Q8H  . lip balm  1 application Topical BID  . psyllium  1 packet Oral Daily  . saccharomyces boulardii  250 mg Oral BID  . sodium chloride flush  3 mL Intravenous Q12H   . sodium chloride    . sodium chloride 150 mL/hr at 07/21/17 0759  .  ceFAZolin (ANCEF) IV    . lactated ringers Stopped (07/21/17 0758)  . ondansetron [facility]) IV Stopped (07/20/17 2052)  . sodium chloride     sodium chloride, acetaminophen, acetaminophen, alum & mag hydroxide-simeth, bisacodyl, diphenhydrAMINE **OR** diphenhydrAMINE, guaiFENesin-dextromethorphan, hydrALAZINE, hydrocortisone, hydrocortisone cream, LORazepam, magic mouthwash, menthol-cetylpyridinium, methocarbamol, metoCLOPramide (REGLAN) injection, metoprolol tartrate, morphine injection, ondansetron (ZOFRAN) IV, ondansetron **OR** [DISCONTINUED] ondansetron (ZOFRAN) IV, phenol, prochlorperazine, prochlorperazine **OR** [DISCONTINUED] prochlorperazine, simethicone, sodium chloride flush, sodium chloride flush  Exam: Gen alert, lyilng flat, no distress, pleasant, obese No rash, cyanosis or gangrene Sclera anicteric, throat clear and slightly dry  No jvd or bruits Chest clear bilat RRR no MRG Abd soft ntnd no mass or ascites +bs obese GU deferred MS no joint effusions or deformity  Ext no LE or UE edema / no wounds or ulcers Neuro is alert, Ox 3 , nf, no asterixis  UA 1/14 > 6-30 epis, 0-5 rbc/ wbc, 30 prot UA 1/16  > 0-5 wbc/ epi/ rbc, prot 30 WBC 17k  Hb 16.8  plt 296 Cumulative I/O = 11 L in and 11 L out (GI) Renal US - 9-10 cm kidneys, no hydro, nl bladder (done today)   Impression: 1. Acute renal failure - not making urine, no hx renal failure. Prob dehydration , less likely contrast.  Made 100 cc after IVF"s, agree w/ vol resuscitation and IVF's as ordered.  No indication for HD yet.  OK for surg today. Will follow closely.   2. Abd pain - bowel obstruction 3. Hx colon Ca - in remission 4. Ventral hernia 5. EColi UTI - sp abx     Plan - as above   Kelly Splinter MD [facility] pager [phone]   07/21/2017, 10:06 AM   Recent Labs  Lab 07/20/17 0811 07/20/17 1434 07/21/17 0341  NA 133* 131* 131*  K 4.9 5.1 5.1  CL 75* 74* 73*  CO2 31 32 31  GLUCOSE 126* 124* 129*  BUN 51* 54* 64*  CREATININE 9.37* 10.07* 11.59*  CALCIUM 10.9* 10.4* 9.9  PHOS  --   --  8.3*   Recent Labs  Lab 07/21/17 0341  ALBUMIN 4.5   Recent Labs  Lab 07/15/17 0358 07/20/17 0405 07/21/17 0341  WBC 7.4 17.8* 16.9*  HGB 14.7 16.8* 16.5*  HCT 45.8 49.2* 47.6*  MCV 86.6 81.2 80.3  PLT 267 296 257   Iron/TIBC/Ferritin/ %Sat    Component Value Date/Time   IRON 11 (L) 10/19/2013 1140  TIBC 254 10/19/2013 1140   FERRITIN 32 10/19/2013 1140   IRONPCTSAT 4 (L) 10/19/2013 1140

## 2017-07-21 NOTE — Progress Notes (Signed)
Report called to [place] in short stay.

## 2017-07-21 NOTE — Progress Notes (Signed)
Pt vomited 1055mls of brown emesis last night

## 2017-07-21 NOTE — Transfer of Care (Signed)
Immediate Anesthesia Transfer of Care Note  Patient: Amy Bentley  Procedure(s) Performed: EXPLORATORY LAPAROTOMY AND LYSIS OF ADHESIONS WITH  REPAIR OF VENTRAL HERNIA WITH MESH  (N/A Abdomen)  Patient Location: PACU  Anesthesia Type:General  Level of Consciousness: awake, alert  and oriented  Airway & Oxygen Therapy: Patient Spontanous Breathing and Patient connected to face mask oxygen  Post-op Assessment: Report given to RN and Post -op Vital signs reviewed and stable  Post vital signs: Reviewed and stable  Last Vitals:  Vitals:   07/21/17 0457 07/21/17 0547  BP: 120/75   Pulse: (!) 115 (!) 104  Resp: 18   Temp: 36.9 C   SpO2: 95%     Last Pain:  Vitals:   07/21/17 0810  TempSrc:   PainSc: 0-No pain      Patients Stated Pain Goal: 0 (88/28/00 3491)  Complications: No apparent anesthesia complications

## 2017-07-22 ENCOUNTER — Encounter (HOSPITAL_COMMUNITY): Payer: Self-pay | Admitting: General Surgery

## 2017-07-22 LAB — RENAL FUNCTION PANEL
Albumin: 3.1 g/dL — ABNORMAL LOW (ref 3.5–5.0)
Anion gap: 17 — ABNORMAL HIGH (ref 5–15)
BUN: 66 mg/dL — ABNORMAL HIGH (ref 6–20)
CO2: 27 mmol/L (ref 22–32)
Calcium: 7.3 mg/dL — ABNORMAL LOW (ref 8.9–10.3)
Chloride: 91 mmol/L — ABNORMAL LOW (ref 101–111)
Creatinine, Ser: 9.19 mg/dL — ABNORMAL HIGH (ref 0.44–1.00)
GFR calc Af Amer: 5 mL/min — ABNORMAL LOW (ref >60)
GFR calc non Af Amer: 5 mL/min — ABNORMAL LOW (ref >60)
Glucose, Bld: 126 mg/dL — ABNORMAL HIGH (ref 65–99)
Phosphorus: 6.2 mg/dL — ABNORMAL HIGH (ref 2.5–4.6)
Potassium: 4.4 mmol/L (ref 3.5–5.1)
Sodium: 135 mmol/L (ref 135–145)

## 2017-07-22 LAB — CBC
HCT: 37.7 % (ref 36.0–46.0)
Hemoglobin: 12.8 g/dL (ref 12.0–15.0)
MCH: 27.6 pg (ref 26.0–34.0)
MCHC: 34 g/dL (ref 30.0–36.0)
MCV: 81.3 fL (ref 78.0–100.0)
Platelets: 199 10*3/uL (ref 150–400)
RBC: 4.64 MIL/uL (ref 3.87–5.11)
RDW: 15.5 % (ref 11.5–15.5)
WBC: 12.6 10*3/uL — ABNORMAL HIGH (ref 4.0–10.5)

## 2017-07-22 MED ORDER — ACETAMINOPHEN 10 MG/ML IV SOLN
1000.0000 mg | Freq: Four times a day (QID) | INTRAVENOUS | Status: AC
  Administered 2017-07-22 – 2017-07-23 (×4): 1000 mg via INTRAVENOUS
  Filled 2017-07-22 (×5): qty 100

## 2017-07-22 MED ORDER — ALUM & MAG HYDROXIDE-SIMETH 200-200-20 MG/5ML PO SUSP
30.0000 mL | ORAL | Status: DC | PRN
  Administered 2017-07-22: 30 mL via ORAL
  Filled 2017-07-22: qty 30

## 2017-07-22 MED ORDER — HYDROMORPHONE HCL 1 MG/ML IJ SOLN
0.5000 mg | INTRAMUSCULAR | Status: DC | PRN
  Administered 2017-07-24: 1 mg via INTRAVENOUS
  Filled 2017-07-22: qty 1

## 2017-07-22 NOTE — Progress Notes (Signed)
[facility] Progress Note  Subjective: 1000 cc UOP yest , creat down to 9 today, pt doing well.  Had surg yesterday and is eating ice chips.    Vitals:   07/22/17 0800 07/22/17 1000 07/22/17 1130 07/22/17 1200  BP: 138/85 136/82  (!) 86/63  Pulse: (!) 110 (!) 106  (!) 126  Resp: 16 18  19   Temp:   98.2 F (36.8 C)   TempSrc:   Oral   SpO2: 96% 99%  96%  Weight:      Height:        Inpatient medications: . feeding supplement  237 mL Oral BID BM  . heparin injection (subcutaneous)  5,000 Units Subcutaneous Q8H  . lip balm  1 application Topical BID  . psyllium  1 packet Oral Daily  . saccharomyces boulardii  250 mg Oral BID  . sodium chloride flush  3 mL Intravenous Q12H   . sodium chloride    . sodium chloride 150 mL/hr at 07/22/17 1104  .  ceFAZolin (ANCEF) IV    . lactated ringers Stopped (07/21/17 1212)  . ondansetron [facility]) IV Stopped (07/20/17 2052)   sodium chloride, acetaminophen, acetaminophen, alum & mag hydroxide-simeth, bisacodyl, diphenhydrAMINE **OR** diphenhydrAMINE, guaiFENesin-dextromethorphan, hydrALAZINE, hydrocortisone, hydrocortisone cream, LORazepam, magic mouthwash, menthol-cetylpyridinium, methocarbamol, metoCLOPramide (REGLAN) injection, metoprolol tartrate, morphine injection, ondansetron (ZOFRAN) IV, ondansetron **OR** [DISCONTINUED] ondansetron (ZOFRAN) IV, phenol, prochlorperazine, prochlorperazine **OR** [DISCONTINUED] prochlorperazine, simethicone, sodium chloride flush, sodium chloride flush  Exam: Gen alert, lyilng flat, no distress No jvd or bruits Chest clear bilat RRR no MRG Abd soft ntnd obese Ext no LE or UE edema Neuro is alert, Ox 3 , nf, no asterixis  UA 1/14 > 6-30 epis, 0-5 rbc/ wbc, 30 prot UA 1/16 > 0-5 wbc/ epi/ rbc, prot 30 WBC 17k  Hb 16.8  plt 296 Cumulative I/O = 11 L in and 11 L out (GI) Renal US - 9-10 cm kidneys, no hydro, nl bladder (done today)   Impression: 1. Acute renal failure - suspect  from vol depletion. Post op now and getting lots of IVF's, UOP has improved and creatinine down to 9 today. Should cont to improve.  Cont IVF's.  2. Abd pain/ bowel obstruction - sp LOA 1/23 3. Hx colon Ca - in remission 4. Ventral hernia 5. EColi UTI - sp abx   Plan - as above   Kelly Splinter MD [facility] pager [phone]   07/22/2017, 2:22 PM   Recent Labs  Lab 07/21/17 0341 07/21/17 1539 07/22/17 0324  NA 131* 132* 135  K 5.1 4.8 4.4  CL 73* 85* 91*  CO2 31 25 27   GLUCOSE 129* 119* 126*  BUN 64* 67* 66*  CREATININE 11.59* 11.32* 9.19*  CALCIUM 9.9 7.6* 7.3*  PHOS 8.3*  --  6.2*   Recent Labs  Lab 07/21/17 0341 07/22/17 0324  ALBUMIN 4.5 3.1*   Recent Labs  Lab 07/20/17 0405 07/21/17 0341 07/22/17 0324  WBC 17.8* 16.9* 12.6*  HGB 16.8* 16.5* 12.8  HCT 49.2* 47.6* 37.7  MCV 81.2 80.3 81.3  PLT 296 257 199   Iron/TIBC/Ferritin/ %Sat    Component Value Date/Time   IRON 11 (L) 10/19/2013 1140   TIBC 254 10/19/2013 1140   FERRITIN 32 10/19/2013 1140   IRONPCTSAT 4 (L) 10/19/2013 1140

## 2017-07-22 NOTE — Anesthesia Postprocedure Evaluation (Signed)
Anesthesia Post Note  Patient: [name]  Procedure(s) Performed: EXPLORATORY LAPAROTOMY AND LYSIS OF ADHESIONS WITH  REPAIR OF VENTRAL HERNIA WITH MESH  (N/A Abdomen)     Patient location during evaluation: PACU Anesthesia Type: General Level of consciousness: awake and alert Pain management: pain level controlled Vital Signs Assessment: post-procedure vital signs reviewed and stable Respiratory status: spontaneous breathing, nonlabored ventilation, respiratory function stable and patient connected to nasal cannula oxygen Cardiovascular status: blood pressure returned to baseline and stable Postop Assessment: no apparent nausea or vomiting Anesthetic complications: no    Last Vitals:  Vitals:   07/22/17 0745 07/22/17 0800  BP:  138/85  Pulse:  (!) 110  Resp:  16  Temp: 36.4 C   SpO2:  96%    Last Pain:  Vitals:   07/22/17 0745  TempSrc: Oral  PainSc:                  Ryan P Ellender

## 2017-07-22 NOTE — Progress Notes (Signed)
[facility] Progress Note  1 Day Post-Op  Subjective: CC: No complaints Patient feels much better, looks the best I have seen her. Denies abdominal pain, nausea, vomiting. Patient states she has already gotten up and walked around some. Passing some flatus. Making urine, foley present. Mildly tachycardic.    Objective: Vital signs in last 24 hours: Temp:  [97.6 F (36.4 C)-98.8 F (37.1 C)] 97.6 F (36.4 C) (01/24 0745) Pulse Rate:  [103-118] 110 (01/24 0800) Resp:  [14-23] 16 (01/24 0800) BP: (120-148)/(66-104) 138/85 (01/24 0800) SpO2:  [94 %-100 %] 96 % (01/24 0800) Weight:  [136 kg (299 lb 13.2 oz)-140.6 kg (309 lb 15.5 oz)] 140.6 kg (309 lb 15.5 oz) (01/24 0414) Last BM Date: 07/19/17  Intake/Output from previous day: 01/23 0701 - 01/24 0700 In: 6737.5 [I.V.:6737.5] Out: 4245 [Urine:1015; Emesis/NG output:60; Drains:120; Blood:150] Intake/Output this shift: Total I/O In: 300 [I.V.:300] Out: -   PE: Gen:  Alert, NAD, pleasant Card:  Regular rate and rhythm, pedal pulses 2+ BL Pulm:  Normal effort, clear to auscultation bilaterally Abd: Soft, non-tender, non-distended, bowel sounds hypoactive, no HSM, incision C/D/I, drain present with serosanguinous output GU: foley present with amber urine in bag Skin: warm and dry, no rashes  Psych: A&Ox3   Lab Results:  Recent Labs    07/21/17 0341 07/22/17 0324  WBC 16.9* 12.6*  HGB 16.5* 12.8  HCT 47.6* 37.7  PLT 257 199   BMET Recent Labs    07/21/17 1539 07/22/17 0324  NA 132* 135  K 4.8 4.4  CL 85* 91*  CO2 25 27  GLUCOSE 119* 126*  BUN 67* 66*  CREATININE 11.32* 9.19*  CALCIUM 7.6* 7.3*   PT/INR No results for input(s): LABPROT, INR in the last 72 hours. CMP     Component Value Date/Time   NA 135 07/22/2017 0324   NA 139 10/20/2016 1433   K 4.4 07/22/2017 0324   K 4.3 10/20/2016 1433   CL 91 (L) 07/22/2017 0324   CO2 27 07/22/2017 0324   CO2 26 10/20/2016 1433   GLUCOSE 126 (H)  07/22/2017 0324   GLUCOSE 101 10/20/2016 1433   BUN 66 (H) 07/22/2017 0324   BUN 13.4 10/20/2016 1433   CREATININE 9.19 (H) 07/22/2017 0324   CREATININE 0.9 10/20/2016 1433   CALCIUM 7.3 (L) 07/22/2017 0324   CALCIUM 10.0 10/20/2016 1433   PROT 8.4 (H) 07/12/2017 0549   PROT 7.9 10/20/2016 1433   ALBUMIN 3.1 (L) 07/22/2017 0324   ALBUMIN 4.0 10/20/2016 1433   AST 23 07/12/2017 0549   AST 18 10/20/2016 1433   ALT 26 07/12/2017 0549   ALT 14 10/20/2016 1433   ALKPHOS 96 07/12/2017 0549   ALKPHOS 102 10/20/2016 1433   BILITOT 1.1 07/12/2017 0549   BILITOT 0.30 10/20/2016 1433   GFRNONAA 5 (L) 07/22/2017 0324   GFRAA 5 (L) 07/22/2017 0324   Lipase     Component Value Date/Time   LIPASE 42 07/12/2017 0549       Studies/Results: US Renal  Result Date: 07/20/2017 CLINICAL DATA:  Anemia. EXAM: RENAL / URINARY TRACT ULTRASOUND COMPLETE COMPARISON:  07/19/2017. FINDINGS: Right Kidney: Length: 9.1 cm. Echogenicity within normal limits. No mass or hydronephrosis visualized. Left Kidney: Length: 9.4 cm. Echogenicity within normal limits. No mass or hydronephrosis visualized. Bladder: Appears normal for degree of bladder distention. IMPRESSION: No acute abnormality identified. Electronically Signed   By: Marcello Moores  Register   On: 07/20/2017 15:10    Anti-infectives: Anti-infectives (From  admission, onward)   Start     Dose/Rate Route Frequency Ordered Stop   07/21/17 1045  ceFAZolin (ANCEF) IVPB 2g/100 mL premix     2 g 200 mL/hr over 30 Minutes Intravenous  Once 07/21/17 0954     07/20/17 0600  ceFAZolin (ANCEF) 3 g in dextrose 5 % 50 mL IVPB  Status:  Discontinued     3 g 130 mL/hr over 30 Minutes Intravenous On call to O.R. 07/19/17 1404 07/20/17 0935   07/13/17 1400  cefTRIAXone (ROCEPHIN) 2 g in dextrose 5 % 50 mL IVPB     2 g 100 mL/hr over 30 Minutes Intravenous Every 24 hours 07/13/17 1218 07/14/17 1538   07/12/17 1300  cefTRIAXone (ROCEPHIN) 1 g in dextrose 5 % 50 mL IVPB   Status:  Discontinued     1 g 100 mL/hr over 30 Minutes Intravenous Every 24 hours 07/12/17 1222 07/13/17 1218       Assessment/Plan Hx of CKD GERD Stage IIC(T4b, N0), adenocarcinoma the sigmoid colon - completed chemotherapy - in remission 09/2016,dr. Betsy Coder Morbid Obesity E.Coli UTI - had single dose abx, per primary service  SBO S/p ex lap with LOA and repair of ventral hernia with mesh  - POD#1 - mobilize, IS - drain with 120 cc out - NGT with 60 cc/24h, and patient passing flatus - clamp NGT, may be able to pull this afternoon AKI/Acute renal failure - Cr 9.19 this AM, trending down, continue to follow - UOP improved - continue foley for now  FEN: allow CLD around NGT, continue IVF VTE: SCDs, SQ heparin  ID: rocephin 1/15; Ancef periop  Likely transfer out of ICU.   LOS: 9 days    Brigid Re , [facility] 07/22/2017, 9:58 AM Pager: [phone] Mon-Fri 7:00 am-4:30 pm Sat-Sun 7:00 am-11:30 am

## 2017-07-22 NOTE — Progress Notes (Signed)
Date: July 22, 2017 Brenen Beigel, BSN, RN3, CCM [phone] Chart and notes review for patient progress and needs. Will follow for case management and discharge needs. No cm or discharge needs present at time of this review. Next review date: [id number] 

## 2017-07-23 LAB — CBC
HCT: 32.4 % — ABNORMAL LOW (ref 36.0–46.0)
Hemoglobin: 10.4 g/dL — ABNORMAL LOW (ref 12.0–15.0)
MCH: 26.6 pg (ref 26.0–34.0)
MCHC: 32.1 g/dL (ref 30.0–36.0)
MCV: 82.9 fL (ref 78.0–100.0)
Platelets: 167 10*3/uL (ref 150–400)
RBC: 3.91 MIL/uL (ref 3.87–5.11)
RDW: 15.9 % — ABNORMAL HIGH (ref 11.5–15.5)
WBC: 10.4 10*3/uL (ref 4.0–10.5)

## 2017-07-23 LAB — RENAL FUNCTION PANEL
Albumin: 3 g/dL — ABNORMAL LOW (ref 3.5–5.0)
Anion gap: 12 (ref 5–15)
BUN: 48 mg/dL — ABNORMAL HIGH (ref 6–20)
CO2: 28 mmol/L (ref 22–32)
Calcium: 7.4 mg/dL — ABNORMAL LOW (ref 8.9–10.3)
Chloride: 100 mmol/L — ABNORMAL LOW (ref 101–111)
Creatinine, Ser: 4.11 mg/dL — ABNORMAL HIGH (ref 0.44–1.00)
GFR calc Af Amer: 14 mL/min — ABNORMAL LOW (ref >60)
GFR calc non Af Amer: 12 mL/min — ABNORMAL LOW (ref >60)
Glucose, Bld: 130 mg/dL — ABNORMAL HIGH (ref 65–99)
Phosphorus: 3.1 mg/dL (ref 2.5–4.6)
Potassium: 3.3 mmol/L — ABNORMAL LOW (ref 3.5–5.1)
Sodium: 140 mmol/L (ref 135–145)

## 2017-07-23 MED ORDER — POTASSIUM CHLORIDE CRYS ER 20 MEQ PO TBCR
20.0000 meq | EXTENDED_RELEASE_TABLET | Freq: Two times a day (BID) | ORAL | Status: DC
  Administered 2017-07-23 (×2): 20 meq via ORAL
  Filled 2017-07-23 (×2): qty 1

## 2017-07-23 MED ORDER — POTASSIUM CHLORIDE 10 MEQ/100ML IV SOLN
10.0000 meq | INTRAVENOUS | Status: AC
  Administered 2017-07-23 (×3): 10 meq via INTRAVENOUS
  Filled 2017-07-23 (×3): qty 100

## 2017-07-23 NOTE — Progress Notes (Signed)
[facility] Progress Note  Subjective: 2 L UOP yest, creat down to 4, doing much better  Vitals:   07/23/17 0600 07/23/17 0810 07/23/17 0847 07/23/17 1333  BP:   127/71 129/77  Pulse: (!) 103  (!) 110 (!) 102  Resp: (!) 21  20 18   Temp:  98.8 F (37.1 C)  99.1 F (37.3 C)  TempSrc:  Oral  Oral  SpO2: 96%  96% 97%  Weight:      Height:        Inpatient medications: . heparin injection (subcutaneous)  5,000 Units Subcutaneous Q8H  . lip balm  1 application Topical BID  . potassium chloride  20 mEq Oral BID  . sodium chloride flush  3 mL Intravenous Q12H   . sodium chloride    . sodium chloride 100 mL/hr at 07/23/17 1009  . acetaminophen Stopped (07/23/17 0606)  . ondansetron [facility]) IV Stopped (07/20/17 2052)   sodium chloride, alum & mag hydroxide-simeth, bisacodyl, diphenhydrAMINE **OR** diphenhydrAMINE, hydrALAZINE, hydrocortisone, hydrocortisone cream, HYDROmorphone (DILAUDID) injection, LORazepam, magic mouthwash, menthol-cetylpyridinium, metoCLOPramide (REGLAN) injection, metoprolol tartrate, morphine injection, ondansetron (ZOFRAN) IV, ondansetron **OR** [DISCONTINUED] ondansetron (ZOFRAN) IV, phenol, prochlorperazine, prochlorperazine **OR** [DISCONTINUED] prochlorperazine, sodium chloride flush, sodium chloride flush  Exam: Gen alert, lyilng flat, no distress No jvd or bruits Chest clear bilat RRR no MRG Abd soft ntnd obese Ext no LE or UE edema Neuro is alert, Ox 3 , nf,  Renal US - 9-10 cm kidneys, no hydro, nl bladder (done today)   Impression: 1. Acute renal failure - from vol depletion in setting of SBO. Post op now and creat down to 4, very good UOP now w vol resuscitation.  Should cont to improve.  Will sign off.  2. Abd pain/ bowel obstruction - sp LOA 1/23 3. Hx colon Ca - in remission 4. Ventral hernia 5. EColi UTI - sp abx   Plan - as above   Kelly Splinter MD [facility] pager [phone]   07/23/2017, 2:03 PM    Recent Labs  Lab 07/21/17 0341 07/21/17 1539 07/22/17 0324 07/23/17 0319  NA 131* 132* 135 140  K 5.1 4.8 4.4 3.3*  CL 73* 85* 91* 100*  CO2 31 25 27 28   GLUCOSE 129* 119* 126* 130*  BUN 64* 67* 66* 48*  CREATININE 11.59* 11.32* 9.19* 4.11*  CALCIUM 9.9 7.6* 7.3* 7.4*  PHOS 8.3*  --  6.2* 3.1   Recent Labs  Lab 07/21/17 0341 07/22/17 0324 07/23/17 0319  ALBUMIN 4.5 3.1* 3.0*   Recent Labs  Lab 07/21/17 0341 07/22/17 0324 07/23/17 0319  WBC 16.9* 12.6* 10.4  HGB 16.5* 12.8 10.4*  HCT 47.6* 37.7 32.4*  MCV 80.3 81.3 82.9  PLT 257 199 167   Iron/TIBC/Ferritin/ %Sat    Component Value Date/Time   IRON 11 (L) 10/19/2013 1140   TIBC 254 10/19/2013 1140   FERRITIN 32 10/19/2013 1140   IRONPCTSAT 4 (L) 10/19/2013 1140

## 2017-07-23 NOTE — Progress Notes (Signed)
[facility] Progress Note  2 Days Post-Op  Subjective: CC: no complaints Patient tolerated NGT clamping yesterday and denies nausea or abdominal pain. Passing flatus. Tolerating CLD.  UOP good. VSS.   Objective: Vital signs in last 24 hours: Temp:  [98.2 F (36.8 C)-98.9 F (37.2 C)] 98.8 F (37.1 C) (01/25 0810) Pulse Rate:  [101-126] 103 (01/25 0600) Resp:  [18-25] 21 (01/25 0600) BP: (86-137)/(61-89) 135/89 (01/25 0400) SpO2:  [89 %-99 %] 96 % (01/25 0600) Weight:  [140.7 kg (310 lb 3 oz)] 140.7 kg (310 lb 3 oz) (01/25 0426) Last BM Date: 07/19/17  Intake/Output from previous day: 01/24 0701 - 01/25 0700 In: 3840 [P.O.:240; I.V.:3300; IV Piggyback:300] Out: 2890 [Urine:2325; Emesis/NG output:400; Drains:165] Intake/Output this shift: Total I/O In: -  Out: 170 [Urine:150; Drains:20]  PE: Gen:  Alert, NAD, pleasant Card:  Regular rate and rhythm, pedal pulses 2+ BL Pulm:  Normal effort, clear to auscultation bilaterally Abd: Soft, non-tender, non-distended, bowel sounds hypoactive, no HSM, incision C/D/I, drain present with serosanguinous output GU: foley present with straw colored urine in tubing Skin: warm and dry, no rashes  Psych: A&Ox3    Lab Results:  Recent Labs    07/22/17 0324 07/23/17 0319  WBC 12.6* 10.4  HGB 12.8 10.4*  HCT 37.7 32.4*  PLT 199 167   BMET Recent Labs    07/22/17 0324 07/23/17 0319  NA 135 140  K 4.4 3.3*  CL 91* 100*  CO2 27 28  GLUCOSE 126* 130*  BUN 66* 48*  CREATININE 9.19* 4.11*  CALCIUM 7.3* 7.4*   PT/INR No results for input(s): LABPROT, INR in the last 72 hours. CMP     Component Value Date/Time   NA 140 07/23/2017 0319   NA 139 10/20/2016 1433   K 3.3 (L) 07/23/2017 0319   K 4.3 10/20/2016 1433   CL 100 (L) 07/23/2017 0319   CO2 28 07/23/2017 0319   CO2 26 10/20/2016 1433   GLUCOSE 130 (H) 07/23/2017 0319   GLUCOSE 101 10/20/2016 1433   BUN 48 (H) 07/23/2017 0319   BUN 13.4 10/20/2016  1433   CREATININE 4.11 (H) 07/23/2017 0319   CREATININE 0.9 10/20/2016 1433   CALCIUM 7.4 (L) 07/23/2017 0319   CALCIUM 10.0 10/20/2016 1433   PROT 8.4 (H) 07/12/2017 0549   PROT 7.9 10/20/2016 1433   ALBUMIN 3.0 (L) 07/23/2017 0319   ALBUMIN 4.0 10/20/2016 1433   AST 23 07/12/2017 0549   AST 18 10/20/2016 1433   ALT 26 07/12/2017 0549   ALT 14 10/20/2016 1433   ALKPHOS 96 07/12/2017 0549   ALKPHOS 102 10/20/2016 1433   BILITOT 1.1 07/12/2017 0549   BILITOT 0.30 10/20/2016 1433   GFRNONAA 12 (L) 07/23/2017 0319   GFRAA 14 (L) 07/23/2017 0319   Lipase     Component Value Date/Time   LIPASE 42 07/12/2017 0549       Studies/Results: No results found.  Anti-infectives: Anti-infectives (From admission, onward)   Start     Dose/Rate Route Frequency Ordered Stop   07/21/17 1045  ceFAZolin (ANCEF) IVPB 2g/100 mL premix  Status:  Discontinued     2 g 200 mL/hr over 30 Minutes Intravenous  Once 07/21/17 0954 07/22/17 1501   07/20/17 0600  ceFAZolin (ANCEF) 3 g in dextrose 5 % 50 mL IVPB  Status:  Discontinued     3 g 130 mL/hr over 30 Minutes Intravenous On call to O.R. 07/19/17 1404 07/20/17 0935   07/13/17 1400  cefTRIAXone (ROCEPHIN) 2 g in dextrose 5 % 50 mL IVPB     2 g 100 mL/hr over 30 Minutes Intravenous Every 24 hours 07/13/17 1218 07/14/17 1538   07/12/17 1300  cefTRIAXone (ROCEPHIN) 1 g in dextrose 5 % 50 mL IVPB  Status:  Discontinued     1 g 100 mL/hr over 30 Minutes Intravenous Every 24 hours 07/12/17 1222 07/13/17 1218       Assessment/Plan Hx of CKD GERD Stage IIC(T4b, N0), adenocarcinoma the sigmoid colon - completed chemotherapy - in remission 09/2016,dr. Betsy Coder Morbid Obesity E.Coli UTI - had single dose abx, per primary service  SBO S/p ex lap with LOA and repair of ventral hernia with mesh  - POD#2 - mobilize, IS - drain with 165 cc out - tolerated NGT clamped and CLD - d/c NGT and keep on CLD AKI/Acute renal failure- Cr 4.11 this  AM, trending down, continue to follow - UOP improved - d/c foley   FEN: CLD, continue IVF at decreased rate; replace K  VTE: SCDs, SQ heparin  ID: rocephin 1/15; Ancef periop     LOS: 10 days    Brigid Re , [facility] Surgery 07/23/2017, 8:43 AM Pager: [phone] Mon-Fri 7:00 am-4:30 pm Sat-Sun 7:00 am-11:30 am

## 2017-07-24 LAB — RENAL FUNCTION PANEL
Albumin: 2.9 g/dL — ABNORMAL LOW (ref 3.5–5.0)
Anion gap: 7 (ref 5–15)
BUN: 26 mg/dL — ABNORMAL HIGH (ref 6–20)
CO2: 26 mmol/L (ref 22–32)
Calcium: 7.7 mg/dL — ABNORMAL LOW (ref 8.9–10.3)
Chloride: 101 mmol/L (ref 101–111)
Creatinine, Ser: 1.65 mg/dL — ABNORMAL HIGH (ref 0.44–1.00)
GFR calc Af Amer: 43 mL/min — ABNORMAL LOW (ref >60)
GFR calc non Af Amer: 37 mL/min — ABNORMAL LOW (ref >60)
Glucose, Bld: 106 mg/dL — ABNORMAL HIGH (ref 65–99)
Phosphorus: 1.5 mg/dL — ABNORMAL LOW (ref 2.5–4.6)
Potassium: 3.4 mmol/L — ABNORMAL LOW (ref 3.5–5.1)
Sodium: 134 mmol/L — ABNORMAL LOW (ref 135–145)

## 2017-07-24 LAB — CBC
HCT: 31.1 % — ABNORMAL LOW (ref 36.0–46.0)
Hemoglobin: 10.1 g/dL — ABNORMAL LOW (ref 12.0–15.0)
MCH: 27.2 pg (ref 26.0–34.0)
MCHC: 32.5 g/dL (ref 30.0–36.0)
MCV: 83.8 fL (ref 78.0–100.0)
Platelets: 177 10*3/uL (ref 150–400)
RBC: 3.71 MIL/uL — ABNORMAL LOW (ref 3.87–5.11)
RDW: 15.9 % — ABNORMAL HIGH (ref 11.5–15.5)
WBC: 8 10*3/uL (ref 4.0–10.5)

## 2017-07-24 MED ORDER — POTASSIUM CHLORIDE IN NACL 20-0.9 MEQ/L-% IV SOLN
INTRAVENOUS | Status: DC
  Administered 2017-07-24 – 2017-07-25 (×2): via INTRAVENOUS
  Filled 2017-07-24 (×4): qty 1000

## 2017-07-24 NOTE — Progress Notes (Signed)
Patient ID: Amy Bentley, female   DOB: 01/01/1971, 47 y.o.   MRN: [id number] 3 Days Post-Op   Subjective: No complaints.  Denies abdominal pain or nausea.  Tolerating clear liquids.  States she is voiding normal amounts.  Had a bowel movement this morning.  Objective: Vital signs in last 24 hours: Temp:  [98.6 F (37 C)-99.1 F (37.3 C)] 98.6 F (37 C) (01/26 0520) Pulse Rate:  [102-110] 103 (01/26 0520) Resp:  [18] 18 (01/26 0520) BP: (92-129)/(70-79) 125/79 (01/26 0520) SpO2:  [96 %-99 %] 99 % (01/26 0520) Weight:  [138.5 kg (305 lb 6.4 oz)] 138.5 kg (305 lb 6.4 oz) (01/26 0520) Last BM Date: 07/24/17  Intake/Output from previous day: 01/25 0701 - 01/26 0700 In: 2245.8 [P.O.:720; I.V.:1425.8; IV Piggyback:100] Out: 483 [Urine:350; Drains:133] Intake/Output this shift: No intake/output data recorded.  General appearance: alert, cooperative and no distress GI: Soft and nontender.  JP drainage serosanguineous. Incision/Wound: No erythema or drainage.  Lab Results:  Recent Labs    07/23/17 0319 07/24/17 0358  WBC 10.4 8.0  HGB 10.4* 10.1*  HCT 32.4* 31.1*  PLT 167 177   BMET Recent Labs    07/23/17 0319 07/24/17 0721  NA 140 134*  K 3.3* 3.4*  CL 100* 101  CO2 28 26  GLUCOSE 130* 106*  BUN 48* 26*  CREATININE 4.11* 1.65*  CALCIUM 7.4* 7.7*     Studies/Results: No results found.  Anti-infectives: Anti-infectives (From admission, onward)   Start     Dose/Rate Route Frequency Ordered Stop   07/21/17 1045  ceFAZolin (ANCEF) IVPB 2g/100 mL premix  Status:  Discontinued     2 g 200 mL/hr over 30 Minutes Intravenous  Once 07/21/17 0954 07/22/17 1501   07/20/17 0600  ceFAZolin (ANCEF) 3 g in dextrose 5 % 50 mL IVPB  Status:  Discontinued     3 g 130 mL/hr over 30 Minutes Intravenous On call to O.R. 07/19/17 1404 07/20/17 0935   07/13/17 1400  cefTRIAXone (ROCEPHIN) 2 g in dextrose 5 % 50 mL IVPB     2 g 100 mL/hr over 30 Minutes Intravenous Every 24  hours 07/13/17 1218 07/14/17 1538   07/12/17 1300  cefTRIAXone (ROCEPHIN) 1 g in dextrose 5 % 50 mL IVPB  Status:  Discontinued     1 g 100 mL/hr over 30 Minutes Intravenous Every 24 hours 07/12/17 1222 07/13/17 1218      Assessment/Plan: s/p Procedure(s): EXPLORATORY LAPAROTOMY AND LYSIS OF ADHESIONS WITH  REPAIR OF VENTRAL HERNIA WITH MESH  Doing very well.  Advance to full liquid diet. Acute renal insufficiency nearly resolved.   LOS: 11 days    Edward Jolly 07/24/2017

## 2017-07-25 LAB — BASIC METABOLIC PANEL
Anion gap: 6 (ref 5–15)
BUN: 18 mg/dL (ref 6–20)
CO2: 25 mmol/L (ref 22–32)
Calcium: 8 mg/dL — ABNORMAL LOW (ref 8.9–10.3)
Chloride: 109 mmol/L (ref 101–111)
Creatinine, Ser: 1.15 mg/dL — ABNORMAL HIGH (ref 0.44–1.00)
GFR calc Af Amer: >60 mL/min (ref >60)
GFR calc non Af Amer: 57 mL/min — ABNORMAL LOW (ref >60)
Glucose, Bld: 121 mg/dL — ABNORMAL HIGH (ref 65–99)
Potassium: 3.9 mmol/L (ref 3.5–5.1)
Sodium: 140 mmol/L (ref 135–145)

## 2017-07-25 NOTE — Progress Notes (Signed)
Patient ID: Amy Bentley, female   DOB: 07-14-1970, 47 y.o.   MRN: [id number] 4 Days Post-Op   Subjective: No complaints.  Denies pain or nausea.  Has had several bowel movements.  Tolerating clear liquids.  Objective: Vital signs in last 24 hours: Temp:  [98.2 F (36.8 C)-100 F (37.8 C)] 98.6 F (37 C) (01/27 0545) Pulse Rate:  [76-112] 76 (01/27 0545) Resp:  [18] 18 (01/27 0545) BP: (100-131)/(54-66) 100/54 (01/27 0545) SpO2:  [97 %-98 %] 98 % (01/27 0545) Weight:  [137.9 kg (304 lb)] 137.9 kg (304 lb) (01/27 0612) Last BM Date: 07/25/17  Intake/Output from previous day: 01/26 0701 - 01/27 0700 In: 1908.3 [P.O.:1800; I.V.:108.3] Out: 1040 [Urine:950; Drains:90] Intake/Output this shift: No intake/output data recorded.  General appearance: alert, cooperative and no distress GI: normal findings: soft, non-tender Incision/Wound: No erythema or drainage.  JP drainage clearing.  Lab Results:  Recent Labs    07/23/17 0319 07/24/17 0358  WBC 10.4 8.0  HGB 10.4* 10.1*  HCT 32.4* 31.1*  PLT 167 177   BMET Recent Labs    07/24/17 0721 07/25/17 0436  NA 134* 140  K 3.4* 3.9  CL 101 109  CO2 26 25  GLUCOSE 106* 121*  BUN 26* 18  CREATININE 1.65* 1.15*  CALCIUM 7.7* 8.0*     Studies/Results: No results found.  Anti-infectives: Anti-infectives (From admission, onward)   Start     Dose/Rate Route Frequency Ordered Stop   07/21/17 1045  ceFAZolin (ANCEF) IVPB 2g/100 mL premix  Status:  Discontinued     2 g 200 mL/hr over 30 Minutes Intravenous  Once 07/21/17 0954 07/22/17 1501   07/20/17 0600  ceFAZolin (ANCEF) 3 g in dextrose 5 % 50 mL IVPB  Status:  Discontinued     3 g 130 mL/hr over 30 Minutes Intravenous On call to O.R. 07/19/17 1404 07/20/17 0935   07/13/17 1400  cefTRIAXone (ROCEPHIN) 2 g in dextrose 5 % 50 mL IVPB     2 g 100 mL/hr over 30 Minutes Intravenous Every 24 hours 07/13/17 1218 07/14/17 1538   07/12/17 1300  cefTRIAXone (ROCEPHIN) 1 g  in dextrose 5 % 50 mL IVPB  Status:  Discontinued     1 g 100 mL/hr over 30 Minutes Intravenous Every 24 hours 07/12/17 1222 07/13/17 1218      Assessment/Plan: s/p Procedure(s): EXPLORATORY LAPAROTOMY AND LYSIS OF ADHESIONS WITH  REPAIR OF VENTRAL HERNIA WITH MESH  Acute renal failure resolved Doing well postoperatively.  Advance to regular diet.  Anticipate discharge tomorrow.    LOS: 12 days    Edward Jolly 07/25/2017

## 2017-07-26 NOTE — Discharge Summary (Signed)
[facility] Discharge Summary   Patient ID: LEEYA RUSCONI MRN: [id number] DOB/AGE: July 10, 1970 47 y.o.  Admit date: 07/12/2017 Discharge date: 07/26/2017  Admitting Diagnosis: SBO  Discharge Diagnosis Ventral hernia - s/p repair Acute renal failure - improved  Consultants Urology Nephrology  Imaging: No results found.  Procedures Dr. Excell Seltzer (07/21/17) - exploratory laparotomy and lysis of adhesions with repair of ventral hernia with mesh  Hospital Course:  Patient is a 47 y/o female with a history of open sigmoid colectomy for adenocarcinoma with repair of colovesical fistula in 2015 who presented to [facility] with nausea vomiting.  Workup showed SBO. Patient also noted to have a large ventral hernia that was reducible and without signs of incarceration or strangulation. Patient was admitted and started on the SBO protocol with IV fluids for hydration. Nasogastric tube was placed 1/15, clamping trials initiated 1/17. Nasogastric tube was removed 1/18 and patient was started on a liquid diet. Patient was advanced to a regular diet 1/19, but noted to have increased nausea and emesis 1/21. Abdominal film 1/21 showed persistent SBO. It was decided that patient needed exploration after failure to resolve with conservative management. OR was planned for 1/22, but labs that AM showed patient to be in acute renal failure. Both urology and nephrology were consulted. It was felt that acute renal failure was likely due to dehydration despite being on IV fluids and that renal function should improve with aggressive IV hydration. Nephrology did not feel that surgery needed to be delayed any further. Patient was taken to the OR 1/23. Tolerated procedure well and was transferred to the floor.  Diet was advanced as tolerated.  On POD#5, the patient was voiding well, tolerating diet, ambulating well, pain well controlled, vital signs stable, incisions c/d/i and felt stable for discharge home.   Patient will follow up in our office in 1 week and knows to call with questions or concerns.  She will call to confirm appointment date/time.    Physical Exam: General:  Alert, NAD, pleasant, comfortable Abd:  Soft, ND, mild tenderness, incisions C/D/I, drain with sanguinous drainage   Allergies as of 07/26/2017      Reactions   Bactrim [sulfamethoxazole-trimethoprim]    Causes disorientation, inability to speak      Medication List    STOP taking these medications   cephALEXin 500 MG capsule Commonly known as:  KEFLEX     TAKE these medications   ibuprofen 200 MG tablet Commonly known as:  ADVIL,MOTRIN Take 600 mg by mouth every 6 (six) hours as needed for mild pain.   ondansetron 4 MG tablet Commonly known as:  ZOFRAN Take 1 tablet (4 mg total) by mouth every 8 (eight) hours as needed for nausea or vomiting.        Follow-up Information    Excell Seltzer, MD. Go on 08/04/2017.   Specialty:  General Surgery Why:  Your appointment is at 11:45 AM. Please arrive 30 min prior to appointment time. Bring photo ID and insurance information. Please be prepared to meet with billing to discuss balance.  Contact information: [address] [place] [address] [phone]           Signed: Brigid Re, [facility] 07/26/2017, 9:30 AM Pager: [phone] Mon-Fri 7:00 am-4:30 pm Sat-Sun 7:00 am-11:30 am

## 2017-07-26 NOTE — Progress Notes (Signed)
Discharge instructions given. Pt verbalized understanding and all questions were answered.  

## 2017-08-04 DIAGNOSIS — Z09 Encounter for follow-up examination after completed treatment for conditions other than malignant neoplasm: Secondary | ICD-10-CM | POA: Diagnosis not present

## 2017-08-13 DIAGNOSIS — Z87448 Personal history of other diseases of urinary system: Secondary | ICD-10-CM | POA: Diagnosis not present

## 2017-08-18 ENCOUNTER — Encounter: Payer: Self-pay | Admitting: Gastroenterology

## 2017-08-19 DIAGNOSIS — E876 Hypokalemia: Secondary | ICD-10-CM | POA: Diagnosis not present

## 2017-08-23 ENCOUNTER — Encounter: Payer: Self-pay | Admitting: Gastroenterology

## 2017-09-10 DIAGNOSIS — E876 Hypokalemia: Secondary | ICD-10-CM | POA: Diagnosis not present

## 2017-09-20 ENCOUNTER — Telehealth: Payer: Self-pay | Admitting: *Deleted

## 2017-09-20 NOTE — Telephone Encounter (Signed)
Yes, during a [facility] Thursday MAC day.  Thanks

## 2017-09-20 NOTE — Telephone Encounter (Signed)
Dr Ardis Hughs,  This pt has been scheduled for the [facility] for a colon with you 4-30. She has a BMI of 63.11.  Her last colon was done at [facility] in 2016 for a personal hx of colon cancer.   3 yr recall colon   Can she be direct at [facility]?  Please advise. Thanks, Lelan Pons

## 2017-09-20 NOTE — Telephone Encounter (Signed)
Patty,  Can you please schedule this for me.  Thank,Marie

## 2017-09-21 ENCOUNTER — Other Ambulatory Visit: Payer: Self-pay

## 2017-09-21 DIAGNOSIS — Z1211 Encounter for screening for malignant neoplasm of colon: Secondary | ICD-10-CM

## 2017-09-21 NOTE — Telephone Encounter (Signed)
I called the patient and discussed the situation with her. For her safety the procedure will be done at the hospital. She needs a Thursday AM in late June. New employment. Will move the procedure to 12/23/17. Pre-visit 12/10/17 at 8:00 am

## 2017-09-23 ENCOUNTER — Inpatient Hospital Stay: Payer: BLUE CROSS/BLUE SHIELD | Attending: Oncology | Admitting: Nurse Practitioner

## 2017-09-23 ENCOUNTER — Encounter: Payer: Self-pay | Admitting: Nurse Practitioner

## 2017-09-23 ENCOUNTER — Telehealth: Payer: Self-pay | Admitting: Oncology

## 2017-09-23 ENCOUNTER — Inpatient Hospital Stay: Payer: BLUE CROSS/BLUE SHIELD

## 2017-09-23 VITALS — BP 134/87 | HR 91 | Temp 98.9°F | Resp 17 | Ht 62.0 in | Wt 306.1 lb

## 2017-09-23 DIAGNOSIS — D509 Iron deficiency anemia, unspecified: Secondary | ICD-10-CM | POA: Diagnosis not present

## 2017-09-23 DIAGNOSIS — Z9049 Acquired absence of other specified parts of digestive tract: Secondary | ICD-10-CM | POA: Insufficient documentation

## 2017-09-23 DIAGNOSIS — L299 Pruritus, unspecified: Secondary | ICD-10-CM | POA: Diagnosis not present

## 2017-09-23 DIAGNOSIS — C187 Malignant neoplasm of sigmoid colon: Secondary | ICD-10-CM

## 2017-09-23 NOTE — Telephone Encounter (Signed)
Appointments scheduled AVS printed per 3/28 los °

## 2017-09-23 NOTE — Progress Notes (Signed)
[facility] OFFICE PROGRESS NOTE   Diagnosis: Colon cancer  INTERVAL HISTORY:   Amy Bentley returns for follow-up.  She was hospitalized in January of this year with a small bowel obstruction.  She underwent an exploratory laparotomy and lysis of adhesions with repair of a ventral hernia on 07/21/2017.  Bowels moving regularly.  No rectal bleeding.  She has a good appetite.  No urinary symptoms.  She reports being scheduled for a colonoscopy in June of this year.  Objective:  Vital signs in last 24 hours:  Blood pressure 134/87, pulse 91, temperature 98.9 F (37.2 C), temperature source Oral, resp. rate 17, height 5' 2"  (1.575 m), weight (!) 306 lb 1.6 oz (138.8 kg), SpO2 100 %.    HEENT: Neck without mass. Lymphatics: No palpable cervical, supraclavicular, axillary or inguinal lymph nodes. Resp: Lungs clear bilaterally. Cardio: Regular rate and rhythm. GI: Abdomen soft and nontender.  No hepatomegaly. Vascular: No leg edema.  Lab Results:  Lab Results  Component Value Date   WBC 8.0 07/24/2017   HGB 10.1 (L) 07/24/2017   HCT 31.1 (L) 07/24/2017   MCV 83.8 07/24/2017   PLT 177 07/24/2017   NEUTROABS 8.5 (H) 07/12/2017    Imaging:  No results found.  Medications: I have reviewed the patient's current medications.  Assessment/Plan: 1. Stage IIC (T4b, N0), adenocarcinoma the sigmoid colon, status post a partial colectomy and en bloc bladder resection 10/20/2013, negative surgical margins, no preoperative colonoscopy. No loss of mismatch repair protein expression. Microsatellite stable.  Mildly elevated preoperative CEA.   CEA normal (1.0) on 11/09/2013.   Cycle 1 adjuvant FOLFOX 11/23/2013.   Cycle 2 adjuvant FOLFOX 12/07/2013.   Cycle 3 adjuvant FOLFOX 12/21/2013 (oxaliplatin dose reduced to 65 mg per meter squared due to neuropathy symptoms).   Cycle 4 adjuvant FOLFOX 01/04/2014.   Cycle 5 adjuvant FOLFOX 01/18/2014.   Cycle 6  adjuvant FOLFOX 02/01/2014-oxaliplatin discontinued prematurely secondary to an apparent hypersensitivity reaction.   Cycle 7 of adjuvant FOLFOX 02/15/2014.   Cycle 8 adjuvant FOLFOX 03/01/2014.   Cycle 9 adjuvant FOLFOX 03/15/2014. Oxaliplatin was held due to increased neuropathy symptoms  Cycle 10 FOLFOX 03/29/2014  Cycle 11 FOLFOX 04/12/2014-oxaliplatin discontinued  Cycle 12 FOLFOX 04/26/2014-oxaliplatin discontinued  Negative surveillance colonoscopy 08/16/2014  CTs of the chest, abdomen, and pelvis 10/01/2014-negative for metastatic disease  CTs of the chest, abdomen, and pelvis 10/11/2015-negative for recurrent disease  CTs of the chest, abdomen, and pelvis 10/21/2016-negative for recurrent disease 2. history of microcytic anemia-likely iron deficiency anemia. She continues oral iron. 3. Right breast nodule on the chest CT 11/14/2013.  Diagnostic mammogram and ultrasound 12/26/2013 showed a probable fibroadenoma within the inner right breast.   She discussed management options with the radiologist including excision, biopsy and short interval followup. She elected short interval followup. A followup ultrasound is recommended at 6, 12 and 24 months to assess stability.  11/08/2014 Needle core biopsy right breast 3:00 showed fibroadenoma. No atypia or malignancy identified.  CT 10/21/2016-medial right breast mass is larger  Mammogram 12/25/2016 no findings suspicious for malignancy. 4. Port-A-Cath placement 11/15/2013. 5. Delayed nausea following cycle 1 FOLFOX. Aloxi and Emend added with cycle 2 with improvement. 6. 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 Oxaliplatin was dose reduced to 65 mg per meter squared beginning with cycle 3. Increased neuropathy symptoms with persistent numbness/tingling in both feet following cycle 8 FOLFOX. Oxaliplatin held with cycle 9. Neuropathy symptoms  improved 03/29/2014. Oxaliplatin resumed with cycle 10. Oxaliplatin discontinued with cycles 11 and 12 FOLFOX secondary to persistent neuropathy symptoms. 7. Ocular pruritus. Most likely related to 5-fluorouracil. She did not experience ocular pruritus following cycle 5 FOLFOX.  8. Hypersensitivity reaction to oxaliplatin with cycle 6 FOLFOX-she was premedicated with Decadron and given additional steroid/antihistamine premedication beginning with cycle 7 FOLFOX with no reaction. 9. Hospitalization 07/12/2017 through 07/26/2017 with a small bowel obstruction status post exploratory laparotomy and lysis of adhesions with repair of ventral hernia with mesh.     Disposition: Amy Bentley remains in clinical remission from colon cancer.  She will return to the lab today for a CEA.  She will return for follow-up and CEA in 8 months.  She will contact the office in the interim with any problems.  Plan reviewed with Dr. Benay Spice.    Ned Card ANP/GNP-BC   09/23/2017  2:21 PM

## 2017-09-24 LAB — CEA (IN HOUSE-CHCC): CEA (CHCC-In House): 2.03 ng/mL (ref 0.00–5.00)

## 2017-09-27 ENCOUNTER — Telehealth: Payer: Self-pay | Admitting: Emergency Medicine

## 2017-09-27 NOTE — Telephone Encounter (Addendum)
Pt verbalized understanding of this.   ----- Message from Owens Shark, NP sent at 09/24/2017 11:48 AM EDT ----- Please let her know the CEA is normal.  Follow-up as scheduled.

## 2017-10-26 ENCOUNTER — Encounter: Payer: BLUE CROSS/BLUE SHIELD | Admitting: Gastroenterology

## 2017-12-10 ENCOUNTER — Other Ambulatory Visit: Payer: Self-pay

## 2017-12-10 ENCOUNTER — Ambulatory Visit (AMBULATORY_SURGERY_CENTER): Payer: Self-pay | Admitting: *Deleted

## 2017-12-10 VITALS — Ht 64.0 in | Wt 319.6 lb

## 2017-12-10 DIAGNOSIS — Z85038 Personal history of other malignant neoplasm of large intestine: Secondary | ICD-10-CM

## 2017-12-10 MED ORDER — PEG 3350-KCL-NA BICARB-NACL 420 G PO SOLR: 4000.0000 mL | Freq: Once | ORAL | 0 refills | Status: AC

## 2017-12-10 NOTE — Progress Notes (Signed)
Patient denies any allergies to eggs or soy. Patient denies any problems with anesthesia/sedation. Patient denies any oxygen use at home. Patient denies taking any diet/weight loss medications or blood thinners. EMMI education offered, pt declined.  

## 2017-12-21 ENCOUNTER — Telehealth: Payer: Self-pay | Admitting: Gastroenterology

## 2017-12-21 NOTE — Telephone Encounter (Signed)
The pt has been advised to call her PCP regarding cough and possible chest congestion prior to procedure on Thursday.

## 2017-12-23 ENCOUNTER — Encounter (HOSPITAL_COMMUNITY): Payer: Self-pay

## 2017-12-23 ENCOUNTER — Other Ambulatory Visit: Payer: Self-pay

## 2017-12-23 ENCOUNTER — Encounter (HOSPITAL_COMMUNITY)
Admission: RE | Disposition: A | Discharge status: Home / Self Care | Payer: Self-pay | Source: Ambulatory Visit | Attending: Gastroenterology

## 2017-12-23 ENCOUNTER — Ambulatory Visit (HOSPITAL_COMMUNITY)
Admission: RE | Admit: 2017-12-23 | Discharge: 2017-12-23 | Disposition: A | Discharge status: Home / Self Care | Payer: BLUE CROSS/BLUE SHIELD | Source: Ambulatory Visit | Attending: Gastroenterology | Admitting: Gastroenterology

## 2017-12-23 ENCOUNTER — Ambulatory Visit (HOSPITAL_COMMUNITY): Payer: BLUE CROSS/BLUE SHIELD | Admitting: Certified Registered Nurse Anesthetist

## 2017-12-23 DIAGNOSIS — Z8249 Family history of ischemic heart disease and other diseases of the circulatory system: Secondary | ICD-10-CM | POA: Insufficient documentation

## 2017-12-23 DIAGNOSIS — D649 Anemia, unspecified: Secondary | ICD-10-CM | POA: Diagnosis not present

## 2017-12-23 DIAGNOSIS — Z87440 Personal history of urinary (tract) infections: Secondary | ICD-10-CM | POA: Insufficient documentation

## 2017-12-23 DIAGNOSIS — Z833 Family history of diabetes mellitus: Secondary | ICD-10-CM | POA: Insufficient documentation

## 2017-12-23 DIAGNOSIS — K219 Gastro-esophageal reflux disease without esophagitis: Secondary | ICD-10-CM | POA: Insufficient documentation

## 2017-12-23 DIAGNOSIS — D508 Other iron deficiency anemias: Secondary | ICD-10-CM | POA: Diagnosis not present

## 2017-12-23 DIAGNOSIS — Z800 Family history of malignant neoplasm of digestive organs: Secondary | ICD-10-CM | POA: Insufficient documentation

## 2017-12-23 DIAGNOSIS — Z87891 Personal history of nicotine dependence: Secondary | ICD-10-CM | POA: Insufficient documentation

## 2017-12-23 DIAGNOSIS — Z980 Intestinal bypass and anastomosis status: Secondary | ICD-10-CM | POA: Insufficient documentation

## 2017-12-23 DIAGNOSIS — Z1211 Encounter for screening for malignant neoplasm of colon: Secondary | ICD-10-CM | POA: Insufficient documentation

## 2017-12-23 DIAGNOSIS — Z9221 Personal history of antineoplastic chemotherapy: Secondary | ICD-10-CM | POA: Diagnosis not present

## 2017-12-23 DIAGNOSIS — Z85038 Personal history of other malignant neoplasm of large intestine: Secondary | ICD-10-CM | POA: Insufficient documentation

## 2017-12-23 DIAGNOSIS — Z6841 Body Mass Index (BMI) 40.0 and over, adult: Secondary | ICD-10-CM | POA: Insufficient documentation

## 2017-12-23 DIAGNOSIS — Z881 Allergy status to other antibiotic agents status: Secondary | ICD-10-CM | POA: Insufficient documentation

## 2017-12-23 DIAGNOSIS — K5732 Diverticulitis of large intestine without perforation or abscess without bleeding: Secondary | ICD-10-CM | POA: Diagnosis not present

## 2017-12-23 DIAGNOSIS — E785 Hyperlipidemia, unspecified: Secondary | ICD-10-CM | POA: Diagnosis not present

## 2017-12-23 HISTORY — PX: COLONOSCOPY WITH PROPOFOL: SHX5780

## 2017-12-23 SURGERY — COLONOSCOPY WITH PROPOFOL
Anesthesia: Monitor Anesthesia Care

## 2017-12-23 MED ORDER — PROPOFOL 10 MG/ML IV BOLUS
INTRAVENOUS | Status: AC
  Filled 2017-12-23: qty 60

## 2017-12-23 MED ORDER — SODIUM CHLORIDE 0.9 % IV SOLN: INTRAVENOUS | Status: DC

## 2017-12-23 MED ORDER — LACTATED RINGERS IV SOLN
INTRAVENOUS | Status: DC
  Administered 2017-12-23: 07:00:00 via INTRAVENOUS

## 2017-12-23 MED ORDER — PROPOFOL 10 MG/ML IV BOLUS
INTRAVENOUS | Status: DC | PRN
  Administered 2017-12-23: 20 mg via INTRAVENOUS
  Administered 2017-12-23: 40 mg via INTRAVENOUS
  Administered 2017-12-23 (×4): 20 mg via INTRAVENOUS

## 2017-12-23 MED ORDER — PROPOFOL 500 MG/50ML IV EMUL
INTRAVENOUS | Status: DC | PRN
  Administered 2017-12-23: 100 ug/kg/min via INTRAVENOUS

## 2017-12-23 SURGICAL SUPPLY — 22 items

## 2017-12-23 NOTE — Anesthesia Postprocedure Evaluation (Signed)
Anesthesia Post Note  Patient: Amy Bentley  Procedure(s) Performed: COLONOSCOPY WITH PROPOFOL (N/A )     Patient location during evaluation: PACU Anesthesia Type: MAC Level of consciousness: awake and alert Pain management: pain level controlled Vital Signs Assessment: post-procedure vital signs reviewed and stable Respiratory status: spontaneous breathing, nonlabored ventilation, respiratory function stable and patient connected to nasal cannula oxygen Cardiovascular status: stable and blood pressure returned to baseline Postop Assessment: no apparent nausea or vomiting Anesthetic complications: no    Last Vitals:  Vitals:   12/23/17 0650 12/23/17 0742  BP: (!) 144/94   Pulse: 84 89  Resp: 12 11  Temp: 36.6 C 36.7 C  SpO2: 100% 100%    Last Pain:  Vitals:   12/23/17 0742  TempSrc: Oral  PainSc: 0-No pain                 Barabara Motz S

## 2017-12-23 NOTE — Op Note (Signed)
[facility] Patient Name: Amy Bentley Procedure Date: 12/23/2017 MRN: [id number] Attending MD: Milus Banister , MD Date of Birth: Jan 23, 1971 CSN: [id number] Age: 47 Admit Type: Outpatient Procedure:                Colonoscopy Indications:              High risk colon cancer surveillance: Personal                            history of colon cancer; T4N0 sigmoid cancer                            (invading urinary bladder) s/p 09/2013 segmental                            colon resection and same time bladder surgery;                           neoadjuvant chemo completed 03/2014; due to                            complexity of case, mircoperforation she was not                            able to undergo pre-op colonoscopy. Colonoscopy                            2016 found normal colo-colonic anastomosis at 25cm,                            no polyps or cancers. Providers:                Milus Banister, MD, Cleda Daub, RN, Charolette Child, Technician Referring MD:              Medicines:                Monitored Anesthesia Care Complications:            No immediate complications. Estimated blood loss:                            None. Estimated Blood Loss:     Estimated blood loss: none. Procedure:                Pre-Anesthesia Assessment:                           - Prior to the procedure, a History and Physical                            was performed, and patient medications and                            allergies were reviewed. The patient's tolerance of  previous anesthesia was also reviewed. The risks                            and benefits of the procedure and the sedation                            options and risks were discussed with the patient.                            All questions were answered, and informed consent                            was obtained. Prior Anticoagulants: The patient has                           taken no previous anticoagulant or antiplatelet                            agents. ASA Grade Assessment: III - A patient with                            severe systemic disease. After reviewing the risks                            and benefits, the patient was deemed in                            satisfactory condition to undergo the procedure.                           After obtaining informed consent, the colonoscope                            was passed under direct vision. Throughout the                            procedure, the patient's blood pressure, pulse, and                            oxygen saturations were monitored continuously. The                            EC-[id number] ([id number]) scope was introduced through                            the anus and advanced to the the cecum, identified                            by appendiceal orifice and ileocecal valve. The                            colonoscopy was performed without difficulty. The  patient tolerated the procedure well. The quality                            of the bowel preparation was good. The ileocecal                            valve, appendiceal orifice, and rectum were                            photographed. Scope In: 7:27:16 AM Scope Out: 7:35:14 AM Scope Withdrawal Time: 0 hours 5 minutes 17 seconds  Total Procedure Duration: 0 hours 7 minutes 58 seconds  Findings:      Normal colo-colonic anastomosis at about 25cm from the anus.      The examination was otherwise normal. Impression:               - Normal colo-colonic anastomosis at 25cm.                           - No polyps or cancers. Moderate Sedation:      N/A- Per Anesthesia Care Recommendation:           - Patient has a contact number available for                            emergencies. The signs and symptoms of potential                            delayed complications were discussed with the                             patient. Return to normal activities tomorrow.                            Written discharge instructions were provided to the                            patient.                           - Resume previous diet.                           - Continue present medications.                           - Repeat colonoscopy in 5 years. Procedure Code(s):        --- Professional ---                           [phone], Colonoscopy, flexible; diagnostic, including                            collection of specimen(s) by brushing or washing,                            when performed (separate  procedure) Diagnosis Code(s):        --- Professional ---                           M27.078, Personal history of other malignant                            neoplasm of large intestine CPT copyright 2017 [employer]. All rights reserved. The codes documented in this report are preliminary and upon coder review may  be revised to meet current compliance requirements. Milus Banister, MD 12/23/2017 7:40:33 AM This report has been signed electronically. Number of Addenda: 0

## 2017-12-23 NOTE — Transfer of Care (Signed)
Immediate Anesthesia Transfer of Care Note  Patient: Amy Bentley  Procedure(s) Performed: COLONOSCOPY WITH PROPOFOL (N/A )  Patient Location: PACU and Endoscopy Unit  Anesthesia Type:MAC  Level of Consciousness: awake, alert , oriented and patient cooperative  Airway & Oxygen Therapy: Patient Spontanous Breathing and Patient connected to face mask  Post-op Assessment: Report given to RN and Post -op Vital signs reviewed and stable  Post vital signs: Reviewed and stable  Last Vitals:  Vitals Value Taken Time  BP    Temp    Pulse 89 12/23/2017  7:42 AM  Resp 11 12/23/2017  7:42 AM  SpO2 100 % 12/23/2017  7:42 AM  Vitals shown include unvalidated device data.  Last Pain:  Vitals:   12/23/17 0650  TempSrc: Oral  PainSc: 0-No pain         Complications: No apparent anesthesia complications

## 2017-12-23 NOTE — H&P (Signed)
HPI: This is a very pleasant 47 y woman   T4N0 sigmoid cancer (invading urinary bladder) s/p 09/2013 segmental colon resection and same time bladder surgery; neoadjuvant chemo completed 03/2014; due to complexity of case, mircoperforation she was not able to undergo pre-op colonoscopy.  Colonoscopy 07/2014 normal colo-colonic anastomosis at 25cm, no polyps or cancers.  Chief complaint is h/o colon cancer  ROS: complete GI ROS as described in HPI, all other review negative.  Constitutional:  No unintentional weight loss   Past Medical History:  Diagnosis Date  . Anemia   . Chronic kidney disease    hx UTIs  . colon ca dx'd 09/2013  . GERD (gastroesophageal reflux disease)   . Hyperlipidemia     Past Surgical History:  Procedure Laterality Date  . BREAST BIOPSY Right    stereo   . CESAREAN SECTION     x 2  . COLON RESECTION N/A 10/20/2013   Procedure: OPEN SIGMOID COLONECTOMY COLOVESICAL FISTULA REPAIR;  Surgeon: Stark Klein, MD;  Location: [facility];  Service: General;  Laterality: N/A;  . COLONOSCOPY  08/16/2014  . COLONOSCOPY WITH PROPOFOL N/A 08/16/2014   Procedure: COLONOSCOPY WITH PROPOFOL;  Surgeon: Milus Banister, MD;  Location: [facility];  Service: Endoscopy;  Laterality: N/A;  . CYSTECTOMY N/A 10/20/2013   Procedure: CYSTECTOMY PARTIAL;  Surgeon: Ardis Hughs, MD;  Location: [facility];  Service: Urology;  Laterality: N/A;  . PORTACATH PLACEMENT N/A 11/15/2013   Procedure: INSERTION PORT-A-CATH;  Surgeon: Stark Klein, MD;  Location: [facility];  Service: General;  Laterality: N/A;  . TUBAL LIGATION    . VENTRAL HERNIA REPAIR N/A 07/21/2017   Procedure: EXPLORATORY LAPAROTOMY AND LYSIS OF ADHESIONS WITH  REPAIR OF VENTRAL HERNIA WITH MESH ;  Surgeon: Excell Seltzer, MD;  Location: [facility];  Service: General;  Laterality: N/A;    Current Facility-Administered Medications  Medication Dose Route Frequency Provider Last Rate Last Dose  . 0.9 %  sodium chloride infusion    Intravenous Continuous Milus Banister, MD      . lactated ringers infusion   Intravenous Continuous Milus Banister, MD 10 mL/hr at 12/23/17 0701      Allergies as of 09/21/2017 - Review Complete 07/21/2017  Allergen Reaction Noted  . Bactrim [sulfamethoxazole-trimethoprim]  10/17/2013    Family History  Problem Relation Age of Onset  . Cancer Paternal Aunt 75       colon  . Colon cancer Paternal Aunt 4  . Hypertension Other   . Diabetes Other   . Esophageal cancer Neg Hx   . Rectal cancer Neg Hx   . Stomach cancer Neg Hx   . Breast cancer Neg Hx     Social History   Socioeconomic History  . Marital status: Married    Spouse name: Not on file  . Number of children: Not on file  . Years of education: Not on file  . Highest education level: Not on file  Occupational History  . Not on file  Social Needs  . Financial resource strain: Not on file  . Food insecurity:    Worry: Not on file    Inability: Not on file  . Transportation needs:    Medical: Not on file    Non-medical: Not on file  Tobacco Use  . Smoking status: Former Smoker    Packs/day: 0.50    Types: Cigarettes    Last attempt to quit: 10/17/2013    Years since quitting: 4.1  . Smokeless  tobacco: Never Used  Substance and Sexual Activity  . Alcohol use: No  . Drug use: No  . Sexual activity: Never  Lifestyle  . Physical activity:    Days per week: Not on file    Minutes per session: Not on file  . Stress: Not on file  Relationships  . Social connections:    Talks on phone: Not on file    Gets together: Not on file    Attends religious service: Not on file    Active member of club or organization: Not on file    Attends meetings of clubs or organizations: Not on file    Relationship status: Not on file  . Intimate partner violence:    Fear of current or ex partner: Not on file    Emotionally abused: Not on file    Physically abused: Not on file    Forced sexual activity: Not on file  Other  Topics Concern  . Not on file  Social History Narrative  . Not on file     Physical Exam: BP (!) 144/94   Pulse 84   Temp 97.9 F (36.6 C) (Oral)   Resp 12   Ht 5\' 4"  (1.626 m)   Wt (!) 319 lb (144.7 kg)   LMP 12/07/2017 (Exact Date)   SpO2 100%   BMI 54.76 kg/m  Constitutional: generally well-appearing Psychiatric: alert and oriented x3 Abdomen: soft, nontender, nondistended, no obvious ascites, no peritoneal signs, normal bowel sounds No peripheral edema noted in lower extremities  Assessment and plan: 47 y.o. female with h/o colon cancer   for colonoscopy today.  Please see the "Patient Instructions" section for addition details about the plan.  Owens Loffler, MD [facility] 12/23/2017, 7:13 AM

## 2017-12-23 NOTE — Discharge Instructions (Signed)

## 2017-12-23 NOTE — Anesthesia Preprocedure Evaluation (Signed)
Anesthesia Evaluation  Patient identified by MRN, date of birth, ID band Patient awake    Reviewed: Allergy & Precautions, NPO status , Patient's Chart, lab work & pertinent test results  Airway Mallampati: II  TM Distance: <3 FB Neck ROM: Full    Dental no notable dental hx.    Pulmonary neg pulmonary ROS, former smoker,    breath sounds clear to auscultation + decreased breath sounds      Cardiovascular negative cardio ROS Normal cardiovascular exam Rhythm:Regular Rate:Normal     Neuro/Psych negative neurological ROS  negative psych ROS   GI/Hepatic Neg liver ROS, GERD  ,  Endo/Other  Morbid obesity  Renal/GU negative Renal ROS  negative genitourinary   Musculoskeletal negative musculoskeletal ROS (+)   Abdominal (+) + obese,   Peds negative pediatric ROS (+)  Hematology negative hematology ROS (+)   Anesthesia Other Findings   Reproductive/Obstetrics negative OB ROS                             Anesthesia Physical Anesthesia Plan  ASA: III  Anesthesia Plan: MAC   Post-op Pain Management:    Induction: Intravenous  PONV Risk Score and Plan: 0  Airway Management Planned: Simple Face Mask  Additional Equipment:   Intra-op Plan:   Post-operative Plan:   Informed Consent: I have reviewed the patients History and Physical, chart, labs and discussed the procedure including the risks, benefits and alternatives for the proposed anesthesia with the patient or authorized representative who has indicated his/her understanding and acceptance.   Dental advisory given  Plan Discussed with: CRNA and Surgeon  Anesthesia Plan Comments:         Anesthesia Quick Evaluation

## 2017-12-23 NOTE — Anesthesia Procedure Notes (Signed)
Date/Time: 12/23/2017 7:24 AM Performed by: Claudia Desanctis, CRNA Oxygen Delivery Method: Simple face mask

## 2017-12-24 ENCOUNTER — Encounter (HOSPITAL_COMMUNITY): Payer: Self-pay | Admitting: Gastroenterology

## 2018-01-04 ENCOUNTER — Other Ambulatory Visit: Payer: Self-pay | Admitting: Obstetrics and Gynecology

## 2018-01-04 DIAGNOSIS — Z1231 Encounter for screening mammogram for malignant neoplasm of breast: Secondary | ICD-10-CM

## 2018-01-07 ENCOUNTER — Ambulatory Visit: Payer: BLUE CROSS/BLUE SHIELD

## 2018-01-07 ENCOUNTER — Ambulatory Visit
Admission: RE | Admit: 2018-01-07 | Discharge: 2018-01-07 | Disposition: A | Discharge status: Home / Self Care | Payer: BLUE CROSS/BLUE SHIELD | Source: Ambulatory Visit | Attending: Obstetrics and Gynecology | Admitting: Obstetrics and Gynecology

## 2018-01-07 DIAGNOSIS — Z1231 Encounter for screening mammogram for malignant neoplasm of breast: Secondary | ICD-10-CM | POA: Diagnosis not present

## 2018-05-19 ENCOUNTER — Inpatient Hospital Stay: Payer: Self-pay | Attending: Oncology

## 2018-05-19 ENCOUNTER — Telehealth: Payer: Self-pay

## 2018-05-19 ENCOUNTER — Inpatient Hospital Stay (HOSPITAL_BASED_OUTPATIENT_CLINIC_OR_DEPARTMENT_OTHER): Payer: Self-pay | Admitting: Oncology

## 2018-05-19 VITALS — BP 149/93 | HR 87 | Temp 98.9°F | Resp 19 | Ht 64.0 in | Wt 312.9 lb

## 2018-05-19 DIAGNOSIS — C187 Malignant neoplasm of sigmoid colon: Secondary | ICD-10-CM

## 2018-05-19 DIAGNOSIS — D509 Iron deficiency anemia, unspecified: Secondary | ICD-10-CM | POA: Insufficient documentation

## 2018-05-19 DIAGNOSIS — Z23 Encounter for immunization: Secondary | ICD-10-CM

## 2018-05-19 MED ORDER — INFLUENZA VAC SPLIT QUAD 0.5 ML IM SUSY
PREFILLED_SYRINGE | INTRAMUSCULAR | Status: AC
  Filled 2018-05-19: qty 0.5

## 2018-05-19 MED ORDER — INFLUENZA VAC SPLIT QUAD 0.5 ML IM SUSY
0.5000 mL | PREFILLED_SYRINGE | Freq: Once | INTRAMUSCULAR | Status: AC
  Administered 2018-05-19: 0.5 mL via INTRAMUSCULAR

## 2018-05-19 NOTE — Progress Notes (Signed)
[facility] OFFICE PROGRESS NOTE   Diagnosis: Colon cancer  INTERVAL HISTORY:   Amy Bentley returns as scheduled.  She feels well.  No difficulty with bowel function.  No bleeding.  Good appetite.  She underwent a colonoscopy 12/23/2017.  There was a normal anastomosis at 25 cm.  No polyps or cancers.  Objective:  Vital signs in last 24 hours:  Blood pressure (!) 149/93, pulse 87, temperature 98.9 F (37.2 C), temperature source Oral, resp. rate 19, height 5' 4" (1.626 m), weight (!) 312 lb 14.4 oz (141.9 kg), SpO2 100 %.    HEENT: Neck without mass Lymphatics: No cervical, supraclavicular, axillary, or inguinal nodes Resp: Lungs clear bilaterally Cardio: Regular rate and rhythm GI: No hepatosplenomegaly, no mass, nontender Vascular: Leg edema  Lab Results:    Lab Results  Component Value Date   CEA1 2.03 09/23/2017     Medications: I have reviewed the patient's current medications.   Assessment/Plan: 1. Stage IIC (T4b, N0), adenocarcinoma the sigmoid colon, status post a partial colectomy and en bloc bladder resection 10/20/2013, negative surgical margins, no preoperative colonoscopy. No loss of mismatch repair protein expression. Microsatellite stable.  Mildly elevated preoperative CEA.   CEA normal (1.0) on 11/09/2013.   Cycle 1 adjuvant FOLFOX 11/23/2013.   Cycle 2 adjuvant FOLFOX 12/07/2013.   Cycle 3 adjuvant FOLFOX 12/21/2013 (oxaliplatin dose reduced to 65 mg per meter squared due to neuropathy symptoms).   Cycle 4 adjuvant FOLFOX 01/04/2014.   Cycle 5 adjuvant FOLFOX 01/18/2014.   Cycle 6 adjuvant FOLFOX 02/01/2014-oxaliplatin discontinued prematurely secondary to an apparent hypersensitivity reaction.   Cycle 7 of adjuvant FOLFOX 02/15/2014.   Cycle 8 adjuvant FOLFOX 03/01/2014.   Cycle 9 adjuvant FOLFOX 03/15/2014. Oxaliplatin was held due to increased neuropathy symptoms  Cycle 10 FOLFOX 03/29/2014  Cycle 11  FOLFOX 04/12/2014-oxaliplatin discontinued  Cycle 12 FOLFOX 04/26/2014-oxaliplatin discontinued  Negative surveillance colonoscopy 08/16/2014  CTs of the chest, abdomen, and pelvis 10/01/2014-negative for metastatic disease  CTs of the chest, abdomen, and pelvis 10/11/2015-negative for recurrent disease  CTs of the chest, abdomen, and pelvis 10/21/2016-negative for recurrent disease  Negative surveillance colonoscopy 12/23/2017 2. history of microcytic anemia-likely iron deficiency anemia. She continues oral iron. 3. Right breast nodule on the chest CT 11/14/2013.  Diagnostic mammogram and ultrasound 12/26/2013 showed a probable fibroadenoma within the inner right breast.   She discussed management options with the radiologist including excision, biopsy and short interval followup. She elected short interval followup. A followup ultrasound is recommended at 6, 12 and 24 months to assess stability.  11/08/2014 Needle core biopsy right breast 3:00 showed fibroadenoma. No atypia or malignancy identified.  CT 10/21/2016-medial right breast mass is larger  Mammogram 12/25/2016 no findings suspicious for malignancy. 4. Port-A-Cath placement 11/15/2013. 5. Delayed nausea following cycle 1 FOLFOX. Aloxi and Emend added with cycle 2 with improvement. 6. 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 Oxaliplatin was dose reduced to 65 mg per meter squared beginning with cycle 3. Increased neuropathy symptoms with persistent numbness/tingling in both feet following cycle 8 FOLFOX. Oxaliplatin held with cycle 9. Neuropathy symptoms improved 03/29/2014. Oxaliplatin resumed with cycle 10. Oxaliplatin discontinued with cycles 11 and 12 FOLFOX secondary to persistent neuropathy symptoms. 7. Ocular pruritus. Most likely related to 5-fluorouracil. She did not experience ocular pruritus following cycle 5 FOLFOX.   8. Hypersensitivity reaction to oxaliplatin with cycle 6 FOLFOX-she was premedicated with Decadron and given additional steroid/antihistamine  premedication beginning with cycle 7 FOLFOX with no reaction. 9. Hospitalization 07/12/2017 through 07/26/2017 with a small bowel obstruction status post exploratory laparotomy and lysis of adhesions with repair of ventral hernia with mesh.      Disposition: [name] remains in clinical remission from colon cancer.  We will follow-up on the CEA from today.  She will return for an office visit and CEA in 6 months.  15 minutes were spent with the patient today.  The majority of the time was used for counseling and coordination of care.  Betsy Coder, MD  05/19/2018  3:35 PM

## 2018-05-19 NOTE — Telephone Encounter (Signed)
Printed avs and calender of upcoming appointment. Per 11/21 los 

## 2018-05-20 LAB — CEA (IN HOUSE-CHCC): CEA (CHCC-In House): 1.96 ng/mL (ref 0.00–5.00)

## 2018-11-15 ENCOUNTER — Telehealth: Payer: Self-pay | Admitting: Oncology

## 2018-11-15 NOTE — Telephone Encounter (Signed)
Per 5/19 schedule message moved 5/21 lab/fu to July. Confirmed with patient.

## 2018-11-17 ENCOUNTER — Inpatient Hospital Stay: Payer: BLUE CROSS/BLUE SHIELD

## 2018-11-17 ENCOUNTER — Ambulatory Visit: Payer: Self-pay | Admitting: Nurse Practitioner

## 2019-01-05 ENCOUNTER — Other Ambulatory Visit: Payer: Self-pay

## 2019-01-05 ENCOUNTER — Other Ambulatory Visit: Payer: Self-pay | Admitting: Nurse Practitioner

## 2019-01-05 ENCOUNTER — Inpatient Hospital Stay: Payer: BC Managed Care – PPO | Attending: Nurse Practitioner | Admitting: Nurse Practitioner

## 2019-01-05 ENCOUNTER — Inpatient Hospital Stay: Payer: BC Managed Care – PPO

## 2019-01-05 DIAGNOSIS — Z85038 Personal history of other malignant neoplasm of large intestine: Secondary | ICD-10-CM | POA: Insufficient documentation

## 2019-01-05 DIAGNOSIS — C187 Malignant neoplasm of sigmoid colon: Secondary | ICD-10-CM

## 2019-01-05 LAB — CEA (IN HOUSE-CHCC): CEA (CHCC-In House): 2.1 ng/mL (ref 0.00–5.00)

## 2019-01-05 NOTE — Progress Notes (Deleted)
[facility] OFFICE PROGRESS NOTE   Diagnosis: Colon cancer  INTERVAL HISTORY:   Amy Bentley returns as scheduled.  Objective:  Vital signs in last 24 hours:  There were no vitals taken for this visit.    HEENT: *** Lymphatics: *** Resp: *** Cardio: *** GI: *** Vascular: *** Neuro: ***  Skin: ***    Lab Results:  Lab Results  Component Value Date   WBC 8.0 07/24/2017   HGB 10.1 (L) 07/24/2017   HCT 31.1 (L) 07/24/2017   MCV 83.8 07/24/2017   PLT 177 07/24/2017   NEUTROABS 8.5 (H) 07/12/2017    Imaging:  No results found.  Medications: I have reviewed the patient's current medications.  Assessment/Plan: 1. Stage IIC (T4b, N0), adenocarcinoma the sigmoid colon, status post a partial colectomy and en bloc bladder resection 10/20/2013, negative surgical margins, no preoperative colonoscopy. No loss of mismatch repair protein expression. Microsatellite stable.  Mildly elevated preoperative CEA.   CEA normal (1.0) on 11/09/2013.   Cycle 1 adjuvant FOLFOX 11/23/2013.   Cycle 2 adjuvant FOLFOX 12/07/2013.   Cycle 3 adjuvant FOLFOX 12/21/2013 (oxaliplatin dose reduced to 65 mg per meter squared due to neuropathy symptoms).   Cycle 4 adjuvant FOLFOX 01/04/2014.   Cycle 5 adjuvant FOLFOX 01/18/2014.   Cycle 6 adjuvant FOLFOX 02/01/2014-oxaliplatin discontinued prematurely secondary to an apparent hypersensitivity reaction.   Cycle 7 of adjuvant FOLFOX 02/15/2014.   Cycle 8 adjuvant FOLFOX 03/01/2014.   Cycle 9 adjuvant FOLFOX 03/15/2014. Oxaliplatin was held due to increased neuropathy symptoms  Cycle 10 FOLFOX 03/29/2014  Cycle 11 FOLFOX 04/12/2014-oxaliplatin discontinued  Cycle 12 FOLFOX 04/26/2014-oxaliplatin discontinued  Negative surveillance colonoscopy 08/16/2014  CTs of the chest, abdomen, and pelvis 10/01/2014-negative for metastatic disease  CTs of the chest, abdomen, and pelvis 10/11/2015-negative for  recurrent disease  CTs of the chest, abdomen, and pelvis 10/21/2016-negative for recurrent disease  Negative surveillance colonoscopy 12/23/2017 2. history of microcytic anemia-likely iron deficiency anemia. She continues oral iron. 3. Right breast nodule on the chest CT 11/14/2013.  Diagnostic mammogram and ultrasound 12/26/2013 showed a probable fibroadenoma within the inner right breast.   She discussed management options with the radiologist including excision, biopsy and short interval followup. She elected short interval followup. A followup ultrasound is recommended at 6, 12 and 24 months to assess stability.  11/08/2014 Needle core biopsy right breast 3:00 showed fibroadenoma. No atypia or malignancy identified.  CT 10/21/2016-medial right breast mass is larger  Mammogram 12/25/2016 no findings suspicious for malignancy. 4. Port-A-Cath placement 11/15/2013. 5. Delayed nausea following cycle 1 FOLFOX. Aloxi and Emend added with cycle 2 with improvement. 6. 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 Oxaliplatin was dose reduced to 65 mg per meter squared beginning with cycle 3. Increased neuropathy symptoms with persistent numbness/tingling in both feet following cycle 8 FOLFOX. Oxaliplatin held with cycle 9. Neuropathy symptoms improved 03/29/2014. Oxaliplatin resumed with cycle 10. Oxaliplatin discontinued with cycles 11 and 12 FOLFOX secondary to persistent neuropathy symptoms. 7. Ocular pruritus. Most likely related to 5-fluorouracil. She did not experience ocular pruritus following cycle 5 FOLFOX.  8. Hypersensitivity reaction to oxaliplatin with cycle 6 FOLFOX-she was premedicated with Decadron and given additional steroid/antihistamine premedication beginning with cycle 7 FOLFOX with no reaction. 9. Hospitalization 07/12/2017 through 07/26/2017 with a small bowel obstruction status post exploratory  laparotomy and lysis of adhesions with repair of ventral hernia with mesh.   Disposition:    Ned Card  ANP/GNP-BC   01/05/2019  12:03 PM

## 2019-01-06 ENCOUNTER — Telehealth: Payer: Self-pay | Admitting: Nurse Practitioner

## 2019-01-06 NOTE — Telephone Encounter (Signed)
Scheduled appt per 7/09 sch message- unable to reach pt . Left message with appt date and time   

## 2019-01-18 ENCOUNTER — Encounter: Payer: Self-pay | Admitting: Nurse Practitioner

## 2019-01-18 ENCOUNTER — Inpatient Hospital Stay (HOSPITAL_BASED_OUTPATIENT_CLINIC_OR_DEPARTMENT_OTHER): Payer: BC Managed Care – PPO | Admitting: Nurse Practitioner

## 2019-01-18 ENCOUNTER — Other Ambulatory Visit: Payer: Self-pay

## 2019-01-18 VITALS — BP 151/106 | HR 103 | Temp 98.3°F | Resp 18 | Ht 64.0 in | Wt 309.5 lb

## 2019-01-18 DIAGNOSIS — Z85038 Personal history of other malignant neoplasm of large intestine: Secondary | ICD-10-CM

## 2019-01-18 DIAGNOSIS — C187 Malignant neoplasm of sigmoid colon: Secondary | ICD-10-CM

## 2019-01-18 NOTE — Progress Notes (Signed)
[facility] OFFICE PROGRESS NOTE   Diagnosis: Colon cancer  INTERVAL HISTORY:   Amy Bentley returns as scheduled.  No change in bowel habits.  No bleeding or pain with bowel movements.  Recent mild intermittent discomfort at the upper abdomen.  She thinks she may have a hernia.  Appetite has been diminished for the past few months.  She feels this is related to her husband's unexpected death 2 months ago.  Objective:  Vital signs in last 24 hours:  Blood pressure (!) 151/106, pulse (!) 103, temperature 98.3 F (36.8 C), temperature source Oral, resp. rate 18, height _0  (1.626 m), weight (!) 309 lb 8 oz (140.4 kg), SpO2 96 %.    HEENT: Neck without mass. Lymphatics: No palpable cervical, supraclavicular, axillary or inguinal lymph nodes. Resp: Lungs clear bilaterally. Cardio: Regular rate and rhythm. GI: Abdomen soft and nontender.  No hepatomegaly.  No mass.  Apparent hernia at the upper mid abdomen.  Soft, reducible. Vascular: No leg edema.  Lab Results:  Lab Results  Component Value Date   WBC 8.0 07/24/2017   HGB 10.1 (L) 07/24/2017   HCT 31.1 (L) 07/24/2017   MCV 83.8 07/24/2017   PLT 177 07/24/2017   NEUTROABS 8.5 (H) 07/12/2017    Imaging:  No results found.  Medications: I have reviewed the patient's current medications.  Assessment/Plan: 1. Stage IIC (T4b, N0), adenocarcinoma the sigmoid colon, status post a partial colectomy and en bloc bladder resection 10/20/2013, negative surgical margins, no preoperative colonoscopy. No loss of mismatch repair protein expression. Microsatellite stable.  Mildly elevated preoperative CEA.   CEA normal (1.0) on 11/09/2013.   Cycle 1 adjuvant FOLFOX 11/23/2013.   Cycle 2 adjuvant FOLFOX 12/07/2013.   Cycle 3 adjuvant FOLFOX 12/21/2013 (oxaliplatin dose reduced to 65 mg per meter squared due to neuropathy symptoms).   Cycle 4 adjuvant FOLFOX 01/04/2014.   Cycle 5 adjuvant FOLFOX 01/18/2014.    Cycle 6 adjuvant FOLFOX 02/01/2014-oxaliplatin discontinued prematurely secondary to an apparent hypersensitivity reaction.   Cycle 7 of adjuvant FOLFOX 02/15/2014.   Cycle 8 adjuvant FOLFOX 03/01/2014.   Cycle 9 adjuvant FOLFOX 03/15/2014. Oxaliplatin was held due to increased neuropathy symptoms  Cycle 10 FOLFOX 03/29/2014  Cycle 11 FOLFOX 04/12/2014-oxaliplatin discontinued  Cycle 12 FOLFOX 04/26/2014-oxaliplatin discontinued  Negative surveillance colonoscopy 08/16/2014  CTs of the chest, abdomen, and pelvis 10/01/2014-negative for metastatic disease  CTs of the chest, abdomen, and pelvis 10/11/2015-negative for recurrent disease  CTs of the chest, abdomen, and pelvis 10/21/2016-negative for recurrent disease  Negative surveillance colonoscopy 12/23/2017 2. history of microcytic anemia-likely iron deficiency anemia. She continues oral iron. 3. Right breast nodule on the chest CT 11/14/2013.  Diagnostic mammogram and ultrasound 12/26/2013 showed a probable fibroadenoma within the inner right breast.   She discussed management options with the radiologist including excision, biopsy and short interval followup. She elected short interval followup. A followup ultrasound is recommended at 6, 12 and 24 months to assess stability.  11/08/2014 Needle core biopsy right breast 3:00 showed fibroadenoma. No atypia or malignancy identified.  CT 10/21/2016-medial right breast mass is larger  Mammogram 12/25/2016 no findings suspicious for malignancy. 4. Port-A-Cath placement 11/15/2013. 5. Delayed nausea following cycle 1 FOLFOX. Aloxi and Emend added with cycle 2 with improvement. 6. 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 Oxaliplatin was dose reduced to 65 mg per meter squared beginning with cycle 3. Increased neuropathy symptoms with persistent numbness/tingling in both  feet following cycle 8  FOLFOX. Oxaliplatin held with cycle 9. Neuropathy symptoms improved 03/29/2014. Oxaliplatin resumed with cycle 10. Oxaliplatin discontinued with cycles 11 and 12 FOLFOX secondary to persistent neuropathy symptoms. 7. Ocular pruritus. Most likely related to 5-fluorouracil. She did not experience ocular pruritus following cycle 5 FOLFOX.  8. Hypersensitivity reaction to oxaliplatin with cycle 6 FOLFOX-she was premedicated with Decadron and given additional steroid/antihistamine premedication beginning with cycle 7 FOLFOX with no reaction. 9. Hospitalization 07/12/2017 through 07/26/2017 with a small bowel obstruction status post exploratory laparotomy and lysis of adhesions with repair of ventral hernia with mesh.   Disposition: Amy Bentley remains in clinical remission from colon cancer.  The recent CEA was stable in the normal range.  She is now a little over 5 years out from diagnosis.  She would like to continue [facility] at the [facility].  We made a referral to surgery to evaluate the abdominal hernia.  She will return for a CEA and follow-up visit in 1 year.  She will contact the office in the interim with any problems.  Patient seen with Dr. Benay Spice.    Ned Card ANP/GNP-BC   01/18/2019  10:17 AM  This was a shared visit with Ned Card.  Amy Bentley will follow-up with surgery to evaluate the apparent abdominal hernia.  She would like to continue follow-up at the Cancer center.  Julieanne Manson, MD

## 2019-01-19 ENCOUNTER — Telehealth: Payer: Self-pay | Admitting: Nurse Practitioner

## 2019-01-19 NOTE — Telephone Encounter (Signed)
Scheduled per los. Mailed printout  °

## 2019-03-30 ENCOUNTER — Other Ambulatory Visit: Payer: Self-pay | Admitting: Oncology

## 2019-03-30 DIAGNOSIS — Z1231 Encounter for screening mammogram for malignant neoplasm of breast: Secondary | ICD-10-CM

## 2019-04-21 DIAGNOSIS — Z6841 Body Mass Index (BMI) 40.0 and over, adult: Secondary | ICD-10-CM | POA: Diagnosis not present

## 2019-04-21 DIAGNOSIS — Z13220 Encounter for screening for lipoid disorders: Secondary | ICD-10-CM | POA: Diagnosis not present

## 2019-04-21 DIAGNOSIS — Z862 Personal history of diseases of the blood and blood-forming organs and certain disorders involving the immune mechanism: Secondary | ICD-10-CM | POA: Diagnosis not present

## 2019-04-21 DIAGNOSIS — D649 Anemia, unspecified: Secondary | ICD-10-CM | POA: Diagnosis not present

## 2019-04-21 DIAGNOSIS — Z833 Family history of diabetes mellitus: Secondary | ICD-10-CM | POA: Diagnosis not present

## 2019-04-21 DIAGNOSIS — Z23 Encounter for immunization: Secondary | ICD-10-CM | POA: Diagnosis not present

## 2019-04-21 DIAGNOSIS — K219 Gastro-esophageal reflux disease without esophagitis: Secondary | ICD-10-CM | POA: Diagnosis not present

## 2019-04-28 ENCOUNTER — Other Ambulatory Visit (HOSPITAL_COMMUNITY): Payer: Self-pay | Admitting: Surgery

## 2019-04-28 ENCOUNTER — Other Ambulatory Visit: Payer: Self-pay | Admitting: Surgery

## 2019-05-15 ENCOUNTER — Ambulatory Visit
Admission: RE | Admit: 2019-05-15 | Discharge: 2019-05-15 | Disposition: A | Discharge status: Home / Self Care | Payer: BC Managed Care – PPO | Source: Ambulatory Visit | Attending: Oncology | Admitting: Oncology

## 2019-05-15 ENCOUNTER — Other Ambulatory Visit: Payer: Self-pay

## 2019-05-15 DIAGNOSIS — Z1231 Encounter for screening mammogram for malignant neoplasm of breast: Secondary | ICD-10-CM | POA: Diagnosis not present

## 2019-07-03 ENCOUNTER — Other Ambulatory Visit (HOSPITAL_COMMUNITY): Payer: Self-pay | Admitting: Surgery

## 2019-07-03 ENCOUNTER — Other Ambulatory Visit: Payer: Self-pay

## 2019-07-03 ENCOUNTER — Ambulatory Visit (HOSPITAL_COMMUNITY)
Admission: RE | Admit: 2019-07-03 | Discharge: 2019-07-03 | Disposition: A | Discharge status: Home / Self Care | Payer: BC Managed Care – PPO | Source: Ambulatory Visit | Attending: Surgery | Admitting: Surgery

## 2019-07-03 DIAGNOSIS — Z01818 Encounter for other preprocedural examination: Secondary | ICD-10-CM | POA: Diagnosis not present

## 2019-07-03 DIAGNOSIS — E669 Obesity, unspecified: Secondary | ICD-10-CM | POA: Diagnosis not present

## 2019-07-05 ENCOUNTER — Encounter: Payer: BC Managed Care – PPO | Attending: Surgery | Admitting: Dietician

## 2019-07-05 ENCOUNTER — Encounter: Payer: Self-pay | Admitting: Dietician

## 2019-07-05 ENCOUNTER — Other Ambulatory Visit: Payer: Self-pay

## 2019-07-05 DIAGNOSIS — E669 Obesity, unspecified: Secondary | ICD-10-CM | POA: Insufficient documentation

## 2019-07-05 NOTE — Patient Instructions (Signed)
Begin working through the list of Pre-Op Goals we discussed today, starting with 1 or 2 of what you expect to be the most difficult. Please contact us in the meantime with questions or concerns, and we will see you at Pre-Op Class!

## 2019-07-05 NOTE — Progress Notes (Signed)
Nutrition Assessment for Bariatric Surgery Medical Nutrition Therapy  Appt Start Time: 7:30am    End Time: 8:15am  Patient was seen on 07/05/2019 for Pre-Operative Nutrition Assessment. Letter of approval faxed to [facility] bariatric surgery program coordinator on 07/05/2019.   Referral stated Supervised Weight Loss (SWL) visits needed: 0  Planned surgery: Sleeve Gastrectomy  Pt expectation of surgery: to lose weight, relieve joint pain, prevent hernias from recurring    NUTRITION ASSESSMENT   Anthropometrics  Start weight at [facility]: 321 lbs (date: 07/05/2019) Height: 61 in BMI: 60.7 kg/m2    Clinical  Medical hx: obesity, hyperlipidemia, CKD, anemia, colon cancer, GERD   Lifestyle & Dietary Hx Typical meal pattern is 1 main meal plus several snacks. Snacks include pork rinds, pickles, chips, pepperoni and cheese, and Herbalife meal replacement shakes.  Takes MVI, vitamin D, and prenatal, vitamin B12, iron. Former smoker, quit in 2015.   24-Hr Dietary Recall First Meal: 4am- smoked sausage + Dr. Malachi Bonds Snack: 10am- sandwich  Second Meal: 2pm- Chinese food  Snack: 4pm- pepperoni + cheese  Third Meal: - Snack: - Beverages: water, Dr. Malachi Bonds  Estimated Energy Needs Calories: 1600 Carbohydrate: 180g Protein: 100g Fat: 53g   NUTRITION DIAGNOSIS  Overweight/obesity ([place]-3.3) related to past poor dietary habits and physical inactivity as evidenced by patient w/ planned Sleeve Gastrectomy surgery following dietary guidelines for continued weight loss.    NUTRITION INTERVENTION  Nutrition counseling (C-1) and education (E-2) to facilitate bariatric surgery goals.  Pre-Op Goals Reviewed with the Patient . Track food and beverage intake (pen and paper, MyFitness Pal, Baritastic app, etc.) . Make healthy food choices while monitoring portion sizes . Consume 3 meals per day or try to eat every 3-5 hours . Avoid concentrated sugars and fried foods . Keep sugar & fat in  the single digits per serving on food labels . Practice CHEWING your food (aim for applesauce consistency) . Practice not drinking 15 minutes before, during, and 30 minutes after each meal and snack . Avoid all carbonated beverages (ex: soda, sparkling beverages)  . Limit caffeinated beverages (ex: coffee, tea, energy drinks) . Avoid all sugar-sweetened beverages (ex: regular soda, sports drinks)  . Avoid alcohol  . Aim for 64-100 ounces of FLUID daily (with at least half of fluid intake being plain water)  . Aim for at least 60-80 grams of PROTEIN daily . Look for a liquid protein source that contains ?15 g protein and ?5 g carbohydrate (ex: shakes, drinks, shots) . Make a list of non-food related activities . Physical activity is an important part of a healthy lifestyle so keep it moving! The goal is to reach 150 minutes of exercise per week, including cardiovascular and weight baring activity.  Handouts Provided Include  . Bariatric Surgery handouts (Nutrition Visits, Pre-Op Goals, Protein Shakes, Vitamins & Minerals)  Learning Style & Readiness for Change Teaching method utilized: Visual & Auditory  Demonstrated degree of understanding via: Teach Back    MONITORING & EVALUATION Dietary intake, weekly physical activity, body weight, and pre-op goals reached at next nutrition visit.   Next Steps Patient is to follow up at [facility] for Pre-Op Class (>2 weeks before surgery) for further nutrition education.

## 2019-07-10 ENCOUNTER — Ambulatory Visit (INDEPENDENT_AMBULATORY_CARE_PROVIDER_SITE_OTHER): Payer: BC Managed Care – PPO | Admitting: Psychology

## 2019-07-10 DIAGNOSIS — F509 Eating disorder, unspecified: Secondary | ICD-10-CM

## 2019-07-11 ENCOUNTER — Ambulatory Visit: Payer: BC Managed Care – PPO | Admitting: Interventional Cardiology

## 2019-07-13 DIAGNOSIS — F4320 Adjustment disorder, unspecified: Secondary | ICD-10-CM | POA: Diagnosis not present

## 2019-07-18 DIAGNOSIS — F4320 Adjustment disorder, unspecified: Secondary | ICD-10-CM | POA: Diagnosis not present

## 2019-07-21 ENCOUNTER — Ambulatory Visit: Payer: BC Managed Care – PPO | Admitting: Psychology

## 2019-07-23 DIAGNOSIS — F339 Major depressive disorder, recurrent, unspecified: Secondary | ICD-10-CM | POA: Diagnosis not present

## 2019-07-25 DIAGNOSIS — F339 Major depressive disorder, recurrent, unspecified: Secondary | ICD-10-CM | POA: Diagnosis not present

## 2019-08-03 DIAGNOSIS — F339 Major depressive disorder, recurrent, unspecified: Secondary | ICD-10-CM | POA: Diagnosis not present

## 2019-08-08 ENCOUNTER — Ambulatory Visit: Payer: BC Managed Care – PPO | Admitting: Interventional Cardiology

## 2019-08-14 ENCOUNTER — Encounter: Payer: Self-pay | Admitting: General Practice

## 2019-08-30 ENCOUNTER — Ambulatory Visit (INDEPENDENT_AMBULATORY_CARE_PROVIDER_SITE_OTHER): Payer: BC Managed Care – PPO | Admitting: Psychology

## 2019-08-30 DIAGNOSIS — F509 Eating disorder, unspecified: Secondary | ICD-10-CM | POA: Diagnosis not present

## 2019-09-18 ENCOUNTER — Other Ambulatory Visit: Payer: Self-pay

## 2019-09-18 ENCOUNTER — Encounter: Payer: Self-pay | Admitting: Interventional Cardiology

## 2019-09-18 ENCOUNTER — Ambulatory Visit (INDEPENDENT_AMBULATORY_CARE_PROVIDER_SITE_OTHER): Payer: BC Managed Care – PPO | Admitting: Interventional Cardiology

## 2019-09-18 VITALS — BP 138/102 | HR 100 | Ht 61.0 in | Wt 325.6 lb

## 2019-09-18 DIAGNOSIS — E782 Mixed hyperlipidemia: Secondary | ICD-10-CM

## 2019-09-18 DIAGNOSIS — Z01810 Encounter for preprocedural cardiovascular examination: Secondary | ICD-10-CM | POA: Diagnosis not present

## 2019-09-18 NOTE — Patient Instructions (Signed)
Medication Instructions:  Your physician recommends that you continue on your current medications as directed. Please refer to the Current Medication list given to you today.  *If you need a refill on your cardiac medications before your next appointment, please call your pharmacy*   Lab Work: None ordered  If you have labs (blood work) drawn today and your tests are completely normal, you will receive your results only by: . MyChart Message (if you have MyChart) OR . A paper copy in the mail If you have any lab test that is abnormal or we need to change your treatment, we will call you to review the results.   Testing/Procedures: None ordered   Follow-Up: AS NEEDED   Other Instructions   

## 2019-09-18 NOTE — Progress Notes (Signed)
Cardiology Office Note   Date:  09/18/2019   ID:  Amadis, Fuhrer 02/13/1971, MRN [id number]  PCP:  Leighton Ruff, MD    No chief complaint on file.  Morbid obesity/preoperative cardiovascular exam  Wt Readings from Last 3 Encounters:  09/18/19 (!) 325 lb 9.6 oz (147.7 kg)  07/05/19 (!) 321 lb (145.6 kg)  01/18/19 (!) 309 lb 8 oz (140.4 kg)       History of Present Illness: Amy Bentley is a 49 y.o. female who is being seen today for the evaluation of preop eval for weight loss surgery at the request of Clovis Riley, MD.  She has not had cardiac issues in the past.    She is still walking over 150 minutes/week.  She walks outside.  No cardiac sx walking up hills.    No family h/o early cardiac issues.    No tobacco use in 6 years.   She has had some high cholesterol.  No meds.   Denies : Chest pain. Dizziness. Leg edema. Nitroglycerin use. Orthopnea. Palpitations. Paroxysmal nocturnal dyspnea. Shortness of breath. Syncope.     Past Medical History:  Diagnosis Date  . Anemia   . Chronic kidney disease    hx UTIs  . colon ca dx'd 09/2013  . GERD (gastroesophageal reflux disease)   . Hyperlipidemia     Past Surgical History:  Procedure Laterality Date  . BREAST BIOPSY Right    stereo   . CESAREAN SECTION     x 2  . COLON RESECTION N/A 10/20/2013   Procedure: OPEN SIGMOID COLONECTOMY COLOVESICAL FISTULA REPAIR;  Surgeon: Stark Klein, MD;  Location: [facility];  Service: General;  Laterality: N/A;  . COLONOSCOPY  08/16/2014  . COLONOSCOPY WITH PROPOFOL N/A 08/16/2014   Procedure: COLONOSCOPY WITH PROPOFOL;  Surgeon: Milus Banister, MD;  [facility]: [facility];  Service: Endoscopy;  Laterality: N/A;  . COLONOSCOPY WITH PROPOFOL N/A 12/23/2017   Procedure: COLONOSCOPY WITH PROPOFOL;  Surgeon: Milus Banister, MD;  [facility]: [facility];  Service: Endoscopy;  Laterality: N/A;  . CYSTECTOMY N/A 10/20/2013   Procedure: CYSTECTOMY  PARTIAL;  Surgeon: Ardis Hughs, MD;  Location: [facility];  Service: Urology;  Laterality: N/A;  . PORTACATH PLACEMENT N/A 11/15/2013   Procedure: INSERTION PORT-A-CATH;  Surgeon: Stark Klein, MD;  Location: [facility];  Service: General;  Laterality: N/A;  . TUBAL LIGATION    . VENTRAL HERNIA REPAIR N/A 07/21/2017   Procedure: EXPLORATORY LAPAROTOMY AND LYSIS OF ADHESIONS WITH  REPAIR OF VENTRAL HERNIA WITH MESH ;  Surgeon: Excell Seltzer, MD;  Location: [facility];  Service: General;  Laterality: N/A;     Current Outpatient Medications  Medication Sig Dispense Refill  . diphenhydrAMINE (BENADRYL) 25 MG tablet Take 25 mg by mouth daily as needed for allergies.    Marland Kitchen ibuprofen (ADVIL,MOTRIN) 200 MG tablet Take 600 mg by mouth 2 (two) times daily as needed for headache, mild pain or moderate pain.      No current facility-administered medications for this visit.    Allergies:   Bactrim [sulfamethoxazole-trimethoprim]    Social History:  The patient  reports that she quit smoking about 5 years ago. Her smoking use included cigarettes. She smoked 0.50 packs per day. She has never used smokeless tobacco. She reports that she does not drink alcohol or use drugs.   Family History:  The patient's family history includes Cancer (age of onset: 84) in her paternal aunt; Colon cancer (age  of onset: 60) in her paternal aunt; Diabetes in an other family member; Hypertension in an other family member.    ROS:  Please see the history of present illness.   Otherwise, review of systems are positive for difficulty losing weight.   All other systems are reviewed and negative.    PHYSICAL EXAM: VS:  BP (!) 138/102   Pulse 100   Ht 5\' 1"  (1.549 m)   Wt (!) 325 lb 9.6 oz (147.7 kg)   SpO2 97%   BMI 61.52 kg/m  , BMI Body mass index is 61.52 kg/m. GEN: Well nourished, well developed, in no acute distress  HEENT: normal  Neck: no JVD, carotid bruits, or masses Cardiac: RRR; no murmurs, rubs, or  gallops,no edema  Respiratory:  clear to auscultation bilaterally, normal work of breathing GI: soft, nontender, nondistended, + BS, obese MS: no deformity or atrophy  Skin: warm and dry, no rash Neuro:  Strength and sensation are intact Psych: euthymic mood, full affect   EKG:   The ekg ordered in January 2021 demonstrates normal ECG   Recent Labs: No results found for requested labs within last 8760 hours.   Lipid Panel No results found for: CHOL, TRIG, HDL, CHOLHDL, VLDL, LDLCALC, LDLDIRECT   Other studies Reviewed: Additional studies/ records that were reviewed today with results demonstrating: PMD labs reviewed.   ASSESSMENT AND PLAN:  1. Preoperative cardiovascular eval: No further cardiac testing needed.  She clearly achieves 4 METS of activity.  No cardiac symptoms.  No prior cardiac history. 2. Hyperlipidemia: LDL 188 at last check.  She may need a lipid-lowering agent in the future.  Would not start now since she will be having her weight loss surgery in the near future.  If her LDL is still elevated 8 weeks after her weight loss surgery, would start low-dose atorvastatin. 3. Morbid obesity: I congratulated her on pursuing weight loss surgery.  I think this will help her avoid cardiac issues in the future.    Current medicines are reviewed at length with the patient today.  The patient concerns regarding her medicines were addressed.  The following changes have been made:  No change  Labs/ tests ordered today include:  No orders of the defined types were placed in this encounter.   Recommend 150 minutes/week of aerobic exercise Low fat, low carb, high fiber diet recommended  Disposition:   FU in as needed   Signed, Larae Grooms, MD  09/18/2019 11:19 AM    [address] [employer], [place], [place]  [address] Phone: [phone]; Fax: [phone]

## 2019-10-06 ENCOUNTER — Ambulatory Visit: Payer: Self-pay | Admitting: Surgery

## 2019-10-06 NOTE — H&P (Signed)
Surgical Evaluation  Chief Complaint: obesity  HPI: returns to discuss surgical management of severe obesity.  She has completed the preoperative pathway with no barriers identified.  She reports no changes in her health since we last met in October.    Chest x-ray and upper GI negative/no HH She has met with the dietitians as well as psychology and is approved from their standpoint. She has also been evaluated by cardiology (Dr. Irish Lack) and has been cleared from their standpoint as well Labs from March reviewed  Initial Visit 04/27/19: This is a very pleasant 49 year old woman who presents today with 2 concerns, one is recurrent incisional hernia and the other is morbid obesity. She underwent an open colon resection in 2015 with Dr. [name] for colon cancer.  This involved a partial cystectomy as she had developed a colovesical fistula. She subsequently had chemotherapy, has maintained colonoscopies and is currently as far she knows with no evidence of recurrence.  In January 2019 she developed an incarcerated ventral hernia which Dr. Excell Seltzer repaired- he did a retrorectus repair with phasix mesh, secured with Prolenes, fascia closed with PDS.  That hospitalization was complicated by acute renal failure with a creatinine as high as 12, from which she ultimately recovered. She has been noticing a bulge in her epigastrium when she coughs as well as concern of a hernia at the lower aspect of her incision.  The upper hernia is uncomfortable at times.  Given her difficult course with the previous hernia from which she was told to wait until his problem, she is hoping to avoid going through that again.  Other abdominal surgeries include tubal ligation and C-section.  She is also interested in surgical management of morbid obesity.  She has been struggling with this disease for her entire life.  She has tried numerous diets and weight loss programs without significant success.  She has been considering  bariatric surgery for several years. Endorses occasional reflux related to certain foods, but no other chronic GI symptoms.  She is interested in the sleeve gastrectomy and has done a fair amount of research about this.  She endorses arthralgias.  She has a history of hyperlipidemia, chronic kidney disease, anemia.  Denies alcohol or drug use, quit smoking 5 years ago. 309lb/ BMI 58.4  Discussed that hernia repairs best deferred until significant weight loss has been achieved to reduce the risk of recurrence. She has pain and subjective bulge in the abdominal wall but on exam and not able to palpate any discrete hernia. She is strongly interested in and is an excellent candidate for the bariatric pathway and I discussed with her sleeve gastrectomy. We discussed the surgery including technical aspects, the risks of bleeding, infection, pain, scarring, injury to intra-abdominal structures, staple line leak or abscess, chronic abdominal pain or nausea, new onset or worsened GERD, DVT/PE, pneumonia, heart attack, stroke, death, failure to reach weight loss goals and weight regain, hernia. Discussed the typical pre-, peri-, and postoperative course. Discussed the importance of lifelong behavioral changes to combat the chronic and relapsing disease which is obesity. Questions were welcomed and answered to her satisfaction. We will initiate the bariatric pathway she is scheduled to attend the seminar next week and has already completed the majority of the necessary paperwork which she brought with her today. We'll see if our bariatric coordinator's are able to meet with her today as well  Allergies  Allergen Reactions   Bactrim [Sulfamethoxazole-Trimethoprim] Other (See Comments)    Causes disorientation, inability to  speak    Past Medical History:  Diagnosis Date   Anemia    Chronic kidney disease    hx UTIs   colon ca dx'd 09/2013   GERD (gastroesophageal reflux disease)    Hyperlipidemia     Past  Surgical History:  Procedure Laterality Date   BREAST BIOPSY Right    stereo    CESAREAN SECTION     x 2   COLON RESECTION N/A 10/20/2013   Procedure: OPEN SIGMOID COLONECTOMY COLOVESICAL FISTULA REPAIR;  Surgeon: Stark Klein, MD;  [facility];  Service: General;  Laterality: N/A;   COLONOSCOPY  08/16/2014   COLONOSCOPY WITH PROPOFOL N/A 08/16/2014   Procedure: COLONOSCOPY WITH PROPOFOL;  Surgeon: Milus Banister, MD;  [facility];  Service: Endoscopy;  Laterality: N/A;   COLONOSCOPY WITH PROPOFOL N/A 12/23/2017   Procedure: COLONOSCOPY WITH PROPOFOL;  Surgeon: Milus Banister, MD;  [facility];  Service: Endoscopy;  Laterality: N/A;   CYSTECTOMY N/A 10/20/2013   Procedure: CYSTECTOMY PARTIAL;  Surgeon: Ardis Hughs, MD;  [facility]: [facility];  Service: Urology;  Laterality: N/A;   PORTACATH PLACEMENT N/A 11/15/2013   Procedure: INSERTION PORT-A-CATH;  Surgeon: Stark Klein, MD;  Location: [facility];  Service: General;  Laterality: N/A;   TUBAL LIGATION     VENTRAL HERNIA REPAIR N/A 07/21/2017   Procedure: EXPLORATORY LAPAROTOMY AND LYSIS OF ADHESIONS WITH  REPAIR OF VENTRAL HERNIA WITH MESH ;  Surgeon: Excell Seltzer, MD;  [facility]: [facility];  Service: General;  Laterality: N/A;    Family History  Problem Relation Age of Onset   Cancer Paternal Aunt 38       colon   Colon cancer Paternal Aunt 34   Hypertension Other    Diabetes Other    Esophageal cancer Neg Hx    Rectal cancer Neg Hx    Stomach cancer Neg Hx    Breast cancer Neg Hx     Social History   Socioeconomic History   Marital status: Married    Spouse name: Not on file   Number of children: Not on file   Years of education: Not on file   Highest education level: Not on file  Occupational History   Not on file  Tobacco Use   Smoking status: Former Smoker    Packs/day: 0.50    Types: Cigarettes    Quit date: 10/17/2013    Years since quitting: 5.9   Smokeless tobacco: Never Used   Substance and Sexual Activity   Alcohol use: No   Drug use: No   Sexual activity: Never  Other Topics Concern   Not on file  Social History Narrative   Not on file   Social Determinants of Health   Financial Resource Strain:    Difficulty of Paying Living Expenses:   Food Insecurity:    Worried About Charity fundraiser in the Last Year:    Arboriculturist in the Last Year:   Transportation Needs:    Film/video editor (Medical):    Lack of Transportation (Non-Medical):   Physical Activity:    Days of Exercise per Week:    Minutes of Exercise per Session:   Stress:    Feeling of Stress :   Social Connections:    Frequency of Communication with Friends and Family:    Frequency of Social Gatherings with Friends and Family:    Attends Religious Services:    Active Member of Clubs or Organizations:  Attends Archivist Meetings:    Marital Status:     Current Outpatient Medications on File Prior to Visit  Medication Sig Dispense Refill   diphenhydrAMINE (BENADRYL) 25 MG tablet Take 25 mg by mouth daily as needed for allergies.     ibuprofen (ADVIL,MOTRIN) 200 MG tablet Take 600 mg by mouth 2 (two) times daily as needed for headache, mild pain or moderate pain.      No current facility-administered medications on file prior to visit.    Review of Systems: a complete, 10pt review of systems was completed with pertinent positives and negatives as documented in the HPI  Physical Exam: Weight: 321.13 lb   Height: 61 in  Body Surface Area: 2.31 m   Body Mass Index: 60.68 kg/m   Temp.: 97.6 F (Oral)    Pulse: 106 (Regular)    BP: 156/96(Sitting, Left Arm, Standard)  Alert and well appearing unlabored respirations   CBC Latest Ref Rng & Units 07/24/2017 07/23/2017 07/22/2017  WBC 4.0 - 10.5 K/uL 8.0 10.4 12.6(H)  Hemoglobin 12.0 - 15.0 g/dL 10.1(L) 10.4(L) 12.8  Hematocrit 36.0 - 46.0 % 31.1(L) 32.4(L) 37.7  Platelets 150 - 400 K/uL 177 167 199    CMP  Latest Ref Rng & Units 07/25/2017 07/24/2017 07/23/2017  Glucose 65 - 99 mg/dL 121(H) 106(H) 130(H)  BUN 6 - 20 mg/dL 18 26(H) 48(H)  Creatinine 0.44 - 1.00 mg/dL 1.15(H) 1.65(H) 4.11(H)  Sodium 135 - 145 mmol/L 140 134(L) 140  Potassium 3.5 - 5.1 mmol/L 3.9 3.4(L) 3.3(L)  Chloride 101 - 111 mmol/L 109 101 100(L)  CO2 22 - 32 mmol/L '25 26 28  '$ Calcium 8.9 - 10.3 mg/dL 8.0(L) 7.7(L) 7.4(L)  Total Protein 6.5 - 8.1 g/dL - - -  Total Bilirubin 0.3 - 1.2 mg/dL - - -  Alkaline Phos 38 - 126 U/L - - -  AST 15 - 41 U/L - - -  ALT 14 - 54 U/L - - -    Lab Results  Component Value Date   INR 1.04 08/07/2013    Imaging: No results found.   A/P: MORBID OBESITY (E66.01) Story: She remains a good candidate for sleeve gastrectomy. We had previously discussed the surgery including technical aspects, the risks of bleeding, infection, pain, scarring, injury to intra-abdominal structures, staple line leak or abscess, chronic abdominal pain or nausea, new onset or worsened GERD, DVT/PE, pneumonia, heart attack, stroke, death, failure to reach weight loss goals and weight regain, hernia. Discussed again today the typical peri-, and postoperative course. Discussed the importance of lifelong behavioral changes to combat the chronic and relapsing disease which is obesity. Questions were welcomed and answered to her satisfaction. She is ready to proceed next week.    Patient Active Problem List   Diagnosis Date Noted   History of colon cancer    S/P laparoscopic procedure 07/21/2017   Incisional hernia, reducible 07/16/2017   Nausea and vomiting 07/12/2017   Malignant neoplasm of sigmoid colon ([place]) 03/13/2014   Family history of malignant neoplasm of gastrointestinal tract 03/13/2014   Cancer of sigmoid colon, invading bladder, T4N0, s/p en bloc resection 10/20/2013 11/06/2013   Protein-calorie malnutrition, severe ([place]) 10/19/2013   Iron deficiency anemia, multifactorial with GI/GU and menstrual losses  contributory 10/18/2013   Diverticulitis of colon (without mention of hemorrhage)(562.11) 10/18/2013   Weight loss 10/17/2013   Altered mental status 08/07/2013   UTI (lower urinary tract infection) 08/07/2013   Hypokalemia 08/07/2013   Anemia 08/07/2013  Romana Juniper, MD [facility], [place]  See AMION to contact appropriate on-call provider

## 2019-10-09 ENCOUNTER — Other Ambulatory Visit: Payer: Self-pay

## 2019-10-09 ENCOUNTER — Encounter: Payer: BC Managed Care – PPO | Attending: Surgery | Admitting: Skilled Nursing Facility1

## 2019-10-09 DIAGNOSIS — E669 Obesity, unspecified: Secondary | ICD-10-CM | POA: Insufficient documentation

## 2019-10-09 NOTE — Progress Notes (Signed)
Pre-Operative Nutrition Class:  Appt start time: 5797   End time:  1830.  Patient was seen on 10/09/2019 for Pre-Operative Bariatric Surgery Education at [facility].    Surgery date:  Surgery type: sleeve [facility] weight at [facility]: 321 Weight today: 319.2  Samples given per MNT protocol. Patient educated on appropriate usage: Bariatric Advantage Multivitamin Lot #[id number] Exp:08/21  Bariatric Advantage Calcium  Lot #[id number] Exp:07/01/2020  Protein Shake Lot #[id number] Exp:11/13/2020  The following the learning objectives were met by the patient during this course:  Identify Pre-Op Dietary Goals and will begin 2 weeks pre-operatively  Identify appropriate sources of fluids and proteins   State protein recommendations and appropriate sources pre and post-operatively  Identify Post-Operative Dietary Goals and will follow for 2 weeks post-operatively  Identify appropriate multivitamin and calcium sources  Describe the need for physical activity post-operatively and will follow MD recommendations  State when to call healthcare provider regarding medication questions or post-operative complications  Handouts given during class include:  Pre-Op Bariatric Surgery Diet Handout  Protein Shake Handout  Post-Op Bariatric Surgery Nutrition Handout  [facility] Program Information Flyer  Support Group Information Flyer  [facility] Bariatric Supplements Price List  Follow-Up Plan: Patient will follow-up at [facility] 2 weeks post operatively for diet advancement per MD.

## 2019-10-16 NOTE — Patient Instructions (Addendum)
DUE TO COVID-19 ONLY ONE VISITOR IS ALLOWED TO COME WITH YOU AND STAY IN THE WAITING ROOM ONLY DURING PRE OP AND PROCEDURE DAY OF SURGERY. THE 1 VISITOR MAY VISIT WITH YOU AFTER SURGERY IN YOUR PRIVATE ROOM DURING VISITING HOURS ONLY!  YOU NEED TO HAVE A COVID 19 TEST ON 10-26-19 @2 :55 PM, THIS TEST MUST BE DONE BEFORE SURGERY, COME  [place] , [id number].  ([place]) ONCE YOUR COVID TEST IS COMPLETED, PLEASE BEGIN THE QUARANTINE INSTRUCTIONS AS OUTLINED IN YOUR HANDOUT.                Valma Metzger  10/16/2019   Your procedure is scheduled on: 10-30-19   Report to [facility] Main  Entrance    Report to Admitting at 9:30 AM     Call this number if you have problems the morning of surgery [phone]    Remember: MORNING OF SURGERY DRINK:   DRINK 1 G2 drink BEFORE YOU LEAVE HOME, DRINK ALL OF THE  G2 DRINK AT ONE TIME.  NO SOLID FOOD AFTER 600 PM THE NIGHT BEFORE YOUR SURGERY. YOU MAY DRINK CLEAR FLUIDS. THE G2 DRINK YOU DRINK BEFORE YOU LEAVE HOME WILL BE THE LAST FLUIDS YOU DRINK BEFORE SURGERY. PLEASE DRINK BY 8:30 AM   CLEAR LIQUID DIET   Foods Allowed                                                                     Foods Excluded  Coffee and tea, regular and decaf                             liquids that you cannot  Plain Jell-O any favor except red or purple                                           see through such as: Fruit ices (not with fruit pulp)                                     milk, soups, orange juice  Iced Popsicles                                    All solid food Carbonated beverages, regular and diet                                    Cranberry, grape and apple juices Sports drinks like Gatorade Lightly seasoned clear broth or consume(fat free) Sugar, honey syrup  Sample Menu Breakfast                                Lunch  Supper Cranberry juice                     Beef broth                            Chicken broth Jell-O                                     Grape juice                           Apple juice Coffee or tea                        Jell-O                                      Popsicle                                                Coffee or tea                        Coffee or tea  _____________________________________________________________________   PAIN IS EXPECTED AFTER SURGERY AND WILL NOT BE COMPLETELY ELIMINATED. AMBULATION AND TYLENOL WILL HELP REDUCE INCISIONAL AND GAS PAIN. MOVEMENT IS KEY!  YOU ARE EXPECTED TO BE OUT OF BED WITHIN 4 HOURS OF ADMISSION TO YOUR PATIENT ROOM.  SITTING IN THE RECLINER THROUGHOUT THE DAY IS IMPORTANT FOR DRINKING FLUIDS AND MOVING GAS THROUGHOUT THE GI TRACT.  COMPRESSION STOCKINGS SHOULD BE WORN [place] UNLESS YOU ARE WALKING.   INCENTIVE SPIROMETER SHOULD BE USED EVERY HOUR WHILE AWAKE TO DECREASE POST-OPERATIVE COMPLICATIONS SUCH AS PNEUMONIA.  WHEN DISCHARGED HOME, IT IS IMPORTANT TO CONTINUE TO WALK EVERY HOUR AND USE THE INCENTIVE SPIROMETER EVERY HOUR.      Take these medicines the morning of surgery with A SIP OF WATER: None  BRUSH YOUR TEETH MORNING OF SURGERY AND RINSE YOUR MOUTH OUT, NO CHEWING GUM [place] OR MINTS.                              You may not have any metal on your body including hair pins and              piercings     Do not wear jewelry, make-up, lotions, powders or perfumes, deodorant              Do not wear nail polish on your fingernails.  Do not shave  48 hours prior to surgery.     Do not bring valuables to the hospital. [place].  Contacts, dentures or bridgework may not be worn into surgery.  You may bring an overnight bag    Special Instructions: N/A              Please read over the following fact sheets you were  given: _____________________________________________________________________  [facility] - Preparing for Surgery Before surgery, you can play an important role.  Because skin is not sterile, your skin needs to be as free of germs as possible.  You can reduce the number of germs on your skin by washing with CHG (chlorahexidine gluconate) soap before surgery.  CHG is an antiseptic cleaner which kills germs and bonds with the skin to continue killing germs even after washing. Please DO NOT use if you have an allergy to CHG or antibacterial soaps.  If your skin becomes reddened/irritated stop using the CHG and inform your nurse when you arrive at Short Stay. Do not shave (including legs and underarms) for at least 48 hours prior to the first CHG shower.  You may shave your face/neck. Please follow these instructions carefully:  1.  Shower with CHG Soap the night before surgery and the  morning of Surgery.  2.  If you choose to wash your hair, wash your hair first as usual with your  normal  shampoo.  3.  After you shampoo, rinse your hair and body thoroughly to remove the  shampoo.                           4.  Use CHG as you would any other liquid soap.  You can apply chg directly  to the skin and wash                       Gently with a scrungie or clean washcloth.  5.  Apply the CHG Soap to your body ONLY FROM THE NECK DOWN.   Do not use on face/ open                           Wound or open sores. Avoid contact with eyes, ears mouth and genitals (private parts).                       Wash face,  Genitals (private parts) with your normal soap.             6.  Wash thoroughly, paying special attention to the area where your surgery  will be performed.  7.  Thoroughly rinse your body with warm water from the neck down.  8.  DO NOT shower/wash with your normal soap after using and rinsing off  the CHG Soap.                9.  Pat yourself dry with a clean towel.            10.  Wear clean pajamas.             11.  Place clean sheets on your bed the night of your first shower and do not  sleep with pets. Day of Surgery : Do not apply any lotions/deodorants the morning of surgery.  Please wear clean clothes to the hospital/surgery center.  FAILURE TO FOLLOW THESE INSTRUCTIONS MAY RESULT IN THE CANCELLATION OF YOUR SURGERY PATIENT SIGNATURE_________________________________  NURSE SIGNATURE__________________________________  ________________________________________________________________________   Adam Phenix  An incentive spirometer is a tool that can help keep your lungs clear and active. This tool measures how well you are filling your lungs with each breath. Taking long deep breaths may help reverse or decrease the chance of developing breathing (pulmonary) problems (especially infection) following:  A long period of time when  you are unable to move or be active. BEFORE THE PROCEDURE   If the spirometer includes an indicator to show your best effort, your nurse or respiratory therapist will set it to a desired goal.  If possible, sit up straight or lean slightly forward. Try not to slouch.  Hold the incentive spirometer in an upright position. INSTRUCTIONS FOR USE  1. Sit on the edge of your bed if possible, or sit up as far as you can in bed or on a chair. 2. Hold the incentive spirometer in an upright position. 3. Breathe out normally. 4. Place the mouthpiece in your mouth and seal your lips tightly around it. 5. Breathe in slowly and as deeply as possible, raising the piston or the ball toward the top of the column. 6. Hold your breath for 3-5 seconds or for as long as possible. Allow the piston or ball to fall to the bottom of the column. 7. Remove the mouthpiece from your mouth and breathe out normally. 8. Rest for a few seconds and repeat Steps 1 through 7 at least 10 times every 1-2 hours when you are awake. Take your time and take a few normal breaths between deep  breaths. 9. The spirometer may include an indicator to show your best effort. Use the indicator as a goal to work toward during each repetition. 10. After each set of 10 deep breaths, practice coughing to be sure your lungs are clear. If you have an incision (the cut made at the time of surgery), support your incision when coughing by placing a pillow or rolled up towels firmly against it. Once you are able to get out of bed, walk around indoors and cough well. You may stop using the incentive spirometer when instructed by your caregiver.  RISKS AND COMPLICATIONS  Take your time so you do not get dizzy or light-headed.  If you are in pain, you may need to take or ask for pain medication before doing incentive spirometry. It is harder to take a deep breath if you are having pain. AFTER USE  Rest and breathe slowly and easily.  It can be helpful to keep track of a log of your progress. Your caregiver can provide you with a simple table to help with this. If you are using the spirometer at home, follow these instructions: [place] IF:   You are having difficultly using the spirometer.  You have trouble using the spirometer as often as instructed.  Your pain medication is not giving enough relief while using the spirometer.  You develop fever of 100.5 F (38.1 C) or higher. SEEK IMMEDIATE MEDICAL CARE IF:   You cough up bloody sputum that had not been present before.  You develop fever of 102 F (38.9 C) or greater.  You develop worsening pain at or near the incision site. MAKE SURE YOU:   Understand these instructions.  Will watch your condition.  Will get help right away if you are not doing well or get worse. Document Released: 10/26/2006 Document Revised: 09/07/2011 Document Reviewed: 12/27/2006 [facility] Patient Information 2014 [place], [place].   ________________________________________________________________________

## 2019-10-16 NOTE — Progress Notes (Signed)
PCP - Leighton Ruff, MD  Cardiologist - Larae Grooms, MD w.cardiac clearance dated 09-18-19 in OV note.  Chest x-ray - 07-03-19  EKG - 07-03-19 Stress Test -  ECHO -  Cardiac Cath -   Sleep Study -  CPAP -   Fasting Blood Sugar -  Checks Blood Sugar _____ times a day  Blood Thinner Instructions: Aspirin Instructions: Last Dose:  Anesthesia review:   Patient denies shortness of breath, fever, cough and chest pain at PAT appointment   Patient verbalized understanding of instructions that were given to them at the PAT appointment. Patient was also instructed that they will need to review over the PAT instructions again at home before surgery.

## 2019-10-17 ENCOUNTER — Encounter (HOSPITAL_COMMUNITY): Payer: Self-pay

## 2019-10-17 ENCOUNTER — Encounter (HOSPITAL_COMMUNITY)
Admission: RE | Admit: 2019-10-17 | Discharge: 2019-10-17 | Disposition: A | Discharge status: Home / Self Care | Payer: BC Managed Care – PPO | Source: Ambulatory Visit | Attending: Surgery | Admitting: Surgery

## 2019-10-17 ENCOUNTER — Other Ambulatory Visit: Payer: Self-pay

## 2019-10-17 DIAGNOSIS — Z01812 Encounter for preprocedural laboratory examination: Secondary | ICD-10-CM | POA: Insufficient documentation

## 2019-10-17 LAB — CBC WITH DIFFERENTIAL/PLATELET
Abs Immature Granulocytes: 0.02 10*3/uL (ref 0.00–0.07)
Basophils Absolute: 0 10*3/uL (ref 0.0–0.1)
Basophils Relative: 0 %
Eosinophils Absolute: 0.1 10*3/uL (ref 0.0–0.5)
Eosinophils Relative: 2 %
HCT: 40.6 % (ref 36.0–46.0)
Hemoglobin: 12.4 g/dL (ref 12.0–15.0)
Immature Granulocytes: 0 %
Lymphocytes Relative: 23 %
Lymphs Abs: 1.5 10*3/uL (ref 0.7–4.0)
MCH: 27 pg (ref 26.0–34.0)
MCHC: 30.5 g/dL (ref 30.0–36.0)
MCV: 88.3 fL (ref 80.0–100.0)
Monocytes Absolute: 0.4 10*3/uL (ref 0.1–1.0)
Monocytes Relative: 6 %
Neutro Abs: 4.6 10*3/uL (ref 1.7–7.7)
Neutrophils Relative %: 69 %
Platelets: 226 10*3/uL (ref 150–400)
RBC: 4.6 MIL/uL (ref 3.87–5.11)
RDW: 15.7 % — ABNORMAL HIGH (ref 11.5–15.5)
WBC: 6.6 10*3/uL (ref 4.0–10.5)
nRBC: 0 % (ref 0.0–0.2)

## 2019-10-17 LAB — COMPREHENSIVE METABOLIC PANEL
ALT: 30 U/L (ref 0–44)
AST: 27 U/L (ref 15–41)
Albumin: 3.9 g/dL (ref 3.5–5.0)
Alkaline Phosphatase: 99 U/L (ref 38–126)
Anion gap: 7 (ref 5–15)
BUN: 18 mg/dL (ref 6–20)
CO2: 26 mmol/L (ref 22–32)
Calcium: 8.9 mg/dL (ref 8.9–10.3)
Chloride: 106 mmol/L (ref 98–111)
Creatinine, Ser: 0.92 mg/dL (ref 0.44–1.00)
GFR calc Af Amer: >60 mL/min (ref >60)
GFR calc non Af Amer: >60 mL/min (ref >60)
Glucose, Bld: 98 mg/dL (ref 70–99)
Potassium: 4.1 mmol/L (ref 3.5–5.1)
Sodium: 139 mmol/L (ref 135–145)
Total Bilirubin: 0.4 mg/dL (ref 0.3–1.2)
Total Protein: 7.1 g/dL (ref 6.5–8.1)

## 2019-10-26 ENCOUNTER — Other Ambulatory Visit (HOSPITAL_COMMUNITY)
Admission: RE | Admit: 2019-10-26 | Discharge: 2019-10-26 | Disposition: A | Discharge status: Home / Self Care | Payer: BC Managed Care – PPO | Source: Ambulatory Visit | Attending: Surgery | Admitting: Surgery

## 2019-10-26 DIAGNOSIS — Z01812 Encounter for preprocedural laboratory examination: Secondary | ICD-10-CM | POA: Diagnosis not present

## 2019-10-26 DIAGNOSIS — U071 COVID-19: Secondary | ICD-10-CM | POA: Insufficient documentation

## 2019-10-26 LAB — SARS CORONAVIRUS 2 (TAT 6-24 HRS): SARS Coronavirus 2: POSITIVE — AB

## 2019-10-27 ENCOUNTER — Encounter: Payer: Self-pay | Admitting: Physician Assistant

## 2019-10-27 ENCOUNTER — Telehealth: Payer: Self-pay | Admitting: Physician Assistant

## 2019-10-27 NOTE — Telephone Encounter (Signed)
Called to discuss with Amy Bentley about Covid symptoms and the use of bamlanivimab/etesevimab or casirivimab/imdevimab, a monoclonal antibody infusion for those with mild to moderate Covid symptoms and at a high risk of hospitalization.   Pt does not qualify for infusion therapy as pt has asymptomatic infection. Isolation precautions discussed. Advised to contact back for consideration should they develop symptoms. Patient verbalized understanding. She was tested in anticipation of bariatric surgery. She was vaccinated x2 with her second dose given over 2 weeks ago. She is completely asymptomatic. I have filled out a report for a "breakthrough case" through the state.     Patient Active Problem List   Diagnosis Date Noted  . Morbid obesity ([place])   . History of colon cancer   . S/P laparoscopic procedure 07/21/2017  . Incisional hernia, reducible 07/16/2017  . Nausea and vomiting 07/12/2017  . Malignant neoplasm of sigmoid colon ([place]) 03/13/2014  . Family history of malignant neoplasm of gastrointestinal tract 03/13/2014  . 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 Protein-calorie malnutrition, severe ([place]) 10/19/2013  . Iron deficiency anemia, multifactorial with GI/GU and menstrual losses contributory 10/18/2013  . Diverticulitis of colon (without mention of hemorrhage)(562.11) 10/18/2013  . Weight loss 10/17/2013  . Altered mental status 08/07/2013  . UTI (lower urinary tract infection) 08/07/2013  . Hypokalemia 08/07/2013  . Anemia 08/07/2013    Angelena Form PA-C

## 2019-10-30 LAB — TYPE AND SCREEN
ABO/RH(D): O POS
Antibody Screen: NEGATIVE

## 2019-11-04 DIAGNOSIS — Z20828 Contact with and (suspected) exposure to other viral communicable diseases: Secondary | ICD-10-CM | POA: Diagnosis not present

## 2019-11-14 ENCOUNTER — Ambulatory Visit: Payer: BC Managed Care – PPO

## 2019-11-14 NOTE — Patient Instructions (Addendum)
DUE TO COVID-19 ONLY ONE VISITOR IS ALLOWED TO COME WITH YOU AND STAY IN THE WAITING ROOM ONLY DURING PRE OP AND PROCEDURE DAY OF SURGERY. THE 2 VISITORS  MAY VISIT WITH YOU AFTER SURGERY IN YOUR PRIVATE ROOM DURING VISITING HOURS ONLY!  Amy Bentley    Your procedure is scheduled on: 12/04/19   Report to [facility] Main  Entrance   Report to Short Stay at 5:30 AM     Call this number if you have problems the morning of surgery [place], NO CHEWING GUM [place].  MORNING OF SURGERY DRINK:   DRINK 1 G2 drink BEFORE YOU LEAVE HOME, DRINK ALL OF THE  G2 DRINK AT ONE TIME.    NO SOLID FOOD AFTER 6:00 PM THE NIGHT BEFORE YOUR SURGERY.   YOU MAY DRINK CLEAR FLUIDS. THE G2 DRINK YOU DRINK BEFORE YOU LEAVE HOME WILL BE THE LAST FLUIDS YOU DRINK BEFORE SURGERY.  PAIN IS EXPECTED AFTER SURGERY AND WILL NOT BE COMPLETELY ELIMINATED.   AMBULATION AND TYLENOL WILL HELP REDUCE INCISIONAL AND GAS PAIN. MOVEMENT IS KEY!  YOU ARE EXPECTED TO BE OUT OF BED WITHIN 4 HOURS OF ADMISSION TO YOUR PATIENT ROOM.  SITTING IN THE RECLINER THROUGHOUT THE DAY IS IMPORTANT FOR DRINKING FLUIDS AND MOVING GAS THROUGHOUT THE GI TRACT.  COMPRESSION STOCKINGS SHOULD BE WORN [place] UNLESS YOU ARE WALKING.   INCENTIVE SPIROMETER SHOULD BE USED EVERY HOUR WHILE AWAKE TO DECREASE POST-OPERATIVE COMPLICATIONS SUCH AS PNEUMONIA.  WHEN DISCHARGED HOME, IT IS IMPORTANT TO CONTINUE TO WALK EVERY HOUR AND USE THE INCENTIVE SPIROMETER EVERY HOUR.     Take these medicines the morning of surgery with A SIP OF WATER: None                                 You may not have any metal on your body including hair pins and              piercings  Do not wear jewelry, make-up, lotions, powders or perfumes, deodorant             Do not wear nail polish on your fingernails.  Do not shave  48 hours  prior to surgery.           .   Do not bring valuables to the hospital. [employer].  Contacts, dentures or bridgework may not be worn into surgery.       Name and phone number of your driver:  Special Instructions: N/A              Please read over the following fact sheets you were given: _____________________________________________________________________             [facility] - Preparing for Surgery Before surgery, you can play an important role.   Because skin is not sterile, your skin needs to be as free of germs as possible .  You can reduce the number of germs on your skin by washing with CHG (chlorahexidine gluconate) soap before surgery.   CHG is an antiseptic cleaner which kills germs and bonds with the  skin to continue killing germs even after washing. Please DO NOT use if you have an allergy to CHG or antibacterial soaps.   If your skin becomes reddened/irritated stop using the CHG and inform your nurse when you arrive at Short Stay. Do not shave (including legs and underarms) for at least 48 hours prior to the first CHG shower.  Please follow these instructions carefully:  1.  Shower with CHG Soap the night before surgery and the  morning of Surgery.  2.  If you choose to wash your hair, wash your hair first as usual with your  normal  shampoo.  3.  After you shampoo, rinse your hair and body thoroughly to remove the  shampoo.                                        4.  Use CHG as you would any other liquid soap.  You can apply chg directly  to the skin and wash                       Gently with a scrungie or clean washcloth.  5.  Apply the CHG Soap to your body ONLY FROM THE NECK DOWN.   Do not use on face/ open                           Wound or open sores. Avoid contact with eyes, ears mouth and genitals (private parts).                       Wash face,  Genitals (private parts) with your normal soap.             6.   Wash thoroughly, paying special attention to the area where your surgery  will be performed.  7.  Thoroughly rinse your body with warm water from the neck down.  8.  DO NOT shower/wash with your normal soap after using and rinsing off  the CHG Soap.             9.  Pat yourself dry with a clean towel.            10.  Wear clean pajamas.            11.  Place clean sheets on your bed the night of your first shower and do not  sleep with pets. Day of Surgery : Do not apply any lotions/deodorants the morning of surgery.  Please wear clean clothes to the hospital/surgery center.  FAILURE TO FOLLOW THESE INSTRUCTIONS MAY RESULT IN THE CANCELLATION OF YOUR SURGERY PATIENT SIGNATURE_________________________________  NURSE SIGNATURE__________________________________  ________________________________________________________________________   Amy Bentley  An incentive spirometer is a tool that can help keep your lungs clear and active. This tool measures how well you are filling your lungs with each breath. Taking long deep breaths may help reverse or decrease the chance of developing breathing (pulmonary) problems (especially infection) following:  A long period of time when you are unable to move or be active. BEFORE THE PROCEDURE   If the spirometer includes an indicator to show your best effort, your nurse or respiratory therapist will set it to a desired goal.  If possible, sit up straight or lean slightly forward. Try not to slouch.  Hold the incentive spirometer in an  upright position. INSTRUCTIONS FOR USE  1. Sit on the edge of your bed if possible, or sit up as far as you can in bed or on a chair. 2. Hold the incentive spirometer in an upright position. 3. Breathe out normally. 4. Place the mouthpiece in your mouth and seal your lips tightly around it. 5. Breathe in slowly and as deeply as possible, raising the piston or the ball toward the top of the column. 6. Hold your  breath for 3-5 seconds or for as long as possible. Allow the piston or ball to fall to the bottom of the column. 7. Remove the mouthpiece from your mouth and breathe out normally. 8. Rest for a few seconds and repeat Steps 1 through 7 at least 10 times every 1-2 hours when you are awake. Take your time and take a few normal breaths between deep breaths. 9. The spirometer may include an indicator to show your best effort. Use the indicator as a goal to work toward during each repetition. 10. After each set of 10 deep breaths, practice coughing to be sure your lungs are clear. If you have an incision (the cut made at the time of surgery), support your incision when coughing by placing a pillow or rolled up towels firmly against it. Once you are able to get out of bed, walk around indoors and cough well. You may stop using the incentive spirometer when instructed by your caregiver.  RISKS AND COMPLICATIONS  Take your time so you do not get dizzy or light-headed.  If you are in pain, you may need to take or ask for pain medication before doing incentive spirometry. It is harder to take a deep breath if you are having pain. AFTER USE  Rest and breathe slowly and easily.  It can be helpful to keep track of a log of your progress. Your caregiver can provide you with a simple table to help with this. If you are using the spirometer at home, follow these instructions: [place] IF:   You are having difficultly using the spirometer.  You have trouble using the spirometer as often as instructed.  Your pain medication is not giving enough relief while using the spirometer.  You develop fever of 100.5 F (38.1 C) or higher. SEEK IMMEDIATE MEDICAL CARE IF:   You cough up bloody sputum that had not been present before.  You develop fever of 102 F (38.9 C) or greater.  You develop worsening pain at or near the incision site. MAKE SURE YOU:   Understand these instructions.  Will watch  your condition.  Will get help right away if you are not doing well or get worse. Document Released: 10/26/2006 Document Revised: 09/07/2011 Document Reviewed: 12/27/2006 [facility] Patient Information 2014 [place], [place].   ________________________________________________________________________

## 2019-11-21 ENCOUNTER — Other Ambulatory Visit: Payer: Self-pay

## 2019-11-21 ENCOUNTER — Encounter (HOSPITAL_COMMUNITY): Payer: Self-pay

## 2019-11-21 ENCOUNTER — Encounter (HOSPITAL_COMMUNITY)
Admission: RE | Admit: 2019-11-21 | Discharge: 2019-11-21 | Disposition: A | Discharge status: Home / Self Care | Payer: BC Managed Care – PPO | Source: Ambulatory Visit | Attending: Surgery | Admitting: Surgery

## 2019-11-21 DIAGNOSIS — Z01812 Encounter for preprocedural laboratory examination: Secondary | ICD-10-CM | POA: Diagnosis not present

## 2019-11-21 NOTE — Progress Notes (Signed)
PCP - Dr. Edwin Dada Cardiologist - Dr. Lendell Caprice  Chest x-ray - no EKG - no Stress Test - no ECHO - no Cardiac Cath - no  Sleep Study - no CPAP -   Fasting Blood Sugar - NA Checks Blood Sugar _____ times a day  Blood Thinner Instructions:NA Aspirin Instructions: Last Dose:  Anesthesia review:   Patient denies shortness of breath, fever, cough and chest pain at PAT appointment yes  Patient verbalized understanding of instructions that were given to them at the PAT appointment. Patient was also instructed that they will need to review over the PAT instructions again at home before surgery. yes

## 2019-11-23 ENCOUNTER — Encounter (HOSPITAL_COMMUNITY)
Admission: RE | Admit: 2019-11-23 | Discharge: 2019-11-23 | Disposition: A | Discharge status: Home / Self Care | Payer: BC Managed Care – PPO | Source: Ambulatory Visit | Attending: Surgery | Admitting: Surgery

## 2019-11-23 ENCOUNTER — Other Ambulatory Visit: Payer: Self-pay

## 2019-11-23 DIAGNOSIS — Z01812 Encounter for preprocedural laboratory examination: Secondary | ICD-10-CM | POA: Insufficient documentation

## 2019-11-23 LAB — CBC WITH DIFFERENTIAL/PLATELET
Abs Immature Granulocytes: 0.01 10*3/uL (ref 0.00–0.07)
Basophils Absolute: 0 10*3/uL (ref 0.0–0.1)
Basophils Relative: 0 %
Eosinophils Absolute: 0.1 10*3/uL (ref 0.0–0.5)
Eosinophils Relative: 1 %
HCT: 44.1 % (ref 36.0–46.0)
Hemoglobin: 13.6 g/dL (ref 12.0–15.0)
Immature Granulocytes: 0 %
Lymphocytes Relative: 21 %
Lymphs Abs: 1.7 10*3/uL (ref 0.7–4.0)
MCH: 26.4 pg (ref 26.0–34.0)
MCHC: 30.8 g/dL (ref 30.0–36.0)
MCV: 85.5 fL (ref 80.0–100.0)
Monocytes Absolute: 0.6 10*3/uL (ref 0.1–1.0)
Monocytes Relative: 7 %
Neutro Abs: 5.7 10*3/uL (ref 1.7–7.7)
Neutrophils Relative %: 71 %
Platelets: 262 10*3/uL (ref 150–400)
RBC: 5.16 MIL/uL — ABNORMAL HIGH (ref 3.87–5.11)
RDW: 15.1 % (ref 11.5–15.5)
WBC: 8 10*3/uL (ref 4.0–10.5)
nRBC: 0 % (ref 0.0–0.2)

## 2019-11-23 LAB — COMPREHENSIVE METABOLIC PANEL
ALT: 19 U/L (ref 0–44)
AST: 23 U/L (ref 15–41)
Albumin: 4.2 g/dL (ref 3.5–5.0)
Alkaline Phosphatase: 94 U/L (ref 38–126)
Anion gap: 7 (ref 5–15)
BUN: 11 mg/dL (ref 6–20)
CO2: 25 mmol/L (ref 22–32)
Calcium: 9.1 mg/dL (ref 8.9–10.3)
Chloride: 105 mmol/L (ref 98–111)
Creatinine, Ser: 0.92 mg/dL (ref 0.44–1.00)
GFR calc Af Amer: >60 mL/min (ref >60)
GFR calc non Af Amer: >60 mL/min (ref >60)
Glucose, Bld: 104 mg/dL — ABNORMAL HIGH (ref 70–99)
Potassium: 3.8 mmol/L (ref 3.5–5.1)
Sodium: 137 mmol/L (ref 135–145)
Total Bilirubin: 0.7 mg/dL (ref 0.3–1.2)
Total Protein: 7.6 g/dL (ref 6.5–8.1)

## 2019-11-24 LAB — TYPE AND SCREEN
ABO/RH(D): O POS
Antibody Screen: NEGATIVE

## 2019-11-29 NOTE — Progress Notes (Signed)
Pt scheduled for pre-op covid test on 11/30/19- pt positive on 10/26/19. Procedure within 90 days of positive test, no retest per protocol.

## 2019-11-30 ENCOUNTER — Other Ambulatory Visit (HOSPITAL_COMMUNITY): Payer: BC Managed Care – PPO

## 2019-12-03 MED ORDER — BUPIVACAINE LIPOSOME 1.3 % IJ SUSP
20.0000 mL | Freq: Once | INTRAMUSCULAR | Status: DC
  Filled 2019-12-03: qty 20

## 2019-12-03 NOTE — Anesthesia Preprocedure Evaluation (Addendum)
Anesthesia Evaluation  Patient identified by MRN, date of birth, ID band Patient awake    Reviewed: Allergy & Precautions, NPO status , Patient's Chart, lab work & pertinent test results  Airway Mallampati: II  TM Distance: >3 FB Neck ROM: Full    Dental no notable dental hx. (+) Dental Advisory Given, Teeth Intact   Pulmonary neg pulmonary ROS, COPD: bactrim., Patient abstained from smoking., former smoker,    Pulmonary exam normal breath sounds clear to auscultation       Cardiovascular negative cardio ROS Normal cardiovascular exam Rhythm:Regular Rate:Normal  EKG 07-03-19 NSR R 60   Neuro/Psych negative neurological ROS  negative psych ROS   GI/Hepatic GERD  Medicated,Colon Ca   Endo/Other  Morbid obesity  Renal/GU Renal InsufficiencyRenal diseaseK+ 3.8 Cr 0.92     Musculoskeletal negative musculoskeletal ROS (+)   Abdominal (+) + obese,   Peds  Hematology Lab Results      Component                Value               Date                      WBC                      8.0                 11/23/2019                HGB                      13.6                11/23/2019                HCT                      44.1                11/23/2019                MCV                      85.5                11/23/2019                PLT                      262                 11/23/2019            T&S available   Anesthesia Other Findings All: bactrim  Reproductive/Obstetrics negative OB ROS                            Anesthesia Physical Anesthesia Plan  ASA: III  Anesthesia Plan: General   Post-op Pain Management:    Induction: Intravenous  PONV Risk Score and Plan: 4 or greater and Treatment may vary due to age or medical condition, Midazolam, Dexamethasone and Ondansetron  Airway Management Planned: Video Laryngoscope Planned and Oral ETT  Additional Equipment: None  Intra-op  Plan:   Post-operative Plan: Extubation in OR  Informed Consent:  Dental advisory given  Plan Discussed with: CRNA  Anesthesia Plan Comments: (GA + lidocaine infusion + ketamine 0.3 mcg/kg)      Anesthesia Quick Evaluation

## 2019-12-04 ENCOUNTER — Inpatient Hospital Stay (HOSPITAL_COMMUNITY): Payer: BC Managed Care – PPO | Admitting: Certified Registered Nurse Anesthetist

## 2019-12-04 ENCOUNTER — Encounter (HOSPITAL_COMMUNITY)
Admission: RE | Disposition: A | Discharge status: Home / Self Care | Payer: Self-pay | Source: Home / Self Care | Attending: Surgery

## 2019-12-04 ENCOUNTER — Inpatient Hospital Stay (HOSPITAL_COMMUNITY)
Admission: RE | Admit: 2019-12-04 | Discharge: 2019-12-05 | DRG: 621 | Disposition: A | Discharge status: Home / Self Care | Payer: BC Managed Care – PPO | Attending: Surgery | Admitting: Surgery

## 2019-12-04 ENCOUNTER — Encounter (HOSPITAL_COMMUNITY): Payer: Self-pay | Admitting: Surgery

## 2019-12-04 DIAGNOSIS — E785 Hyperlipidemia, unspecified: Secondary | ICD-10-CM | POA: Diagnosis not present

## 2019-12-04 DIAGNOSIS — K432 Incisional hernia without obstruction or gangrene: Secondary | ICD-10-CM | POA: Diagnosis not present

## 2019-12-04 DIAGNOSIS — R030 Elevated blood-pressure reading, without diagnosis of hypertension: Secondary | ICD-10-CM | POA: Diagnosis present

## 2019-12-04 DIAGNOSIS — Z9049 Acquired absence of other specified parts of digestive tract: Secondary | ICD-10-CM | POA: Diagnosis not present

## 2019-12-04 DIAGNOSIS — K219 Gastro-esophageal reflux disease without esophagitis: Secondary | ICD-10-CM | POA: Diagnosis not present

## 2019-12-04 DIAGNOSIS — Z6841 Body Mass Index (BMI) 40.0 and over, adult: Secondary | ICD-10-CM

## 2019-12-04 DIAGNOSIS — K760 Fatty (change of) liver, not elsewhere classified: Secondary | ICD-10-CM | POA: Diagnosis present

## 2019-12-04 DIAGNOSIS — N390 Urinary tract infection, site not specified: Secondary | ICD-10-CM | POA: Diagnosis not present

## 2019-12-04 DIAGNOSIS — N189 Chronic kidney disease, unspecified: Secondary | ICD-10-CM | POA: Diagnosis not present

## 2019-12-04 DIAGNOSIS — Z85038 Personal history of other malignant neoplasm of large intestine: Secondary | ICD-10-CM | POA: Diagnosis not present

## 2019-12-04 DIAGNOSIS — Z87891 Personal history of nicotine dependence: Secondary | ICD-10-CM | POA: Diagnosis not present

## 2019-12-04 DIAGNOSIS — D509 Iron deficiency anemia, unspecified: Secondary | ICD-10-CM | POA: Diagnosis not present

## 2019-12-04 HISTORY — PX: LAPAROSCOPIC GASTRIC SLEEVE RESECTION: SHX5895

## 2019-12-04 HISTORY — PX: UPPER GI ENDOSCOPY: SHX6162

## 2019-12-04 LAB — PREGNANCY, URINE: Preg Test, Ur: NEGATIVE

## 2019-12-04 SURGERY — GASTRECTOMY, SLEEVE, LAPAROSCOPIC
Anesthesia: General | Site: Abdomen

## 2019-12-04 MED ORDER — SUGAMMADEX SODIUM 500 MG/5ML IV SOLN
INTRAVENOUS | Status: DC | PRN
Start: 2019-12-04 — End: 2019-12-04
  Administered 2019-12-04: 500 mg via INTRAVENOUS

## 2019-12-04 MED ORDER — SCOPOLAMINE 1 MG/3DAYS TD PT72
1.0000 | MEDICATED_PATCH | TRANSDERMAL | Status: DC
  Administered 2019-12-04: 1.5 mg via TRANSDERMAL
  Filled 2019-12-04: qty 1

## 2019-12-04 MED ORDER — HYDROCODONE-ACETAMINOPHEN 7.5-325 MG PO TABS: 1.0000 | ORAL_TABLET | Freq: Once | ORAL | Status: DC | PRN

## 2019-12-04 MED ORDER — OXYCODONE HCL 5 MG/5ML PO SOLN: 5.0000 mg | Freq: Four times a day (QID) | ORAL | Status: DC | PRN

## 2019-12-04 MED ORDER — DEXAMETHASONE SODIUM PHOSPHATE 10 MG/ML IJ SOLN
INTRAMUSCULAR | Status: DC | PRN
  Administered 2019-12-04: 5 mg via INTRAVENOUS

## 2019-12-04 MED ORDER — LIDOCAINE HCL 2 % IJ SOLN
INTRAMUSCULAR | Status: AC
  Filled 2019-12-04: qty 20

## 2019-12-04 MED ORDER — CHLORHEXIDINE GLUCONATE 4 % EX LIQD: 60.0000 mL | Freq: Once | CUTANEOUS | Status: DC

## 2019-12-04 MED ORDER — ACETAMINOPHEN 10 MG/ML IV SOLN: 1000.0000 mg | Freq: Once | INTRAVENOUS | Status: DC | PRN

## 2019-12-04 MED ORDER — METHOCARBAMOL 500 MG IVPB - SIMPLE MED
500.0000 mg | Freq: Four times a day (QID) | INTRAVENOUS | Status: DC | PRN
  Filled 2019-12-04: qty 50

## 2019-12-04 MED ORDER — ONDANSETRON HCL 4 MG/2ML IJ SOLN
INTRAMUSCULAR | Status: DC | PRN
  Administered 2019-12-04: 4 mg via INTRAVENOUS

## 2019-12-04 MED ORDER — LABETALOL HCL 5 MG/ML IV SOLN
5.0000 mg | Freq: Once | INTRAVENOUS | Status: AC
  Administered 2019-12-04: 5 mg via INTRAVENOUS

## 2019-12-04 MED ORDER — LACTATED RINGERS IV SOLN
INTRAVENOUS | Status: DC
  Administered 2019-12-04: 1000 mL via INTRAVENOUS

## 2019-12-04 MED ORDER — TRAMADOL HCL 50 MG PO TABS: 50.0000 mg | ORAL_TABLET | Freq: Four times a day (QID) | ORAL | Status: DC | PRN

## 2019-12-04 MED ORDER — CHLORHEXIDINE GLUCONATE 0.12 % MT SOLN
15.0000 mL | OROMUCOSAL | Status: AC
  Administered 2019-12-04: 15 mL via OROMUCOSAL

## 2019-12-04 MED ORDER — SUCCINYLCHOLINE CHLORIDE 200 MG/10ML IV SOSY
PREFILLED_SYRINGE | INTRAVENOUS | Status: DC | PRN
  Administered 2019-12-04: 140 mg via INTRAVENOUS

## 2019-12-04 MED ORDER — ACETAMINOPHEN 500 MG PO TABS
1000.0000 mg | ORAL_TABLET | ORAL | Status: AC
  Administered 2019-12-04: 1000 mg via ORAL
  Filled 2019-12-04: qty 2

## 2019-12-04 MED ORDER — LABETALOL HCL 5 MG/ML IV SOLN: 5.0000 mg | Freq: Once | INTRAVENOUS | Status: AC

## 2019-12-04 MED ORDER — HEPARIN SODIUM (PORCINE) 5000 UNIT/ML IJ SOLN
5000.0000 [IU] | INTRAMUSCULAR | Status: AC
  Administered 2019-12-04: 5000 [IU] via SUBCUTANEOUS
  Filled 2019-12-04: qty 1

## 2019-12-04 MED ORDER — MIDAZOLAM HCL 2 MG/2ML IJ SOLN
INTRAMUSCULAR | Status: AC
  Filled 2019-12-04: qty 2

## 2019-12-04 MED ORDER — ACETAMINOPHEN 500 MG PO TABS
1000.0000 mg | ORAL_TABLET | Freq: Three times a day (TID) | ORAL | Status: DC
  Administered 2019-12-04 – 2019-12-05 (×2): 1000 mg via ORAL
  Filled 2019-12-04 (×2): qty 2

## 2019-12-04 MED ORDER — HYDROMORPHONE HCL 1 MG/ML IJ SOLN: 0.5000 mg | INTRAMUSCULAR | Status: DC | PRN

## 2019-12-04 MED ORDER — HYDRALAZINE HCL 20 MG/ML IJ SOLN
10.0000 mg | Freq: Once | INTRAMUSCULAR | Status: AC
  Administered 2019-12-04: 10 mg via INTRAVENOUS

## 2019-12-04 MED ORDER — LACTATED RINGERS IR SOLN
Status: DC | PRN
  Administered 2019-12-04: 1

## 2019-12-04 MED ORDER — FENTANYL CITRATE (PF) 250 MCG/5ML IJ SOLN
INTRAMUSCULAR | Status: AC
  Filled 2019-12-04: qty 5

## 2019-12-04 MED ORDER — ONDANSETRON HCL 4 MG/2ML IJ SOLN
INTRAMUSCULAR | Status: AC
  Filled 2019-12-04: qty 2

## 2019-12-04 MED ORDER — GABAPENTIN 100 MG PO CAPS
200.0000 mg | ORAL_CAPSULE | Freq: Two times a day (BID) | ORAL | Status: DC
  Administered 2019-12-04 – 2019-12-05 (×2): 200 mg via ORAL
  Filled 2019-12-04 (×2): qty 2

## 2019-12-04 MED ORDER — SODIUM CHLORIDE 0.9 % IV SOLN: INTRAVENOUS | Status: DC

## 2019-12-04 MED ORDER — HYDROMORPHONE HCL 1 MG/ML IJ SOLN
INTRAMUSCULAR | Status: AC
  Filled 2019-12-04: qty 1

## 2019-12-04 MED ORDER — SODIUM CHLORIDE 0.9 % IV SOLN
2.0000 g | INTRAVENOUS | Status: AC
  Administered 2019-12-04: 2 g via INTRAVENOUS
  Filled 2019-12-04: qty 2

## 2019-12-04 MED ORDER — DEXAMETHASONE SODIUM PHOSPHATE 10 MG/ML IJ SOLN
INTRAMUSCULAR | Status: AC
  Filled 2019-12-04: qty 1

## 2019-12-04 MED ORDER — LIDOCAINE 2% (20 MG/ML) 5 ML SYRINGE
INTRAMUSCULAR | Status: AC
  Filled 2019-12-04: qty 5

## 2019-12-04 MED ORDER — GABAPENTIN 300 MG PO CAPS
300.0000 mg | ORAL_CAPSULE | ORAL | Status: AC
  Administered 2019-12-04: 300 mg via ORAL
  Filled 2019-12-04: qty 1

## 2019-12-04 MED ORDER — PROPOFOL 10 MG/ML IV BOLUS
INTRAVENOUS | Status: AC
  Filled 2019-12-04: qty 40

## 2019-12-04 MED ORDER — BUPIVACAINE-EPINEPHRINE (PF) 0.5% -1:200000 IJ SOLN
INTRAMUSCULAR | Status: DC | PRN
  Administered 2019-12-04: 30 mL

## 2019-12-04 MED ORDER — FENTANYL CITRATE (PF) 100 MCG/2ML IJ SOLN
INTRAMUSCULAR | Status: AC
  Filled 2019-12-04: qty 2

## 2019-12-04 MED ORDER — LIDOCAINE 2% (20 MG/ML) 5 ML SYRINGE
INTRAMUSCULAR | Status: DC | PRN
  Administered 2019-12-04: 100 mg via INTRAVENOUS
  Administered 2019-12-04 (×4): 50 mg via INTRAVENOUS

## 2019-12-04 MED ORDER — ENOXAPARIN SODIUM 30 MG/0.3ML ~~LOC~~ SOLN
30.0000 mg | Freq: Two times a day (BID) | SUBCUTANEOUS | Status: DC
  Administered 2019-12-04 – 2019-12-05 (×2): 30 mg via SUBCUTANEOUS
  Filled 2019-12-04 (×2): qty 0.3

## 2019-12-04 MED ORDER — ENSURE MAX PROTEIN PO LIQD
2.0000 [oz_av] | ORAL | Status: DC
  Administered 2019-12-05 (×2): 2 [oz_av] via ORAL

## 2019-12-04 MED ORDER — LACTATED RINGERS IV SOLN
INTRAVENOUS | Status: DC | PRN
Start: 2019-12-04 — End: 2019-12-04

## 2019-12-04 MED ORDER — HYDRALAZINE HCL 20 MG/ML IJ SOLN
INTRAMUSCULAR | Status: AC
  Filled 2019-12-04: qty 1

## 2019-12-04 MED ORDER — DOCUSATE SODIUM 100 MG PO CAPS
100.0000 mg | ORAL_CAPSULE | Freq: Two times a day (BID) | ORAL | Status: DC
  Administered 2019-12-04 – 2019-12-05 (×2): 100 mg via ORAL
  Filled 2019-12-04 (×2): qty 1

## 2019-12-04 MED ORDER — LIDOCAINE 2% (20 MG/ML) 5 ML SYRINGE
INTRAMUSCULAR | Status: DC | PRN
  Administered 2019-12-04: 1.5 mg/kg/h via INTRAVENOUS

## 2019-12-04 MED ORDER — PANTOPRAZOLE SODIUM 40 MG IV SOLR
40.0000 mg | Freq: Every day | INTRAVENOUS | Status: DC
  Administered 2019-12-04: 40 mg via INTRAVENOUS
  Filled 2019-12-04: qty 40

## 2019-12-04 MED ORDER — BUPIVACAINE LIPOSOME 1.3 % IJ SUSP
INTRAMUSCULAR | Status: DC | PRN
  Administered 2019-12-04: 20 mL

## 2019-12-04 MED ORDER — MIDAZOLAM HCL 5 MG/5ML IJ SOLN
INTRAMUSCULAR | Status: DC | PRN
  Administered 2019-12-04: 2 mg via INTRAVENOUS

## 2019-12-04 MED ORDER — APREPITANT 40 MG PO CAPS
40.0000 mg | ORAL_CAPSULE | ORAL | Status: AC
  Administered 2019-12-04: 40 mg via ORAL
  Filled 2019-12-04: qty 1

## 2019-12-04 MED ORDER — ONDANSETRON HCL 4 MG/2ML IJ SOLN
4.0000 mg | INTRAMUSCULAR | Status: DC | PRN
  Administered 2019-12-05: 4 mg via INTRAVENOUS
  Filled 2019-12-04: qty 2

## 2019-12-04 MED ORDER — HYDROMORPHONE HCL 1 MG/ML IJ SOLN
0.2500 mg | INTRAMUSCULAR | Status: DC | PRN
  Administered 2019-12-04 (×2): 0.5 mg via INTRAVENOUS

## 2019-12-04 MED ORDER — SUGAMMADEX SODIUM 500 MG/5ML IV SOLN
INTRAVENOUS | Status: AC
  Filled 2019-12-04: qty 5

## 2019-12-04 MED ORDER — METOCLOPRAMIDE HCL 5 MG/ML IJ SOLN
10.0000 mg | Freq: Four times a day (QID) | INTRAMUSCULAR | Status: DC
  Administered 2019-12-04 – 2019-12-05 (×4): 10 mg via INTRAVENOUS
  Filled 2019-12-04 (×5): qty 2

## 2019-12-04 MED ORDER — PROMETHAZINE HCL 25 MG/ML IJ SOLN: 6.2500 mg | INTRAMUSCULAR | Status: DC | PRN

## 2019-12-04 MED ORDER — HYDRALAZINE HCL 20 MG/ML IJ SOLN: 10.0000 mg | INTRAMUSCULAR | Status: DC | PRN

## 2019-12-04 MED ORDER — KETAMINE HCL 10 MG/ML IJ SOLN
INTRAMUSCULAR | Status: AC
  Filled 2019-12-04: qty 1

## 2019-12-04 MED ORDER — ACETAMINOPHEN 160 MG/5ML PO SOLN
1000.0000 mg | Freq: Three times a day (TID) | ORAL | Status: DC
  Administered 2019-12-04 – 2019-12-05 (×2): 1000 mg via ORAL
  Filled 2019-12-04 (×3): qty 40.6

## 2019-12-04 MED ORDER — METOPROLOL TARTRATE 5 MG/5ML IV SOLN: 5.0000 mg | Freq: Four times a day (QID) | INTRAVENOUS | Status: DC | PRN

## 2019-12-04 MED ORDER — FENTANYL CITRATE (PF) 250 MCG/5ML IJ SOLN
INTRAMUSCULAR | Status: DC | PRN
  Administered 2019-12-04: 100 ug via INTRAVENOUS

## 2019-12-04 MED ORDER — KETAMINE HCL 10 MG/ML IJ SOLN
INTRAMUSCULAR | Status: DC | PRN
Start: 2019-12-04 — End: 2019-12-04
  Administered 2019-12-04: 30 mg via INTRAVENOUS

## 2019-12-04 MED ORDER — PROPOFOL 10 MG/ML IV BOLUS
INTRAVENOUS | Status: DC | PRN
  Administered 2019-12-04: 120 mg via INTRAVENOUS
  Administered 2019-12-04: 80 mg via INTRAVENOUS

## 2019-12-04 MED ORDER — MEPERIDINE HCL 50 MG/ML IJ SOLN: 6.2500 mg | INTRAMUSCULAR | Status: DC | PRN

## 2019-12-04 MED ORDER — ROCURONIUM BROMIDE 10 MG/ML (PF) SYRINGE
PREFILLED_SYRINGE | INTRAVENOUS | Status: DC | PRN
  Administered 2019-12-04: 20 mg via INTRAVENOUS
  Administered 2019-12-04: 50 mg via INTRAVENOUS
  Administered 2019-12-04: 10 mg via INTRAVENOUS

## 2019-12-04 MED ORDER — LABETALOL HCL 5 MG/ML IV SOLN
INTRAVENOUS | Status: AC
  Administered 2019-12-04: 5 mg via INTRAVENOUS
  Filled 2019-12-04: qty 4

## 2019-12-04 MED ORDER — SIMETHICONE 80 MG PO CHEW: 80.0000 mg | CHEWABLE_TABLET | Freq: Four times a day (QID) | ORAL | Status: DC | PRN

## 2019-12-04 MED ORDER — ROCURONIUM BROMIDE 10 MG/ML (PF) SYRINGE
PREFILLED_SYRINGE | INTRAVENOUS | Status: AC
  Filled 2019-12-04: qty 10

## 2019-12-04 MED ORDER — BUPIVACAINE-EPINEPHRINE 0.5% -1:200000 IJ SOLN
INTRAMUSCULAR | Status: AC
  Filled 2019-12-04: qty 1

## 2019-12-04 MED ORDER — EPHEDRINE SULFATE-NACL 50-0.9 MG/10ML-% IV SOSY
PREFILLED_SYRINGE | INTRAVENOUS | Status: DC | PRN
  Administered 2019-12-04: 10 mg via INTRAVENOUS

## 2019-12-04 SURGICAL SUPPLY — 77 items
APL PRP STRL LF DISP 70% ISPRP (MISCELLANEOUS) ×4
APL SKNCLS STERI-STRIP NONHPOA (GAUZE/BANDAGES/DRESSINGS) ×2
APL SWBSTK 6 STRL LF DISP (MISCELLANEOUS)
APPLICATOR COTTON TIP 6 STRL (MISCELLANEOUS) IMPLANT
APPLICATOR COTTON TIP 6IN STRL (MISCELLANEOUS)
APPLIER CLIP ROT 10 11.4 M/L (STAPLE)
APPLIER CLIP ROT 13.4 12 LRG (CLIP)
APR CLP LRG 13.4X12 ROT 20 MLT (CLIP)
APR CLP MED LRG 11.4X10 (STAPLE)
BAG LAPAROSCOPIC 12 15 PORT 16 (BASKET) IMPLANT
BAG RETRIEVAL 12/15 (BASKET) ×3
BENZOIN TINCTURE PRP APPL 2/3 (GAUZE/BANDAGES/DRESSINGS) ×3 IMPLANT
BLADE SURG SZ11 CARB STEEL (BLADE) ×3 IMPLANT
BNDG ADH 1X3 SHEER STRL LF (GAUZE/BANDAGES/DRESSINGS) ×18 IMPLANT
BNDG ADH THN 3X1 STRL LF (GAUZE/BANDAGES/DRESSINGS) ×12
CABLE HIGH FREQUENCY MONO STRZ (ELECTRODE) ×3 IMPLANT
CHLORAPREP W/TINT 26 (MISCELLANEOUS) ×6 IMPLANT
CLIP APPLIE ROT 10 11.4 M/L (STAPLE) IMPLANT
CLIP APPLIE ROT 13.4 12 LRG (CLIP) IMPLANT
COVER SURGICAL LIGHT HANDLE (MISCELLANEOUS) ×3 IMPLANT
COVER WAND RF STERILE (DRAPES) IMPLANT
DECANTER SPIKE VIAL GLASS SM (MISCELLANEOUS) ×3 IMPLANT
DEVICE SUT QUICK LOAD TK 5 (STAPLE) IMPLANT
DEVICE SUT TI-KNOT TK 5X26 (MISCELLANEOUS) IMPLANT
DRAPE UTILITY XL STRL (DRAPES) ×6 IMPLANT
ELECT REM PT RETURN 15FT ADLT (MISCELLANEOUS) ×3 IMPLANT
GAUZE SPONGE 4X4 12PLY STRL (GAUZE/BANDAGES/DRESSINGS) IMPLANT
GLOVE BIO SURGEON STRL SZ 6 (GLOVE) ×3 IMPLANT
GLOVE INDICATOR 6.5 STRL GRN (GLOVE) ×3 IMPLANT
GOWN STRL REUS W/TWL LRG LVL3 (GOWN DISPOSABLE) ×3 IMPLANT
GOWN STRL REUS W/TWL XL LVL3 (GOWN DISPOSABLE) ×6 IMPLANT
GRASPER SUT TROCAR 14GX15 (MISCELLANEOUS) ×3 IMPLANT
HOVERMATT SINGLE USE (MISCELLANEOUS) ×3 IMPLANT
KIT BASIN (CUSTOM PROCEDURE TRAY) ×3 IMPLANT
KIT TURNOVER KIT A (KITS) IMPLANT
MARKER SKIN DUAL TIP RULER LAB (MISCELLANEOUS) ×3 IMPLANT
NDL SPNL 22GX3.5 QUINCKE BK (NEEDLE) ×2 IMPLANT
NEEDLE SPNL 22GX3.5 QUINCKE BK (NEEDLE) ×3 IMPLANT
PACK UNIVERSAL I (CUSTOM PROCEDURE TRAY) ×3 IMPLANT
PENCIL SMOKE EVACUATOR (MISCELLANEOUS) IMPLANT
RELOAD ENDO STITCH (ENDOMECHANICALS) IMPLANT
RELOAD STAPLE 60 3.6 BLU REG (STAPLE) ×2 IMPLANT
RELOAD STAPLE 60 3.8 GOLD REG (STAPLE) ×2 IMPLANT
RELOAD STAPLE 60 4.1 GRN THCK (STAPLE) IMPLANT
RELOAD STAPLER BLUE 60MM (STAPLE) ×6 IMPLANT
RELOAD STAPLER GOLD 60MM (STAPLE) ×2 IMPLANT
RELOAD STAPLER GREEN 60MM (STAPLE) ×2 IMPLANT
RELOAD SUT TRIPLE-STITCH 2-0 (ENDOMECHANICALS) IMPLANT
SCISSORS LAP 5X45 EPIX DISP (ENDOMECHANICALS) ×3 IMPLANT
SET IRRIG TUBING LAPAROSCOPIC (IRRIGATION / IRRIGATOR) ×3 IMPLANT
SET TUBE SMOKE EVAC HIGH FLOW (TUBING) ×3 IMPLANT
SHEARS HARMONIC ACE PLUS 45CM (MISCELLANEOUS) ×3 IMPLANT
SLEEVE ADV FIXATION 5X100MM (TROCAR) ×6 IMPLANT
SLEEVE GASTRECTOMY 40FR VISIGI (MISCELLANEOUS) ×3 IMPLANT
SOL ANTI FOG 6CC (MISCELLANEOUS) ×2 IMPLANT
SOLUTION ANTI FOG 6CC (MISCELLANEOUS) ×1
SPONGE LAP 18X18 RF (DISPOSABLE) ×3 IMPLANT
STAPLER ECHELON BIOABSB 60 FLE (MISCELLANEOUS) ×14 IMPLANT
STAPLER ECHELON LONG 60 440 (INSTRUMENTS) ×3 IMPLANT
STAPLER RELOAD BLUE 60MM (STAPLE) ×9
STAPLER RELOAD GOLD 60MM (STAPLE) ×3
STAPLER RELOAD GREEN 60MM (STAPLE) ×3
STRIP CLOSURE SKIN 1/2X4 (GAUZE/BANDAGES/DRESSINGS) ×3 IMPLANT
SUT MNCRL AB 4-0 PS2 18 (SUTURE) ×3 IMPLANT
SUT SURGIDAC NAB ES-9 0 48 120 (SUTURE) IMPLANT
SUT VICRYL 0 TIES 12 18 (SUTURE) ×3 IMPLANT
SYR 10ML ECCENTRIC (SYRINGE) ×3 IMPLANT
SYR 20ML LL LF (SYRINGE) ×3 IMPLANT
SYR 50ML LL SCALE MARK (SYRINGE) ×3 IMPLANT
TAPE STRIPS DRAPE STRL (GAUZE/BANDAGES/DRESSINGS) ×1 IMPLANT
TOWEL OR 17X26 10 PK STRL BLUE (TOWEL DISPOSABLE) ×3 IMPLANT
TOWEL OR NON WOVEN STRL DISP B (DISPOSABLE) ×3 IMPLANT
TROCAR ADV FIXATION 5X100MM (TROCAR) ×3 IMPLANT
TROCAR BLADELESS 15MM (ENDOMECHANICALS) ×3 IMPLANT
TROCAR BLADELESS OPT 5 100 (ENDOMECHANICALS) ×3 IMPLANT
TUBING CONNECTING 10 (TUBING) ×3 IMPLANT
TUBING ENDO SMARTCAP (MISCELLANEOUS) ×3 IMPLANT

## 2019-12-04 NOTE — Progress Notes (Signed)
Patient has ambulated, voided, is using her incentive spirometer, and her pain is controlled. Patient has started drinking first 2oz cup of water at 1300.

## 2019-12-04 NOTE — Transfer of Care (Signed)
Immediate Anesthesia Transfer of Care Note  Patient: Amy Bentley  Procedure(s) Performed: LAPAROSCOPIC GASTRIC SLEEVE RESECTION, Upper Endo, Eras Pathway (N/A Abdomen) UPPER GI ENDOSCOPY (N/A )  Patient Location: PACU  Anesthesia Type:General  Level of Consciousness: awake, drowsy and patient cooperative  Airway & Oxygen Therapy: Patient Spontanous Breathing and Patient connected to face mask oxygen  Post-op Assessment: Report given to RN and Post -op Vital signs reviewed and stable  Post vital signs: Reviewed and stable  Last Vitals:  Vitals Value Taken Time  BP 170/108 12/04/19 0935  Temp    Pulse 85 12/04/19 0938  Resp 17 12/04/19 0938  SpO2 100 % 12/04/19 0938  Vitals shown include unvalidated device data.  Last Pain:  Vitals:   12/04/19 0537  TempSrc:   PainSc: 0-No pain         Complications: No apparent anesthesia complications

## 2019-12-04 NOTE — Op Note (Signed)
Operative Note  Amy Bentley  [id number]  [id number]  12/04/2019   Surgeon: Clovis Riley MD   Assistant: Greer Pickerel MD   Procedure performed: laparoscopic sleeve gastrectomy   Preop diagnosis: Morbid obesity Body mass index is 59.22 kg/m. Post-op diagnosis/intraop findings: same, swiss cheese midline ventral hernia defects   Specimens: fundus Retained items: none  EBL: minimal cc Complications: none   Description of procedure: After obtaining informed consent and administration of chemical DVT prophylaxis in holding, the patient was taken to the operating room and placed supine on operating room table where general endotracheal anesthesia was initiated, preoperative antibiotics were administered, SCDs applied, and a formal timeout was performed. The abdomen was prepped and draped in usual sterile fashion. Peritoneal access was gained using a Visiport technique in the left upper quadrant and insufflation to 15 mmHg ensued without issue. Gross inspection revealed no evidence of injury.  There are adhesions of a single loop of small bowel in the upper midline as well as between the transverse colon and the falciform ligament.  The liver is enlarged consistent with hepatic steatosis.  There is a Swiss cheese type midline ventral hernia defects. Under direct visualization the 3mm right lateral and 61mm right paramedian trochars were inserted. Bilateral laparoscopic assisted TAPS blocks were performed with Exparel diluted with 0.5 percent Marcaine with epinephrine.  The camera was then moved to the right-sided port and adhesiolysis was undertaken using cold scissors to dissect the small bowel away from its adhesion to the anterior abdominal wall.  Fortunately these adhesions were thin and we were able to stay well away from the serosa.  No bowel injury was incurred.  We were then able to place the left lateral and left paramedian 5 mm trochars under direct visualization.  Then returned  the camera to the right side and the adhesion of the colon to the falciform ligament was taken down using blunt dissection, sharp dissection, and then harmonic scalpel on the falciform, ensuring no injury to the colon.  This freed up the anterior abdominal wall completely.  Swiss cheese ventral hernia defects, at least 5 small defects were visualized.  The patient was placed in steep Trendelenburg and the liver retractor was introduced through an incision in the upper midline and secured to the post externally to maintain the left lobe retracted anteriorly.  There is no hiatal hernia. Using the Harmonic scalpel, the greater curvature of the stomach was dissected away from the greater omentum and short gastric vessels were divided. This began 6 cm from the pylorus, and dissection proceeded until the left crus was clearly exposed. There were some filmy adhesions of the posterior stomach to the pancreas which were divided with the Harmonic.  The 4 [place] VisiGi was then introduced and directed down towards the pylorus. This was placed to suction against the lesser curve. Serial fires of the linear cutting stapler with seamguards were then employed to create our sleeve. The first fire used a green load and ensured adequate room at the angularis incisura. One gold load and then several blue loads were then employed to create a narrow tubular stomach up to the angle of His. The excised stomach was then removed through our 15 mm trocar site within an Endo Catch bag. The visigi was taken off of suction and a few puffs of air were introduced, inflating the sleeve. No bubbles were observed in the irrigation fluid around the stomach and the shape was noted to be tubular without any excessive narrowing  at the angularis. The visigi was then removed. Upper endoscopy was attempted by the assistant surgeon and myself, but we were unable to pass the scope into the esophagus after multiple attempts so this was aborted. The 15 mm  trocar site fascia in the right upper abdomen was closed with 2 interrupted sutures of 0 Vicryl using the laparoscopic suture passer under direct visualization. The liver retractor was removed under direct visualization.  The midline Swiss cheese ventral hernia defect was inspected.  A stab incision was made over the inferior most of these defects and I attempted to close the peritoneum with the laparoscopic suture passer with a 0 Vicryl, however there was far too much tension and I did not think this would hold, in addition to which there are at least 4 other small defects.  The abdomen was then desufflated and all remaining trochars removed. The skin incisions were closed with subcuticular Monocryl; benzoin, Steri-Strips and Band-Aids were applied The patient was then awakened, extubated and taken to PACU in stable condition.     All counts were correct at the completion of the case.

## 2019-12-04 NOTE — Progress Notes (Signed)
PHARMACY CONSULT FOR:  Risk Assessment for Post-Discharge VTE Following Bariatric Surgery  Post-Discharge VTE Risk Assessment: This patient's probability of 30-day post-discharge VTE is increased due to the factors marked:   Female    Age >/=60 years   x BMI >/=50 kg/m2    CHF    Dyspnea at Rest    Paraplegia   x Non-gastric-band surgery    Operation Time >/=3 hr    Return to OR     Length of Stay >/= 3 d      Hx of VTE   Hypercoagulable condition   Significant venous stasis   Predicted probability of 30-day post-discharge VTE: - 0.27%  Other patient-specific factors to consider: - No listed history of VTE   Recommendation for Discharge: No pharmacologic prophylaxis post-discharge        Amy Bentley is a 49 y.o. female who underwent  laparoscopic sleeve gastrectomy on 12/04/2019    Case start: 0735 Case end: 0919   Allergies  Allergen Reactions  . Bactrim [Sulfamethoxazole-Trimethoprim] Other (See Comments)    Causes disorientation, inability to speak    Patient Measurements: Weight: (!) 142.2 kg (313 lb 6.4 oz) Body mass index is 59.22 kg/m.  No results for input(s): WBC, HGB, HCT, PLT, APTT, CREATININE, LABCREA, CREATININE, CREAT24HRUR, MG, PHOS, ALBUMIN, PROT, ALBUMIN, AST, ALT, ALKPHOS, BILITOT, BILIDIR, IBILI in the last 72 hours. Estimated Creatinine Clearance: 102.2 mL/min (by C-G formula based on SCr of 0.92 mg/dL).    Past Medical History:  Diagnosis Date  . colon ca dx'd 09/2013   colon  . GERD (gastroesophageal reflux disease)   . Hyperlipidemia   . Morbid obesity ([place])      Medications Prior to Admission  Medication Sig Dispense Refill Last Dose  . acetaminophen (TYLENOL) 650 MG CR tablet Take 1,300 mg by mouth every 8 (eight) hours as needed for pain.   Past Week at Unknown time  . diphenhydrAMINE (BENADRYL) 25 MG tablet Take 25 mg by mouth daily as needed for allergies.   Past Week at Unknown time       Royetta Asal,  PharmD, BCPS 12/04/2019 1:43 PM

## 2019-12-04 NOTE — Anesthesia Postprocedure Evaluation (Signed)
Anesthesia Post Note  Patient: Marleigh Kaylor  Procedure(s) Performed: LAPAROSCOPIC GASTRIC SLEEVE RESECTION, Upper Endo, Eras Pathway (N/A Abdomen) UPPER GI ENDOSCOPY (N/A )     Patient location during evaluation: PACU Anesthesia Type: General Level of consciousness: awake and alert Pain management: pain level controlled Vital Signs Assessment: post-procedure vital signs reviewed and stable Respiratory status: spontaneous breathing, nonlabored ventilation, respiratory function stable and patient connected to nasal cannula oxygen Cardiovascular status: blood pressure returned to baseline and stable Postop Assessment: no apparent nausea or vomiting Anesthetic complications: no    Last Vitals:  Vitals:   12/04/19 1115 12/04/19 1130  BP: (!) 160/107 (!) 168/101  Pulse: 79 79  Resp: (!) 23 19  Temp:    SpO2: 97% 97%    Last Pain:  Vitals:   12/04/19 1130  TempSrc:   PainSc: [place] A Darlin Stenseth

## 2019-12-04 NOTE — H&P (Signed)
Surgical Evaluation  Chief Complaint: obesity  HPI: returns to discuss surgical management of severe obesity. She has completed the preoperative pathway with no barriers identified. She reports no changes in her health since we last met in October.  Chest x-ray and upper GI negative/no HH  She has met with the dietitians as well as psychology and is approved from their standpoint.  She has also been evaluated by cardiology (Dr. Irish Lack) and has been cleared from their standpoint as well  Labs from March reviewed  Initial Visit 04/27/19: This is a very pleasant 49 year old woman who presents today with 2 concerns, one is recurrent incisional hernia and the other is morbid obesity.  She underwent an open colon resection in 2015 with Dr. [name] for colon cancer. This involved a partial cystectomy as she had developed a colovesical fistula. She subsequently had chemotherapy, has maintained colonoscopies and is currently as far she knows with no evidence of recurrence. In January 2019 she developed an incarcerated ventral hernia which Dr. Excell Seltzer repaired- he did a retrorectus repair with phasix mesh, secured with Prolenes, fascia closed with PDS. That hospitalization was complicated by acute renal failure with a creatinine as high as 12, from which she ultimately recovered.  She has been noticing a bulge in her epigastrium when she coughs as well as concern of a hernia at the lower aspect of her incision. The upper hernia is uncomfortable at times. Given her difficult course with the previous hernia from which she was told to wait until his problem, she is hoping to avoid going through that again. Other abdominal surgeries include tubal ligation and C-section.  She is also interested in surgical management of morbid obesity. She has been struggling with this disease for her entire life. She has tried numerous diets and weight loss programs without significant success. She has been considering bariatric  surgery for several years.  Endorses occasional reflux related to certain foods, but no other chronic GI symptoms. She is interested in the sleeve gastrectomy and has done a fair amount of research about this. She endorses arthralgias. She has a history of hyperlipidemia, chronic kidney disease, anemia.  Denies alcohol or drug use, quit smoking 5 years ago.  309lb/ BMI 58.4  Discussed that hernia repairs best deferred until significant weight loss has been achieved to reduce the risk of recurrence. She has pain and subjective bulge in the abdominal wall but on exam and not able to palpate any discrete hernia. She is strongly interested in and is an excellent candidate for the bariatric pathway and I discussed with her sleeve gastrectomy. We discussed the surgery including technical aspects, the risks of bleeding, infection, pain, scarring, injury to intra-abdominal structures, staple line leak or abscess, chronic abdominal pain or nausea, new onset or worsened GERD, DVT/PE, pneumonia, heart attack, stroke, death, failure to reach weight loss goals and weight regain, hernia. Discussed the typical pre-, peri-, and postoperative course. Discussed the importance of lifelong behavioral changes to combat the chronic and relapsing disease which is obesity. Questions were welcomed and answered to her satisfaction. We will initiate the bariatric pathway she is scheduled to attend the seminar next week and has already completed the majority of the necessary paperwork which she brought with her today. We'll see if our bariatric coordinator's are able to meet with her today as well       Allergies  Allergen Reactions  . Bactrim [Sulfamethoxazole-Trimethoprim] Other (See Comments)    Causes disorientation, inability to speak  Past Medical History:  Diagnosis Date  . Anemia   . Chronic kidney disease    hx UTIs  . colon ca dx'd 09/2013  . GERD (gastroesophageal reflux disease)   . Hyperlipidemia          Past Surgical History:  Procedure Laterality Date  . BREAST BIOPSY Right    stereo   . CESAREAN SECTION     x 2  . COLON RESECTION N/A 10/20/2013   Procedure: OPEN SIGMOID COLONECTOMY COLOVESICAL FISTULA REPAIR; Surgeon: Stark Klein, MD; Location: [facility]; Service: General; Laterality: N/A;  . COLONOSCOPY  08/16/2014  . COLONOSCOPY WITH PROPOFOL N/A 08/16/2014   Procedure: COLONOSCOPY WITH PROPOFOL; Surgeon: Milus Banister, MD; Location: [facility]; Service: Endoscopy; Laterality: N/A;  . COLONOSCOPY WITH PROPOFOL N/A 12/23/2017   Procedure: COLONOSCOPY WITH PROPOFOL; Surgeon: Milus Banister, MD; Location: [facility]; Service: Endoscopy; Laterality: N/A;  . CYSTECTOMY N/A 10/20/2013   Procedure: CYSTECTOMY PARTIAL; Surgeon: Ardis Hughs, MD; Location: [facility]; Service: Urology; Laterality: N/A;  . PORTACATH PLACEMENT N/A 11/15/2013   Procedure: INSERTION PORT-A-CATH; Surgeon: Stark Klein, MD; Location: [facility]; Service: General; Laterality: N/A;  . TUBAL LIGATION    . VENTRAL HERNIA REPAIR N/A 07/21/2017   Procedure: EXPLORATORY LAPAROTOMY AND LYSIS OF ADHESIONS WITH REPAIR OF VENTRAL HERNIA WITH MESH ; Surgeon: Excell Seltzer, MD; Location: [facility]; Service: General; Laterality: N/A;        Family History  Problem Relation Age of Onset  . Cancer Paternal Aunt 50   colon  . Colon cancer Paternal Aunt 85  . Hypertension Other   . Diabetes Other   . Esophageal cancer Neg Hx   . Rectal cancer Neg Hx   . Stomach cancer Neg Hx   . Breast cancer Neg Hx    Social History        Socioeconomic History  . Marital status: Married    Spouse name: Not on file  . Number of children: Not on file  . Years of education: Not on file  . Highest education level: Not on file  Occupational History  . Not on file  Tobacco Use  . Smoking status: Former Smoker    Packs/day: 0.50    Types: Cigarettes    Quit date: 10/17/2013    Years since quitting: 5.9  . Smokeless tobacco: Never  Used  Substance and Sexual Activity  . Alcohol use: No  . Drug use: No  . Sexual activity: Never  Other Topics Concern  . Not on file  Social History Narrative  . Not on file   Social Determinants of Health      Financial Resource Strain:   . Difficulty of Paying Living Expenses:   Food Insecurity:   . Worried About Charity fundraiser in the Last Year:   . Arboriculturist in the Last Year:   Transportation Needs:   . Film/video editor (Medical):   Marland Kitchen Lack of Transportation (Non-Medical):   Physical Activity:   . Days of Exercise per Week:   . Minutes of Exercise per Session:   Stress:   . Feeling of Stress :   Social Connections:   . Frequency of Communication with Friends and Family:   . Frequency of Social Gatherings with Friends and Family:   . Attends Religious Services:   . Active Member of Clubs or Organizations:   . Attends Archivist Meetings:   Marland Kitchen Marital Status:  Current Outpatient Medications on File Prior to Visit  Medication Sig Dispense Refill  . diphenhydrAMINE (BENADRYL) 25 MG tablet Take 25 mg by mouth daily as needed for allergies.    Marland Kitchen ibuprofen (ADVIL,MOTRIN) 200 MG tablet Take 600 mg by mouth 2 (two) times daily as needed for headache, mild pain or moderate pain.      No current facility-administered medications on file prior to visit.   Review of Systems: a complete, 10pt review of systems was completed with pertinent positives and negatives as documented in the HPI  Physical Exam:  Weight: 321.13 lb Height: 61 in  Body Surface Area: 2.31 m Body Mass Index: 60.68 kg/m  Temp.: 97.6 F (Oral) Pulse: 106 (Regular)  BP: 156/96(Sitting, Left Arm, Standard)  Alert and well appearing  unlabored respirations  CBC Latest Ref Rng & Units 07/24/2017 07/23/2017 07/22/2017  WBC 4.0 - 10.5 K/uL 8.0 10.4 12.6(H)  Hemoglobin 12.0 - 15.0 g/dL 10.1(L) 10.4(L) 12.8  Hematocrit 36.0 - 46.0 % 31.1(L) 32.4(L) 37.7  Platelets 150 - 400 K/uL  177 167 199   CMP Latest Ref Rng & Units 07/25/2017 07/24/2017 07/23/2017  Glucose 65 - 99 mg/dL 121(H) 106(H) 130(H)  BUN 6 - 20 mg/dL 18 26(H) 48(H)  Creatinine 0.44 - 1.00 mg/dL 1.15(H) 1.65(H) 4.11(H)  Sodium 135 - 145 mmol/L 140 134(L) 140  Potassium 3.5 - 5.1 mmol/L 3.9 3.4(L) 3.3(L)  Chloride 101 - 111 mmol/L 109 101 100(L)  CO2 22 - 32 mmol/L _0 Calcium 8.9 - 10.3 mg/dL 8.0(L) 7.7(L) 7.4(L)  Total Protein 6.5 - 8.1 g/dL - - -  Total Bilirubin 0.3 - 1.2 mg/dL - - -  Alkaline Phos 38 - 126 U/L - - -  AST 15 - 41 U/L - - -  ALT 14 - 54 U/L - - -   Recent Labs                      Imaging:  Imaging Results (Last 48 hours)    A/P: MORBID OBESITY (E66.01)  Story: She remains a good candidate for sleeve gastrectomy. We had previously discussed the surgery including technical aspects, the risks of bleeding, infection, pain, scarring, injury to intra-abdominal structures, staple line leak or abscess, chronic abdominal pain or nausea, new onset or worsened GERD, DVT/PE, pneumonia, heart attack, stroke, death, failure to reach weight loss goals and weight regain, hernia. Discussed again today the typical peri-, and postoperative course. Discussed the importance of lifelong behavioral changes to combat the chronic and relapsing disease which is obesity. Questions were welcomed and answered to her satisfaction. She is ready to proceed next week.      Patient Active Problem List   Diagnosis Date Noted  . History of colon cancer   . S/P laparoscopic procedure 07/21/2017  . Incisional hernia, reducible 07/16/2017  . Nausea and vomiting 07/12/2017  . Malignant neoplasm of sigmoid colon ([place]) 03/13/2014  . Family history of malignant neoplasm of gastrointestinal tract 03/13/2014  . 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 Protein-calorie malnutrition, severe ([place]) 10/19/2013  . Iron deficiency anemia, multifactorial with GI/GU and  menstrual losses contributory 10/18/2013  . Diverticulitis of colon (without mention of hemorrhage)(562.11) 10/18/2013  . Weight loss 10/17/2013  . Altered mental status 08/07/2013  . UTI (lower urinary tract infection) 08/07/2013  . Hypokalemia 08/07/2013  . Anemia 08/07/2013   Romana Juniper, MD  [facility], [place]  See  AMION to contact appropriate on-call provider

## 2019-12-04 NOTE — Anesthesia Procedure Notes (Signed)
Procedure Name: Intubation Date/Time: 12/04/2019 7:22 AM Performed by: West Pugh, CRNA Pre-anesthesia Checklist: Patient identified, Emergency Drugs available, Suction available, Patient being monitored and Timeout performed Patient Re-evaluated:Patient Re-evaluated prior to induction Oxygen Delivery Method: Circle system utilized Preoxygenation: Pre-oxygenation with 100% oxygen Induction Type: IV induction and Rapid sequence Laryngoscope Size: Mac and 4 Grade View: Grade I Tube type: Oral Tube size: 7.0 mm Number of attempts: 1 Airway Equipment and Method: Stylet Placement Confirmation: ETT inserted through vocal cords under direct vision,  positive ETCO2,  CO2 detector and breath sounds checked- equal and bilateral Secured at: 22 cm Tube secured with: Tape Dental Injury: Teeth and Oropharynx as per pre-operative assessment

## 2019-12-04 NOTE — Progress Notes (Addendum)
Patient alert and oriented, operative day.  Provided support and encouragement.  Encouraged pulmonary toilet, ambulation and small sips of liquids.  Pt given bariatric surgery post op food plan.  All questions answered.  Will continue to monitor.

## 2019-12-05 LAB — COMPREHENSIVE METABOLIC PANEL
ALT: 45 U/L — ABNORMAL HIGH (ref 0–44)
AST: 53 U/L — ABNORMAL HIGH (ref 15–41)
Albumin: 4.1 g/dL (ref 3.5–5.0)
Alkaline Phosphatase: 94 U/L (ref 38–126)
Anion gap: 12 (ref 5–15)
BUN: 11 mg/dL (ref 6–20)
CO2: 21 mmol/L — ABNORMAL LOW (ref 22–32)
Calcium: 8.8 mg/dL — ABNORMAL LOW (ref 8.9–10.3)
Chloride: 103 mmol/L (ref 98–111)
Creatinine, Ser: 0.82 mg/dL (ref 0.44–1.00)
GFR calc Af Amer: >60 mL/min (ref >60)
GFR calc non Af Amer: >60 mL/min (ref >60)
Glucose, Bld: 112 mg/dL — ABNORMAL HIGH (ref 70–99)
Potassium: 4.9 mmol/L (ref 3.5–5.1)
Sodium: 136 mmol/L (ref 135–145)
Total Bilirubin: 1.1 mg/dL (ref 0.3–1.2)
Total Protein: 7.9 g/dL (ref 6.5–8.1)

## 2019-12-05 LAB — CBC WITH DIFFERENTIAL/PLATELET
Abs Immature Granulocytes: 0.03 10*3/uL (ref 0.00–0.07)
Basophils Absolute: 0 10*3/uL (ref 0.0–0.1)
Basophils Relative: 0 %
Eosinophils Absolute: 0 10*3/uL (ref 0.0–0.5)
Eosinophils Relative: 0 %
HCT: 42 % (ref 36.0–46.0)
Hemoglobin: 13.3 g/dL (ref 12.0–15.0)
Immature Granulocytes: 0 %
Lymphocytes Relative: 14 %
Lymphs Abs: 1.4 10*3/uL (ref 0.7–4.0)
MCH: 26.9 pg (ref 26.0–34.0)
MCHC: 31.7 g/dL (ref 30.0–36.0)
MCV: 85 fL (ref 80.0–100.0)
Monocytes Absolute: 0.5 10*3/uL (ref 0.1–1.0)
Monocytes Relative: 5 %
Neutro Abs: 8.2 10*3/uL — ABNORMAL HIGH (ref 1.7–7.7)
Neutrophils Relative %: 81 %
Platelets: 243 10*3/uL (ref 150–400)
RBC: 4.94 MIL/uL (ref 3.87–5.11)
RDW: 15.6 % — ABNORMAL HIGH (ref 11.5–15.5)
WBC: 10.1 10*3/uL (ref 4.0–10.5)
nRBC: 0 % (ref 0.0–0.2)

## 2019-12-05 LAB — MAGNESIUM: Magnesium: 2.1 mg/dL (ref 1.7–2.4)

## 2019-12-05 LAB — SURGICAL PATHOLOGY

## 2019-12-05 MED ORDER — DOCUSATE SODIUM 100 MG PO CAPS: 100.0000 mg | ORAL_CAPSULE | Freq: Two times a day (BID) | ORAL | 0 refills | Status: DC

## 2019-12-05 MED ORDER — TRAMADOL HCL 50 MG PO TABS
50.0000 mg | ORAL_TABLET | Freq: Four times a day (QID) | ORAL | 0 refills | Status: DC | PRN
Start: 2019-12-05 — End: 2022-01-29

## 2019-12-05 MED ORDER — PANTOPRAZOLE SODIUM 40 MG PO TBEC
40.0000 mg | DELAYED_RELEASE_TABLET | Freq: Every day | ORAL | 0 refills | Status: DC
Start: 2019-12-05 — End: 2022-01-29

## 2019-12-05 MED ORDER — GABAPENTIN 100 MG PO CAPS
200.0000 mg | ORAL_CAPSULE | Freq: Two times a day (BID) | ORAL | 0 refills | Status: DC
Start: 2019-12-05 — End: 2022-11-11

## 2019-12-05 MED ORDER — ACETAMINOPHEN 500 MG PO TABS: 1000.0000 mg | ORAL_TABLET | Freq: Three times a day (TID) | ORAL | 0 refills | Status: AC

## 2019-12-05 MED ORDER — ONDANSETRON 4 MG PO TBDP: 4.0000 mg | ORAL_TABLET | Freq: Four times a day (QID) | ORAL | 0 refills | Status: DC | PRN

## 2019-12-05 NOTE — Discharge Instructions (Signed)
° ° ° °GASTRIC BYPASS/SLEEVE ° Home Care Instructions ° ° These instructions are to help you care for yourself when you go home. ° °Call: If you have any problems. °• Call [phone] and ask for the surgeon on call °• If you need immediate help, come to the ER at [facility].  °• Tell the ER staff that you are a new post-op gastric bypass or gastric sleeve patient °  °Signs and symptoms to report: • Severe vomiting or nausea °o If you cannot keep down clear liquids for longer than 1 day, call your surgeon  °• Abdominal pain that does not get better after taking your pain medication °• Fever over 100.4° F with chills °• Heart beating over 100 beats a minute °• Shortness of breath at rest °• Chest pain °•  Redness, swelling, drainage, or foul odor at incision (surgical) sites °•  If your incisions open or pull apart °• Swelling or pain in calf (lower leg) °• Diarrhea (Loose bowel movements that happen often), frequent watery, uncontrolled bowel movements °• Constipation, (no bowel movements for 3 days) if this happens: Pick one °o Milk of Magnesia, 2 tablespoons by mouth, 3 times a day for 2 days if needed °o Stop taking Milk of Magnesia once you have a bowel movement °o Call your doctor if constipation continues °Or °o Miralax  (instead of Milk of Magnesia) following the label instructions °o Stop taking Miralax once you have a bowel movement °o Call your doctor if constipation continues °• Anything you think is not normal °  °Normal side effects after surgery: • Unable to sleep at night or unable to focus °• Irritability or moody °• Being tearful (crying) or depressed °These are common complaints, possibly related to your anesthesia medications that put you to sleep, stress of surgery, and change in lifestyle.  This usually goes away a few weeks after surgery.  If these feelings continue, call your primary care doctor. °  °Wound Care: You may have surgical glue, steri-strips, or staples over your incisions after  surgery °• Surgical glue:  Looks like a clear film over your incisions and will wear off a little at a time °• Steri-strips: Strips of tape over your incisions. You may notice a yellowish color on the skin under the steri-strips. This is used to make the   steri-strips stick better. Do not pull the steri-strips off - let them fall off °• Staples: Staples may be removed before you leave the hospital °o If you go home with staples, call [facility], [phone] at for an appointment with your surgeon’s nurse to have staples removed 10 days after surgery. °• Showering: You may shower two (2) days after your surgery unless your surgeon tells you differently °o Wash gently around incisions with warm soapy water, rinse well, and gently pat dry  °o No tub baths until staples are removed, steri-strips fall off or glue is gone.  °  °Medications: • Medications should be liquid or crushed if larger than the size of a dime °• Extended release pills (medication that release a little bit at a time through the day) should NOT be crushed or cut. (examples include XL, ER, DR, SR) °• Depending on the size and number of medications you take, you may need to space (take a few throughout the day)/change the time you take your medications so that you do not over-fill your pouch (smaller stomach) °• Make sure you follow-up with your primary care doctor to   make medication changes needed during rapid weight loss and life-style changes °• If you have diabetes, follow up with the doctor that orders your diabetes medication(s) within one week after surgery and check your blood sugar regularly. °• Do not drive while taking prescription pain medication  °• It is ok to take Tylenol by the bottle instructions with your pain medicine or instead of your pain medicine as needed.  DO NOT TAKE NSAIDS (EXAMPLES OF NSAIDS:  IBUPROFREN/ NAPROXEN)  °Diet:                    First 2 Weeks ° You will see the dietician t about two (2) weeks  after your surgery. The dietician will increase the types of foods you can eat if you are handling liquids well: °• If you have severe vomiting or nausea and cannot keep down clear liquids lasting longer than 1 day, call your surgeon @ ([phone]) °Protein Shake °• Drink at least 2 ounces of shake 5-6 times per day °• Each serving of protein shakes (usually 8 - 12 ounces) should have: °o 15 grams of protein  °o And no more than 5 grams of carbohydrate  °• Goal for protein each day: °o Men = 80 grams per day °o Women = 60 grams per day °• Protein powder may be added to fluids such as non-fat milk or Lactaid milk or unsweetened Soy/Almond milk (limit to 35 grams added protein powder per serving) ° °Hydration °• Slowly increase the amount of water and other clear liquids as tolerated (See Acceptable Fluids) °• Slowly increase the amount of protein shake as tolerated  °•  Sip fluids slowly and throughout the day.  Do not use straws. °• May use sugar substitutes in small amounts (no more than 6 - 8 packets per day; i.e. Splenda) ° °Fluid Goal °• The first goal is to drink at least 8 ounces of protein shake/drink per day (or as directed by the nutritionist); some examples of protein shakes are Syntrax Nectar, Adkins Advantage, EAS Edge HP, and Unjury. See handout from pre-op Bariatric Education Class: °o Slowly increase the amount of protein shake you drink as tolerated °o You may find it easier to slowly sip shakes throughout the day °o It is important to get your proteins in first °• Your fluid goal is to drink 64 - 100 ounces of fluid daily °o It may take a few weeks to build up to this °• 32 oz (or more) should be clear liquids  °And  °• 32 oz (or more) should be full liquids (see below for examples) °• Liquids should not contain sugar, caffeine, or carbonation ° °Clear Liquids: °• Water or Sugar-free flavored water (i.e. Fruit H2O, Propel) °• Decaffeinated coffee or tea (sugar-free) °• Crystal Lite, Wyler’s Lite,  Minute Maid Lite °• Sugar-free Jell-O °• Bouillon or broth °• Sugar-free Popsicle:   *Less than 20 calories each; Limit 1 per day ° °Full Liquids: °Protein Shakes/Drinks + 2 choices per day of other full liquids °• Full liquids must be: °o No More Than 15 grams of Carbs per serving  °o No More Than 3 grams of Fat per serving °• Strained low-fat cream soup (except Cream of Potato or Tomato) °• Non-Fat milk °• Fat-free Lactaid Milk °• Unsweetened Soy Or Unsweetened Almond Milk °• Low Sugar yogurt (Dannon Lite & Fit, Greek yogurt; Oikos Triple Zero; Chobani Simply 100; Yoplait 100 calorie Greek - No Fruit on the Bottom) ° °  °Vitamins   and Minerals • Start 1 day after surgery unless otherwise directed by your surgeon °• 2 Chewable Bariatric Specific Multivitamin / Multimineral Supplement with iron (Example: Bariatric Advantage Multi EA) °• Chewable Calcium with Vitamin D-3 °(Example: 3 Chewable Calcium Plus 600 with Vitamin D-3) °o Take 500 mg three (3) times a day for a total of 1500 mg each day °o Do not take all 3 doses of calcium at one time as it may cause constipation, and you can only absorb 500 mg  at a time  °o Do not mix multivitamins containing iron with calcium supplements; take 2 hours apart °• Menstruating women and those with a history of anemia (a blood disease that causes weakness) may need extra iron °o Talk with your doctor to see if you need more iron °• Do not stop taking or change any vitamins or minerals until you talk to your dietitian or surgeon °• Your Dietitian and/or surgeon must approve all vitamin and mineral supplements °  °Activity and Exercise: Limit your physical activity as instructed by your doctor.  It is important to continue walking at home.  During this time, use these guidelines: °• Do not lift anything greater than ten (10) pounds for at least two (2) weeks °• Do not go back to work or drive until your surgeon says you can °• You may have sex when you feel comfortable  °o It is  VERY important for female patients to use a reliable birth control method; fertility often increases after surgery  °o All hormonal birth control will be ineffective for 30 days after surgery due to medications given during surgery a barrier method must be used. °o Do not get pregnant for at least 18 months °• Start exercising as soon as your doctor tells you that you can °o Make sure your doctor approves any physical activity °• Start with a simple walking program °• Walk 5-15 minutes each day, 7 days per week.  °• Slowly increase until you are walking 30-45 minutes per day °Consider joining our BELT program. [phone] or email [email or web address] °  °Special Instructions Things to remember: °• Use your CPAP when sleeping if this applies to you ° °• [facility] has two free Bariatric Surgery Support Groups that meet monthly °o The 3rd Thursday of each month, 6 pm, [facility]  °o The 2nd Friday of each month, 11:45 am in the private dining room in the basement of [facility] °• It is very important to keep all follow up appointments with your surgeon, dietitian, primary care physician, and behavioral health practitioner °• Routine follow up schedule with your surgeon include appointments at 2-3 weeks, 6-8 weeks, 6 months, and 1 year at a minimum.  Your surgeon may request to see you more often.   °o After the first year, please follow up with your bariatric surgeon and dietitian at least once a year in order to maintain best weight loss results °[facility]: [phone] °[facility]: [phone] °Bariatric Nurse Coordinator: [phone] °  °   Reviewed and Endorsed  °by [facility] Patient Education Committee, June, 2016 °Edits Approved: Aug, 2018 ° ° ° °

## 2019-12-05 NOTE — Progress Notes (Signed)
S: Had a good night. Really not having any pain, notes tightness and discomfort in epigastrium when full. One emesis after trying to swallow a pill, but denies nausea, dysphagia or reflux. Walking halls and using IS.   O:  Vitals, labs, intake/output, and orders reviewed at this time. Consistently hypertensive which is not a previous diagnosis. No tachycardia. Tmax 100. PO 420, UOP 1900+. CMP unremarkable. WBC 10.1 (8 preop), hgb 13.3 (13.6 preop)  Gen: A&Ox3, no distress  Chest: unlabored respirations, RRR Abd: soft, nontender, nondistended, incisions c/d/i with steri strips no cellulitis or hematoma Ext: warm, no edema Neuro: grossly normal  Lines/tubes/drains: PIV  A/P:  POD 1 sleeve gastrectomy. Doing well.  Continue liquids/ protein Continue ambulation/ pulm toilet Plan discharge today     Romana Juniper, MD [facility], [place] Pager [phone]

## 2019-12-05 NOTE — Discharge Summary (Signed)
Physician Discharge Summary  Amy Bentley VQQ:[id number] DOB: December 28, 1970 DOA: 12/04/2019  PCP: Leighton Ruff, MD  Admit date: 12/04/2019 Discharge date: 12/05/2019  Recommendations for Outpatient Follow-up:   Follow-up Information     Clovis Riley, MD. Go on 12/22/2019.   Specialty: General Surgery Why: @ 2:20pm  with Dr. Denyce Robert information: [place] [address] [phone]         Clovis Riley, MD .   Specialty: General Surgery Contact information: [address] [place] [address] [phone]           Discharge Diagnoses:  Active Problems:   Morbid obesity ([place])   Surgical Procedure: Laparoscopic Sleeve Gastrectomy, upper endoscopy  Discharge Condition: Good Disposition: Home  Diet recommendation: Postoperative sleeve gastrectomy diet (liquids only)  Filed Weights   12/04/19 0530 12/04/19 0543  Weight: (!) 141.5 kg (!) 142.2 kg     Hospital Course:  The patient was admitted for a planned laparoscopic sleeve gastrectomy. Please see operative note. Preoperatively the patient was given 5000 units of subcutaneous heparin for DVT prophylaxis. Postoperative prophylactic Lovenox dosing was started on the evening of postoperative day 0. ERAS protocol was used. On the evening of postoperative day 0, the patient was started on water and ice chips. On postoperative day 1 the patient had no fever or tachycardia and was tolerating water in their diet was gradually advanced throughout the day. Pain was well controlled with tylenol alone. The patient was ambulating without difficulty. Their vital signs are stable without fever or tachycardia. Their hemoglobin had remained stable. The patient had received discharge instructions and counseling. They were deemed stable for discharge and had met discharge criteria  Discharge exam: Vitals:   12/05/19 0944 12/05/19 1346  BP: (!) 159/66 (!)  174/79  Pulse: (!) 46 (!) 43  Resp: 18 18  Temp: 99.2 F (37.3 C) 98.9 F (37.2 C)  SpO2: 99% 100%     Alert and well appearing Unlabored respirations Abdomen soft, nondistended, nontender. Incisions c/d/i with steri strips, no hematoma or cellulitis. No LE edema   Discharge Instructions  Discharge Instructions     Ambulate hourly while awake   Complete by: As directed    Call MD for:  difficulty breathing, headache or visual disturbances   Complete by: As directed    Call MD for:  persistant dizziness or light-headedness   Complete by: As directed    Call MD for:  persistant nausea and vomiting   Complete by: As directed    Call MD for:  redness, tenderness, or signs of infection (pain, swelling, redness, odor or green/yellow discharge around incision site)   Complete by: As directed    Call MD for:  severe uncontrolled pain   Complete by: As directed    Call MD for:  temperature >101 F   Complete by: As directed    Incentive spirometry   Complete by: As directed    Perform hourly while awake      Allergies as of 12/05/2019       Reactions   Bactrim [sulfamethoxazole-trimethoprim] Other (See Comments)   Causes disorientation, inability to speak        Medication List     TAKE these medications    acetaminophen 650 MG CR tablet Commonly known as: TYLENOL Take 1,300 mg by mouth every 8 (eight) hours as needed for pain. What changed: Another medication with the same name was added. Make sure you  understand how and when to take each.   acetaminophen 500 MG tablet Commonly known as: TYLENOL Take 2 tablets (1,000 mg total) by mouth every 8 (eight) hours for 5 days. What changed: You were already taking a medication with the same name, and this prescription was added. Make sure you understand how and when to take each.   diphenhydrAMINE 25 MG tablet Commonly known as: BENADRYL Take 25 mg by mouth daily as needed for allergies.   docusate sodium 100 MG  capsule Commonly known as: COLACE Take 1 capsule (100 mg total) by mouth 2 (two) times daily. OK to decrease to once daily or discontinue if having loose bowel movements   gabapentin 100 MG capsule Commonly known as: NEURONTIN Take 2 capsules (200 mg total) by mouth every 12 (twelve) hours.   ondansetron 4 MG disintegrating tablet Commonly known as: ZOFRAN-ODT Take 1 tablet (4 mg total) by mouth every 6 (six) hours as needed for nausea or vomiting.   pantoprazole 40 MG tablet Commonly known as: PROTONIX Take 1 tablet (40 mg total) by mouth daily.   traMADol 50 MG tablet Commonly known as: ULTRAM Take 1 tablet (50 mg total) by mouth every 6 (six) hours as needed (pain).       Follow-up Information     Clovis Riley, MD. Go on 12/22/2019.   Specialty: General Surgery Why: @ 2:20pm  with Dr. Denyce Robert information: [place] [address] [phone]         Clovis Riley, MD .   Specialty: General Surgery Contact information: [address] [place] [address] [phone]             The results of significant diagnostics from this hospitalization (including imaging, microbiology, ancillary and laboratory) are listed below for reference.    Significant Diagnostic Studies: No results found.  Labs: Basic Metabolic Panel: Recent Labs  Lab 12/05/19 0546  NA 136  K 4.9  CL 103  CO2 21*  GLUCOSE 112*  BUN 11  CREATININE 0.82  CALCIUM 8.8*  MG 2.1   Liver Function Tests: Recent Labs  Lab 12/05/19 0546  AST 53*  ALT 45*  ALKPHOS 94  BILITOT 1.1  PROT 7.9  ALBUMIN 4.1    CBC: Recent Labs  Lab 12/05/19 0546  WBC 10.1  NEUTROABS 8.2*  HGB 13.3  HCT 42.0  MCV 85.0  PLT 243    CBG: No results for input(s): GLUCAP in the last 168 hours.  Active Problems:   Morbid obesity ([place])    Signed:  Clovis Riley, [facility],  [place] 12/05/2019, 4:22 PM

## 2019-12-05 NOTE — Progress Notes (Signed)
Patient completed protein intake with no nausea, no complaints of pain, and has reviewed DC instructions and verbalized understanding. IV site removed and education packet given to patient.

## 2019-12-05 NOTE — Progress Notes (Signed)
Patient alert and oriented, pain is controlled. Patient is tolerating fluids, advanced to protein shake today, patient is tolerating small sips well.  Reviewed Gastric sleeve discharge instructions with patient and patient is able to articulate understanding.  Provided information on BELT program, Support Group and [facility] outpatient pharmacy. All questions answered; pt to discharge soon per order.  24 hr po intake= 620cc

## 2019-12-07 DIAGNOSIS — Z903 Acquired absence of stomach [part of]: Secondary | ICD-10-CM | POA: Diagnosis not present

## 2019-12-07 DIAGNOSIS — R030 Elevated blood-pressure reading, without diagnosis of hypertension: Secondary | ICD-10-CM | POA: Diagnosis not present

## 2019-12-11 ENCOUNTER — Telehealth (HOSPITAL_COMMUNITY): Payer: Self-pay

## 2019-12-11 NOTE — Telephone Encounter (Signed)
Patient called to discuss post bariatric surgery follow up questions.  See below:   1.  Tell me about your pain and pain management?minimal pain  2.  Let's talk about fluid intake.  How much total fluid are you taking in? 60 ounces of water yesterday  3.  How much protein have you taken in the last 2 days?15 grams of protein  4.  Have you had nausea?  Tell me about when have experienced nausea and what you did to help?nausea when used straw took medication and helped  5.  Has the frequency or color changed with your urine?light colored urine and frequent  6.  Tell me what your incisions look like?no problem  7.  Have you been passing gas? BM?had bm yesterday  8.  If a problem or question were to arise who would you call?  Do you know contact numbers for [facility], [facility], and [facility]?ware of how to cotnact all services  9.  How has the walking going?moving around every hour  10.  How are your vitamins and calcium going?  How are you taking them?bari melts twice per day (asked patient to take with them to post op class for dietitian to review), calcium x 3  No further questions, reminded of post op schedule

## 2019-12-19 ENCOUNTER — Encounter: Payer: BC Managed Care – PPO | Attending: Surgery | Admitting: Skilled Nursing Facility1

## 2019-12-19 ENCOUNTER — Other Ambulatory Visit: Payer: Self-pay

## 2019-12-19 DIAGNOSIS — E669 Obesity, unspecified: Secondary | ICD-10-CM | POA: Insufficient documentation

## 2019-12-21 NOTE — Progress Notes (Signed)
2 Week Post-Operative Nutrition Class   Patient was seen on 08/23/18 for Post-Operative Nutrition education at the [facility].    Surgery date: 12/04/2019 Surgery type: Sleeve [facility] weight at [facility]: 321 Weight today: 293.9   Body Composition Scale Declined  Total Body Fat %   Visceral Fat   Fat-Free Mass %    Total Body Water %    Muscle-Mass lbs   Body Fat Displacement          Torso  lbs          Left Leg  lbs          Right Leg  lbs          Left Arm  lbs          Right Arm   lbs      The following the learning objectives were met by the patient during this course:  Identifies Phase 3 (Soft, High Proteins) Dietary Goals and will begin from 2 weeks post-operatively to 2 months post-operatively  Identifies appropriate sources of fluids and proteins   States protein recommendations and appropriate sources post-operatively  Identifies the need for appropriate texture modifications, mastication, and bite sizes when consuming solids  Identifies appropriate multivitamin and calcium sources post-operatively  Describes the need for physical activity post-operatively and will follow MD recommendations  States when to call healthcare provider regarding medication questions or post-operative complications   Handouts given during class include:  Phase 3A: Soft, High Protein Diet Handout   Follow-Up Plan: Patient will follow-up at [facility] in 6 weeks for 2 month post-op nutrition visit for diet advancement per MD.

## 2019-12-22 DIAGNOSIS — E7800 Pure hypercholesterolemia, unspecified: Secondary | ICD-10-CM | POA: Diagnosis not present

## 2019-12-22 DIAGNOSIS — Z862 Personal history of diseases of the blood and blood-forming organs and certain disorders involving the immune mechanism: Secondary | ICD-10-CM | POA: Diagnosis not present

## 2020-01-19 ENCOUNTER — Inpatient Hospital Stay: Payer: BC Managed Care – PPO | Attending: Oncology | Admitting: Oncology

## 2020-01-19 ENCOUNTER — Inpatient Hospital Stay: Payer: BC Managed Care – PPO

## 2020-01-19 ENCOUNTER — Telehealth: Payer: Self-pay | Admitting: Nurse Practitioner

## 2020-01-19 ENCOUNTER — Encounter: Payer: Self-pay | Admitting: *Deleted

## 2020-01-19 NOTE — Telephone Encounter (Signed)
Rescheduled appointments per 7/23 message. Patient is aware of updated appointment date and time.

## 2020-01-19 NOTE — Progress Notes (Signed)
No show for 1 year f/u today. Scheduling message sent to reschedule for 1 month with BS or NP>

## 2020-01-31 ENCOUNTER — Other Ambulatory Visit: Payer: Self-pay

## 2020-01-31 ENCOUNTER — Encounter: Payer: BC Managed Care – PPO | Attending: Surgery | Admitting: Skilled Nursing Facility1

## 2020-01-31 DIAGNOSIS — E669 Obesity, unspecified: Secondary | ICD-10-CM

## 2020-01-31 NOTE — Progress Notes (Signed)
Bariatric Nutrition Follow-Up Visit Medical Nutrition Therapy   NUTRITION ASSESSMENT    Anthropometrics  Surgery date: 12/04/2019 Surgery type: Sleeve Start weight at [facility]: 321 Weight today: 282.1  Body Composition Scale 01/31/2020  Weight  lbs 282.1  Total Body Fat  % 49.9     Visceral Fat 22  Fat-Free Mass  % 50     Total Body Water  % 39.5     Muscle-Mass  lbs 29.4  BMI 53.5  Body Fat Displacement ---        Torso  lbs 87.3        Left Leg  lbs 17.4        Right Leg  lbs 17.4        Left Arm  lbs 8.7        Right Arm  lbs 8.7   Clinical  Medical hx: cancer-colon, diverticulosis Medications:  Labs:    Lifestyle & Dietary Hx  Pt states she starts BELT soon. Pt states she wakes at 4am. Pt states eggs do not sit well. Pt states she really wants to add in fruit: dietitian educated pt on this slowing her weight loss pt states she does not care about that.  Pt states she has some constipation sometimes.   Estimated daily fluid intake: 70 oz Estimated daily protein intake: 60 g Supplements: inapp multi, zinc, biotin, calcium  Current average weekly physical activity:   24-Hr Dietary Recall First Meal: protein shake + beans Snack:  Second Meal: chicken + beans or lentils Snack: peporin and cheese Third Meal: beans Snack:  Beverages: water, water + flavoring  Post-Op Goals/ Signs/ Symptoms Using straws: no Drinking while eating: no Chewing/swallowing difficulties: no Changes in vision: no Changes to mood/headaches: no Hair loss/changes to skin/nails: no Difficulty focusing/concentrating: no Sweating: no Dizziness/lightheadedness: no Palpitations: no  Carbonated/caffeinated beverages: no N/V/D/C/Gas: no Abdominal pain: no Dumping syndrome: no    NUTRITION DIAGNOSIS  Overweight/obesity ([place]-3.3) related to past poor dietary habits and physical inactivity as evidenced by completed bariatric surgery and following dietary guidelines for continued weight loss  and healthy nutrition status.     NUTRITION INTERVENTION Nutrition counseling (C-1) and education (E-2) to facilitate bariatric surgery goals, including: . Diet advancement to the next phase now including non starchy vegetables + fruit . The importance of consuming adequate calories as well as certain nutrients daily due to the body's need for essential vitamins, minerals, and fats . The importance of daily physical activity and to reach a goal of at least 150 minutes of moderate to vigorous physical activity weekly (or as directed by their physician) due to benefits such as increased musculature and improved lab values . The importance of intuitive eating specifically learning hunger-satiety cues and understanding the importance of learning a new body  Goals: -Follow fruit servings sizes: 1/2 cup or half banana -Continue to aim for a minimum of 64 fluid ounces 7 days a week with at least 30 ounces being plain water -Eat non-starchy vegetables 2 times a day 7 days a week -Start out with soft cooked vegetables today and tomorrow; if tolerated begin to eat raw vegetables or cooked including salads -Eat your 3 ounces of protein first then start in on your non-starchy vegetables; once you understand how much of your meal leads to satisfaction and not full while still eating 3 ounces of protein and non-starchy vegetables you can eat them in any order  -Continue to aim for 30 minutes of activity at least 5 times  a week -Do NOT cook with/add to your food: alfredo sauce, cheese sauce, barbeque sauce, ketchup, fat back, butter, bacon grease, grease, Crisco, OR SUGAR   Handouts Provided Include   Phase 4  Learning Style & Readiness for Change Teaching method utilized: Visual & Auditory  Demonstrated degree of understanding via: Teach Back  Barriers to learning/adherence to lifestyle change: none identified   RD's Notes for Next Visit . Assess adherence to pt chosen goals   MONITORING &  EVALUATION Dietary intake, weekly physical activity, body weight  Next Steps Patient is to follow-up in 3 months

## 2020-02-19 ENCOUNTER — Inpatient Hospital Stay: Payer: BC Managed Care – PPO | Admitting: Nurse Practitioner

## 2020-02-19 ENCOUNTER — Inpatient Hospital Stay: Payer: BC Managed Care – PPO | Attending: Oncology

## 2020-02-19 ENCOUNTER — Encounter: Payer: Self-pay | Admitting: *Deleted

## 2020-02-19 DIAGNOSIS — Z20828 Contact with and (suspected) exposure to other viral communicable diseases: Secondary | ICD-10-CM | POA: Diagnosis not present

## 2020-02-19 NOTE — Progress Notes (Signed)
No show for L/OV today (1 year f/u). Scheduling message sent to reschedule for 1-2 months.

## 2020-02-20 ENCOUNTER — Telehealth: Payer: Self-pay | Admitting: Oncology

## 2020-02-20 NOTE — Telephone Encounter (Signed)
R/s appt per 8/23 sch msg - pt is aware of apt date and time

## 2020-03-07 ENCOUNTER — Other Ambulatory Visit: Payer: Self-pay | Admitting: *Deleted

## 2020-03-07 DIAGNOSIS — C187 Malignant neoplasm of sigmoid colon: Secondary | ICD-10-CM

## 2020-03-08 ENCOUNTER — Inpatient Hospital Stay: Payer: BC Managed Care – PPO | Attending: Oncology

## 2020-03-08 ENCOUNTER — Other Ambulatory Visit: Payer: Self-pay

## 2020-03-08 ENCOUNTER — Inpatient Hospital Stay (HOSPITAL_BASED_OUTPATIENT_CLINIC_OR_DEPARTMENT_OTHER): Payer: BC Managed Care – PPO | Admitting: Nurse Practitioner

## 2020-03-08 ENCOUNTER — Encounter: Payer: Self-pay | Admitting: Nurse Practitioner

## 2020-03-08 VITALS — BP 111/68 | HR 94 | Temp 97.8°F | Resp 18 | Ht 61.0 in | Wt 272.7 lb

## 2020-03-08 DIAGNOSIS — N6310 Unspecified lump in the right breast, unspecified quadrant: Secondary | ICD-10-CM | POA: Diagnosis not present

## 2020-03-08 DIAGNOSIS — D649 Anemia, unspecified: Secondary | ICD-10-CM | POA: Insufficient documentation

## 2020-03-08 DIAGNOSIS — Z79899 Other long term (current) drug therapy: Secondary | ICD-10-CM | POA: Insufficient documentation

## 2020-03-08 DIAGNOSIS — L298 Other pruritus: Secondary | ICD-10-CM | POA: Insufficient documentation

## 2020-03-08 DIAGNOSIS — G620 Drug-induced polyneuropathy: Secondary | ICD-10-CM | POA: Diagnosis not present

## 2020-03-08 DIAGNOSIS — C187 Malignant neoplasm of sigmoid colon: Secondary | ICD-10-CM | POA: Diagnosis not present

## 2020-03-08 LAB — CEA (IN HOUSE-CHCC): CEA (CHCC-In House): 1.56 ng/mL (ref 0.00–5.00)

## 2020-03-08 NOTE — Progress Notes (Addendum)
[place] OFFICE PROGRESS NOTE   Diagnosis: Colon cancer  INTERVAL HISTORY:   Amy Bentley returns for follow-up. She underwent laparoscopic sleeve gastrectomy by Dr. Kae Heller on 12/04/2019.  She reports losing about 40 pounds since the procedure.  She feels well.  She notes significant improvement in her energy level.  Bowels moving regularly overall.  She takes a stool softener as needed.  No rectal bleeding.  No abdominal pain.  Objective:  Vital signs in last 24 hours:  Blood pressure 111/68, pulse 94, temperature 97.8 F (36.6 C), temperature source Axillary, resp. rate 18, height _0  (1.549 m), weight 272 lb 11.2 oz (123.7 kg), SpO2 100 %.    HEENT: Neck without mass. Lymphatics: No palpable cervical, supraclavicular, axillary or inguinal lymph nodes. Resp: Lungs clear bilaterally. Cardio: Regular rate and rhythm. GI: Abdomen soft and nontender.  No hepatomegaly. Vascular: No leg edema.  Lab Results:  Lab Results  Component Value Date   WBC 10.1 12/05/2019   HGB 13.3 12/05/2019   HCT 42.0 12/05/2019   MCV 85.0 12/05/2019   PLT 243 12/05/2019   NEUTROABS 8.2 (H) 12/05/2019    Imaging:  No results found.  Medications: I have reviewed the patient's current medications.  Assessment/Plan: 1. Stage IIC (T4b, N0), adenocarcinoma the sigmoid colon, status post a partial colectomy and en bloc bladder resection 10/20/2013, negative surgical margins, no preoperative colonoscopy. No loss of mismatch repair protein expression. Microsatellite stable.  Mildly elevated preoperative CEA.   CEA normal (1.0) on 11/09/2013.   Cycle 1 adjuvant FOLFOX 11/23/2013.   Cycle 2 adjuvant FOLFOX 12/07/2013.   Cycle 3 adjuvant FOLFOX 12/21/2013 (oxaliplatin dose reduced to 65 mg per meter squared due to neuropathy symptoms).   Cycle 4 adjuvant FOLFOX 01/04/2014.   Cycle 5 adjuvant FOLFOX 01/18/2014.   Cycle 6 adjuvant FOLFOX 02/01/2014-oxaliplatin  discontinued prematurely secondary to an apparent hypersensitivity reaction.   Cycle 7 of adjuvant FOLFOX 02/15/2014.   Cycle 8 adjuvant FOLFOX 03/01/2014.   Cycle 9 adjuvant FOLFOX 03/15/2014. Oxaliplatin was held due to increased neuropathy symptoms  Cycle 10 FOLFOX 03/29/2014  Cycle 11 FOLFOX 04/12/2014-oxaliplatin discontinued  Cycle 12 FOLFOX 04/26/2014-oxaliplatin discontinued  Negative surveillance colonoscopy 08/16/2014  CTs of the chest, abdomen, and pelvis 10/01/2014-negative for metastatic disease  CTs of the chest, abdomen, and pelvis 10/11/2015-negative for recurrent disease  CTs of the chest, abdomen, and pelvis 10/21/2016-negative for recurrent disease  Negative surveillance colonoscopy 12/23/2017 2. history of microcytic anemia-likely iron deficiency anemia. She continues oral iron. 3. Right breast nodule on the chest CT 11/14/2013.  Diagnostic mammogram and ultrasound 12/26/2013 showed a probable fibroadenoma within the inner right breast.   She discussed management options with the radiologist including excision, biopsy and short interval followup. She elected short interval followup. A followup ultrasound is recommended at 6, 12 and 24 months to assess stability.  11/08/2014 Needle core biopsy right breast 3:00 showed fibroadenoma. No atypia or malignancy identified.  CT 10/21/2016-medial right breast mass is larger  Mammogram 12/25/2016 no findings suspicious for malignancy. 4. Port-A-Cath placement 11/15/2013. 5. Delayed nausea following cycle 1 FOLFOX. Aloxi and Emend added with cycle 2 with improvement. 6. 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 Oxaliplatin was dose reduced to 65 mg per meter squared beginning with cycle 3. Increased neuropathy symptoms with persistent numbness/tingling in both feet following cycle 8 FOLFOX. Oxaliplatin held with cycle 9. Neuropathy  symptoms improved 03/29/2014. Oxaliplatin resumed with cycle  10. Oxaliplatin discontinued with cycles 11 and 12 FOLFOX secondary to persistent neuropathy symptoms. 7. Ocular pruritus. Most likely related to 5-fluorouracil. She did not experience ocular pruritus following cycle 5 FOLFOX.  8. Hypersensitivity reaction to oxaliplatin with cycle 6 FOLFOX-she was premedicated with Decadron and given additional steroid/antihistamine premedication beginning with cycle 7 FOLFOX with no reaction. 9. Hospitalization 07/12/2017 through 07/26/2017 with a small bowel obstruction status post exploratory laparotomy and lysis of adhesions with repair of ventral hernia with mesh.   Disposition: Amy Bentley remains in clinical remission from colon cancer.  We will follow-up on the CEA from today.  She is now greater than 6 years out from diagnosis.  She is up-to-date on colonoscopy surveillance.  She would like to continue annual follow-up at the [facility].  We will see her in 1 year with a CEA.  Patient seen with Dr. Benay Spice.    Ned Card ANP/GNP-BC   03/08/2020  11:19 AM  This was a shared visit with Ned Card.  Amy Bentley is in remission from colon cancer.  We discussed the surveillance plan with her.  Julieanne Manson, MD

## 2020-04-09 ENCOUNTER — Other Ambulatory Visit: Payer: Self-pay

## 2020-04-09 ENCOUNTER — Encounter: Payer: BC Managed Care – PPO | Attending: Surgery | Admitting: Skilled Nursing Facility1

## 2020-04-09 DIAGNOSIS — E669 Obesity, unspecified: Secondary | ICD-10-CM | POA: Insufficient documentation

## 2020-04-11 NOTE — Progress Notes (Signed)
Follow-up visit:  Post-Operative sleeve Surgery  Medical Nutrition Therapy:  Appt start time: 6:00pm end time:  7:00pm  Primary concerns today: Post-operative Bariatric Surgery Nutrition Management 6 Month Post-Op Class  Sx date: 12/04/2019   Body Composition Scale 04/09/2020  Weight  lbs 264.3  Total Body Fat  % 48.6     Visceral Fat 20  Fat-Free Mass  % 51.3     Total Body Water  % 40.1     Muscle-Mass  lbs 29.3  BMI 50.1  Body Fat Displacement ---        Torso  lbs 79.7        Left Leg  lbs 15.9        Right Leg  lbs 15.9        Left Arm  lbs 7.9        Right Arm  lbs 7.9     Information Reviewed/ Discussed During Appointment: -Review of composition scale numbers -Fluid requirements (64-100 ounces) -Protein requirements (60-80g) -Strategies for tolerating diet -Advancement of diet to include Starchy vegetables -Barriers to inclusion of new foods -Inclusion of appropriate multivitamin and calcium supplements  -Exercise recommendations   Fluid intake: adequate   Medications: See List Supplementation: appropriate   Using straws: no Drinking while eating: no Having you been chewing well: yes Chewing/swallowing difficulties: no Changes in vision: no Changes to mood/headaches: no Hair loss/Cahnges to skin/Changes to nails: no Any difficulty focusing or concentrating: no Sweating: no Dizziness/Lightheaded: no Palpitations: no  Carbonated beverages: no N/V/D/C/GAS: no Abdominal Pain: no Dumping syndrome: no  Recent physical activity:  ADL's  Progress Towards Goal(s):  In Progress  Handouts given during visit include:  Phase V diet Progression   Goals Sheet  The Benefits of Exercise are endless.....  Support Group Topics  Teaching Method Utilized:  Visual Auditory Hands on  Demonstrated degree of understanding via:  Teach Back   Monitoring/Evaluation:  Dietary intake, exercise, and body weight. Follow up in 3 months for 9 month post-op  visit.

## 2020-07-08 ENCOUNTER — Ambulatory Visit: Payer: BC Managed Care – PPO | Admitting: Skilled Nursing Facility1

## 2020-09-19 ENCOUNTER — Encounter: Payer: BC Managed Care – PPO | Admitting: Skilled Nursing Facility1

## 2020-12-17 ENCOUNTER — Encounter: Payer: BC Managed Care – PPO | Attending: Surgery | Admitting: Skilled Nursing Facility1

## 2021-03-10 ENCOUNTER — Inpatient Hospital Stay: Payer: BC Managed Care – PPO | Attending: Oncology | Admitting: Oncology

## 2021-03-10 ENCOUNTER — Other Ambulatory Visit: Payer: BC Managed Care – PPO

## 2021-03-10 ENCOUNTER — Inpatient Hospital Stay: Payer: BC Managed Care – PPO

## 2021-03-10 NOTE — Progress Notes (Deleted)
[facility] OFFICE PROGRESS NOTE   Diagnosis: Colon cancer  INTERVAL HISTORY:   ***  Objective:  Vital signs in last 24 hours:  There were no vitals taken for this visit.    HEENT: *** Lymphatics: *** Resp: *** Cardio: *** GI: *** Vascular: *** Neuro:***  Skin:***   Portacath/PICC-without erythema  Lab Results:  Lab Results  Component Value Date   WBC 10.1 12/05/2019   HGB 13.3 12/05/2019   HCT 42.0 12/05/2019   MCV 85.0 12/05/2019   PLT 243 12/05/2019   NEUTROABS 8.2 (H) 12/05/2019    CMP  Lab Results  Component Value Date   NA 136 12/05/2019   K 4.9 12/05/2019   CL 103 12/05/2019   CO2 21 (L) 12/05/2019   GLUCOSE 112 (H) 12/05/2019   BUN 11 12/05/2019   CREATININE 0.82 12/05/2019   CALCIUM 8.8 (L) 12/05/2019   PROT 7.9 12/05/2019   ALBUMIN 4.1 12/05/2019   AST 53 (H) 12/05/2019   ALT 45 (H) 12/05/2019   ALKPHOS 94 12/05/2019   BILITOT 1.1 12/05/2019   GFRNONAA >60 12/05/2019   GFRAA >60 12/05/2019    Lab Results  Component Value Date   CEA1 1.56 03/08/2020   CEA 0.7 10/08/2015   CA125 44.0 (H) 10/18/2013      Medications: I have reviewed the patient's current medications.   Assessment/Plan: Stage IIC (T4b, N0), adenocarcinoma the sigmoid colon, status post a partial colectomy and en bloc bladder resection 10/20/2013, negative surgical margins, no preoperative colonoscopy. No loss of mismatch repair protein expression. Microsatellite stable. Mildly elevated preoperative CEA.   CEA normal (1.0) on 11/09/2013.   Cycle 1 adjuvant FOLFOX 11/23/2013.   Cycle 2 adjuvant FOLFOX 12/07/2013.   Cycle 3 adjuvant FOLFOX 12/21/2013 (oxaliplatin dose reduced to 65 mg per meter squared due to neuropathy symptoms).   Cycle 4 adjuvant FOLFOX 01/04/2014.   Cycle 5 adjuvant FOLFOX 01/18/2014.   Cycle 6 adjuvant FOLFOX 02/01/2014-oxaliplatin discontinued prematurely secondary to an apparent hypersensitivity reaction.   Cycle 7 of  adjuvant FOLFOX 02/15/2014.   Cycle 8 adjuvant FOLFOX 03/01/2014.   Cycle 9 adjuvant FOLFOX 03/15/2014. Oxaliplatin was held due to increased neuropathy symptoms Cycle 10 FOLFOX 03/29/2014 Cycle 11 FOLFOX 04/12/2014-oxaliplatin discontinued Cycle 12 FOLFOX 04/26/2014-oxaliplatin discontinued Negative surveillance colonoscopy 08/16/2014 CTs of the chest, abdomen, and pelvis 10/01/2014-negative for metastatic disease  CTs of the chest, abdomen, and pelvis 10/11/2015-negative for recurrent disease CTs of the chest, abdomen, and pelvis 10/21/2016-negative for recurrent disease Negative surveillance colonoscopy 12/23/2017  history of microcytic anemia-likely iron deficiency anemia. She continues oral iron. Right breast nodule on the chest CT 11/14/2013. Diagnostic mammogram and ultrasound 12/26/2013 showed a probable fibroadenoma within the inner right breast.   She discussed management options with the radiologist including excision, biopsy and short interval followup. She elected short interval followup. A followup ultrasound is recommended at 6, 12 and 24 months to assess stability. 11/08/2014 Needle core biopsy right breast 3:00 showed fibroadenoma. No atypia or malignancy identified. CT 10/21/2016-medial right breast mass is larger Mammogram 12/25/2016 no findings suspicious for malignancy. Port-A-Cath placement 11/15/2013. Delayed nausea following cycle 1 FOLFOX. Aloxi and Emend added with cycle 2 with improvement. 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 Oxaliplatin was dose reduced to 65 mg per meter squared beginning with cycle 3. Increased neuropathy symptoms with persistent numbness/tingling in both feet following cycle 8 FOLFOX. Oxaliplatin held with cycle 9. Neuropathy symptoms improved 03/29/2014. Oxaliplatin resumed  with cycle 10. Oxaliplatin discontinued with cycles 11 and 12 FOLFOX secondary to persistent  neuropathy symptoms. Ocular pruritus. Most likely related to 5-fluorouracil. She did not experience ocular pruritus following cycle 5 FOLFOX.   Hypersensitivity reaction to oxaliplatin with cycle 6 FOLFOX-she was premedicated with Decadron and given additional steroid/antihistamine premedication beginning with cycle 7 FOLFOX with no reaction. Hospitalization 07/12/2017 through 07/26/2017 with a small bowel obstruction status post exploratory laparotomy and lysis of adhesions with repair of ventral hernia with mesh.      Disposition: ***  Betsy Coder, MD  03/10/2021  8:24 AM

## 2021-03-27 ENCOUNTER — Telehealth: Payer: Self-pay | Admitting: *Deleted

## 2021-03-27 NOTE — Telephone Encounter (Signed)
Missed 1 year f/u appointment on 03/10/21. Unable to reach to inquire about re-schedule. She is 7 years out from colon cancer. Will sent Mychart message.

## 2021-06-27 ENCOUNTER — Encounter (HOSPITAL_COMMUNITY): Payer: Self-pay | Admitting: *Deleted

## 2021-12-14 ENCOUNTER — Ambulatory Visit
Admission: RE | Admit: 2021-12-14 | Discharge: 2021-12-14 | Disposition: A | Discharge status: Home / Self Care | Payer: Managed Care, Other (non HMO) | Source: Ambulatory Visit | Attending: Internal Medicine | Admitting: Internal Medicine

## 2021-12-14 VITALS — BP 133/85 | HR 55 | Temp 98.2°F

## 2021-12-14 DIAGNOSIS — Z9109 Other allergy status, other than to drugs and biological substances: Secondary | ICD-10-CM

## 2021-12-14 DIAGNOSIS — R202 Paresthesia of skin: Secondary | ICD-10-CM

## 2021-12-14 MED ORDER — PREDNISONE 20 MG PO TABS: 40.0000 mg | ORAL_TABLET | Freq: Every day | ORAL | 0 refills | Status: AC

## 2021-12-14 NOTE — Discharge Instructions (Signed)
It is possible that you may be having a reaction to this cosmetic product.  Although, it does not exactly correlate with an allergic reaction.  I am going to prescribe prednisone to help decrease reaction.  If it is not getting better you need close follow-up.  Please also call primary care doctor tomorrow to schedule appointment for further evaluation and management.

## 2021-12-14 NOTE — ED Provider Notes (Signed)
[facility]    CSN: [id number] Arrival date & time: 12/14/21  1048      History   Chief Complaint Chief Complaint  Patient presents with   Allergic Reaction    I used a skin tightening cream that has my hands feet and legs numb for almost 2 weeks. I'ts impacting how I walk and I can barely use my hands - Entered by patient    HPI Amy Bentley is a 51 y.o. female.   Patient presents with concerns of reaction to recent cosmetic product that she used for skin tightening.  Patient reports that she applied "body bank balm" to upper and lower extremities about 2 weeks ago.  She noticed that she started having some numbness and tingling of bilateral upper and lower extremities as well as some "skin tightness".  Symptoms started directly after application.  Denies any associated itchiness or rash.  Patient reports that she has noticed some weakness in her legs as well which has caused some difficulty walking.  Denies any associated headache, dizziness, blurred vision, chest pain, shortness of breath.  Denies any other changes in the environment.  Patient does report that she has a history of neuropathy but states that this "feels different".   Allergic Reaction   Past Medical History:  Diagnosis Date   colon ca dx'd 09/2013   colon   GERD (gastroesophageal reflux disease)    Hyperlipidemia    Morbid obesity ([place])     Patient Active Problem List   Diagnosis Date Noted   Morbid obesity ([place])    History of colon cancer    S/P laparoscopic procedure 07/21/2017   Incisional hernia, reducible 07/16/2017   Nausea and vomiting 07/12/2017   Malignant neoplasm of sigmoid colon ([place]) 03/13/2014   Family history of malignant neoplasm of gastrointestinal tract 03/13/2014   Cancer of sigmoid colon, invading bladder, T4N0, s/p en bloc resection 10/20/2013 11/06/2013   Protein-calorie malnutrition, severe ([place]) 10/19/2013   Iron deficiency anemia, multifactorial with GI/GU  and menstrual losses contributory 10/18/2013   Diverticulitis of colon (without mention of hemorrhage)(562.11) 10/18/2013   Weight loss 10/17/2013   Altered mental status 08/07/2013   UTI (lower urinary tract infection) 08/07/2013   Hypokalemia 08/07/2013   Anemia 08/07/2013    Past Surgical History:  Procedure Laterality Date   BREAST BIOPSY Right    stereo    CESAREAN SECTION     x 2   COLON RESECTION N/A 10/20/2013   Procedure: OPEN SIGMOID COLONECTOMY COLOVESICAL FISTULA REPAIR;  Surgeon: Stark Klein, MD;  Location: [facility];  Service: General;  Laterality: N/A;   COLONOSCOPY  08/16/2014   COLONOSCOPY WITH PROPOFOL N/A 08/16/2014   Procedure: COLONOSCOPY WITH PROPOFOL;  Surgeon: Milus Banister, MD;  Location: [facility];  Service: Endoscopy;  Laterality: N/A;   COLONOSCOPY WITH PROPOFOL N/A 12/23/2017   Procedure: COLONOSCOPY WITH PROPOFOL;  Surgeon: Milus Banister, MD;  Location: [facility];  Service: Endoscopy;  Laterality: N/A;   CYSTECTOMY N/A 10/20/2013   Procedure: CYSTECTOMY PARTIAL;  Surgeon: Ardis Hughs, MD;  Location: [facility];  Service: Urology;  Laterality: N/A;   HERNIA REPAIR     LAPAROSCOPIC GASTRIC SLEEVE RESECTION N/A 12/04/2019   Procedure: LAPAROSCOPIC GASTRIC SLEEVE RESECTION, Upper Endo, Eras Pathway;  Surgeon: Clovis Riley, MD;  Location: [facility];  Service: General;  Laterality: N/A;   PORTACATH PLACEMENT N/A 11/15/2013   Procedure: INSERTION PORT-A-CATH;  Surgeon: Stark Klein, MD;  Location: [facility];  Service:  General;  Laterality: N/A;   TUBAL LIGATION     UPPER GI ENDOSCOPY N/A 12/04/2019   Procedure: UPPER GI ENDOSCOPY;  Surgeon: Clovis Riley, MD;  Location: [facility];  Service: General;  Laterality: N/A;   VENTRAL HERNIA REPAIR N/A 07/21/2017   Procedure: EXPLORATORY LAPAROTOMY AND LYSIS OF ADHESIONS WITH  REPAIR OF VENTRAL HERNIA WITH MESH ;  Surgeon: Excell Seltzer, MD;  Location: [facility];  Service: General;  Laterality: N/A;    OB  History     Gravida  2   Para  2   Term  2   Preterm      AB      Living  2      SAB      IAB      Ectopic      Multiple      Live Births               Home Medications    Prior to Admission medications   Medication Sig Start Date End Date Taking? Authorizing Provider  predniSONE (DELTASONE) 20 MG tablet Take 2 tablets (40 mg total) by mouth daily for 5 days. 12/14/21 12/19/21 Yes Halana Deisher, Michele Rockers, FNP  acetaminophen (TYLENOL) 650 MG CR tablet Take 1,300 mg by mouth every 8 (eight) hours as needed for pain.    [provider]  diphenhydrAMINE (BENADRYL) 25 MG tablet Take 25 mg by mouth daily as needed for allergies.    [provider]  docusate sodium (COLACE) 100 MG capsule Take 1 capsule (100 mg total) by mouth 2 (two) times daily. OK to decrease to once daily or discontinue if having loose bowel movements 12/05/19   Clovis Riley, MD  gabapentin (NEURONTIN) 100 MG capsule Take 2 capsules (200 mg total) by mouth every 12 (twelve) hours. 12/05/19   Clovis Riley, MD  ondansetron (ZOFRAN-ODT) 4 MG disintegrating tablet Take 1 tablet (4 mg total) by mouth every 6 (six) hours as needed for nausea or vomiting. 12/05/19   Clovis Riley, MD  pantoprazole (PROTONIX) 40 MG tablet Take 1 tablet (40 mg total) by mouth daily. 12/05/19   Clovis Riley, MD  traMADol (ULTRAM) 50 MG tablet Take 1 tablet (50 mg total) by mouth every 6 (six) hours as needed (pain). 12/05/19   Clovis Riley, MD    Family History Family History  Problem Relation Age of Onset   Cancer Paternal Aunt 31       colon   Colon cancer Paternal Aunt 56   Hypertension Other    Diabetes Other    Esophageal cancer Neg Hx    Rectal cancer Neg Hx    Stomach cancer Neg Hx    Breast cancer Neg Hx     Social History Social History   Tobacco Use   Smoking status: Former    Packs/day: 0.50    Types: Cigarettes    Quit date: 10/17/2013    Years since quitting: 8.1   Smokeless  tobacco: Never  Vaping Use   Vaping Use: Never used  Substance Use Topics   Alcohol use: Not Currently   Drug use: Yes    Types: Marijuana    Comment: Daily     Allergies   Bactrim [sulfamethoxazole-trimethoprim]   Review of Systems Review of Systems Per HPI  Physical Exam Triage Vital Signs ED Triage Vitals  Enc Vitals Group     BP 12/14/21 1059 133/85     Pulse Rate 12/14/21  1059 (!) 55     Resp --      Temp 12/14/21 1059 98.2 F (36.8 C)     Temp Source 12/14/21 1059 Oral     SpO2 12/14/21 1059 97 %     Weight --      Height --      Head Circumference --      Peak Flow --      Pain Score 12/14/21 1103 0     Pain Loc --      Pain Edu? --      Excl. in [place]? --    No data found.  Updated Vital Signs BP 133/85 (BP Location: Left Arm)   Pulse (!) 55   Temp 98.2 F (36.8 C) (Oral)   SpO2 97%   Visual Acuity Right Eye Distance:   Left Eye Distance:   Bilateral Distance:    Right Eye Near:   Left Eye Near:    Bilateral Near:     Physical Exam Constitutional:      General: She is not in acute distress.    Appearance: Normal appearance. She is not toxic-appearing or diaphoretic.  HENT:     Head: Normocephalic and atraumatic.  Eyes:     Extraocular Movements: Extraocular movements intact.     Conjunctiva/sclera: Conjunctivae normal.     Pupils: Pupils are equal, round, and reactive to light.  Cardiovascular:     Rate and Rhythm: Normal rate and regular rhythm.     Pulses: Normal pulses.     Heart sounds: Normal heart sounds.  Pulmonary:     Effort: Pulmonary effort is normal. No respiratory distress.     Breath sounds: Normal breath sounds.  Skin:    Comments: No obvious rashes, discoloration, swelling noted to bilateral upper and lower extremities where patient is experiencing sensation.  Patient does have mild tenderness to palpation to fourth and fifth digits of bilateral hands.  Capillary refill and pulses normal throughout.  Neurovascularly  intact.  Neurological:     General: No focal deficit present.     Mental Status: She is alert and oriented to person, place, and time. Mental status is at baseline.     Cranial Nerves: Cranial nerves 2-12 are intact.     Sensory: Sensation is intact.     Motor: Motor function is intact.     Coordination: Coordination is intact.     Gait: Gait is intact.  Psychiatric:        Mood and Affect: Mood normal.        Behavior: Behavior normal.        Thought Content: Thought content normal.        Judgment: Judgment normal.      UC Treatments / Results  Labs (all labs ordered are listed, but only abnormal results are displayed) Labs Reviewed - No data to display  EKG   Radiology No results found.  Procedures Procedures (including critical care time)  Medications Ordered in UC Medications - No data to display  Initial Impression / Assessment and Plan / UC Course  I have reviewed the triage vital signs and the nursing notes.  Pertinent labs & imaging results that were available during my care of the patient were reviewed by me and considered in my medical decision making (see chart for details).     Patient's symptoms started directly after application of this cosmetic product.  Although, this does not exactly correlate with an allergic reaction.  Although, neuro exam  is normal and there is no concern or need for emergent evaluation at the hospital or imaging of the head at this time.  Will treat with prednisone to decrease inflammation and help to see if reaction resolves with this.  Patient was also advised to call PCP tomorrow to schedule appointment for further evaluation and management.  Patient was given strict return and ER precautions.  Patient verbalized understanding and was agreeable with plan. Final Clinical Impressions(s) / UC Diagnoses   Final diagnoses:  Paresthesias  Allergy to cosmetics     Discharge Instructions      It is possible that you may be having  a reaction to this cosmetic product.  Although, it does not exactly correlate with an allergic reaction.  I am going to prescribe prednisone to help decrease reaction.  If it is not getting better you need close follow-up.  Please also call primary care doctor tomorrow to schedule appointment for further evaluation and management.     ED Prescriptions     Medication Sig Dispense Auth. Provider   predniSONE (DELTASONE) 20 MG tablet Take 2 tablets (40 mg total) by mouth daily for 5 days. 10 tablet Teodora Medici, [place]      PDMP not reviewed this encounter.   Teodora Medici,  12/14/21 1155

## 2021-12-14 NOTE — ED Triage Notes (Signed)
Pt c/o loss of full sensation to extremities after using skin tightening treatment. Describes a burning sensation.

## 2021-12-31 ENCOUNTER — Ambulatory Visit
Admission: RE | Admit: 2021-12-31 | Discharge: 2021-12-31 | Disposition: A | Discharge status: Home / Self Care | Payer: Managed Care, Other (non HMO) | Source: Ambulatory Visit | Attending: Physician Assistant | Admitting: Physician Assistant

## 2021-12-31 ENCOUNTER — Other Ambulatory Visit: Payer: Self-pay | Admitting: Physician Assistant

## 2021-12-31 DIAGNOSIS — M79641 Pain in right hand: Secondary | ICD-10-CM

## 2021-12-31 DIAGNOSIS — R202 Paresthesia of skin: Secondary | ICD-10-CM

## 2021-12-31 DIAGNOSIS — M79601 Pain in right arm: Secondary | ICD-10-CM

## 2021-12-31 DIAGNOSIS — R200 Anesthesia of skin: Secondary | ICD-10-CM

## 2022-01-02 ENCOUNTER — Emergency Department (HOSPITAL_COMMUNITY)
Admission: EM | Admit: 2022-01-02 | Discharge: 2022-01-03 | Disposition: A | Discharge status: Home / Self Care | Payer: Managed Care, Other (non HMO) | Attending: Emergency Medicine | Admitting: Emergency Medicine

## 2022-01-02 ENCOUNTER — Encounter (HOSPITAL_COMMUNITY): Payer: Self-pay

## 2022-01-02 DIAGNOSIS — Z85038 Personal history of other malignant neoplasm of large intestine: Secondary | ICD-10-CM | POA: Diagnosis not present

## 2022-01-02 DIAGNOSIS — R200 Anesthesia of skin: Secondary | ICD-10-CM | POA: Diagnosis not present

## 2022-01-02 DIAGNOSIS — G9520 Unspecified cord compression: Secondary | ICD-10-CM

## 2022-01-02 DIAGNOSIS — R202 Paresthesia of skin: Secondary | ICD-10-CM | POA: Diagnosis not present

## 2022-01-02 DIAGNOSIS — R531 Weakness: Secondary | ICD-10-CM | POA: Insufficient documentation

## 2022-01-02 DIAGNOSIS — M545 Low back pain, unspecified: Secondary | ICD-10-CM | POA: Insufficient documentation

## 2022-01-02 DIAGNOSIS — M5020 Other cervical disc displacement, unspecified cervical region: Secondary | ICD-10-CM

## 2022-01-02 LAB — COMPREHENSIVE METABOLIC PANEL
ALT: 16 U/L (ref 0–44)
AST: 20 U/L (ref 15–41)
Albumin: 4.1 g/dL (ref 3.5–5.0)
Alkaline Phosphatase: 88 U/L (ref 38–126)
Anion gap: 7 (ref 5–15)
BUN: 14 mg/dL (ref 6–20)
CO2: 26 mmol/L (ref 22–32)
Calcium: 9.2 mg/dL (ref 8.9–10.3)
Chloride: 104 mmol/L (ref 98–111)
Creatinine, Ser: 0.61 mg/dL (ref 0.44–1.00)
GFR, Estimated: >60 mL/min (ref >60)
Glucose, Bld: 101 mg/dL — ABNORMAL HIGH (ref 70–99)
Potassium: 4 mmol/L (ref 3.5–5.1)
Sodium: 137 mmol/L (ref 135–145)
Total Bilirubin: 0.7 mg/dL (ref 0.3–1.2)
Total Protein: 7.5 g/dL (ref 6.5–8.1)

## 2022-01-02 LAB — CBC WITH DIFFERENTIAL/PLATELET
Abs Immature Granulocytes: 0 10*3/uL (ref 0.00–0.07)
Basophils Absolute: 0 10*3/uL (ref 0.0–0.1)
Basophils Relative: 0 %
Eosinophils Absolute: 0.1 10*3/uL (ref 0.0–0.5)
Eosinophils Relative: 2 %
HCT: 40 % (ref 36.0–46.0)
Hemoglobin: 12.5 g/dL (ref 12.0–15.0)
Immature Granulocytes: 0 %
Lymphocytes Relative: 26 %
Lymphs Abs: 1.3 10*3/uL (ref 0.7–4.0)
MCH: 26.5 pg (ref 26.0–34.0)
MCHC: 31.3 g/dL (ref 30.0–36.0)
MCV: 84.7 fL (ref 80.0–100.0)
Monocytes Absolute: 0.3 10*3/uL (ref 0.1–1.0)
Monocytes Relative: 5 %
Neutro Abs: 3.4 10*3/uL (ref 1.7–7.7)
Neutrophils Relative %: 67 %
Platelets: 225 10*3/uL (ref 150–400)
RBC: 4.72 MIL/uL (ref 3.87–5.11)
RDW: 16.7 % — ABNORMAL HIGH (ref 11.5–15.5)
WBC: 5.1 10*3/uL (ref 4.0–10.5)
nRBC: 0 % (ref 0.0–0.2)

## 2022-01-02 NOTE — ED Provider Notes (Signed)
[facility] DEPT Provider Note   CSN: [id number] Arrival date & time: 01/02/22  1322     History {Add pertinent medical, surgical, social history, OB history to HPI:1} Chief Complaint  Patient presents with   Numbness    Caliann Leckrone is a 51 y.o. female.  HPI      Hx of neuropathy Saw new PCP on Wednesday Minor lower back pain  1 month ago began to have left sided numbness and weakness in left leg, then hands started to tingle, burn, tightness, now moving to right calf with numbness and weakness. \  Past Medical History:  Diagnosis Date   colon ca dx'd 09/2013   colon   GERD (gastroesophageal reflux disease)    Hyperlipidemia    Morbid obesity ([place])      Home Medications Prior to Admission medications   Medication Sig Start Date End Date Taking? Authorizing Provider  acetaminophen (TYLENOL) 650 MG CR tablet Take 1,300 mg by mouth every 8 (eight) hours as needed for pain.    [provider]  diphenhydrAMINE (BENADRYL) 25 MG tablet Take 25 mg by mouth daily as needed for allergies.    [provider]  docusate sodium (COLACE) 100 MG capsule Take 1 capsule (100 mg total) by mouth 2 (two) times daily. OK to decrease to once daily or discontinue if having loose bowel movements 12/05/19   Clovis Riley, MD  gabapentin (NEURONTIN) 100 MG capsule Take 2 capsules (200 mg total) by mouth every 12 (twelve) hours. 12/05/19   Clovis Riley, MD  ondansetron (ZOFRAN-ODT) 4 MG disintegrating tablet Take 1 tablet (4 mg total) by mouth every 6 (six) hours as needed for nausea or vomiting. 12/05/19   Clovis Riley, MD  pantoprazole (PROTONIX) 40 MG tablet Take 1 tablet (40 mg total) by mouth daily. 12/05/19   Clovis Riley, MD  traMADol (ULTRAM) 50 MG tablet Take 1 tablet (50 mg total) by mouth every 6 (six) hours as needed (pain). 12/05/19   Clovis Riley, MD      Allergies    Bactrim [sulfamethoxazole-trimethoprim]     Review of Systems   Review of Systems  Physical Exam Updated Vital Signs BP (!) 162/133   Pulse 60   Temp 97.9 F (36.6 C) (Oral)   Resp 17   SpO2 100%  Physical Exam  ED Results / Procedures / Treatments   Labs (all labs ordered are listed, but only abnormal results are displayed) Labs Reviewed  COMPREHENSIVE METABOLIC PANEL - Abnormal; Notable for the following components:      Result Value   Glucose, Bld 101 (*)    All other components within normal limits  CBC WITH DIFFERENTIAL/PLATELET - Abnormal; Notable for the following components:   RDW 16.7 (*)    All other components within normal limits    EKG EKG Interpretation  Date/Time:  Friday January 02 2022 14:17:10 EDT Ventricular Rate:  67 PR Interval:  148 QRS Duration: 87 QT Interval:  387 QTC Calculation: 409 R Axis:   41 Text Interpretation: Sinus rhythm mild st elevation I and II, No significant change since last tracing of 04Jan 2021 Confirmed by Pattricia Boss [phone]) on 01/02/2022 2:22:42 PM  Radiology No results found.  Procedures Procedures  {Document cardiac monitor, telemetry assessment procedure when appropriate:1}  Medications Ordered in ED Medications - No data to display  ED Course/ Medical Decision Making/ A&P  Medical Decision Making  ***  {Document critical care time when appropriate:1} {Document review of labs and clinical decision tools ie heart score, Chads2Vasc2 etc:1}  {Document your independent review of radiology images, and any outside records:1} {Document your discussion with family members, caretakers, and with consultants:1} {Document social determinants of health affecting pt's care:1} {Document your decision making why or why not admission, treatments were needed:1} Final Clinical Impression(s) / ED Diagnoses Final diagnoses:  None    Rx / DC Orders ED Discharge Orders     None

## 2022-01-02 NOTE — ED Triage Notes (Signed)
Pt states for approximately one month she's had paresthesias to her L side of her body. She states now it is also on her R side and she feels generally weak. Pt states she is being treated for neuropathy secondary to hx of colon cancer. NIHSS 0. VAN neg.

## 2022-01-02 NOTE — ED Provider Triage Note (Signed)
Emergency Medicine Provider Triage Evaluation Note  Amy Bentley , a 51 y.o. female  was evaluated in triage.  Pt complains of numbness.  Patient states that she has a history of neuropathy which usually becomes apparent in her left foot.  She states that this episode began approximately 1 month ago.  Pain is now experiencing bilateral hand with pain and weakness noted in lower extremities.  She is seen by her PCP 3 weeks ago and was placed on gabapentin which has not helped her symptoms.  She notes disturbance in gait over the past 3 to 4 weeks which has resulted in 3 falls.  She had a fall this morning but denies trauma to head or blood thinner use.  She was seen this past Wednesday and had x-rays of bilateral hands/forearms and low back by her primary care provider.  Denies fever, chills, night sweats, chest pain, shortness of breath, abdominal pain, nausea/vomiting/diarrhea, urinary/vaginal symptoms.  Review of Systems  Positive: See above Negative:   Physical Exam  BP (!) 149/102 (BP Location: Right Arm)   Pulse 89   Temp 98.3 F (36.8 C) (Oral)   Resp 16   SpO2 100%  Gen:   Awake, no distress   Resp:  Normal effort  MSK:   Moves extremities without difficulty Other:    Medical Decision Making  Medically screening exam initiated at 2:04 PM.  Appropriate orders placed.  Kady Toothaker was informed that the remainder of the evaluation will be completed by another provider, this initial triage assessment does not replace that evaluation, and the importance of remaining in the ED until their evaluation is complete.     Wilnette Kales, [place] 01/02/22 1407

## 2022-01-03 ENCOUNTER — Emergency Department (HOSPITAL_COMMUNITY): Payer: Managed Care, Other (non HMO)

## 2022-01-03 DIAGNOSIS — R531 Weakness: Secondary | ICD-10-CM | POA: Diagnosis not present

## 2022-01-03 DIAGNOSIS — Z85038 Personal history of other malignant neoplasm of large intestine: Secondary | ICD-10-CM | POA: Diagnosis not present

## 2022-01-03 DIAGNOSIS — M545 Low back pain, unspecified: Secondary | ICD-10-CM | POA: Diagnosis not present

## 2022-01-03 DIAGNOSIS — R200 Anesthesia of skin: Secondary | ICD-10-CM | POA: Diagnosis not present

## 2022-01-03 DIAGNOSIS — R202 Paresthesia of skin: Secondary | ICD-10-CM | POA: Diagnosis present

## 2022-01-03 LAB — VITAMIN B12: Vitamin B-12: 283 pg/mL (ref 180–914)

## 2022-01-03 LAB — RPR: RPR Ser Ql: NONREACTIVE

## 2022-01-03 MED ORDER — IBUPROFEN 800 MG PO TABS: 800.0000 mg | ORAL_TABLET | Freq: Four times a day (QID) | ORAL | 0 refills | Status: DC | PRN

## 2022-01-03 MED ORDER — GADOBUTROL 1 MMOL/ML IV SOLN
10.0000 mL | Freq: Once | INTRAVENOUS | Status: AC | PRN
  Administered 2022-01-03: 10 mL via INTRAVENOUS

## 2022-01-03 MED ORDER — OXYCODONE HCL 5 MG PO TABS: 5.0000 mg | ORAL_TABLET | Freq: Four times a day (QID) | ORAL | 0 refills | Status: DC | PRN

## 2022-01-03 NOTE — ED Notes (Signed)
PT TP BR MINIMAL ASSISTANCE

## 2022-01-03 NOTE — ED Notes (Signed)
Patient verbalizes understanding of discharge instructions. Opportunity for questioning and answers were provided. Armband removed by staff, pt discharged from ED pt ambulated to lobby to return home.

## 2022-01-03 NOTE — ED Notes (Signed)
Pt sent here from [facility] ed for a mri th pt is c/o numbness and tingling of both arms and hands for over 2 months blankets given

## 2022-01-03 NOTE — ED Provider Notes (Signed)
10:02 AM Patient signed out to me by previous ED physician. Pt is a 51 yo female presenting for sensation changes described as numbness/tingling. Denies motor deficits. Symptoms have been present for one month.   Physical Exam  BP (!) 148/99   Pulse 73   Temp 98.5 F (36.9 C) (Oral)   Resp 16   SpO2 100%   Physical Exam Vitals and nursing note reviewed.  Constitutional:      General: She is not in acute distress.    Appearance: Normal appearance.  HENT:     Head: Normocephalic and atraumatic.  Cardiovascular:     Rate and Rhythm: Normal rate and regular rhythm.  Pulmonary:     Effort: Pulmonary effort is normal.     Breath sounds: Normal breath sounds.  Skin:    Capillary Refill: Capillary refill takes less than 2 seconds.  Neurological:     Mental Status: She is alert and oriented to person, place, and time.     GCS: GCS eye subscore is 4. GCS verbal subscore is 5. GCS motor subscore is 6.     Sensory: Sensory deficit present.     Motor: Motor function is intact.     Comments: Subjective sensation changes in pinky, ring finger, and 1/2 middle finger of both hands. Pt describes pressure sensation and cold when touched. Able to feel sharp. No tuning fork to check vibration. Motor function intact.      Procedures  Procedures  ED Course / MDM    Medical Decision Making Amount and/or Complexity of Data Reviewed Labs: ordered. Radiology: ordered.  Risk Prescription drug management.   10:12 AM Patient is calm, no acute distress, resting comfortably in bed.  Physical exam demonstrate subjective sensation changes in pinky, ring finger, and 1/2 middle finger of both hands. Pt describes pressure sensation and cold when touched. Able to feel sharp. No tuning fork to check vibration. Motor function intact.  MRI cervical spine demonstrates C5-C6 disc bulge with cord compression and edema.  Due to patient's symptoms for the past month I think it is reasonable that patient follows  up in the office first thing Monday morning to that she can fully understand the risks and benefits of neurosurgery.  I spoke with on-call neurosurgeon Dr. Reinaldo Meeker who agrees that no emergent neurosurgery is necessary at this time.  Request close follow-up in the office this week.  Given contact information.  Norco given for pain control.  Patient agreeable to plan.  Recommended to return immediately to the emergency department for any worsening symptoms.       Campbell Stall P, DO 40/98/11 1014

## 2022-01-03 NOTE — Discharge Instructions (Signed)
Please follow up with neurosurgeron Dr. Reinaldo Meeker first thing Monday morning. Please call the office if they don't call you.   Your diagnosis is disc bulge at C5-C6 with cord compression and edema.

## 2022-01-03 NOTE — ED Notes (Signed)
Patient picked up by [facility] and transferred to [facility] .

## 2022-01-03 NOTE — ED Notes (Signed)
Progressive numbness and weakness in left leg for the last few weeks.  She has been having trouble ambulating.  Coming from [facility] for MRI.

## 2022-01-03 NOTE — ED Provider Notes (Signed)
Patient was received in transfer from one of our outlying hospitals.  Sent over for MRI of the spine.  Patient complaining of paresthesias to bilateral hands mostly in median nerve distribution.  This been going on for about 3 weeks.  More acutely if she is experienced some left-sided leg numbness.  She tells me that she feels numb from the left L1 area down her leg.  She had basic blood work performed that was unremarkable no significant electrolyte abnormality no leukocytosis no anemia.  She denied loss of bowel or bladder denies loss of peritoneal sensation denies recent trauma or manipulation of the spine.  She does have some left-sided low back pain.  She denies any neck or upper back pain.  On my exam the patient has intact pulse and motor to the left lower extremity.  She tells me that she has no sensation to light touch but does move voluntarily when painful or irritating stimulus is applied.  Reflexes are 2+ and equal.  No clonus.  Awaiting MRI.  Signed out to Dr. Pearline Cables, please see their note for further details of care in the ED.     Deno Etienne, DO 01/03/22 [id number]

## 2022-01-03 NOTE — ED Provider Notes (Incomplete)
7:09 AM Patient signed out to me by previous ED physician. Pt presenting for left sided arm and leg sensation deficits. Pending MRI.

## 2022-01-06 LAB — VITAMIN B1: Vitamin B1 (Thiamine): 82.6 nmol/L (ref 66.5–200.0)

## 2022-01-28 ENCOUNTER — Other Ambulatory Visit: Payer: Self-pay | Admitting: Neurological Surgery

## 2022-01-29 ENCOUNTER — Encounter (HOSPITAL_COMMUNITY): Payer: Self-pay | Admitting: Neurological Surgery

## 2022-01-29 NOTE — Progress Notes (Signed)
PCP - Johna Roles, PA Cardiologist - Jettie Booze, MD EKG - 01/05/22 Chest x-ray -  ECHO -  Cardiac Cath -  CPAP -   ERAS Protcol - clears 1000 COVID TEST- n/a  Anesthesia review: n/a  -------------  SDW INSTRUCTIONS:  Your procedure is scheduled on 8/4. Please report to [facility] Main Entrance "A" at 1030 A.M., and check in at the Admitting office. Call this number if you have problems the morning of surgery: [phone]   Remember: Do not eat after midnight the night before your surgery  You may drink clear liquids until 1000 AM the morning of your surgery.   Clear liquids allowed are: Water, Non-Citrus Juices (without pulp), Carbonated Beverages, Clear Tea, Black Coffee Only, and Gatorade   Medications to take morning of surgery with a sip of water include: gabapentin (NEURONTIN)  oxyCODONE (ROXICODONE)if needed acetaminophen (TYLENOL) if needed  As of today, STOP taking any Aspirin (unless otherwise instructed by your surgeon), Aleve, Naproxen, Ibuprofen, Motrin, Advil, Goody's, BC's, all herbal medications, fish oil, and all vitamins.    The Morning of Surgery Do not wear jewelry, make-up or nail polish. Do not wear lotions, powders, or perfumes, or deodorant Do not bring valuables to the hospital. [facility] is not responsible for any belongings or valuables.  If you are a smoker, DO NOT Smoke 24 hours prior to surgery  If you wear a CPAP at night please bring your mask the morning of surgery   Remember that you must have someone to transport you home after your surgery, and remain with you for 24 hours if you are discharged the same day.  Please bring cases for contacts, glasses, hearing aids, dentures or bridgework because it cannot be worn into surgery.   Patients discharged the day of surgery will not be allowed to drive home.   Please shower the NIGHT BEFORE/MORNING OF SURGERY (use antibacterial soap like DIAL soap if possible). Wear  comfortable clothes the morning of surgery. Oral Hygiene is also important to reduce your risk of infection.  Remember - BRUSH YOUR TEETH THE MORNING OF SURGERY WITH YOUR REGULAR TOOTHPASTE  Patient denies shortness of breath, fever, cough and chest pain.

## 2022-01-30 ENCOUNTER — Observation Stay (HOSPITAL_COMMUNITY)
Admission: AD | Admit: 2022-01-30 | Discharge: 2022-01-31 | Disposition: A | Discharge status: Home / Self Care | Payer: Managed Care, Other (non HMO) | Attending: Neurological Surgery | Admitting: Neurological Surgery

## 2022-01-30 ENCOUNTER — Ambulatory Visit (HOSPITAL_BASED_OUTPATIENT_CLINIC_OR_DEPARTMENT_OTHER): Payer: Managed Care, Other (non HMO) | Admitting: Anesthesiology

## 2022-01-30 ENCOUNTER — Encounter (HOSPITAL_COMMUNITY)
Admission: AD | Disposition: A | Discharge status: Home / Self Care | Payer: Self-pay | Source: Home / Self Care | Attending: Neurological Surgery

## 2022-01-30 ENCOUNTER — Ambulatory Visit (HOSPITAL_COMMUNITY): Payer: Managed Care, Other (non HMO) | Admitting: Anesthesiology

## 2022-01-30 ENCOUNTER — Encounter (HOSPITAL_COMMUNITY): Payer: Self-pay | Admitting: Neurological Surgery

## 2022-01-30 ENCOUNTER — Ambulatory Visit (HOSPITAL_COMMUNITY): Payer: Managed Care, Other (non HMO)

## 2022-01-30 ENCOUNTER — Other Ambulatory Visit: Payer: Self-pay

## 2022-01-30 DIAGNOSIS — G959 Disease of spinal cord, unspecified: Secondary | ICD-10-CM | POA: Diagnosis present

## 2022-01-30 DIAGNOSIS — M542 Cervicalgia: Secondary | ICD-10-CM | POA: Diagnosis not present

## 2022-01-30 DIAGNOSIS — M4802 Spinal stenosis, cervical region: Secondary | ICD-10-CM | POA: Diagnosis not present

## 2022-01-30 DIAGNOSIS — Z6841 Body Mass Index (BMI) 40.0 and over, adult: Secondary | ICD-10-CM | POA: Diagnosis not present

## 2022-01-30 DIAGNOSIS — K219 Gastro-esophageal reflux disease without esophagitis: Secondary | ICD-10-CM | POA: Diagnosis present

## 2022-01-30 DIAGNOSIS — Z79899 Other long term (current) drug therapy: Secondary | ICD-10-CM | POA: Insufficient documentation

## 2022-01-30 DIAGNOSIS — M5412 Radiculopathy, cervical region: Secondary | ICD-10-CM | POA: Diagnosis not present

## 2022-01-30 DIAGNOSIS — Z87891 Personal history of nicotine dependence: Secondary | ICD-10-CM | POA: Diagnosis not present

## 2022-01-30 DIAGNOSIS — Z85038 Personal history of other malignant neoplasm of large intestine: Secondary | ICD-10-CM | POA: Insufficient documentation

## 2022-01-30 DIAGNOSIS — M4712 Other spondylosis with myelopathy, cervical region: Principal | ICD-10-CM | POA: Insufficient documentation

## 2022-01-30 DIAGNOSIS — E785 Hyperlipidemia, unspecified: Secondary | ICD-10-CM | POA: Diagnosis present

## 2022-01-30 DIAGNOSIS — Z9884 Bariatric surgery status: Secondary | ICD-10-CM

## 2022-01-30 DIAGNOSIS — Z9889 Other specified postprocedural states: Principal | ICD-10-CM

## 2022-01-30 DIAGNOSIS — M4722 Other spondylosis with radiculopathy, cervical region: Principal | ICD-10-CM | POA: Diagnosis present

## 2022-01-30 DIAGNOSIS — Z800 Family history of malignant neoplasm of digestive organs: Secondary | ICD-10-CM | POA: Diagnosis not present

## 2022-01-30 DIAGNOSIS — Z882 Allergy status to sulfonamides status: Secondary | ICD-10-CM | POA: Diagnosis not present

## 2022-01-30 HISTORY — PX: ANTERIOR CERVICAL DECOMP/DISCECTOMY FUSION: SHX1161

## 2022-01-30 LAB — CBC
HCT: 35.7 % — ABNORMAL LOW (ref 36.0–46.0)
Hemoglobin: 11.3 g/dL — ABNORMAL LOW (ref 12.0–15.0)
MCH: 26.7 pg (ref 26.0–34.0)
MCHC: 31.7 g/dL (ref 30.0–36.0)
MCV: 84.2 fL (ref 80.0–100.0)
Platelets: 220 10*3/uL (ref 150–400)
RBC: 4.24 MIL/uL (ref 3.87–5.11)
RDW: 15.8 % — ABNORMAL HIGH (ref 11.5–15.5)
WBC: 6 10*3/uL (ref 4.0–10.5)
nRBC: 0 % (ref 0.0–0.2)

## 2022-01-30 LAB — TYPE AND SCREEN
ABO/RH(D): O POS
Antibody Screen: NEGATIVE

## 2022-01-30 LAB — SURGICAL PCR SCREEN
MRSA, PCR: NEGATIVE
Staphylococcus aureus: NEGATIVE

## 2022-01-30 SURGERY — ANTERIOR CERVICAL DECOMPRESSION/DISCECTOMY FUSION 2 LEVELS
Anesthesia: General

## 2022-01-30 MED ORDER — DEXMEDETOMIDINE HCL IN NACL 80 MCG/20ML IV SOLN
INTRAVENOUS | Status: AC
  Filled 2022-01-30: qty 20

## 2022-01-30 MED ORDER — PROPOFOL 10 MG/ML IV BOLUS
INTRAVENOUS | Status: DC | PRN
  Administered 2022-01-30: 160 mg via INTRAVENOUS

## 2022-01-30 MED ORDER — KETAMINE HCL 50 MG/5ML IJ SOSY
PREFILLED_SYRINGE | INTRAMUSCULAR | Status: AC
  Filled 2022-01-30: qty 5

## 2022-01-30 MED ORDER — METHOCARBAMOL 500 MG PO TABS: 500.0000 mg | ORAL_TABLET | Freq: Four times a day (QID) | ORAL | Status: DC | PRN

## 2022-01-30 MED ORDER — ACETAMINOPHEN 10 MG/ML IV SOLN
INTRAVENOUS | Status: AC
  Filled 2022-01-30: qty 100

## 2022-01-30 MED ORDER — THROMBIN 5000 UNITS EX SOLR: OROMUCOSAL | Status: DC | PRN

## 2022-01-30 MED ORDER — OXYCODONE HCL 5 MG PO TABS: 5.0000 mg | ORAL_TABLET | ORAL | Status: DC | PRN

## 2022-01-30 MED ORDER — KETAMINE HCL-SODIUM CHLORIDE 100-0.9 MG/10ML-% IV SOSY
PREFILLED_SYRINGE | INTRAVENOUS | Status: DC | PRN
  Administered 2022-01-30 (×3): 10 mg via INTRAVENOUS

## 2022-01-30 MED ORDER — CHLORHEXIDINE GLUCONATE CLOTH 2 % EX PADS: 6.0000 | MEDICATED_PAD | Freq: Once | CUTANEOUS | Status: DC

## 2022-01-30 MED ORDER — DEXAMETHASONE SODIUM PHOSPHATE 10 MG/ML IJ SOLN
INTRAMUSCULAR | Status: DC | PRN
  Administered 2022-01-30: 10 mg via INTRAVENOUS

## 2022-01-30 MED ORDER — KETOROLAC TROMETHAMINE 15 MG/ML IJ SOLN
15.0000 mg | Freq: Four times a day (QID) | INTRAMUSCULAR | Status: DC
  Administered 2022-01-31 (×2): 15 mg via INTRAVENOUS
  Filled 2022-01-30 (×2): qty 1

## 2022-01-30 MED ORDER — MIDAZOLAM HCL 2 MG/2ML IJ SOLN
INTRAMUSCULAR | Status: AC
  Filled 2022-01-30: qty 2

## 2022-01-30 MED ORDER — LIDOCAINE 2% (20 MG/ML) 5 ML SYRINGE
INTRAMUSCULAR | Status: DC | PRN
  Administered 2022-01-30: 100 mg via INTRAVENOUS

## 2022-01-30 MED ORDER — FENTANYL CITRATE (PF) 100 MCG/2ML IJ SOLN
25.0000 ug | INTRAMUSCULAR | Status: DC | PRN
  Administered 2022-01-30 (×2): 50 ug via INTRAVENOUS

## 2022-01-30 MED ORDER — THROMBIN 5000 UNITS EX SOLR
CUTANEOUS | Status: AC
  Filled 2022-01-30: qty 5000

## 2022-01-30 MED ORDER — LACTATED RINGERS IV SOLN: INTRAVENOUS | Status: DC

## 2022-01-30 MED ORDER — SUGAMMADEX SODIUM 200 MG/2ML IV SOLN
INTRAVENOUS | Status: DC | PRN
  Administered 2022-01-30: 200 mg via INTRAVENOUS

## 2022-01-30 MED ORDER — FENTANYL CITRATE (PF) 100 MCG/2ML IJ SOLN
INTRAMUSCULAR | Status: AC
  Filled 2022-01-30: qty 2

## 2022-01-30 MED ORDER — FENTANYL CITRATE (PF) 250 MCG/5ML IJ SOLN
INTRAMUSCULAR | Status: DC | PRN
Start: 2022-01-30 — End: 2022-01-30
  Administered 2022-01-30: 50 ug via INTRAVENOUS
  Administered 2022-01-30: 100 ug via INTRAVENOUS

## 2022-01-30 MED ORDER — DOCUSATE SODIUM 100 MG PO CAPS
100.0000 mg | ORAL_CAPSULE | Freq: Two times a day (BID) | ORAL | Status: DC
Start: 2022-01-30 — End: 2022-01-31
  Administered 2022-01-30 – 2022-01-31 (×2): 100 mg via ORAL
  Filled 2022-01-30 (×2): qty 1

## 2022-01-30 MED ORDER — ACETAMINOPHEN 10 MG/ML IV SOLN
INTRAVENOUS | Status: DC | PRN
  Administered 2022-01-30: 1000 mg via INTRAVENOUS

## 2022-01-30 MED ORDER — PHENYLEPHRINE HCL-NACL 20-0.9 MG/250ML-% IV SOLN
INTRAVENOUS | Status: AC
  Filled 2022-01-30: qty 250

## 2022-01-30 MED ORDER — ONDANSETRON HCL 4 MG/2ML IJ SOLN
INTRAMUSCULAR | Status: DC | PRN
  Administered 2022-01-30: 4 mg via INTRAVENOUS

## 2022-01-30 MED ORDER — SODIUM CHLORIDE 0.9% FLUSH: 3.0000 mL | INTRAVENOUS | Status: DC | PRN

## 2022-01-30 MED ORDER — ACETAMINOPHEN 650 MG RE SUPP: 650.0000 mg | RECTAL | Status: DC | PRN

## 2022-01-30 MED ORDER — SODIUM CHLORIDE 0.9% FLUSH
3.0000 mL | Freq: Two times a day (BID) | INTRAVENOUS | Status: DC
  Administered 2022-01-30 – 2022-01-31 (×2): 3 mL via INTRAVENOUS

## 2022-01-30 MED ORDER — PROPOFOL 10 MG/ML IV BOLUS
INTRAVENOUS | Status: AC
  Filled 2022-01-30: qty 20

## 2022-01-30 MED ORDER — SODIUM CHLORIDE 0.9 % IV SOLN
250.0000 mL | INTRAVENOUS | Status: DC
Start: 2022-01-30 — End: 2022-01-31
  Administered 2022-01-30: 250 mL via INTRAVENOUS

## 2022-01-30 MED ORDER — FENTANYL CITRATE (PF) 250 MCG/5ML IJ SOLN
INTRAMUSCULAR | Status: AC
  Filled 2022-01-30: qty 5

## 2022-01-30 MED ORDER — CEFAZOLIN SODIUM-DEXTROSE 2-4 GM/100ML-% IV SOLN
2.0000 g | INTRAVENOUS | Status: AC
  Administered 2022-01-30: 2 g via INTRAVENOUS

## 2022-01-30 MED ORDER — ONDANSETRON HCL 4 MG/2ML IJ SOLN
INTRAMUSCULAR | Status: AC
  Filled 2022-01-30: qty 2

## 2022-01-30 MED ORDER — CEFAZOLIN SODIUM-DEXTROSE 2-4 GM/100ML-% IV SOLN
2.0000 g | Freq: Three times a day (TID) | INTRAVENOUS | Status: AC
  Administered 2022-01-30 – 2022-01-31 (×2): 2 g via INTRAVENOUS
  Filled 2022-01-30 (×2): qty 100

## 2022-01-30 MED ORDER — PROMETHAZINE HCL 25 MG/ML IJ SOLN: 6.2500 mg | INTRAMUSCULAR | Status: DC | PRN

## 2022-01-30 MED ORDER — MORPHINE SULFATE (PF) 2 MG/ML IV SOLN
2.0000 mg | INTRAVENOUS | Status: DC | PRN
  Administered 2022-01-30: 2 mg via INTRAVENOUS
  Filled 2022-01-30: qty 1

## 2022-01-30 MED ORDER — MENTHOL 3 MG MT LOZG
1.0000 | LOZENGE | OROMUCOSAL | Status: DC | PRN
Start: 2022-01-30 — End: 2022-01-31

## 2022-01-30 MED ORDER — CHLORHEXIDINE GLUCONATE 0.12 % MT SOLN
OROMUCOSAL | Status: AC
  Administered 2022-01-30: 15 mL via OROMUCOSAL
  Filled 2022-01-30: qty 15

## 2022-01-30 MED ORDER — MIDAZOLAM HCL 2 MG/2ML IJ SOLN
INTRAMUSCULAR | Status: DC | PRN
  Administered 2022-01-30: 2 mg via INTRAVENOUS

## 2022-01-30 MED ORDER — GABAPENTIN 300 MG PO CAPS
300.0000 mg | ORAL_CAPSULE | Freq: Three times a day (TID) | ORAL | Status: DC
  Administered 2022-01-30 – 2022-01-31 (×2): 300 mg via ORAL
  Filled 2022-01-30 (×2): qty 1

## 2022-01-30 MED ORDER — 0.9 % SODIUM CHLORIDE (POUR BTL) OPTIME
TOPICAL | Status: DC | PRN
  Administered 2022-01-30: 1000 mL

## 2022-01-30 MED ORDER — CEFAZOLIN SODIUM-DEXTROSE 2-4 GM/100ML-% IV SOLN
INTRAVENOUS | Status: AC
  Filled 2022-01-30: qty 100

## 2022-01-30 MED ORDER — OXYCODONE HCL 5 MG PO TABS
10.0000 mg | ORAL_TABLET | ORAL | Status: DC | PRN
  Administered 2022-01-31: 10 mg via ORAL
  Filled 2022-01-30: qty 2

## 2022-01-30 MED ORDER — ONDANSETRON HCL 4 MG/2ML IJ SOLN: 4.0000 mg | Freq: Four times a day (QID) | INTRAMUSCULAR | Status: DC | PRN

## 2022-01-30 MED ORDER — ROCURONIUM BROMIDE 10 MG/ML (PF) SYRINGE
PREFILLED_SYRINGE | INTRAVENOUS | Status: DC | PRN
  Administered 2022-01-30: 10 mg via INTRAVENOUS
  Administered 2022-01-30: 80 mg via INTRAVENOUS

## 2022-01-30 MED ORDER — PHENOL 1.4 % MT LIQD: 1.0000 | OROMUCOSAL | Status: DC | PRN

## 2022-01-30 MED ORDER — DEXAMETHASONE SODIUM PHOSPHATE 10 MG/ML IJ SOLN
INTRAMUSCULAR | Status: AC
  Filled 2022-01-30: qty 1

## 2022-01-30 MED ORDER — ACETAMINOPHEN 325 MG PO TABS: 650.0000 mg | ORAL_TABLET | ORAL | Status: DC | PRN

## 2022-01-30 MED ORDER — ONDANSETRON HCL 4 MG PO TABS: 4.0000 mg | ORAL_TABLET | Freq: Four times a day (QID) | ORAL | Status: DC | PRN

## 2022-01-30 MED ORDER — CHLORHEXIDINE GLUCONATE 0.12 % MT SOLN
15.0000 mL | Freq: Once | OROMUCOSAL | Status: AC
Start: 2022-01-30 — End: 2022-01-30

## 2022-01-30 MED ORDER — LIDOCAINE 2% (20 MG/ML) 5 ML SYRINGE
INTRAMUSCULAR | Status: AC
  Filled 2022-01-30: qty 5

## 2022-01-30 MED ORDER — METHOCARBAMOL 1000 MG/10ML IJ SOLN: 500.0000 mg | Freq: Four times a day (QID) | INTRAVENOUS | Status: DC | PRN

## 2022-01-30 MED ORDER — ORAL CARE MOUTH RINSE: 15.0000 mL | Freq: Once | OROMUCOSAL | Status: AC

## 2022-01-30 MED ORDER — DEXMEDETOMIDINE (PRECEDEX) IN NS 20 MCG/5ML (4 MCG/ML) IV SYRINGE
PREFILLED_SYRINGE | INTRAVENOUS | Status: DC | PRN
  Administered 2022-01-30: 12 ug via INTRAVENOUS

## 2022-01-30 SURGICAL SUPPLY — 64 items
APL SKNCLS STERI-STRIP NONHPOA (GAUZE/BANDAGES/DRESSINGS) ×1
BAG COUNTER SPONGE SURGICOUNT (BAG) ×3 IMPLANT
BAG SPNG CNTER NS LX DISP (BAG) ×2
BAND INSRT 18 STRL LF DISP RB (MISCELLANEOUS) ×2
BAND RUBBER #18 3X1/16 STRL (MISCELLANEOUS) ×4 IMPLANT
BENZOIN TINCTURE PRP APPL 2/3 (GAUZE/BANDAGES/DRESSINGS) ×1 IMPLANT
BIT DRILL NEURO 2X3.1 SFT TUCH (MISCELLANEOUS) ×1 IMPLANT
BUR CARBIDE MATCH 3.0 (BURR) ×2 IMPLANT
CANISTER SUCT 3000ML PPV (MISCELLANEOUS) ×2 IMPLANT
CARTRIDGE OIL MAESTRO DRILL (MISCELLANEOUS) ×1 IMPLANT
COVER MAYO STAND STRL (DRAPES) ×4 IMPLANT
DIFFUSER DRILL AIR PNEUMATIC (MISCELLANEOUS) ×2 IMPLANT
DRAPE C-ARM 42X72 X-RAY (DRAPES) ×2 IMPLANT
DRAPE HALF SHEET 40X57 (DRAPES) IMPLANT
DRAPE LAPAROTOMY 100X72X124 (DRAPES) ×2 IMPLANT
DRAPE MICROSCOPE LEICA (MISCELLANEOUS) ×2 IMPLANT
DRILL NEURO 2X3.1 SOFT TOUCH (MISCELLANEOUS) ×2
DRSG OPSITE POSTOP 4X6 (GAUZE/BANDAGES/DRESSINGS) ×1 IMPLANT
DURAPREP 6ML APPLICATOR 50/CS (WOUND CARE) ×2 IMPLANT
ELECT COATED BLADE 2.86 ST (ELECTRODE) ×2 IMPLANT
ELECT REM PT RETURN 9FT ADLT (ELECTROSURGICAL) ×2
ELECTRODE REM PT RTRN 9FT ADLT (ELECTROSURGICAL) ×1 IMPLANT
EVACUATOR 1/8 PVC DRAIN (DRAIN) IMPLANT
GAUZE 4X4 16PLY ~~LOC~~+RFID DBL (SPONGE) IMPLANT
GLOVE BIOGEL PI IND STRL 8 (GLOVE) ×1 IMPLANT
GLOVE BIOGEL PI INDICATOR 8 (GLOVE) ×1
GLOVE ECLIPSE 8.0 STRL XLNG CF (GLOVE) ×4 IMPLANT
GLOVE EXAM NITRILE LRG STRL (GLOVE) IMPLANT
GLOVE EXAM NITRILE XL STR (GLOVE) IMPLANT
GLOVE EXAM NITRILE XS STR PU (GLOVE) IMPLANT
GLOVE SURG ENC MOIS LTX SZ8 (GLOVE) ×2 IMPLANT
GLOVE SURG UNDER POLY LF SZ8.5 (GLOVE) ×2 IMPLANT
GOWN STRL REUS W/ TWL LRG LVL3 (GOWN DISPOSABLE) IMPLANT
GOWN STRL REUS W/ TWL XL LVL3 (GOWN DISPOSABLE) ×2 IMPLANT
GOWN STRL REUS W/TWL 2XL LVL3 (GOWN DISPOSABLE) IMPLANT
GOWN STRL REUS W/TWL LRG LVL3 (GOWN DISPOSABLE)
GOWN STRL REUS W/TWL XL LVL3 (GOWN DISPOSABLE) ×4
HEMOSTAT POWDER KIT SURGIFOAM (HEMOSTASIS) ×2 IMPLANT
KIT BASIN OR (CUSTOM PROCEDURE TRAY) ×2 IMPLANT
KIT TURNOVER KIT B (KITS) ×2 IMPLANT
NDL SPNL 18GX3.5 QUINCKE PK (NEEDLE) ×1 IMPLANT
NEEDLE HYPO 22GX1.5 SAFETY (NEEDLE) ×2 IMPLANT
NEEDLE SPNL 18GX3.5 QUINCKE PK (NEEDLE) ×2 IMPLANT
NS IRRIG 1000ML POUR BTL (IV SOLUTION) ×2 IMPLANT
OIL CARTRIDGE MAESTRO DRILL (MISCELLANEOUS) ×2
PACK LAMINECTOMY NEURO (CUSTOM PROCEDURE TRAY) ×2 IMPLANT
PAD ARMBOARD 7.5X6 YLW CONV (MISCELLANEOUS) ×6 IMPLANT
PATTIES SURGICAL .5 X.5 (GAUZE/BANDAGES/DRESSINGS) ×1 IMPLANT
PIN DISTRACTION 14MM (PIN) ×4 IMPLANT
PLATE CERV CONS OZARK 2X36 (Plate) ×1 IMPLANT
PUTTY BONE 100 VESUVIUS 2.5CC (Putty) ×1 IMPLANT
SCREW FIXED ST OZARK 4X16 (Screw) ×2 IMPLANT
SCREW VA ST OZARK 4X16 (Plate) ×4 IMPLANT
SPACER ANGLD CASCAD 16X13X7 7D (Spacer) ×1 IMPLANT
SPACER LORD CASCAD 13X16X6 7D (Spacer) ×1 IMPLANT
SPONGE INTESTINAL PEANUT (DISPOSABLE) ×2 IMPLANT
SPONGE SURGIFOAM ABS GEL SZ50 (HEMOSTASIS) ×2 IMPLANT
STAPLER VISISTAT 35W (STAPLE) IMPLANT
STRIP CLOSURE SKIN 1/2X4 (GAUZE/BANDAGES/DRESSINGS) ×2 IMPLANT
TAPE SURG TRANSPORE 1 IN (GAUZE/BANDAGES/DRESSINGS) ×1 IMPLANT
TAPE SURGICAL TRANSPORE 1 IN (GAUZE/BANDAGES/DRESSINGS) ×2
TOWEL GREEN STERILE (TOWEL DISPOSABLE) ×2 IMPLANT
TOWEL GREEN STERILE FF (TOWEL DISPOSABLE) ×2 IMPLANT
WATER STERILE IRR 1000ML POUR (IV SOLUTION) ×2 IMPLANT

## 2022-01-30 NOTE — Anesthesia Postprocedure Evaluation (Signed)
Anesthesia Post Note  Patient: Amy Bentley  Procedure(s) Performed: Anterior Cervical Decompression/Discectomy Fusion Cervical Four-Five, Cervical Five-Six     Patient location during evaluation: PACU Anesthesia Type: General Level of consciousness: awake and alert Pain management: pain level controlled Vital Signs Assessment: post-procedure vital signs reviewed and stable Respiratory status: spontaneous breathing, nonlabored ventilation, respiratory function stable and patient connected to nasal cannula oxygen Cardiovascular status: blood pressure returned to baseline and stable Postop Assessment: no apparent nausea or vomiting Anesthetic complications: no   No notable events documented.  Last Vitals:  Vitals:   01/30/22 1945 01/30/22 2010  BP: (!) 161/83 (!) 155/72  Pulse: (!) 55 (!) 59  Resp: 13 14  Temp: 36.8 C 36.9 C  SpO2: 97% 99%    Last Pain:  Vitals:   01/30/22 2010  TempSrc: Oral  PainSc:                  Santa Lighter

## 2022-01-30 NOTE — H&P (Signed)
Providing Compassionate, Quality Care - Together  NEUROSURGERY HISTORY & PHYSICAL   Amy Bentley is an 51 y.o. female.   Chief Complaint: Numbness tingling and bilateral upper extremity weakness HPI: This is a pleasant 51 year old female with progressive weakness, numbness tingling that was found to have severe stenosis with cord signal change at C5-6.  She has had progressive difficulty walking, dropping objects and weakness in her hands bilaterally that is been ongoing for approximately 3 months.  She has had to use a cane to ambulate over the past few weeks.  MRI also revealed moderate stenosis at C4-5 with cord abutment.  She was referred to my office due to MRI findings.  Given her significant progressive symptoms that have been ongoing over a few months, I recommended surgical intervention.  She presents today for surgical intervention in the form of a C4-6 ACDF.  Past Medical History:  Diagnosis Date   colon ca dx'd 09/2013   colon   GERD (gastroesophageal reflux disease)    Hyperlipidemia    Morbid obesity ([place])     Past Surgical History:  Procedure Laterality Date   BREAST BIOPSY Right    stereo    CESAREAN SECTION     x 2   COLON RESECTION N/A 10/20/2013   Procedure: OPEN SIGMOID COLONECTOMY COLOVESICAL FISTULA REPAIR;  Surgeon: Stark Klein, MD;  Location: [facility];  Service: General;  Laterality: N/A;   COLONOSCOPY  08/16/2014   COLONOSCOPY WITH PROPOFOL N/A 08/16/2014   Procedure: COLONOSCOPY WITH PROPOFOL;  Surgeon: Milus Banister, MD;  Location: [facility];  Service: Endoscopy;  Laterality: N/A;   COLONOSCOPY WITH PROPOFOL N/A 12/23/2017   Procedure: COLONOSCOPY WITH PROPOFOL;  Surgeon: Milus Banister, MD;  Location: [facility];  Service: Endoscopy;  Laterality: N/A;   CYSTECTOMY N/A 10/20/2013   Procedure: CYSTECTOMY PARTIAL;  Surgeon: Ardis Hughs, MD;  Location: [facility];  Service: Urology;  Laterality: N/A;   HERNIA REPAIR     LAPAROSCOPIC  GASTRIC SLEEVE RESECTION N/A 12/04/2019   Procedure: LAPAROSCOPIC GASTRIC SLEEVE RESECTION, Upper Endo, Eras Pathway;  Surgeon: Clovis Riley, MD;  Location: [facility];  Service: General;  Laterality: N/A;   PORTACATH PLACEMENT N/A 11/15/2013   Procedure: INSERTION PORT-A-CATH;  Surgeon: Stark Klein, MD;  Location: [facility];  Service: General;  Laterality: N/A;   TUBAL LIGATION     UPPER GI ENDOSCOPY N/A 12/04/2019   Procedure: UPPER GI ENDOSCOPY;  Surgeon: Clovis Riley, MD;  Location: [facility];  Service: General;  Laterality: N/A;   VENTRAL HERNIA REPAIR N/A 07/21/2017   Procedure: EXPLORATORY LAPAROTOMY AND LYSIS OF ADHESIONS WITH  REPAIR OF VENTRAL HERNIA WITH MESH ;  Surgeon: Excell Seltzer, MD;  Location: [facility];  Service: General;  Laterality: N/A;    Family History  Problem Relation Age of Onset   Cancer Paternal Aunt 52       colon   Colon cancer Paternal Aunt 3   Hypertension Other    Diabetes Other    Esophageal cancer Neg Hx    Rectal cancer Neg Hx    Stomach cancer Neg Hx    Breast cancer Neg Hx    Social History:  reports that she quit smoking about 8 years ago. Her smoking use included cigarettes. She smoked an average of .5 packs per day. She has never used smokeless tobacco. She reports that she does not currently use alcohol. She reports current drug use. Drug: Marijuana.  Allergies:  Allergies  Allergen Reactions   Bactrim [Sulfamethoxazole-Trimethoprim] Other (See Comments)    Causes disorientation, inability to speak    Medications Prior to Admission  Medication Sig Dispense Refill   acetaminophen (TYLENOL) 650 MG CR tablet Take 1,300 mg by mouth every 8 (eight) hours as needed for pain.     gabapentin (NEURONTIN) 100 MG capsule Take 2 capsules (200 mg total) by mouth every 12 (twelve) hours. (Patient taking differently: Take 300 mg by mouth 3 (three) times daily.) 20 capsule 0   ibuprofen (ADVIL) 800 MG tablet Take 1 tablet (800 mg total) by mouth every 6  (six) hours as needed for mild pain. 21 tablet 0   oxyCODONE (ROXICODONE) 5 MG immediate release tablet Take 1 tablet (5 mg total) by mouth every 6 (six) hours as needed for severe pain. 12 tablet 0    Results for orders placed or performed during the hospital encounter of 01/30/22 (from the past 48 hour(s))  Surgical pcr screen     Status: None   Collection Time: 01/30/22 11:02 AM   Specimen: Nasal Mucosa; Nasal Swab  Result Value Ref Range   MRSA, PCR NEGATIVE NEGATIVE   Staphylococcus aureus NEGATIVE NEGATIVE    Comment: (NOTE) The Xpert SA Assay (FDA approved for NASAL specimens in patients 69 years of age and older), is one component of a comprehensive surveillance program. It is not intended to diagnose infection nor to guide or monitor treatment. Performed at [facility], [place] [address], [place], [place] [address]   CBC     Status: Abnormal   Collection Time: 01/30/22 11:58 AM  Result Value Ref Range   WBC 6.0 4.0 - 10.5 K/uL   RBC 4.24 3.87 - 5.11 MIL/uL   Hemoglobin 11.3 (L) 12.0 - 15.0 g/dL   HCT 35.7 (L) 36.0 - 46.0 %   MCV 84.2 80.0 - 100.0 fL   MCH 26.7 26.0 - 34.0 pg   MCHC 31.7 30.0 - 36.0 g/dL   RDW 15.8 (H) 11.5 - 15.5 %   Platelets 220 150 - 400 K/uL   nRBC 0.0 0.0 - 0.2 %    Comment: Performed at [facility], [place] [address], [place], [place] [address]  Type and screen     Status: None   Collection Time: 01/30/22 12:00 PM  Result Value Ref Range   ABO/RH(D) O POS    Antibody Screen NEG    Sample Expiration      02/02/2022,2359 Performed at [facility], [place] [address], [place], [place] [address]    No results found.  ROS All pertinent positives and negatives are listed HPI above  Blood pressure (!) 151/83, pulse (!) 45, temperature 98.6 F (37 C), temperature source Oral, resp. rate 18, height '5\' 1"'$  (1.549 m), weight 97.5 kg, last menstrual period 11/17/2021, SpO2 98 %. Physical Exam  Awake alert, oriented x3 PERRLA Bilateral  upper extremities 4/5 throughout except grip 4 -/5 bilaterally Positive Hoffmann's bilaterally, brisk Bilateral lower extremities 4/5 throughout Positive clonus bilateral lower extremities DTRs bilateral upper/lower extremity 3/4 throughout Nonlabored breathing  Assessment/Plan 51 year old female with  Cervical spondylotic myelopathy, severe stenosis C5-6 with cord signal change, moderate stenosis C4-5  -OR today for ACDF C4-6.  We discussed all risks, benefits and expected outcomes.  Informed consent was obtained.  I answered all of her questions as well as her family's questions.  Thank you for allowing me to participate in this patient's care.  Please do not hesitate to  call with questions or concerns.   Elwin Sleight, [facility] Cell: [phone]

## 2022-01-30 NOTE — Progress Notes (Signed)
Orthopedic Tech Progress Note Patient Details:  Amy Bentley 18-Apr-1971 [id number]  Patient ID: Amy Bentley, female   DOB: 1971-01-13, 51 y.o.   MRN: [id number] [place] dropped off with PACU RN.  Vernona Rieger 01/30/2022, 7:21 PM

## 2022-01-30 NOTE — Anesthesia Procedure Notes (Signed)
Procedure Name: Intubation Date/Time: 01/30/2022 4:17 PM  Performed by: Dorann Lodge, CRNAPre-anesthesia Checklist: Patient identified, Emergency Drugs available, Suction available and Patient being monitored Patient Re-evaluated:Patient Re-evaluated prior to induction Oxygen Delivery Method: Circle System Utilized Preoxygenation: Pre-oxygenation with 100% oxygen Induction Type: IV induction Ventilation: Mask ventilation without difficulty Laryngoscope Size: Glidescope and 3 Grade View: Grade I Tube type: Oral Tube size: 7.0 mm Number of attempts: 1 Airway Equipment and Method: Video-laryngoscopy and Rigid stylet Placement Confirmation: ETT inserted through vocal cords under direct vision, positive ETCO2 and breath sounds checked- equal and bilateral Secured at: 21 cm Tube secured with: Tape Dental Injury: Teeth and Oropharynx as per pre-operative assessment  Comments: Elective glidescope used for limited neck mobility. Neck neutrality maintained throughout.

## 2022-01-30 NOTE — Anesthesia Preprocedure Evaluation (Addendum)
Anesthesia Evaluation  Patient identified by MRN, date of birth, ID band Patient awake    Reviewed: Allergy & Precautions, NPO status , Patient's Chart, lab work & pertinent test results  Airway Mallampati: II  TM Distance: >3 FB Neck ROM: Full    Dental no notable dental hx. (+) Teeth Intact, Dental Advisory Given   Pulmonary former smoker,    Pulmonary exam normal breath sounds clear to auscultation       Cardiovascular Exercise Tolerance: Good Normal cardiovascular exam Rhythm:Regular Rate:Normal     Neuro/Psych    GI/Hepatic Neg liver ROS, GERD  ,Hx of colon CA  S/P sleeve gastrectomy   Endo/Other    Renal/GU      Musculoskeletal   Abdominal   Peds  Hematology Lab Results      Component                Value               Date                      WBC                      6.0                 01/30/2022                HGB                      11.3 (L)            01/30/2022                HCT                      35.7 (L)            01/30/2022                MCV                      84.2                01/30/2022                PLT                      220                 01/30/2022              Anesthesia Other Findings All: Sulfa  Reproductive/Obstetrics                            Anesthesia Physical Anesthesia Plan  ASA: 3  Anesthesia Plan: General   Post-op Pain Management: Ketamine IV* and Dilaudid IV   Induction: Intravenous  PONV Risk Score and Plan: Treatment may vary due to age or medical condition, Ondansetron, Midazolam and Dexamethasone  Airway Management Planned: Oral ETT and Video Laryngoscope Planned  Additional Equipment: None  Intra-op Plan:   Post-operative Plan: Extubation in OR  Informed Consent: I have reviewed the patients History and Physical, chart, labs and discussed the procedure including the risks, benefits and alternatives for the proposed  anesthesia with the patient or authorized representative who has indicated his/her understanding and acceptance.  Dental advisory given  Plan Discussed with:   Anesthesia Plan Comments:        Anesthesia Quick Evaluation

## 2022-01-30 NOTE — Op Note (Signed)
Providing Compassionate, Quality Care - Together  Date of service: 01/30/2022  PREOP DIAGNOSIS: Cervical spondylosis with severe stenosis, cord signal change C5-6, moderate stenosis C4-5, cervical myelopathy with cord signal change  POSTOP DIAGNOSIS: Same  PROCEDURE: 1. Arthrodesis C4-5, C5-6, anterior interbody technique  2. Placement of intervertebral biomechanical device C4-5, C5-6 (: K2 M [place] titanium interbody C4-5: 7 x 13 x 16 mm, C5-6 6 x 13 x 16 mm) 3. Placement of anterior instrumentation consisting of interbody plate and screws -K2 M [place] plate, 36 mm, bilateral screws 4.0 x 16 at C4, 4.0 x 16 at C5,'s 4.0 x 16 mm at C6 4. Discectomy at C4-5, C5-6 for decompression of spinal cord and exiting nerve roots  5. Use of morselized bone allograft  6.  Use of autograft, same incision 7. Use of intraoperative microscope  SURGEON: Dr. Elwin Sleight, DO  ASSISTANT: Dr. Earle Gell, MD  ANESTHESIA: General Endotracheal  EBL: 50 cc  SPECIMENS: None  DRAINS: None  COMPLICATIONS: None immediate  CONDITION: Hemodynamically stable to PACU  HISTORY: Amy Bentley is a 51 y.o. y.o. female who initially presented to the outpatient clinic with signs and symptoms consistent with myelopathy.she has had progressive worsening since late May, early June of weakness in her hands primarily her grip, difficulty with her balance progressing to needing a cane to ambulate.  She went to the emergency department was found to have severe cervical stenosis at C5-6 with cord signal change, moderate stenosis at C4-5.  She was referred to my office and due to her progressive symptoms of myelopathy I recommended an ACDF C4-6.  We discussed all risks, benefits and expected outcomes.  I answered all of her questions.  Informed consent was obtained.  PROCEDURE IN DETAIL: The patient was brought to the operating room and transferred to the operative table. After induction of general anesthesia,  the patient was positioned on the operative table in the supine position with all pressure points meticulously padded. The skin of the neck was then prepped and draped in the usual sterile fashion.  Physician driven timeout was performed.  After timeout was conducted, skin incision was then made sharply with a 10 blade and Bovie electrocautery was used to dissect the subcutaneous tissue until the platysma was identified. The platysma was then divided and undermined. The sternocleidomastoid muscle was then identified and, utilizing natural fascial planes in the neck, the prevertebral fascia was identified and the carotid sheath was retracted laterally and the trachea and esophagus retracted medially. Again using fluoroscopy, the correct disc space was identified. Bovie electrocautery was used to dissect in the subperiosteal plane and elevate the bilateral longus coli muscles. Self-retaining retractors were then placed under the longus coli muscles bilaterally at C4, C5, C6. At this point, the microscope was draped and brought into the field, and the remainder of the case was done under the microscope using microdissecting technique.  ACDF C4-5: Distraction pins were placed in midline above and below the disc space.  The disc  space was placed in distraction.  Anterior osteophytes were removed and saved for autograft.  The disc space was incised sharply and rongeurs were use to initially complete a discectomy. The high-speed drill was then used to complete discectomy until the posterior annulus was identified and removed and the posterior longitudinal ligament was identified. Using microcurettes, the PLL was elevated, and Kerrison rongeurs were used to remove the posterior longitudinal ligament and the ventral thecal sac was identified. Using a combination of curettes  and ronguers, complete decompression of the thecal sac and exiting nerve roots at this level was completed, and verified using micro-nerve hook. The  disc space was taken out of distraction.  Epidural hemostasis was achieved with Surgifoam.  Having completed our decompression, attention was turned to placement of the intervertebral device. Trial spacers were used to select a 45m graft. This graft was then filled with morcellized allograft, and inserted under live fluoroscopy.  ACDF C5-6: Distraction pins were placed in midline above and below the disc space.  The disc  space was placed in distraction.  Anterior osteophytes were removed and saved for autograft.  The disc space was incised sharply and rongeurs were use to initially complete a discectomy. The high-speed drill was then used to complete discectomy until the posterior annulus was identified and removed and the posterior longitudinal ligament was identified. Using microcurettes, the PLL was elevated, and Kerrison rongeurs were used to remove the posterior longitudinal ligament and the ventral thecal sac was identified. Using a combination of curettes and ronguers, complete decompression of the thecal sac and exiting nerve roots at this level was completed, and verified using micro-nerve hook.  There was significant right-sided disc herniations along the lateral thecal sac that were removed.  The disc space was taken out of distraction.  Epidural hemostasis was achieved with Surgifoam.  Having completed our decompression, attention was turned to placement of the intervertebral device. Trial spacers were used to select a 637mgraft. This graft was then filled with morcellized allograft, and inserted under live fluoroscopy.  After placement of the intervertebral device, the above anterior cervical plate was selected, and placed across the interspaces. Using a high-speed drill, the cortex of the cervical vertebral bodies was punctured at C4, C5, C6, and screws inserted with appropriate bony purchase. Final fluoroscopic images in AP and lateral projections were taken to confirm good hardware  placement.  The plate was final tightened to the manufacturer's recommendation and the screws were locked in place.  At this point, after all counts were verified to be correct, meticulous hemostasis was secured using a combination of bipolar electrocautery and passive hemostatics.  Skin was closed with staples.  Sterile dressing was applied.  The patient tolerated the procedure well and was extubated in the room and taken to the postanesthesia care unit in stable condition.

## 2022-01-30 NOTE — Transfer of Care (Signed)
Immediate Anesthesia Transfer of Care Note  Patient: Amy Bentley  Procedure(s) Performed: Anterior Cervical Decompression/Discectomy Fusion Cervical Four-Five, Cervical Five-Six  Patient Location: PACU  Anesthesia Type:General  Level of Consciousness: awake and alert   Airway & Oxygen Therapy: Patient Spontanous Breathing and Patient connected to face mask oxygen  Post-op Assessment: Report given to RN, Post -op Vital signs reviewed and stable and Patient moving all extremities X 4  Post vital signs: Reviewed and stable  Last Vitals:  Vitals Value Taken Time  BP 148/88 01/30/22 1855  Temp    Pulse 79 01/30/22 1859  Resp 18 01/30/22 1859  SpO2 97 % 01/30/22 1859  Vitals shown include unvalidated device data.  Last Pain:  Vitals:   01/30/22 1148  TempSrc:   PainSc: 10-Worst pain ever         Complications: No notable events documented.

## 2022-01-31 ENCOUNTER — Other Ambulatory Visit: Payer: Self-pay

## 2022-01-31 DIAGNOSIS — M4712 Other spondylosis with myelopathy, cervical region: Secondary | ICD-10-CM | POA: Diagnosis not present

## 2022-01-31 MED ORDER — OXYCODONE-ACETAMINOPHEN 5-325 MG PO TABS: 1.0000 | ORAL_TABLET | ORAL | 0 refills | Status: AC | PRN

## 2022-01-31 MED ORDER — METHOCARBAMOL 500 MG PO TABS: 500.0000 mg | ORAL_TABLET | Freq: Four times a day (QID) | ORAL | 0 refills | Status: DC | PRN

## 2022-01-31 MED ORDER — DOCUSATE SODIUM 100 MG PO CAPS: 100.0000 mg | ORAL_CAPSULE | Freq: Two times a day (BID) | ORAL | 0 refills | Status: AC

## 2022-01-31 NOTE — Evaluation (Addendum)
Occupational Therapy Evaluation and Discharge Patient Details Name: Jenasia Dolinar MRN: [id number] DOB: Oct 28, 1970 Today's Date: 01/31/2022   History of Present Illness This is a pleasant 51 year old female with progressive weakness, numbness tingling that was found to have severe stenosis with cord signal change at C5-6.  She has had progressive difficulty walking, dropping objects and weakness in her hands bilaterally that is been ongoing for approximately 3 months. Pt is now s/p discectomy at C4-5, C5-6 for decompression of spinal cord and exiting nerve roots and fusion C4-5, C5-6.   Clinical Impression   This 51 yo female admitted and underwent above presents to acute OT with all education completed and outpatient OT recommended. She has decreased AROM and grip strength in both hands, sensation is intact (she does report tingling in hands when something touches them), decreased sensation in LLE and decreased strength in RLE.Pt to D/C home today so we will D/C from acute OT.      Recommendations for follow up therapy are one component of a multi-disciplinary discharge planning process, led by the attending physician.  Recommendations may be updated based on patient status, additional functional criteria and insurance authorization.   Follow Up Recommendations  Outpatient OT    Assistance Recommended at Discharge PRN  Patient can return home with the following Assistance with cooking/housework;Help with stairs or ramp for entrance    Functional Status Assessment  Patient has had a recent decline in their functional status and demonstrates the ability to make significant improvements in function in a reasonable and predictable amount of time. (OPOT is recommended)  Equipment Recommendations  None recommended by OT       Precautions / Restrictions Precautions Precautions: Cervical Precaution Booklet Issued: Yes (comment) Required Braces or Orthoses: Cervical Brace Cervical  Brace: Hard collar (pt reports MD told her for comfort, but needs it on when up and about) Restrictions Weight Bearing Restrictions: No      Mobility Bed Mobility Overal bed mobility: Independent                  Transfers Overall transfer level: Modified independent Equipment used: Rolling walker (2 wheels), Straight cane                      Balance Overall balance assessment: Mild deficits observed, not formally tested                                         ADL either performed or assessed with clinical judgement   ADL Overall ADL's : Modified independent (from RW for longer distances and SPC for shorter distances standpoint)                                             Vision Patient Visual Report: No change from baseline              Pertinent Vitals/Pain Pain Assessment Pain Assessment: No/denies pain     Hand Dominance Right   Extremity/Trunk Assessment Upper Extremity Assessment Upper Extremity Assessment: RUE deficits/detail;LUE deficits/detail RUE Deficits / Details: Decreased composite grip. Can oppose fingers, fully extend fingers, abduct/adduct fingers. Reports tingling when hand/fingers touch surface or are touched (ie: when I was testing sensation) RUE Coordination: decreased gross motor LUE Deficits / Details:  Decreased composite grip. Can oppose fingers, fully extend fingers, abduct/adduct fingers.Reports tingling when hand/fingers touch surface or are touched (ie: when I was testing sensation) LUE Coordination: decreased gross motor           Communication Communication Communication: No difficulties   Cognition Arousal/Alertness: Awake/alert Behavior During Therapy: WFL for tasks assessed/performed Overall Cognitive Status: Within Functional Limits for tasks assessed                                          Exercises Other Exercises Other Exercises: issued pt a container  of red theraputty and sequence of activity instruction sheet        Home Living Family/patient expects to be discharged to:: Private residence Living Arrangements: Children;Parent;Other relatives Available Help at Discharge: Family;Available 24 hours/day Type of Home: House Home Access: Level entry     Home Layout: One level     Bathroom Shower/Tub: Tub/shower unit;Curtain   Biochemist, clinical: Standard     Home Equipment: Conservation officer, nature (2 wheels);Cane - single point;Shower seat          Prior Functioning/Environment Prior Level of Function : Independent/Modified Independent                        OT Problem List: Decreased strength;Decreased range of motion;Impaired balance (sitting and/or standing);Impaired UE functional use;Impaired sensation         OT Goals(Current goals can be found in the care plan section) Acute Rehab OT Goals Patient Stated Goal: go home today and continue to get therapy         AM-PAC OT "6 Clicks" Daily Activity     Outcome Measure Help from another person eating meals?: None Help from another person taking care of personal grooming?: None Help from another person toileting, which includes using toliet, bedpan, or urinal?: None Help from another person bathing (including washing, rinsing, drying)?: None Help from another person to put on and taking off regular upper body clothing?: None Help from another person to put on and taking off regular lower body clothing?: None 6 Click Score: 24   End of Session Equipment Utilized During Treatment: Gait belt;Rolling walker (2 wheels);Cervical collar [facility]) Nurse Communication:  (ready to go from therapy standpoint as soon as OPPT/OT is set up)  Activity Tolerance: Patient tolerated treatment well Patient left: in chair;with call bell/phone within reach  OT Visit Diagnosis: Unsteadiness on feet (R26.81);Other abnormalities of gait and mobility (R26.89);Muscle weakness (generalized)  (M62.81)                Time: 8295-6213 OT Time Calculation (min): 45 min Charges:  OT General Charges $OT Visit: 1 Visit OT Evaluation $OT Eval Moderate Complexity: 1 Mod OT Treatments $Self Care/Home Management : 8-22 mins $Therapeutic Exercise: 8-22 mins  [place] [address], OTR/L Acute Rehab Services [facility] [phone] Office [phone]    [name] 01/31/2022, 11:24 AM

## 2022-01-31 NOTE — Evaluation (Signed)
Physical Therapy Evaluation Patient Details Name: Amy Bentley MRN: [id number] DOB: 1971/06/05 Today's Date: 01/31/2022  History of Present Illness  This is a pleasant 51 year old female with progressive weakness, numbness tingling that was found to have severe stenosis with cord signal change at C5-6.  She has had progressive difficulty walking, dropping objects and weakness in her hands bilaterally that is been ongoing for approximately 3 months. Pt is now s/p discectomy at C4-5, C5-6 for decompression of spinal cord and exiting nerve roots and fusion C4-5, C5-6.  Clinical Impression  Pt presented OOB, ambulating in hallway with OT, awake and willing to participate in therapy session. Prior to admission, pt reported that up until the beginning of June she was completely independent with all functional mobility and ADLs. Since the beginning of June, pt reported that she has progressively worsened and required use of a cane for ambulation and has been attending OP PT. At the time of evaluation, pt overall moving quite well. She was able to complete transfers at a modified independent level and ambulate in the hallway with use of RW and supervision for safety. Plan is for pt to d/c home today with family support. Pt would continue to benefit from skilled physical therapy services at this time while admitted and after d/c to address the below listed limitations in order to improve overall safety and independence with functional mobility.      Recommendations for follow up therapy are one component of a multi-disciplinary discharge planning process, led by the attending physician.  Recommendations may be updated based on patient status, additional functional criteria and insurance authorization.  Follow Up Recommendations Outpatient PT      Assistance Recommended at Discharge Intermittent Supervision/Assistance  Patient can return home with the following  A little help with  bathing/dressing/bathroom;Assistance with cooking/housework;Assist for transportation    Equipment Recommendations None recommended by PT  Recommendations for Other Services       Functional Status Assessment Patient has had a recent decline in their functional status and demonstrates the ability to make significant improvements in function in a reasonable and predictable amount of time.     Precautions / Restrictions Precautions Precautions: Cervical Precaution Booklet Issued: No Precaution Comments: OT gave pt handout Required Braces or Orthoses: Cervical Brace Cervical Brace: Hard collar Restrictions Weight Bearing Restrictions: No      Mobility  Bed Mobility               General bed mobility comments: pt OOB upon PT arrival    Transfers Overall transfer level: Modified independent Equipment used: Rolling walker (2 wheels)                    Ambulation/Gait Ambulation/Gait assistance: Supervision Gait Distance (Feet): 100 Feet Assistive device: Rolling walker (2 wheels) Gait Pattern/deviations: Step-through pattern Gait velocity: decreased     General Gait Details: pt with slow, cautious but overall steady gait with use of RW; no overt LOB or need for physical assistance  Stairs            Wheelchair Mobility    Modified Rankin (Stroke Patients Only)       Balance Overall balance assessment: Mild deficits observed, not formally tested                                           Pertinent Vitals/Pain Pain Assessment Pain  Assessment: No/denies pain    Home Living Family/patient expects to be discharged to:: Private residence Living Arrangements: Children;Parent;Other relatives Available Help at Discharge: Family;Available 24 hours/day Type of Home: House Home Access: Level entry       Home Layout: One level Home Equipment: Conservation officer, nature (2 wheels);Cane - single point;Shower seat      Prior Function Prior  Level of Function : Independent/Modified Independent             Mobility Comments: Pt was attending OP PT prior to sx       Hand Dominance   Dominant Hand: Right    Extremity/Trunk Assessment   Upper Extremity Assessment Upper Extremity Assessment: Defer to OT evaluation RUE Deficits / Details: Decreased composite grip. Can oppose fingers, fully extend fingers, abduct/adduct fingers. Reports tingling when hand/fingers touch surface or are touched (ie: when I was testing sensation) RUE Coordination: decreased gross motor LUE Deficits / Details: Decreased composite grip. Can oppose fingers, fully extend fingers, abduct/adduct fingers.Reports tingling when hand/fingers touch surface or are touched (ie: when I was testing sensation) LUE Coordination: decreased gross motor    Lower Extremity Assessment Lower Extremity Assessment: RLE deficits/detail;LLE deficits/detail RLE Deficits / Details: Pt with 4/5 on MMT for hip flexion, knee flexion, knee extension; 5/5 for ankle dorsiflexion LLE Deficits / Details: no strength deficits; however, pt with decreased sensation to light touch throughout    Cervical / Trunk Assessment Cervical / Trunk Assessment: Neck Surgery  Communication   Communication: No difficulties  Cognition Arousal/Alertness: Awake/alert Behavior During Therapy: WFL for tasks assessed/performed Overall Cognitive Status: Within Functional Limits for tasks assessed                                          General Comments      Exercises     Assessment/Plan    PT Assessment Patient needs continued PT services  PT Problem List Decreased strength;Decreased range of motion;Decreased activity tolerance;Decreased balance;Decreased mobility;Decreased coordination;Decreased knowledge of use of DME;Decreased safety awareness;Decreased knowledge of precautions       PT Treatment Interventions DME instruction;Gait training;Stair training;Functional  mobility training;Therapeutic activities;Therapeutic exercise;Neuromuscular re-education;Balance training;Patient/family education    PT Goals (Current goals can be found in the Care Plan section)  Acute Rehab PT Goals Patient Stated Goal: to return to being independent PT Goal Formulation: With patient Time For Goal Achievement: 02/14/22 Potential to Achieve Goals: Good    Frequency Min 5X/week     Co-evaluation   Reason for Co-Treatment:  (came upon PT in hallway so they could address pt's gait, strength, and sensation)   OT goals addressed during session: ADL's and self-care;Strengthening/ROM       AM-PAC PT "6 Clicks" Mobility  Outcome Measure Help needed turning from your back to your side while in a flat bed without using bedrails?: None Help needed moving from lying on your back to sitting on the side of a flat bed without using bedrails?: A Little Help needed moving to and from a bed to a chair (including a wheelchair)?: None Help needed standing up from a chair using your arms (e.g., wheelchair or bedside chair)?: None Help needed to walk in hospital room?: A Little Help needed climbing 3-5 steps with a railing? : A Little 6 Click Score: 21    End of Session Equipment Utilized During Treatment: Gait belt;Cervical collar Activity Tolerance: Patient tolerated treatment well  Patient left: in bed;with call bell/phone within reach;Other (comment) (sitting EOB with OT present) Nurse Communication: Mobility status PT Visit Diagnosis: Other abnormalities of gait and mobility (R26.89)    Time: 1020-1035 PT Time Calculation (min) (ACUTE ONLY): 15 min   Charges:   PT Evaluation $PT Eval Low Complexity: 1 Low          Eduard Clos, PT, DPT  Acute Rehabilitation Services Office [place] 01/31/2022, 11:28 AM

## 2022-01-31 NOTE — Discharge Summary (Signed)
Physician Discharge Summary  Patient ID: Amy Bentley MRN: [id number] DOB/AGE: 1971-04-15 51 y.o.  Admit date: 01/30/2022 Discharge date: 01/31/2022  Admission Diagnoses: Cervical myelopathy, cervicalgia, cervical radiculopathy  Discharge Diagnoses: The same Principal Problem:   S/P cervical discectomy Active Problems:   Cervical myelopathy [facility])   Discharged Condition: good  Hospital Course: Dr. Reatha Armour performed a C4-5 and C5-6 anterior cervicectomy fusion and plating on the patient on 01/30/2022.  The patient's postoperative course was unremarkable.  On postoperative day 1 she felt well and requested discharge to home.  She was given verbal and written discharge instructions.  All her questions were answered.  Consults: PT, OT, care management Significant Diagnostic Studies: None Treatments: C4-5 and C5-6 anterior cervical discectomy fusion and plating. Discharge Exam: Blood pressure (!) 140/69, pulse 80, temperature 98.9 F (37.2 C), temperature source Oral, resp. rate 20, height '5\' 1"'$  (1.549 m), weight 97.5 kg, last menstrual period 11/17/2021, SpO2 99 %. The patient is alert and pleasant.  Her dressing has a small bloodstain.  There is no hematoma or shift.  Her strength is normal.  She looks well.  Disposition: Home  Discharge Instructions     Call MD for:  difficulty breathing, headache or visual disturbances   Complete by: As directed    Call MD for:  extreme fatigue   Complete by: As directed    Call MD for:  hives   Complete by: As directed    Call MD for:  persistant dizziness or light-headedness   Complete by: As directed    Call MD for:  persistant nausea and vomiting   Complete by: As directed    Call MD for:  redness, tenderness, or signs of infection (pain, swelling, redness, odor or green/yellow discharge around incision site)   Complete by: As directed    Call MD for:  severe uncontrolled pain   Complete by: As directed    Call MD for:   temperature >100.4   Complete by: As directed    Diet - low sodium heart healthy   Complete by: As directed    Discharge instructions   Complete by: As directed    Call [phone] for a followup appointment. Take a stool softener while you are using pain medications.   Driving Restrictions   Complete by: As directed    Do not drive for 2 weeks.   Incentive spirometry RT   Complete by: As directed    Increase activity slowly   Complete by: As directed    Lifting restrictions   Complete by: As directed    Do not lift more than 5 pounds. No excessive bending or twisting.   May shower / Bathe   Complete by: As directed    Remove the dressing for 3 days after surgery.  You may shower, but leave the incision alone.   Remove dressing in 48 hours   Complete by: As directed       Allergies as of 01/31/2022       Reactions   Bactrim [sulfamethoxazole-trimethoprim] Other (See Comments)   Causes disorientation, inability to speak        Medication List     STOP taking these medications    acetaminophen 650 MG CR tablet Commonly known as: TYLENOL   ibuprofen 800 MG tablet Commonly known as: ADVIL   oxyCODONE 5 MG immediate release tablet Commonly known as: Roxicodone       TAKE these medications    docusate sodium 100 MG capsule Commonly  known as: COLACE Take 1 capsule (100 mg total) by mouth 2 (two) times daily.   gabapentin 100 MG capsule Commonly known as: NEURONTIN Take 2 capsules (200 mg total) by mouth every 12 (twelve) hours. What changed:  how much to take when to take this   methocarbamol 500 MG tablet Commonly known as: ROBAXIN Take 1 tablet (500 mg total) by mouth every 6 (six) hours as needed for muscle spasms.   oxyCODONE-acetaminophen 5-325 MG tablet Commonly known as: Percocet Take 1 tablet by mouth every 4 (four) hours as needed for severe pain.         Signed: Ophelia Charter 01/31/2022, 10:19 AM

## 2022-01-31 NOTE — TOC Transition Note (Signed)
Transition of Care [facility]) - CM/SW Discharge Note   Patient Details  Name: Amy Bentley MRN: [id number] Date of Birth: 05/13/1971  Transition of Care [facility]) CM/SW Contact:  Carles Collet, RN Phone Number: 01/31/2022, 11:07 AM   Clinical Narrative:    Referral made to [facility] Neuro for OP PTOT          Patient Goals and CMS Choice        Discharge Placement                       Discharge Plan and Services                                     Social Determinants of Health (SDOH) Interventions     Readmission Risk Interventions     No data to display

## 2022-02-02 ENCOUNTER — Encounter (HOSPITAL_COMMUNITY): Payer: Self-pay | Admitting: Neurological Surgery

## 2022-02-04 ENCOUNTER — Ambulatory Visit: Payer: Managed Care, Other (non HMO) | Attending: Neurological Surgery

## 2022-02-04 DIAGNOSIS — M6281 Muscle weakness (generalized): Secondary | ICD-10-CM

## 2022-02-04 DIAGNOSIS — G959 Disease of spinal cord, unspecified: Secondary | ICD-10-CM | POA: Insufficient documentation

## 2022-02-04 DIAGNOSIS — R2689 Other abnormalities of gait and mobility: Secondary | ICD-10-CM | POA: Diagnosis present

## 2022-02-04 NOTE — Therapy (Signed)
OUTPATIENT PHYSICAL THERAPY NEURO EVALUATION   Patient Name: Amy Bentley MRN: [id number] DOB:01/17/1971, 51 y.o., female Today's Date: 02/04/2022   PCP: Thayer Ohm, PA REFERRING PROVIDER: Dawley, Theodoro Doing, DO    PT End of Session - 02/04/22 1054     Visit Number 1    Number of Visits 17    Date for PT Re-Evaluation 04/01/22    Authorization Type [employer]; $35 copay (Same day 1 copay)  No Ded  OPM $2500/$5000 - $1159 Met  VL: Unlimited    PT Start Time 1015    PT Stop Time 1100    PT Time Calculation (min) 45 min    Equipment Utilized During Treatment Gait belt;Cervical collar    Activity Tolerance Patient tolerated treatment well    Behavior During Therapy WFL for tasks assessed/performed             Past Medical History:  Diagnosis Date   colon ca dx'd 09/2013   colon   GERD (gastroesophageal reflux disease)    Hyperlipidemia    Morbid obesity ([place])    Past Surgical History:  Procedure Laterality Date   ANTERIOR CERVICAL DECOMP/DISCECTOMY FUSION N/A 01/30/2022   Procedure: Anterior Cervical Decompression/Discectomy Fusion Cervical Four-Five, Cervical Five-Six;  Surgeon: Karsten Ro, DO;  Location: [place];  Service: Neurosurgery;  Laterality: N/A;  Anterior Cervical Decompression/Discectomy Fusion Cervical Four-Five, Cervical Five-Six   BREAST BIOPSY Right    stereo    CESAREAN SECTION     x 2   COLON RESECTION N/A 10/20/2013   Procedure: OPEN SIGMOID COLONECTOMY COLOVESICAL FISTULA REPAIR;  Surgeon: Stark Klein, MD;  Location: [facility];  Service: General;  Laterality: N/A;   COLONOSCOPY  08/16/2014   COLONOSCOPY WITH PROPOFOL N/A 08/16/2014   Procedure: COLONOSCOPY WITH PROPOFOL;  Surgeon: Milus Banister, MD;  Location: [facility];  Service: Endoscopy;  Laterality: N/A;   COLONOSCOPY WITH PROPOFOL N/A 12/23/2017   Procedure: COLONOSCOPY WITH PROPOFOL;  Surgeon: Milus Banister, MD;  Location: [facility];  Service: Endoscopy;  Laterality: N/A;    CYSTECTOMY N/A 10/20/2013   Procedure: CYSTECTOMY PARTIAL;  Surgeon: Ardis Hughs, MD;  Location: [facility];  Service: Urology;  Laterality: N/A;   HERNIA REPAIR     LAPAROSCOPIC GASTRIC SLEEVE RESECTION N/A 12/04/2019   Procedure: LAPAROSCOPIC GASTRIC SLEEVE RESECTION, Upper Endo, Eras Pathway;  Surgeon: Clovis Riley, MD;  Location: [facility];  Service: General;  Laterality: N/A;   PORTACATH PLACEMENT N/A 11/15/2013   Procedure: INSERTION PORT-A-CATH;  Surgeon: Stark Klein, MD;  Location: [facility];  Service: General;  Laterality: N/A;   TUBAL LIGATION     UPPER GI ENDOSCOPY N/A 12/04/2019   Procedure: UPPER GI ENDOSCOPY;  Surgeon: Clovis Riley, MD;  Location: [facility];  Service: General;  Laterality: N/A;   VENTRAL HERNIA REPAIR N/A 07/21/2017   Procedure: EXPLORATORY LAPAROTOMY AND LYSIS OF ADHESIONS WITH  REPAIR OF VENTRAL HERNIA WITH MESH ;  Surgeon: Excell Seltzer, MD;  Location: [facility];  Service: General;  Laterality: N/A;   Patient Active Problem List   Diagnosis Date Noted   S/P cervical discectomy 01/30/2022   Cervical myelopathy ([place]) 01/30/2022   Morbid obesity ([place])    History of colon cancer    S/P laparoscopic procedure 07/21/2017   Incisional hernia, reducible 07/16/2017   Nausea and vomiting 07/12/2017   Malignant neoplasm of sigmoid colon ([place]) 03/13/2014   Family history of malignant neoplasm of gastrointestinal tract 03/13/2014   Cancer of sigmoid colon,  invading bladder, T4N0, s/p en bloc resection 10/20/2013 11/06/2013   Protein-calorie malnutrition, severe ([place]) 10/19/2013   Iron deficiency anemia, multifactorial with GI/GU and menstrual losses contributory 10/18/2013   Diverticulitis of colon (without mention of hemorrhage)(562.11) 10/18/2013   Weight loss 10/17/2013   Altered mental status 08/07/2013   UTI (lower urinary tract infection) 08/07/2013   Hypokalemia 08/07/2013   Anemia 08/07/2013    ONSET DATE: 10/01/21  REFERRING DIAG: G95.9 (ICD-10-CM) -  Cervical myelopathy ([place])   THERAPY DIAG:  Other abnormalities of gait and mobility  Muscle weakness (generalized)  Rationale for Evaluation and Treatment Rehabilitation  SUBJECTIVE:                                                                                                                                                                                              SUBJECTIVE STATEMENT: Pt is S/P C4-6 ACDF on 01/30/22. Patient had progressive weakness, numbness tingling prior to surgery where MRI revealed severe stenosis of spinal cord at C5-6 and moderate stenosis at C4-5 with cord abutment. She had progressive difficulty with walking, dropping objects and weakness in bil hans that was going on for >3 months prior to surgery. She had to use cane past few weeks before her surgery. After surgery, she has impaired sensation in both of her arms and legs grossly, pain is in bil hands 7/10 that is intermittent, pain is pins and needles. No tingling in legs. Pt is able to stand up from chair without assistance after surgery (which she couldn't do prior to surgery). She is able to straighten her hands after surgery which she couldn't do before. Pt accompanied by: self  PERTINENT HISTORY: C4-6 fusion, hx of colon cancer for which she had surgery with resection of part of colon and part of her bladder.  PAIN:  Are you having pain? Yes: NPRS scale: 7/10 Pain location: bil hands Pain description: pins and needles Aggravating factors: "cold air" Relieving factors: non  PRECAUTIONS: Other: no lifting, bending, stopping, no lifting more than 5lbs.  WEIGHT BEARING RESTRICTIONS No  FALLS: Has patient fallen in last 6 months? Yes. Number of falls 2 falls prior to surgery due to legs giving away.  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  OBJECTIVE:   COGNITION: Overall cognitive status: Within functional limits for tasks assessed   SENSATION: Light touch: Impaired    LOWER EXTREMITY ROM:     Active  Right Eval Left Eval  Hip flexion    Hip extension    Hip abduction    Hip adduction    Hip internal rotation    Hip external rotation    Knee flexion    Knee extension    Ankle dorsiflexion    Ankle plantarflexion    Ankle inversion    Ankle eversion     (Blank rows = not tested)  LOWER EXTREMITY MMT:    MMT Right Eval Left Eval  Hip flexion    Hip extension    Hip abduction    Hip adduction    Hip internal rotation    Hip external rotation    Knee flexion    Knee extension    Ankle dorsiflexion    Ankle plantarflexion    Ankle inversion    Ankle eversion    (Blank rows = not tested)   GAIT: Gait pattern: decreased arm swing- Right, decreased arm swing- Left, decreased step length- Right, decreased step length- Left, decreased stance time- Right, decreased stance time- Left, decreased stride length, decreased ankle dorsiflexion- Right, decreased ankle dorsiflexion- Left, decreased trunk rotation, and wide BOS Distance walked: 100;' Assistive device utilized: Single point cane Level of assistance: CGA   FUNCTIONAL TESTs:  5 times sit to stand: 47 sec without using Hands 10 meter walk test: 0.48 m/s with cane Functional gait assessment: 5/30 with cane   TODAY'S TREATMENT:  Pt educated on nerve healing times and benefits of active stimulation with movements and exercises Pt educated on benefits of walking and given walking program with goal discussed to get to 30 min of walking/day Reviewed sit to stand with patient 10x   PATIENT  EDUCATION: Education details: see above Person educated: Patient Education method: Explanation Education comprehension: verbalized understanding   HOME EXERCISE PROGRAM: Walking Program: Walk with your family. Walk during cooler times of the day. Starting next week, Walk 5 min per day for 7 days. Following week add 5 minutes to your total time. Week 1 10 min Week 2: 15 min Week 3: 20 min Week 4: 25 min...Marland KitchenMarland KitchenUntil you can walk to 30 min per day   Practice sit to stand: 2 x 10 without use of your arms on sturdy chair (put chair against table or wall to prevent it from sliding).     GOALS: Goals reviewed with patient? Yes  SHORT TERM GOALS: Target date: 03/04/2022  Pt will be able to perform 5x sit to stand <30 seconds to improve functional strength without use of UE for support Baseline: 47 sec without use of UE support Goal status: INITIAL  2.  Pt will report compliance with walking program and report at least 15 min of walking with st. Cane or no AD to improve walking endurance Baseline: <5 min with cane (eval) Goal status: INITIAL  3.  Pt will be able to go up and down steps with reciprocal gait pattern with use of 1-2 UE support to improve functional strength in LE Baseline: step to pattern must use bil hand rail (eval) Goal status: INITIAL   LONG TERM GOALS: Target date: 04/01/2022  Pt will be able to perform 5x sit to stand <20 seconds without use of UE support to improve functionals strength with transfers.  Baseline: 47 sec without use of UE (eval) Goal status: INITIAL  2.  Pt will demo gait speed of >54ms with or without using st. Cane to improve functional ambulation and decrease fall risk Baseline: 0.48 m/s with st. Cane (eval) Goal status: INITIAL  3.  Pt will demo >20/30 on FGA to improve functional balance with walking. Baseline: 5/30 (eval) Goal status: INITIAL  4.  Pt will be able to ambulate for 30 min with or without cane to improve walking endurance  for community activities. Baseline: <5 min with cane (eval) Goal status: INITIAL  5.  Pt will demo 20 sec improvement on her Modified CTSIB to improve functional standing balance and proprioception. Baseline: TBD (eval) Goal status: INITIAL   ASSESSMENT:  CLINICAL IMPRESSION: Patient is a 51y.o. female who was seen today for physical therapy evaluation and treatment for gait abnormaility and weakness S/P C4-6 fusion and spinal stenosis.    OBJECTIVE IMPAIRMENTS Abnormal gait, decreased activity tolerance, decreased balance, decreased endurance, decreased mobility, difficulty walking, decreased ROM, decreased strength, hypomobility, increased fascial restrictions, increased muscle spasms, impaired flexibility, impaired UE functional use, improper body mechanics, postural dysfunction, and pain.   ACTIVITY LIMITATIONS carrying, lifting, bending, standing, squatting, stairs, and transfers  PARTICIPATION LIMITATIONS: meal prep, cleaning, laundry, driving, shopping, community activity, and yard work  PERSONAL FACTORS Past/current experiences, Time since onset of injury/illness/exacerbation, and 1 comorbidity: C4-6 fusion  are also affecting patient's functional outcome.   REHAB POTENTIAL: Good  CLINICAL DECISION MAKING: Stable/uncomplicated  EVALUATION COMPLEXITY: Low  PLAN: PT FREQUENCY: 2x/week  PT DURATION: 6 weeks  PLANNED INTERVENTIONS: Therapeutic exercises, Therapeutic activity, Neuromuscular re-education, Balance training, Gait training, Patient/Family education, Self Care, Joint mobilization, Stair training, Orthotic/Fit training, Dry Needling, Electrical stimulation, Spinal mobilization, Cryotherapy, Moist heat, Manual therapy, and Re-evaluation  PLAN FOR NEXT SESSION:  Perform modified CTSIB and edit goal Review compliance with walking program and answer any questions FGA: work on FGA components to improve gait Stairs: practice step ups on 6" and 8" boxes to improve  strength in bil LE Work on balance   KKerrie Pleasure PT 02/04/2022, 12:06 PM

## 2022-02-04 NOTE — Patient Instructions (Addendum)
Walking Program: Walk with your family. Walk during cooler times of the day. Starting next week, Walk 5 min per day for 7 days. Following week add 5 minutes to your total time. Week 1 10 min Week 2: 15 min Week 3: 20 min Week 4: 25 min...Marland KitchenMarland KitchenUntil you can walk to 30 min per day   Practice sit to stand: 2 x 10 without use of your arms on sturdy chair (put chair against table or wall to prevent it from sliding).

## 2022-02-11 ENCOUNTER — Encounter: Payer: Self-pay | Admitting: Physical Therapy

## 2022-02-11 ENCOUNTER — Ambulatory Visit: Payer: Managed Care, Other (non HMO) | Admitting: Physical Therapy

## 2022-02-11 DIAGNOSIS — R2689 Other abnormalities of gait and mobility: Secondary | ICD-10-CM | POA: Diagnosis not present

## 2022-02-11 DIAGNOSIS — M6281 Muscle weakness (generalized): Secondary | ICD-10-CM

## 2022-02-11 NOTE — Therapy (Signed)
OUTPATIENT PHYSICAL THERAPY NEURO EVALUATION   Patient Name: Amy Bentley MRN: [id number] DOB:1971/01/22, 51 y.o., female Today's Date: 02/11/2022   PCP: Thayer Ohm, PA REFERRING PROVIDER: Dawley, Theodoro Doing, DO    PT End of Session - 02/11/22 1149     Visit Number 2    Number of Visits 17    Date for PT Re-Evaluation 04/01/22    Authorization Type [employer]; $35 copay (Same day 1 copay)  No Ded  OPM $2500/$5000 - $1159 Met  VL: Unlimited    PT Start Time 1148    PT Stop Time 1226    PT Time Calculation (min) 38 min    Equipment Utilized During Treatment Gait belt;Cervical collar    Activity Tolerance Patient tolerated treatment well    Behavior During Therapy WFL for tasks assessed/performed             Past Medical History:  Diagnosis Date   colon ca dx'd 09/2013   colon   GERD (gastroesophageal reflux disease)    Hyperlipidemia    Morbid obesity ([place])    Past Surgical History:  Procedure Laterality Date   ANTERIOR CERVICAL DECOMP/DISCECTOMY FUSION N/A 01/30/2022   Procedure: Anterior Cervical Decompression/Discectomy Fusion Cervical Four-Five, Cervical Five-Six;  Surgeon: Karsten Ro, DO;  Location: [facility];  Service: Neurosurgery;  Laterality: N/A;  Anterior Cervical Decompression/Discectomy Fusion Cervical Four-Five, Cervical Five-Six   BREAST BIOPSY Right    stereo    CESAREAN SECTION     x 2   COLON RESECTION N/A 10/20/2013   Procedure: OPEN SIGMOID COLONECTOMY COLOVESICAL FISTULA REPAIR;  Surgeon: Stark Klein, MD;  [facility];  Service: General;  Laterality: N/A;   COLONOSCOPY  08/16/2014   COLONOSCOPY WITH PROPOFOL N/A 08/16/2014   Procedure: COLONOSCOPY WITH PROPOFOL;  Surgeon: Milus Banister, MD;  [facility];  Service: Endoscopy;  Laterality: N/A;   COLONOSCOPY WITH PROPOFOL N/A 12/23/2017   Procedure: COLONOSCOPY WITH PROPOFOL;  Surgeon: Milus Banister, MD;  [facility];  Service: Endoscopy;  Laterality: N/A;    CYSTECTOMY N/A 10/20/2013   Procedure: CYSTECTOMY PARTIAL;  Surgeon: Ardis Hughs, MD;  [facility];  Service: Urology;  Laterality: N/A;   HERNIA REPAIR     LAPAROSCOPIC GASTRIC SLEEVE RESECTION N/A 12/04/2019   Procedure: LAPAROSCOPIC GASTRIC SLEEVE RESECTION, Upper Endo, Eras Pathway;  Surgeon: Clovis Riley, MD;  [facility];  Service: General;  Laterality: N/A;   PORTACATH PLACEMENT N/A 11/15/2013   Procedure: INSERTION PORT-A-CATH;  Surgeon: Stark Klein, MD;  [facility];  Service: General;  Laterality: N/A;   TUBAL LIGATION     UPPER GI ENDOSCOPY N/A 12/04/2019   Procedure: UPPER GI ENDOSCOPY;  Surgeon: Clovis Riley, MD;  [facility];  Service: General;  Laterality: N/A;   VENTRAL HERNIA REPAIR N/A 07/21/2017   Procedure: EXPLORATORY LAPAROTOMY AND LYSIS OF ADHESIONS WITH  REPAIR OF VENTRAL HERNIA WITH MESH ;  Surgeon: Excell Seltzer, MD;  [facility];  Service: General;  Laterality: N/A;   Patient Active Problem List   Diagnosis Date Noted   S/P cervical discectomy 01/30/2022   Cervical myelopathy ([place]) 01/30/2022   Morbid obesity ([place])    History of colon cancer    S/P laparoscopic procedure 07/21/2017   Incisional hernia, reducible 07/16/2017   Nausea and vomiting 07/12/2017   Malignant neoplasm of sigmoid colon ([place]) 03/13/2014   Family history of malignant neoplasm of gastrointestinal tract 03/13/2014   Cancer of sigmoid colon,  invading bladder, T4N0, s/p en bloc resection 10/20/2013 11/06/2013   Protein-calorie malnutrition, severe ([place]) 10/19/2013   Iron deficiency anemia, multifactorial with GI/GU and menstrual losses contributory 10/18/2013   Diverticulitis of colon (without mention of hemorrhage)(562.11) 10/18/2013   Weight loss 10/17/2013   Altered mental status 08/07/2013   UTI (lower urinary tract infection) 08/07/2013   Hypokalemia 08/07/2013   Anemia 08/07/2013    ONSET DATE: 10/01/21  REFERRING DIAG: G95.9 (ICD-10-CM) -  Cervical myelopathy (HCC)   THERAPY DIAG:  Other abnormalities of gait and mobility  Muscle weakness (generalized)  Rationale for Evaluation and Treatment Rehabilitation  SUBJECTIVE:                                                                                                                                                                                              SUBJECTIVE STATEMENT: Pt reports she is having ongoing nerve pain in her hands that gets worse with activity. Pt reports overall her nerve pain is improved from before her surgery. Pt excited because she was able to cook for the first time this morning! Pt reports she has been doing her sit to stands and walking up to 5 min a day and it is going well.  Pt accompanied by: self  PERTINENT HISTORY: C4-6 fusion, hx of colon cancer for which she had surgery with resection of part of colon and part of her bladder.  PAIN:  Are you having pain? Yes: NPRS scale: 7/10 Pain location: bil hands Pain description: pins and needles Aggravating factors: "cold air" Relieving factors: none  PRECAUTIONS: Other: no lifting, bending, stopping, no lifting more than 5lbs.  WEIGHT BEARING RESTRICTIONS No  FALLS: Has patient fallen in last 6 months? Yes. Number of falls 2 falls prior to surgery due to legs giving away.  PATIENT GOALS get the feeling back, get the mobility back in my hands and feet.  OBJECTIVE:   TODAY'S TREATMENT:  THER ACT: Added to HEP, see bolded below  In // bars with Supervision and intermittent fingertip support: -tandem gait 3 x 10 ft -backwards gait 3 x 10 ft -alt L/R gum drop taps x 10 reps -alt L/R cross-over gum drop taps x 10 reps -alt L/R cone taps with difficulty with RLE control and coordination  Modified CTSIB: 107.3/120   PATIENT EDUCATION: Education details: added to HEP, continuation of HEP Person educated: Patient Education method: Explanation and Handouts Education comprehension:  verbalized understanding   HOME EXERCISE PROGRAM: Walking Program: Walk with your family. Walk during cooler times of the day. Starting next week, Walk 5 min per day for 7 days. Following week  add 5 minutes to your total time. Week 1 10 min Week 2: 15 min Week 3: 20 min Week 4: 25 min...Marland KitchenMarland KitchenUntil you can walk to 30 min per day   Practice sit to stand: 2 x 10 without use of your arms on sturdy chair (put chair against table or wall to prevent it from sliding).   Access Code: [email or web address] URL: [email or web address] Date: 02/11/2022 Prepared by: Excell Seltzer  Exercises - Forward Step Up with Counter Support  - 1 x daily - 4-5 x weekly - 3 sets - 10 reps - Lateral Step Up with Counter Support  - 1 x daily - 4-5 x weekly - 3 sets - 10 reps - Standing Tandem Balance with Counter Support  - 1 x daily - 7 x weekly - 2 sets - 5 reps - 30 sec hold  GOALS: Goals reviewed with patient? Yes  SHORT TERM GOALS: Target date: 03/04/2022  Pt will be able to perform 5x sit to stand <30 seconds to improve functional strength without use of UE for support Baseline: 47 sec without use of UE support Goal status: INITIAL  2.  Pt will report compliance with walking program and report at least 15 min of walking with st. Cane or no AD to improve walking endurance Baseline: <5 min with cane (eval) Goal status: INITIAL  3.  Pt will be able to go up and down steps with reciprocal gait pattern with use of 1-2 UE support to improve functional strength in LE Baseline: step to pattern must use bil hand rail (eval) Goal status: INITIAL   LONG TERM GOALS: Target date: 04/01/2022  Pt will be able to perform 5x sit to stand <20 seconds without use of UE support to improve functionals strength with transfers. Baseline: 47 sec without use of UE (eval) Goal status: INITIAL  2.  Pt will demo gait speed of >27ms with or without using st. Cane to improve functional ambulation and decrease fall  risk Baseline: 0.48 m/s with st. Cane (eval) Goal status: INITIAL  3.  Pt will demo >20/30 on FGA to improve functional balance with walking. Baseline: 5/30 (eval) Goal status: INITIAL  4.  Pt will be able to ambulate for 30 min with or without cane to improve walking endurance for community activities. Baseline: <5 min with cane (eval) Goal status: INITIAL  5.  Pt will demo 20 sec improvement on her Modified CTSIB to improve functional standing balance and proprioception. Baseline: 107.3/120 sec (8/16) Goal status: IN PROGRESS   ASSESSMENT:  CLINICAL IMPRESSION: Emphasis of skilled PT session on continuing to assess balance with CTSIB-M and update goals. Also added to HEP for LE strengthening/coordination and balance training. Pt exhibits decreased RLE strength and coordination through session with various balance and coordination tasks, continues to benefit from skilled therapy services to address deficits and decrease fall risk. Continue POC.   OBJECTIVE IMPAIRMENTS Abnormal gait, decreased activity tolerance, decreased balance, decreased endurance, decreased mobility, difficulty walking, decreased ROM, decreased strength, hypomobility, increased fascial restrictions, increased muscle spasms, impaired flexibility, impaired UE functional use, improper body mechanics, postural dysfunction, and pain.   ACTIVITY LIMITATIONS carrying, lifting, bending, standing, squatting, stairs, and transfers  PARTICIPATION LIMITATIONS: meal prep, cleaning, laundry, driving, shopping, community activity, and yard work  PERSONAL FACTORS Past/current experiences, Time since onset of injury/illness/exacerbation, and 1 comorbidity: C4-6 fusion  are also affecting patient's functional outcome.   REHAB POTENTIAL: Good  CLINICAL DECISION MAKING: Stable/uncomplicated  EVALUATION COMPLEXITY: Low  PLAN: PT FREQUENCY: 2x/week  PT DURATION: 6 weeks  PLANNED INTERVENTIONS: Therapeutic exercises,  Therapeutic activity, Neuromuscular re-education, Balance training, Gait training, Patient/Family education, Self Care, Joint mobilization, Stair training, Orthotic/Fit training, Dry Needling, Electrical stimulation, Spinal mobilization, Cryotherapy, Moist heat, Manual therapy, and Re-evaluation  PLAN FOR NEXT SESSION:  Review compliance with walking program and answer any questions FGA: work on FGA components to improve gait Work on Manufacturing systems engineer with RLE (cone taps, resisted step-taps)   Excell Seltzer, PT, DPT,CSRS 02/11/2022, 12:27 PM

## 2022-02-13 ENCOUNTER — Encounter: Payer: Self-pay | Admitting: Physical Therapy

## 2022-02-13 ENCOUNTER — Ambulatory Visit: Payer: Managed Care, Other (non HMO) | Admitting: Physical Therapy

## 2022-02-13 DIAGNOSIS — M6281 Muscle weakness (generalized): Secondary | ICD-10-CM

## 2022-02-13 DIAGNOSIS — R2689 Other abnormalities of gait and mobility: Secondary | ICD-10-CM

## 2022-02-13 NOTE — Therapy (Signed)
OUTPATIENT PHYSICAL THERAPY NEURO EVALUATION   Patient Name: Amy Bentley MRN: [id number] DOB:1970-10-24, 51 y.o., female Today's Date: 02/13/2022   PCP: Thayer Ohm, PA REFERRING PROVIDER: Dawley, Theodoro Doing, DO    PT End of Session - 02/13/22 1229     Visit Number 3    Number of Visits 17    Date for PT Re-Evaluation 04/01/22    Authorization Type [employer]; $35 copay (Same day 1 copay)  No Ded  OPM $2500/$5000 - $1159 Met  VL: Unlimited    PT Start Time 1228    PT Stop Time 1308    PT Time Calculation (min) 40 min    Equipment Utilized During Treatment Gait belt;Cervical collar    Activity Tolerance Patient tolerated treatment well    Behavior During Therapy WFL for tasks assessed/performed              Past Medical History:  Diagnosis Date   colon ca dx'd 09/2013   colon   GERD (gastroesophageal reflux disease)    Hyperlipidemia    Morbid obesity ([place])    Past Surgical History:  Procedure Laterality Date   ANTERIOR CERVICAL DECOMP/DISCECTOMY FUSION N/A 01/30/2022   Procedure: Anterior Cervical Decompression/Discectomy Fusion Cervical Four-Five, Cervical Five-Six;  Surgeon: Karsten Ro, DO;  Location: [facility];  Service: Neurosurgery;  Laterality: N/A;  Anterior Cervical Decompression/Discectomy Fusion Cervical Four-Five, Cervical Five-Six   BREAST BIOPSY Right    stereo    CESAREAN SECTION     x 2   COLON RESECTION N/A 10/20/2013   Procedure: OPEN SIGMOID COLONECTOMY COLOVESICAL FISTULA REPAIR;  Surgeon: Stark Klein, MD;  [facility];  Service: General;  Laterality: N/A;   COLONOSCOPY  08/16/2014   COLONOSCOPY WITH PROPOFOL N/A 08/16/2014   Procedure: COLONOSCOPY WITH PROPOFOL;  Surgeon: Milus Banister, MD;  [facility];  Service: Endoscopy;  Laterality: N/A;   COLONOSCOPY WITH PROPOFOL N/A 12/23/2017   Procedure: COLONOSCOPY WITH PROPOFOL;  Surgeon: Milus Banister, MD;  [facility];  Service: Endoscopy;  Laterality: N/A;    CYSTECTOMY N/A 10/20/2013   Procedure: CYSTECTOMY PARTIAL;  Surgeon: Ardis Hughs, MD;  [facility];  Service: Urology;  Laterality: N/A;   HERNIA REPAIR     LAPAROSCOPIC GASTRIC SLEEVE RESECTION N/A 12/04/2019   Procedure: LAPAROSCOPIC GASTRIC SLEEVE RESECTION, Upper Endo, Eras Pathway;  Surgeon: Clovis Riley, MD;  [facility];  Service: General;  Laterality: N/A;   PORTACATH PLACEMENT N/A 11/15/2013   Procedure: INSERTION PORT-A-CATH;  Surgeon: Stark Klein, MD;  [facility];  Service: General;  Laterality: N/A;   TUBAL LIGATION     UPPER GI ENDOSCOPY N/A 12/04/2019   Procedure: UPPER GI ENDOSCOPY;  Surgeon: Clovis Riley, MD;  [facility];  Service: General;  Laterality: N/A;   VENTRAL HERNIA REPAIR N/A 07/21/2017   Procedure: EXPLORATORY LAPAROTOMY AND LYSIS OF ADHESIONS WITH  REPAIR OF VENTRAL HERNIA WITH MESH ;  Surgeon: Excell Seltzer, MD;  [facility];  Service: General;  Laterality: N/A;   Patient Active Problem List   Diagnosis Date Noted   S/P cervical discectomy 01/30/2022   Cervical myelopathy ([place]) 01/30/2022   Morbid obesity ([place])    History of colon cancer    S/P laparoscopic procedure 07/21/2017   Incisional hernia, reducible 07/16/2017   Nausea and vomiting 07/12/2017   Malignant neoplasm of sigmoid colon ([place]) 03/13/2014   Family history of malignant neoplasm of gastrointestinal tract 03/13/2014   Cancer of sigmoid  colon, invading bladder, T4N0, s/p en bloc resection 10/20/2013 11/06/2013   Protein-calorie malnutrition, severe ([place]) 10/19/2013   Iron deficiency anemia, multifactorial with GI/GU and menstrual losses contributory 10/18/2013   Diverticulitis of colon (without mention of hemorrhage)(562.11) 10/18/2013   Weight loss 10/17/2013   Altered mental status 08/07/2013   UTI (lower urinary tract infection) 08/07/2013   Hypokalemia 08/07/2013   Anemia 08/07/2013    ONSET DATE: 10/01/21  REFERRING DIAG: G95.9 (ICD-10-CM) -  Cervical myelopathy (HCC)   THERAPY DIAG:  Other abnormalities of gait and mobility  Muscle weakness (generalized)  Rationale for Evaluation and Treatment Rehabilitation  SUBJECTIVE:                                                                                                                                                                                              SUBJECTIVE STATEMENT: Pt reports she saw her PCP this morning and that she is doing well. The nerve pain in her hands is increased this PM but she has been using her hands more. Pt reports she has been doing her HEP, no issues.  Pt accompanied by: self  PERTINENT HISTORY: C4-6 fusion, hx of colon cancer for which she had surgery with resection of part of colon and part of her bladder.  PAIN:  Are you having pain? Yes: NPRS scale: 7/10 Pain location: bil hands Pain description: pins and needles Aggravating factors: "cold air" Relieving factors: none  PRECAUTIONS: Other: no lifting, bending, stopping, no lifting more than 5lbs.  WEIGHT BEARING RESTRICTIONS No  FALLS: Has patient fallen in last 6 months? Yes. Number of falls 2 falls prior to surgery due to legs giving away.  PATIENT GOALS get the feeling back, get the mobility back in my hands and feet.  OBJECTIVE:   TODAY'S TREATMENT:  NMR:  At stairs: -alt L/R 6" step-taps with 2 UE support and CGA for balance -attempt 12" step-taps, only able to complete with LLE without compensations, x 10 reps with LLE -alt L/R 6" resisted step-taps with red theraband 2 x 10 reps with 2 UE support and CGA  At countertop: -resisted sidesteps L/R with red theraband (too easy, encouraged to use green theraband at home) -resisted monster walks with red theraband with one UE support  *added to HEP, see bolded below  At mat table with SPC: -alt L/R gum drop taps (out of 3 gum drops) with min A for balance, increased difficulty standing on RLE and performing taps with LLE,  3 x 15 reps with cues for LE and color of gum drop  In // bars: -standing mini-squats on airex 2 x  10 reps with CGA, focus on decreasing UE support   PATIENT EDUCATION: Education details: continue HEP Person educated: Patient Education method: Explanation and Handouts Education comprehension: verbalized understanding   HOME EXERCISE PROGRAM: Walking Program: Walk with your family. Walk during cooler times of the day. Starting next week, Walk 5 min per day for 7 days. Following week add 5 minutes to your total time. Week 1 10 min Week 2: 15 min Week 3: 20 min Week 4: 25 min...Marland KitchenMarland KitchenUntil you can walk to 30 min per day   Practice sit to stand: 2 x 10 without use of your arms on sturdy chair (put chair against table or wall to prevent it from sliding).   Access Code: [email or web address] URL: [email or web address] Date: 02/11/2022 Prepared by: Excell Seltzer  Exercises - Forward Step Up with Counter Support  - 1 x daily - 4-5 x weekly - 3 sets - 10 reps - Lateral Step Up with Counter Support  - 1 x daily - 4-5 x weekly - 3 sets - 10 reps - Standing Tandem Balance with Counter Support  - 1 x daily - 7 x weekly - 2 sets - 5 reps - 30 sec hold - Side Stepping with Resistance at Ankles and Counter Support  - 1 x daily - 7 x weekly - 1 sets - 5 reps - Forward Monster Walk with Resistance at Ankles and Counter Support  - 1 x daily - 7 x weekly - 1 sets - 5 reps  GOALS: Goals reviewed with patient? Yes  SHORT TERM GOALS: Target date: 03/04/2022  Pt will be able to perform 5x sit to stand <30 seconds to improve functional strength without use of UE for support Baseline: 47 sec without use of UE support Goal status: INITIAL  2.  Pt will report compliance with walking program and report at least 15 min of walking with st. Cane or no AD to improve walking endurance Baseline: <5 min with cane (eval) Goal status: INITIAL  3.  Pt will be able to go up and down steps with reciprocal  gait pattern with use of 1-2 UE support to improve functional strength in LE Baseline: step to pattern must use bil hand rail (eval) Goal status: INITIAL   LONG TERM GOALS: Target date: 04/01/2022  Pt will be able to perform 5x sit to stand <20 seconds without use of UE support to improve functionals strength with transfers. Baseline: 47 sec without use of UE (eval) Goal status: INITIAL  2.  Pt will demo gait speed of >55ms with or without using st. Cane to improve functional ambulation and decrease fall risk Baseline: 0.48 m/s with st. Cane (eval) Goal status: INITIAL  3.  Pt will demo >20/30 on FGA to improve functional balance with walking. Baseline: 5/30 (eval) Goal status: INITIAL  4.  Pt will be able to ambulate for 30 min with or without cane to improve walking endurance for community activities. Baseline: <5 min with cane (eval) Goal status: INITIAL  5.  Pt will demo 20 sec improvement on her Modified CTSIB to improve functional standing balance and proprioception. Baseline: 107.3/120 sec (8/16) Goal status: IN PROGRESS   ASSESSMENT:  CLINICAL IMPRESSION: Emphasis of skilled PT session on working on LE strengthening, neuromuscular reeducation, and coordination training. Added exercises to HEP, see bolded above. Pt fatigues with activities this session and requires seated rest breaks to recover, reports her LE feel "wobbly". Pt continues to exhibit decreased RLE strength and coordination with tasks. Continue POC.  OBJECTIVE IMPAIRMENTS Abnormal gait, decreased activity tolerance, decreased balance, decreased endurance, decreased mobility, difficulty walking, decreased ROM, decreased strength, hypomobility, increased fascial restrictions, increased muscle spasms, impaired flexibility, impaired UE functional use, improper body mechanics, postural dysfunction, and pain.   ACTIVITY LIMITATIONS carrying, lifting, bending, standing, squatting, stairs, and  transfers  PARTICIPATION LIMITATIONS: meal prep, cleaning, laundry, driving, shopping, community activity, and yard work  PERSONAL FACTORS Past/current experiences, Time since onset of injury/illness/exacerbation, and 1 comorbidity: C4-6 fusion  are also affecting patient's functional outcome.   REHAB POTENTIAL: Good  CLINICAL DECISION MAKING: Stable/uncomplicated  EVALUATION COMPLEXITY: Low  PLAN: PT FREQUENCY: 2x/week  PT DURATION: 6 weeks  PLANNED INTERVENTIONS: Therapeutic exercises, Therapeutic activity, Neuromuscular re-education, Balance training, Gait training, Patient/Family education, Self Care, Joint mobilization, Stair training, Orthotic/Fit training, Dry Needling, Electrical stimulation, Spinal mobilization, Cryotherapy, Moist heat, Manual therapy, and Re-evaluation  PLAN FOR NEXT SESSION:  Review compliance with walking program and answer any questions FGA: work on FGA components to improve gait Work on Manufacturing systems engineer with RLE (cone taps, resisted step-taps) Gait mechanics and addressing gait deviations    Excell Seltzer, PT, DPT,CSRS 02/13/2022, 1:09 PM

## 2022-02-18 ENCOUNTER — Ambulatory Visit: Payer: Managed Care, Other (non HMO)

## 2022-02-20 ENCOUNTER — Ambulatory Visit: Payer: Managed Care, Other (non HMO)

## 2022-02-20 DIAGNOSIS — M6281 Muscle weakness (generalized): Secondary | ICD-10-CM

## 2022-02-20 DIAGNOSIS — R2689 Other abnormalities of gait and mobility: Secondary | ICD-10-CM | POA: Diagnosis not present

## 2022-02-20 NOTE — Therapy (Signed)
OUTPATIENT PHYSICAL THERAPY NEURO EVALUATION   Patient Name: Amy Bentley MRN: [id number] DOB:07/10/1970, 51 y.o., female Today's Date: 02/20/2022   PCP: Thayer Ohm, PA REFERRING PROVIDER: Dawley, Theodoro Doing, DO    PT End of Session - 02/20/22 1236     Visit Number 4    Number of Visits 17    Date for PT Re-Evaluation 04/01/22    Authorization Type [employer]; $35 copay (Same day 1 copay)  No Ded  OPM $2500/$5000 - $1159 Met  VL: Unlimited    PT Start Time 1230    PT Stop Time 1315    PT Time Calculation (min) 45 min    Equipment Utilized During Treatment Gait belt;Cervical collar    Activity Tolerance Patient tolerated treatment well    Behavior During Therapy WFL for tasks assessed/performed              Past Medical History:  Diagnosis Date   colon ca dx'd 09/2013   colon   GERD (gastroesophageal reflux disease)    Hyperlipidemia    Morbid obesity ([place])    Past Surgical History:  Procedure Laterality Date   ANTERIOR CERVICAL DECOMP/DISCECTOMY FUSION N/A 01/30/2022   Procedure: Anterior Cervical Decompression/Discectomy Fusion Cervical Four-Five, Cervical Five-Six;  Surgeon: Karsten Ro, DO;  Location: [place];  Service: Neurosurgery;  Laterality: N/A;  Anterior Cervical Decompression/Discectomy Fusion Cervical Four-Five, Cervical Five-Six   BREAST BIOPSY Right    stereo    CESAREAN SECTION     x 2   COLON RESECTION N/A 10/20/2013   Procedure: OPEN SIGMOID COLONECTOMY COLOVESICAL FISTULA REPAIR;  Surgeon: Stark Klein, MD;  [facility];  Service: General;  Laterality: N/A;   COLONOSCOPY  08/16/2014   COLONOSCOPY WITH PROPOFOL N/A 08/16/2014   Procedure: COLONOSCOPY WITH PROPOFOL;  Surgeon: Milus Banister, MD;  [facility];  Service: Endoscopy;  Laterality: N/A;   COLONOSCOPY WITH PROPOFOL N/A 12/23/2017   Procedure: COLONOSCOPY WITH PROPOFOL;  Surgeon: Milus Banister, MD;  [facility];  Service: Endoscopy;  Laterality: N/A;    CYSTECTOMY N/A 10/20/2013   Procedure: CYSTECTOMY PARTIAL;  Surgeon: Ardis Hughs, MD;  [facility];  Service: Urology;  Laterality: N/A;   HERNIA REPAIR     LAPAROSCOPIC GASTRIC SLEEVE RESECTION N/A 12/04/2019   Procedure: LAPAROSCOPIC GASTRIC SLEEVE RESECTION, Upper Endo, Eras Pathway;  Surgeon: Clovis Riley, MD;  [facility];  Service: General;  Laterality: N/A;   PORTACATH PLACEMENT N/A 11/15/2013   Procedure: INSERTION PORT-A-CATH;  Surgeon: Stark Klein, MD;  [facility];  Service: General;  Laterality: N/A;   TUBAL LIGATION     UPPER GI ENDOSCOPY N/A 12/04/2019   Procedure: UPPER GI ENDOSCOPY;  Surgeon: Clovis Riley, MD;  [facility];  Service: General;  Laterality: N/A;   VENTRAL HERNIA REPAIR N/A 07/21/2017   Procedure: EXPLORATORY LAPAROTOMY AND LYSIS OF ADHESIONS WITH  REPAIR OF VENTRAL HERNIA WITH MESH ;  Surgeon: Excell Seltzer, MD;  [facility];  Service: General;  Laterality: N/A;   Patient Active Problem List   Diagnosis Date Noted   S/P cervical discectomy 01/30/2022   Cervical myelopathy ([place]) 01/30/2022   Morbid obesity ([place])    History of colon cancer    S/P laparoscopic procedure 07/21/2017   Incisional hernia, reducible 07/16/2017   Nausea and vomiting 07/12/2017   Malignant neoplasm of sigmoid colon ([place]) 03/13/2014   Family history of malignant neoplasm of gastrointestinal tract 03/13/2014   Cancer of sigmoid  colon, invading bladder, T4N0, s/p en bloc resection 10/20/2013 11/06/2013   Protein-calorie malnutrition, severe ([place]) 10/19/2013   Iron deficiency anemia, multifactorial with GI/GU and menstrual losses contributory 10/18/2013   Diverticulitis of colon (without mention of hemorrhage)(562.11) 10/18/2013   Weight loss 10/17/2013   Altered mental status 08/07/2013   UTI (lower urinary tract infection) 08/07/2013   Hypokalemia 08/07/2013   Anemia 08/07/2013    ONSET DATE: 10/01/21  REFERRING DIAG: G95.9 (ICD-10-CM) -  Cervical myelopathy (HCC)   THERAPY DIAG:  Other abnormalities of gait and mobility  Muscle weakness (generalized)  Rationale for Evaluation and Treatment Rehabilitation  SUBJECTIVE:                                                                                                                                                                                              SUBJECTIVE STATEMENT: Since Monday, I started having pain in L arm. I saw neurosurgeon on Wednesday and they said that it is normal. No changes in precautions. When I stretch my R leg, my R hand fingers curl and I have hard time straightening them.  Pt accompanied by: self  PERTINENT HISTORY: C4-6 fusion, hx of colon cancer for which she had surgery with resection of part of colon and part of her bladder.  PAIN:  Are you having pain? Yes: NPRS scale: 7-10/10 Pain location: bil hands Pain description: pins and needles Aggravating factors: "cold air" Relieving factors: none  PRECAUTIONS: Other: no lifting, bending, stopping, no lifting more than 5lbs.  WEIGHT BEARING RESTRICTIONS No  FALLS: Has patient fallen in last 6 months? Yes. Number of falls 2 falls prior to surgery due to legs giving away.  PATIENT GOALS get the feeling back, get the mobility back in my hands and feet.  OBJECTIVE:   TODAY'S TREATMENT:  Assessed pt's claim of stretching R leg and R fingers curling. Pt had difficulty straightening R fingers when stretching R leg vs when leg was bend in normal sitting at EOB  Assessed passive SLR in supine: R leg is at 45 deg vs L leg is at 85 deg with neural tension. Pt reported R foot going numb after assessing neural tension.  We worked on nerve flossing in R leg with following exercise: - pt in supine with R foot on ball: pt flexes knee by rolling ball in as she dorsiflexes and extends her toes and then straightens leg with curling toes and plantarflexing toe 3 x 20 times, also worked on passive SLR 50%  stretch range, and passive SLR with hip abducted and hip internal and external rotation. We were able to remove nerve  tension from leg and pt was feeling that in loewr back at end of the session and her SLR improved to about 85 deg on R leg at end of the session. Pt also was able to make a fist with R hand that she wasn't able to make since her surgery.  Scifit: level 1 for 10'  PATIENT EDUCATION: Education details: continue HEP Person educated: Patient Education method: Explanation and Handouts Education comprehension: verbalized understanding   HOME EXERCISE PROGRAM: Walking Program: Walk with your family. Walk during cooler times of the day. Starting next week, Walk 5 min per day for 7 days. Following week add 5 minutes to your total time. Week 1 10 min Week 2: 15 min Week 3: 20 min Week 4: 25 min...Marland KitchenMarland KitchenUntil you can walk to 30 min per day   Practice sit to stand: 2 x 10 without use of your arms on sturdy chair (put chair against table or wall to prevent it from sliding).   Access Code: [email or web address] URL: [email or web address] Date: 02/11/2022 Prepared by: Excell Seltzer  Exercises - Forward Step Up with Counter Support  - 1 x daily - 4-5 x weekly - 3 sets - 10 reps - Lateral Step Up with Counter Support  - 1 x daily - 4-5 x weekly - 3 sets - 10 reps - Standing Tandem Balance with Counter Support  - 1 x daily - 7 x weekly - 2 sets - 5 reps - 30 sec hold - Side Stepping with Resistance at Ankles and Counter Support  - 1 x daily - 7 x weekly - 1 sets - 5 reps - Forward Monster Walk with Resistance at Ankles and Counter Support  - 1 x daily - 7 x weekly - 1 sets - 5 reps  Nerve flossing exercises given with a ball. Drawn picture and gave to patient.   GOALS: Goals reviewed with patient? Yes  SHORT TERM GOALS: Target date: 03/04/2022  Pt will be able to perform 5x sit to stand <30 seconds to improve functional strength without use of UE for support Baseline: 47 sec  without use of UE support Goal status: INITIAL  2.  Pt will report compliance with walking program and report at least 15 min of walking with st. Cane or no AD to improve walking endurance Baseline: <5 min with cane (eval) Goal status: INITIAL  3.  Pt will be able to go up and down steps with reciprocal gait pattern with use of 1-2 UE support to improve functional strength in LE Baseline: step to pattern must use bil hand rail (eval) Goal status: INITIAL   LONG TERM GOALS: Target date: 04/01/2022  Pt will be able to perform 5x sit to stand <20 seconds without use of UE support to improve functionals strength with transfers. Baseline: 47 sec without use of UE (eval) Goal status: INITIAL  2.  Pt will demo gait speed of >37ms with or without using st. Cane to improve functional ambulation and decrease fall risk Baseline: 0.48 m/s with st. Cane (eval) Goal status: INITIAL  3.  Pt will demo >20/30 on FGA to improve functional balance with walking. Baseline: 5/30 (eval) Goal status: INITIAL  4.  Pt will be able to ambulate for 30 min with or without cane to improve walking endurance for community activities. Baseline: <5 min with cane (eval) Goal status: INITIAL  5.  Pt will demo 20 sec improvement on her Modified CTSIB to improve functional standing balance and proprioception. Baseline: 107.3/120 sec (8/16)  Goal status: IN PROGRESS   ASSESSMENT:  CLINICAL IMPRESSION: Pt had significant neural tension throughout R LE which improved with nerve flossing along with patient reporting improving stiffness in her R leg, improving stability in R leg, being able to make a fist in R hand, pt reported pain level decrease in R hand from 7-10/10 to 0/10 at end of the session.   OBJECTIVE IMPAIRMENTS Abnormal gait, decreased activity tolerance, decreased balance, decreased endurance, decreased mobility, difficulty walking, decreased ROM, decreased strength, hypomobility, increased fascial  restrictions, increased muscle spasms, impaired flexibility, impaired UE functional use, improper body mechanics, postural dysfunction, and pain.   ACTIVITY LIMITATIONS carrying, lifting, bending, standing, squatting, stairs, and transfers  PARTICIPATION LIMITATIONS: meal prep, cleaning, laundry, driving, shopping, community activity, and yard work  PERSONAL FACTORS Past/current experiences, Time since onset of injury/illness/exacerbation, and 1 comorbidity: C4-6 fusion  are also affecting patient's functional outcome.   REHAB POTENTIAL: Good  CLINICAL DECISION MAKING: Stable/uncomplicated  EVALUATION COMPLEXITY: Low  PLAN: PT FREQUENCY: 2x/week  PT DURATION: 6 weeks  PLANNED INTERVENTIONS: Therapeutic exercises, Therapeutic activity, Neuromuscular re-education, Balance training, Gait training, Patient/Family education, Self Care, Joint mobilization, Stair training, Orthotic/Fit training, Dry Needling, Electrical stimulation, Spinal mobilization, Cryotherapy, Moist heat, Manual therapy, and Re-evaluation  PLAN FOR NEXT SESSION:  Review compliance with walking program and answer any questions FGA: work on FGA components to improve gait Work on Manufacturing systems engineer with RLE (cone taps, resisted step-taps) Gait mechanics and addressing gait deviations    Kerrie Pleasure, PT 02/20/2022, 1:16 PM

## 2022-02-25 ENCOUNTER — Ambulatory Visit: Payer: Managed Care, Other (non HMO) | Admitting: Physical Therapy

## 2022-02-25 DIAGNOSIS — M6281 Muscle weakness (generalized): Secondary | ICD-10-CM

## 2022-02-25 DIAGNOSIS — R2689 Other abnormalities of gait and mobility: Secondary | ICD-10-CM | POA: Diagnosis not present

## 2022-02-25 NOTE — Therapy (Signed)
OUTPATIENT PHYSICAL THERAPY NEURO EVALUATION   Patient Name: Ladrea Holladay MRN: [id number] DOB:02/08/1971, 51 y.o., female Today's Date: 02/25/2022   PCP: Thayer Ohm, PA REFERRING PROVIDER: Dawley, Theodoro Doing, DO    PT End of Session - 02/25/22 1319     Visit Number 5    Number of Visits 17    Date for PT Re-Evaluation 04/01/22    Authorization Type [employer]; $35 copay (Same day 1 copay)  No Ded  OPM $2500/$5000 - $1159 Met  VL: Unlimited    PT Start Time 1315    PT Stop Time 1355    PT Time Calculation (min) 40 min    Equipment Utilized During Treatment Gait belt;Cervical collar    Activity Tolerance Patient tolerated treatment well    Behavior During Therapy WFL for tasks assessed/performed               Past Medical History:  Diagnosis Date   colon ca dx'd 09/2013   colon   GERD (gastroesophageal reflux disease)    Hyperlipidemia    Morbid obesity ([place])    Past Surgical History:  Procedure Laterality Date   ANTERIOR CERVICAL DECOMP/DISCECTOMY FUSION N/A 01/30/2022   Procedure: Anterior Cervical Decompression/Discectomy Fusion Cervical Four-Five, Cervical Five-Six;  Surgeon: Karsten Ro, DO;  Location: [facility];  Service: Neurosurgery;  Laterality: N/A;  Anterior Cervical Decompression/Discectomy Fusion Cervical Four-Five, Cervical Five-Six   BREAST BIOPSY Right    stereo    CESAREAN SECTION     x 2   COLON RESECTION N/A 10/20/2013   Procedure: OPEN SIGMOID COLONECTOMY COLOVESICAL FISTULA REPAIR;  Surgeon: Stark Klein, MD;  [facility];  Service: General;  Laterality: N/A;   COLONOSCOPY  08/16/2014   COLONOSCOPY WITH PROPOFOL N/A 08/16/2014   Procedure: COLONOSCOPY WITH PROPOFOL;  Surgeon: Milus Banister, MD;  [facility];  Service: Endoscopy;  Laterality: N/A;   COLONOSCOPY WITH PROPOFOL N/A 12/23/2017   Procedure: COLONOSCOPY WITH PROPOFOL;  Surgeon: Milus Banister, MD;  [facility];  Service: Endoscopy;  Laterality: N/A;    CYSTECTOMY N/A 10/20/2013   Procedure: CYSTECTOMY PARTIAL;  Surgeon: Ardis Hughs, MD;  [facility];  Service: Urology;  Laterality: N/A;   HERNIA REPAIR     LAPAROSCOPIC GASTRIC SLEEVE RESECTION N/A 12/04/2019   Procedure: LAPAROSCOPIC GASTRIC SLEEVE RESECTION, Upper Endo, Eras Pathway;  Surgeon: Clovis Riley, MD;  [facility];  Service: General;  Laterality: N/A;   PORTACATH PLACEMENT N/A 11/15/2013   Procedure: INSERTION PORT-A-CATH;  Surgeon: Stark Klein, MD;  [facility];  Service: General;  Laterality: N/A;   TUBAL LIGATION     UPPER GI ENDOSCOPY N/A 12/04/2019   Procedure: UPPER GI ENDOSCOPY;  Surgeon: Clovis Riley, MD;  [facility];  Service: General;  Laterality: N/A;   VENTRAL HERNIA REPAIR N/A 07/21/2017   Procedure: EXPLORATORY LAPAROTOMY AND LYSIS OF ADHESIONS WITH  REPAIR OF VENTRAL HERNIA WITH MESH ;  Surgeon: Excell Seltzer, MD;  [facility];  Service: General;  Laterality: N/A;   Patient Active Problem List   Diagnosis Date Noted   S/P cervical discectomy 01/30/2022   Cervical myelopathy ([place]) 01/30/2022   Morbid obesity ([place])    History of colon cancer    S/P laparoscopic procedure 07/21/2017   Incisional hernia, reducible 07/16/2017   Nausea and vomiting 07/12/2017   Malignant neoplasm of sigmoid colon ([place]) 03/13/2014   Family history of malignant neoplasm of gastrointestinal tract 03/13/2014   Cancer of  sigmoid colon, invading bladder, T4N0, s/p en bloc resection 10/20/2013 11/06/2013   Protein-calorie malnutrition, severe ([place]) 10/19/2013   Iron deficiency anemia, multifactorial with GI/GU and menstrual losses contributory 10/18/2013   Diverticulitis of colon (without mention of hemorrhage)(562.11) 10/18/2013   Weight loss 10/17/2013   Altered mental status 08/07/2013   UTI (lower urinary tract infection) 08/07/2013   Hypokalemia 08/07/2013   Anemia 08/07/2013    ONSET DATE: 10/01/21  REFERRING DIAG: G95.9 (ICD-10-CM) -  Cervical myelopathy ([facility])   THERAPY DIAG:  Other abnormalities of gait and mobility  Muscle weakness (generalized)  Rationale for Evaluation and Treatment Rehabilitation  SUBJECTIVE:                                                                                                                                                                                              SUBJECTIVE STATEMENT: Pt reports she has been walking without her SPC at home. Pt brings her SPC into the clinic today and reports she uses it when out in the community just in case for balance. Pt reports her HEP has been doing well at home. Pt reports she has a follow-up appointment with her surgeon 9/14, will ask about cervical precautions. Hard collar can come off 9/4.  Pt accompanied by: self  PERTINENT HISTORY: C4-6 fusion, hx of colon cancer for which she had surgery with resection of part of colon and part of her bladder.  PAIN:  Are you having pain? Yes: NPRS scale: 7-10/10 Pain location: bil hands Pain description: pins and needles Aggravating factors: "cold air" Relieving factors: none  PRECAUTIONS: Other: no lifting, bending, stopping, no lifting more than 5lbs.  WEIGHT BEARING RESTRICTIONS No  FALLS: Has patient fallen in last 6 months? Yes. Number of falls 2 falls prior to surgery due to legs giving away.  PATIENT GOALS get the feeling back, get the mobility back in my hands and feet.  OBJECTIVE:   TODAY'S TREATMENT:  NMR: Seated ulnar nerve flossing, felt on LUE but not RUE Seated median nerve glide, felt on RUE but  not LUE  Added to HEP, see bolded below  Backwards gait no AD 4 x 20 ft with CGA -pt has one LOB and needs min A to recover -pt exhibits decreased speed, narow BOS  Gait with alt L/R gumdrop taps with CGA for balance  Standing alt L/R walking stick step-overs with min HHA -2 x 10 reps forwards -2 x 10 reps laterally (increased difficulty as compared to forward  step-overs)   GAIT: Gait pattern: step through pattern, decreased hip/knee flexion- Right, decreased ankle dorsiflexion- Right, and circumduction- Right Distance  walked: 230 ft Assistive device utilized: None Level of assistance: SBA Comments: Trial gait with no AD in the clinic. Pt exhibits some path deivation and one LOB that occurs when turing a corner, able to correct with min A. Pt initially with increased gait speed but decreases speed following LOB.    PATIENT EDUCATION: Education details: continue HEP, added UE nerve glides Person educated: Patient Education method: Explanation and Handouts Education comprehension: verbalized understanding   HOME EXERCISE PROGRAM: Walking Program: Walk with your family. Walk during cooler times of the day. Starting next week, Walk 5 min per day for 7 days. Following week add 5 minutes to your total time. Week 1 10 min Week 2: 15 min Week 3: 20 min Week 4: 25 min...Marland KitchenMarland KitchenUntil you can walk to 30 min per day   Practice sit to stand: 2 x 10 without use of your arms on sturdy chair (put chair against table or wall to prevent it from sliding).   Access Code: [email or web address] URL: [email or web address] Date: 02/11/2022 Prepared by: Excell Seltzer  Exercises - Forward Step Up with Counter Support  - 1 x daily - 4-5 x weekly - 3 sets - 10 reps - Lateral Step Up with Counter Support  - 1 x daily - 4-5 x weekly - 3 sets - 10 reps - Standing Tandem Balance with Counter Support  - 1 x daily - 7 x weekly - 2 sets - 5 reps - 30 sec hold - Side Stepping with Resistance at Ankles and Counter Support  - 1 x daily - 7 x weekly - 1 sets - 5 reps - Forward Monster Walk with Resistance at Ankles and Counter Support  - 1 x daily - 7 x weekly - 1 sets - 5 reps - Standing Median Nerve Glide  - 1 x daily - 7 x weekly - 1 sets - 5-10 reps - 10 sec hold - Standing Ulnar Nerve Glide  - 1 x daily - 7 x weekly - 1 sets - 5-10 reps - 10 sec hold  Nerve  flossing exercises given with a ball. Drawn picture and gave to patient.   GOALS: Goals reviewed with patient? Yes  SHORT TERM GOALS: Target date: 03/04/2022  Pt will be able to perform 5x sit to stand <30 seconds to improve functional strength without use of UE for support Baseline: 47 sec without use of UE support Goal status: INITIAL  2.  Pt will report compliance with walking program and report at least 15 min of walking with st. Cane or no AD to improve walking endurance Baseline: <5 min with cane (eval) Goal status: INITIAL  3.  Pt will be able to go up and down steps with reciprocal gait pattern with use of 1-2 UE support to improve functional strength in LE Baseline: step to pattern must use bil hand rail (eval) Goal status: INITIAL   LONG TERM GOALS: Target date: 04/01/2022  Pt will be able to perform 5x sit to stand <20 seconds without use of UE support to improve functionals strength with transfers. Baseline: 47 sec without use of UE (eval) Goal status: INITIAL  2.  Pt will demo gait speed of >59ms with or without using st. Cane to improve functional ambulation and decrease fall risk Baseline: 0.48 m/s with st. Cane (eval) Goal status: INITIAL  3.  Pt will demo >20/30 on FGA to improve functional balance with walking. Baseline: 5/30 (eval) Goal status: INITIAL  4.  Pt will be able to ambulate  for 30 min with or without cane to improve walking endurance for community activities. Baseline: <5 min with cane (eval) Goal status: INITIAL  5.  Pt will demo 20 sec improvement on her Modified CTSIB to improve functional standing balance and proprioception. Baseline: 107.3/120 sec (8/16) Goal status: IN PROGRESS   ASSESSMENT:  CLINICAL IMPRESSION: Emphasis of skilled PT session on working on UE nerve glides to address ongoing nerve pain and working on LE coordination and balance training. Pt continues to exhibit RLE>LLE weakness and decreased coordination and control of  limb. Pt continues to benefit from skilled therapy services to address ongoing gait and balance impairments. Continue POC.    OBJECTIVE IMPAIRMENTS Abnormal gait, decreased activity tolerance, decreased balance, decreased endurance, decreased mobility, difficulty walking, decreased ROM, decreased strength, hypomobility, increased fascial restrictions, increased muscle spasms, impaired flexibility, impaired UE functional use, improper body mechanics, postural dysfunction, and pain.   ACTIVITY LIMITATIONS carrying, lifting, bending, standing, squatting, stairs, and transfers  PARTICIPATION LIMITATIONS: meal prep, cleaning, laundry, driving, shopping, community activity, and yard work  PERSONAL FACTORS Past/current experiences, Time since onset of injury/illness/exacerbation, and 1 comorbidity: C4-6 fusion  are also affecting patient's functional outcome.   REHAB POTENTIAL: Good  CLINICAL DECISION MAKING: Stable/uncomplicated  EVALUATION COMPLEXITY: Low  PLAN: PT FREQUENCY: 2x/week  PT DURATION: 6 weeks  PLANNED INTERVENTIONS: Therapeutic exercises, Therapeutic activity, Neuromuscular re-education, Balance training, Gait training, Patient/Family education, Self Care, Joint mobilization, Stair training, Orthotic/Fit training, Dry Needling, Electrical stimulation, Spinal mobilization, Cryotherapy, Moist heat, Manual therapy, and Re-evaluation  PLAN FOR NEXT SESSION:  Review compliance with walking program and answer any questions FGA: work on FGA components to improve gait Work on Manufacturing systems engineer with RLE (cone taps, resisted step-taps) Gait mechanics and addressing gait deviations Ladder drills, 4 square stepping, seated or standing ball kicks    Excell Seltzer, PT, DPT, CSRS 02/25/2022, 1:57 PM

## 2022-02-27 ENCOUNTER — Ambulatory Visit: Payer: Managed Care, Other (non HMO) | Attending: Neurological Surgery | Admitting: Physical Therapy

## 2022-02-27 DIAGNOSIS — R2681 Unsteadiness on feet: Secondary | ICD-10-CM | POA: Insufficient documentation

## 2022-02-27 DIAGNOSIS — R208 Other disturbances of skin sensation: Secondary | ICD-10-CM | POA: Insufficient documentation

## 2022-02-27 DIAGNOSIS — R600 Localized edema: Secondary | ICD-10-CM | POA: Insufficient documentation

## 2022-02-27 DIAGNOSIS — R278 Other lack of coordination: Secondary | ICD-10-CM | POA: Insufficient documentation

## 2022-02-27 DIAGNOSIS — M6281 Muscle weakness (generalized): Secondary | ICD-10-CM | POA: Diagnosis present

## 2022-02-27 DIAGNOSIS — M25642 Stiffness of left hand, not elsewhere classified: Secondary | ICD-10-CM | POA: Insufficient documentation

## 2022-02-27 DIAGNOSIS — R2689 Other abnormalities of gait and mobility: Secondary | ICD-10-CM | POA: Diagnosis present

## 2022-02-27 NOTE — Therapy (Signed)
OUTPATIENT PHYSICAL THERAPY NEURO TREATMENT   Patient Name: Amy Bentley MRN: [id number] DOB:1971/06/15, 51 y.o., female Today's Date: 02/27/2022   PCP: Thayer Ohm, PA REFERRING PROVIDER: Dawley, Theodoro Doing, DO    PT End of Session - 02/27/22 1233     Visit Number 6    Number of Visits 17    Date for PT Re-Evaluation 04/01/22    Authorization Type [employer]; $35 copay (Same day 1 copay)  No Ded  OPM $2500/$5000 - $1159 Met  VL: Unlimited    PT Start Time 1231    PT Stop Time 1312    PT Time Calculation (min) 41 min    Equipment Utilized During Treatment Gait belt;Cervical collar    Activity Tolerance Patient tolerated treatment well    Behavior During Therapy WFL for tasks assessed/performed                Past Medical History:  Diagnosis Date   colon ca dx'd 09/2013   colon   GERD (gastroesophageal reflux disease)    Hyperlipidemia    Morbid obesity ([place])    Past Surgical History:  Procedure Laterality Date   ANTERIOR CERVICAL DECOMP/DISCECTOMY FUSION N/A 01/30/2022   Procedure: Anterior Cervical Decompression/Discectomy Fusion Cervical Four-Five, Cervical Five-Six;  Surgeon: Karsten Ro, DO;  Location: [facility];  Service: Neurosurgery;  Laterality: N/A;  Anterior Cervical Decompression/Discectomy Fusion Cervical Four-Five, Cervical Five-Six   BREAST BIOPSY Right    stereo    CESAREAN SECTION     x 2   COLON RESECTION N/A 10/20/2013   Procedure: OPEN SIGMOID COLONECTOMY COLOVESICAL FISTULA REPAIR;  Surgeon: Stark Klein, MD;  Location: [facility];  Service: General;  Laterality: N/A;   COLONOSCOPY  08/16/2014   COLONOSCOPY WITH PROPOFOL N/A 08/16/2014   Procedure: COLONOSCOPY WITH PROPOFOL;  Surgeon: Milus Banister, MD;  Location: [facility];  Service: Endoscopy;  Laterality: N/A;   COLONOSCOPY WITH PROPOFOL N/A 12/23/2017   Procedure: COLONOSCOPY WITH PROPOFOL;  Surgeon: Milus Banister, MD;  Location: [facility];  Service: Endoscopy;  Laterality: N/A;    CYSTECTOMY N/A 10/20/2013   Procedure: CYSTECTOMY PARTIAL;  Surgeon: Ardis Hughs, MD;  Location: [facility];  Service: Urology;  Laterality: N/A;   HERNIA REPAIR     LAPAROSCOPIC GASTRIC SLEEVE RESECTION N/A 12/04/2019   Procedure: LAPAROSCOPIC GASTRIC SLEEVE RESECTION, Upper Endo, Eras Pathway;  Surgeon: Clovis Riley, MD;  Location: [facility];  Service: General;  Laterality: N/A;   PORTACATH PLACEMENT N/A 11/15/2013   Procedure: INSERTION PORT-A-CATH;  Surgeon: Stark Klein, MD;  Location: [facility];  Service: General;  Laterality: N/A;   TUBAL LIGATION     UPPER GI ENDOSCOPY N/A 12/04/2019   Procedure: UPPER GI ENDOSCOPY;  Surgeon: Clovis Riley, MD;  Location: [facility];  Service: General;  Laterality: N/A;   VENTRAL HERNIA REPAIR N/A 07/21/2017   Procedure: EXPLORATORY LAPAROTOMY AND LYSIS OF ADHESIONS WITH  REPAIR OF VENTRAL HERNIA WITH MESH ;  Surgeon: Excell Seltzer, MD;  Location: [facility];  Service: General;  Laterality: N/A;   Patient Active Problem List   Diagnosis Date Noted   S/P cervical discectomy 01/30/2022   Cervical myelopathy ([place]) 01/30/2022   Morbid obesity ([place])    History of colon cancer    S/P laparoscopic procedure 07/21/2017   Incisional hernia, reducible 07/16/2017   Nausea and vomiting 07/12/2017   Malignant neoplasm of sigmoid colon ([place]) 03/13/2014   Family history of malignant neoplasm of gastrointestinal tract 03/13/2014   Cancer  of sigmoid colon, invading bladder, T4N0, s/p en bloc resection 10/20/2013 11/06/2013   Protein-calorie malnutrition, severe ([place]) 10/19/2013   Iron deficiency anemia, multifactorial with GI/GU and menstrual losses contributory 10/18/2013   Diverticulitis of colon (without mention of hemorrhage)(562.11) 10/18/2013   Weight loss 10/17/2013   Altered mental status 08/07/2013   UTI (lower urinary tract infection) 08/07/2013   Hypokalemia 08/07/2013   Anemia 08/07/2013    ONSET DATE: 10/01/21  REFERRING DIAG: G95.9 (ICD-10-CM) -  Cervical myelopathy ([facility])   THERAPY DIAG:  Other abnormalities of gait and mobility  Muscle weakness (generalized)  Rationale for Evaluation and Treatment Rehabilitation  SUBJECTIVE:                                                                                                                                                                                              SUBJECTIVE STATEMENT: Pt reports her pain was pretty bad yesterday after doing UE nerve glides, washing her hair, making breakfast, 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 Pt reports her pain has improved today.  Pt accompanied by: self  PERTINENT HISTORY: C4-6 fusion, hx of colon cancer for which she had surgery with resection of part of colon and part of her bladder.  PAIN:  Are you having pain? Yes: NPRS scale: 5/10 Pain location: bil hands Pain description: pins and needles Aggravating factors: "cold air" Relieving factors: none  PRECAUTIONS: Other: no lifting, bending, stopping, no lifting more than 5lbs.  WEIGHT BEARING RESTRICTIONS No  FALLS: Has patient fallen in last 6 months? Yes. Number of falls 2 falls prior to surgery due to legs giving away.  PATIENT GOALS get the feeling back, get the mobility back in my hands and feet.  OBJECTIVE:   TODAY'S TREATMENT:  NMR: Seated alt LE ball kicks with progression to standing alt ball kicks Standing alt LE ball stop and kick with one UE support on back of chair and CGA for balance -unable to maintain balance without UE support  GAIT: Ambulation x 230 ft around therapy gym with min A for balance with focus on quick changes in gait speed from "fast", "slow", and "normal" speeds, occasional LOB with turns. Minimal detectable difference between "slow" and "normal" speeds.  THER ACT: -Agility ladder forwards step-to with progression to step-through with CGA overall, several LOB with min A to recover -Agility ladder sidestep with min A for  balance, several LOB with min A to recover  4 square-stepping, min A for balance and HHA needed for backwards stepping -added in mirror for improved visualization of obstacles due to difficulty looking down with hard collar and cervical  precautions, pt exhibits increased speed of task performance and increased clearance of obstacles with use of mirror   PATIENT EDUCATION: Education details: continue HEP, assess pain after nerve glides Person educated: Patient Education method: Explanation and Handouts Education comprehension: verbalized understanding   HOME EXERCISE PROGRAM: Walking Program: Walk with your family. Walk during cooler times of the day. Starting next week, Walk 5 min per day for 7 days. Following week add 5 minutes to your total time. Week 1 10 min Week 2: 15 min Week 3: 20 min Week 4: 25 min...Marland KitchenMarland KitchenUntil you can walk to 30 min per day   Practice sit to stand: 2 x 10 without use of your arms on sturdy chair (put chair against table or wall to prevent it from sliding).   Access Code: [email or web address] URL: [email or web address] Date: 02/11/2022 Prepared by: Excell Seltzer  Exercises - Forward Step Up with Counter Support  - 1 x daily - 4-5 x weekly - 3 sets - 10 reps - Lateral Step Up with Counter Support  - 1 x daily - 4-5 x weekly - 3 sets - 10 reps - Standing Tandem Balance with Counter Support  - 1 x daily - 7 x weekly - 2 sets - 5 reps - 30 sec hold - Side Stepping with Resistance at Ankles and Counter Support  - 1 x daily - 7 x weekly - 1 sets - 5 reps - Forward Monster Walk with Resistance at Ankles and Counter Support  - 1 x daily - 7 x weekly - 1 sets - 5 reps - Standing Median Nerve Glide  - 1 x daily - 7 x weekly - 1 sets - 5-10 reps - 10 sec hold - Standing Ulnar Nerve Glide  - 1 x daily - 7 x weekly - 1 sets - 5-10 reps - 10 sec hold  Nerve flossing exercises given with a ball. Drawn picture and gave to patient.   GOALS: Goals reviewed with  patient? Yes  SHORT TERM GOALS: Target date: 03/04/2022  Pt will be able to perform 5x sit to stand <30 seconds to improve functional strength without use of UE for support Baseline: 47 sec without use of UE support Goal status: INITIAL  2.  Pt will report compliance with walking program and report at least 15 min of walking with st. Cane or no AD to improve walking endurance Baseline: <5 min with cane (eval) Goal status: INITIAL  3.  Pt will be able to go up and down steps with reciprocal gait pattern with use of 1-2 UE support to improve functional strength in LE Baseline: step to pattern must use bil hand rail (eval) Goal status: INITIAL   LONG TERM GOALS: Target date: 04/01/2022  Pt will be able to perform 5x sit to stand <20 seconds without use of UE support to improve functionals strength with transfers. Baseline: 47 sec without use of UE (eval) Goal status: INITIAL  2.  Pt will demo gait speed of >27ms with or without using st. Cane to improve functional ambulation and decrease fall risk Baseline: 0.48 m/s with st. Cane (eval) Goal status: INITIAL  3.  Pt will demo >20/30 on FGA to improve functional balance with walking. Baseline: 5/30 (eval) Goal status: INITIAL  4.  Pt will be able to ambulate for 30 min with or without cane to improve walking endurance for community activities. Baseline: <5 min with cane (eval) Goal status: INITIAL  5.  Pt will demo 20 sec improvement on her  Modified CTSIB to improve functional standing balance and proprioception. Baseline: 107.3/120 sec (8/16) Goal status: IN PROGRESS   ASSESSMENT:  CLINICAL IMPRESSION: Emphasis of skilled PT session on working on higher-level balance tasks, gait mechanics, and gait with variable speeds. Pt continues to exhibit difficulty maintaining her balance when in SLS and continues to benefit from skilled therapy services to address balance and strength deficits. Educated pt to monitor her pain symptoms after  performing UE nerve glides and if they continue to increase her pain then to stop performing them. Continue POC.   OBJECTIVE IMPAIRMENTS Abnormal gait, decreased activity tolerance, decreased balance, decreased endurance, decreased mobility, difficulty walking, decreased ROM, decreased strength, hypomobility, increased fascial restrictions, increased muscle spasms, impaired flexibility, impaired UE functional use, improper body mechanics, postural dysfunction, and pain.   ACTIVITY LIMITATIONS carrying, lifting, bending, standing, squatting, stairs, and transfers  PARTICIPATION LIMITATIONS: meal prep, cleaning, laundry, driving, shopping, community activity, and yard work  PERSONAL FACTORS Past/current experiences, Time since onset of injury/illness/exacerbation, and 1 comorbidity: C4-6 fusion  are also affecting patient's functional outcome.   REHAB POTENTIAL: Good  CLINICAL DECISION MAKING: Stable/uncomplicated  EVALUATION COMPLEXITY: Low  PLAN: PT FREQUENCY: 2x/week  PT DURATION: 6 weeks  PLANNED INTERVENTIONS: Therapeutic exercises, Therapeutic activity, Neuromuscular re-education, Balance training, Gait training, Patient/Family education, Self Care, Joint mobilization, Stair training, Orthotic/Fit training, Dry Needling, Electrical stimulation, Spinal mobilization, Cryotherapy, Moist heat, Manual therapy, and Re-evaluation  PLAN FOR NEXT SESSION:  Review compliance with walking program and answer any questions FGA: work on FGA components to improve gait Work on Manufacturing systems engineer with RLE (cone taps, resisted step-taps) Gait mechanics and addressing gait deviations Ladder drills, 4 square stepping, standing ball kicks Assess STG next session    Excell Seltzer, PT, DPT, CSRS 02/27/2022, 1:14 PM

## 2022-03-04 ENCOUNTER — Ambulatory Visit: Payer: Managed Care, Other (non HMO) | Admitting: Physical Therapy

## 2022-03-06 ENCOUNTER — Ambulatory Visit: Payer: Managed Care, Other (non HMO)

## 2022-03-06 DIAGNOSIS — M6281 Muscle weakness (generalized): Secondary | ICD-10-CM

## 2022-03-06 DIAGNOSIS — R2689 Other abnormalities of gait and mobility: Secondary | ICD-10-CM

## 2022-03-06 NOTE — Therapy (Signed)
OUTPATIENT PHYSICAL THERAPY NEURO TREATMENT   Patient Name: Joliene Salvador MRN: [id number] DOB:January 14, 1971, 51 y.o., female Today's Date: 03/06/2022   PCP: Thayer Ohm, PA REFERRING PROVIDER: Dawley, Theodoro Doing, DO    PT End of Session - 03/06/22 1212     Visit Number 7    Number of Visits 17    Date for PT Re-Evaluation 04/01/22    Authorization Type [employer]; $35 copay (Same day 1 copay)  No Ded  OPM $2500/$5000 - $1159 Met  VL: Unlimited    PT Start Time 1230    PT Stop Time 1312    PT Time Calculation (min) 42 min    Equipment Utilized During Treatment Gait belt    Activity Tolerance Patient tolerated treatment well    Behavior During Therapy WFL for tasks assessed/performed                Past Medical History:  Diagnosis Date   colon ca dx'd 09/2013   colon   GERD (gastroesophageal reflux disease)    Hyperlipidemia    Morbid obesity ([place])    Past Surgical History:  Procedure Laterality Date   ANTERIOR CERVICAL DECOMP/DISCECTOMY FUSION N/A 01/30/2022   Procedure: Anterior Cervical Decompression/Discectomy Fusion Cervical Four-Five, Cervical Five-Six;  Surgeon: Karsten Ro, DO;  Location: [facility];  Service: Neurosurgery;  Laterality: N/A;  Anterior Cervical Decompression/Discectomy Fusion Cervical Four-Five, Cervical Five-Six   BREAST BIOPSY Right    stereo    CESAREAN SECTION     x 2   COLON RESECTION N/A 10/20/2013   Procedure: OPEN SIGMOID COLONECTOMY COLOVESICAL FISTULA REPAIR;  Surgeon: Stark Klein, MD;  Location: [facility];  Service: General;  Laterality: N/A;   COLONOSCOPY  08/16/2014   COLONOSCOPY WITH PROPOFOL N/A 08/16/2014   Procedure: COLONOSCOPY WITH PROPOFOL;  Surgeon: Milus Banister, MD;  Location: [facility];  Service: Endoscopy;  Laterality: N/A;   COLONOSCOPY WITH PROPOFOL N/A 12/23/2017   Procedure: COLONOSCOPY WITH PROPOFOL;  Surgeon: Milus Banister, MD;  Location: [facility];  Service: Endoscopy;  Laterality: N/A;   CYSTECTOMY N/A  10/20/2013   Procedure: CYSTECTOMY PARTIAL;  Surgeon: Ardis Hughs, MD;  Location: [facility];  Service: Urology;  Laterality: N/A;   HERNIA REPAIR     LAPAROSCOPIC GASTRIC SLEEVE RESECTION N/A 12/04/2019   Procedure: LAPAROSCOPIC GASTRIC SLEEVE RESECTION, Upper Endo, Eras Pathway;  Surgeon: Clovis Riley, MD;  Location: [facility];  Service: General;  Laterality: N/A;   PORTACATH PLACEMENT N/A 11/15/2013   Procedure: INSERTION PORT-A-CATH;  Surgeon: Stark Klein, MD;  Location: [facility];  Service: General;  Laterality: N/A;   TUBAL LIGATION     UPPER GI ENDOSCOPY N/A 12/04/2019   Procedure: UPPER GI ENDOSCOPY;  Surgeon: Clovis Riley, MD;  Location: [facility];  Service: General;  Laterality: N/A;   VENTRAL HERNIA REPAIR N/A 07/21/2017   Procedure: EXPLORATORY LAPAROTOMY AND LYSIS OF ADHESIONS WITH  REPAIR OF VENTRAL HERNIA WITH MESH ;  Surgeon: Excell Seltzer, MD;  Location: [facility];  Service: General;  Laterality: N/A;   Patient Active Problem List   Diagnosis Date Noted   S/P cervical discectomy 01/30/2022   Cervical myelopathy ([place]) 01/30/2022   Morbid obesity ([place])    History of colon cancer    S/P laparoscopic procedure 07/21/2017   Incisional hernia, reducible 07/16/2017   Nausea and vomiting 07/12/2017   Malignant neoplasm of sigmoid colon ([place]) 03/13/2014   Family history of malignant neoplasm of gastrointestinal tract 03/13/2014   Cancer of  sigmoid colon, invading bladder, T4N0, s/p en bloc resection 10/20/2013 11/06/2013   Protein-calorie malnutrition, severe ([place]) 10/19/2013   Iron deficiency anemia, multifactorial with GI/GU and menstrual losses contributory 10/18/2013   Diverticulitis of colon (without mention of hemorrhage)(562.11) 10/18/2013   Weight loss 10/17/2013   Altered mental status 08/07/2013   UTI (lower urinary tract infection) 08/07/2013   Hypokalemia 08/07/2013   Anemia 08/07/2013    ONSET DATE: 10/01/21  REFERRING DIAG: G95.9 (ICD-10-CM) - Cervical  myelopathy (HCC)   THERAPY DIAG:  Other abnormalities of gait and mobility  Muscle weakness (generalized)  Rationale for Evaluation and Treatment Rehabilitation  SUBJECTIVE:                                                                                                                                                                                              SUBJECTIVE STATEMENT: Patient reports doing fair. Has had a "rough week." Was taking supplements that apparently increased her numbness and weakness in L hemibody- since stopped and some s/s have began to resolve.   Pt accompanied by: self  PERTINENT HISTORY: C4-6 fusion, hx of colon cancer for which she had surgery with resection of part of colon and part of her bladder.  PAIN:  Are you having pain? Yes: NPRS scale: 4/10 Pain location: bil hands Pain description: pins and needles Aggravating factors: "cold air" Relieving factors: none  PRECAUTIONS: None; cervical precautions lifted 9/4  WEIGHT BEARING RESTRICTIONS No  FALLS: Has patient fallen in last 6 months? Yes. Number of falls 2 falls prior to surgery due to legs giving away.  PATIENT GOALS get the feeling back, get the mobility back in my hands and feet.  OBJECTIVE:   TODAY'S TREATMENT:  NMR:  [facility] PT Assessment - 03/06/22 0001       Standardized Balance Assessment   Five times sit to stand comments  16 no UE    10 Meter Walk .98ms      Functional Gait  Assessment   Gait assessed  Yes    Gait Level Surface Walks 20 ft in less than 7 sec but greater than 5.5 sec, uses assistive device, slower speed, mild gait deviations, or deviates 6-10 in outside of the 12 in walkway width.    Change in Gait Speed Able to change speed, demonstrates mild gait deviations, deviates 6-10 in outside of the 12 in walkway width, or no gait deviations, unable to achieve a major change in velocity, or uses a change in velocity, or uses an assistive device.    Gait with  Horizontal Head Turns Performs head turns with moderate changes in gait  velocity, slows down, deviates 10-15 in outside 12 in walkway width but recovers, can continue to walk.    Gait with Vertical Head Turns Performs task with moderate change in gait velocity, slows down, deviates 10-15 in outside 12 in walkway width but recovers, can continue to walk.    Gait and Pivot Turn Turns slowly, requires verbal cueing, or requires several small steps to catch balance following turn and stop    Step Over Obstacle Is able to step over one shoe box (4.5 in total height) but must slow down and adjust steps to clear box safely. May require verbal cueing.    Gait with Narrow Base of Support Ambulates less than 4 steps heel to toe or cannot perform without assistance.    Gait with Eyes Closed Walks 20 ft, slow speed, abnormal gait pattern, evidence for imbalance, deviates 10-15 in outside 12 in walkway width. Requires more than 9 sec to ambulate 20 ft.    Ambulating Backwards Walks 20 ft, slow speed, abnormal gait pattern, evidence for imbalance, deviates 10-15 in outside 12 in walkway width.    Steps Alternating feet, must use rail.    Total Score 12            M-CTSIB: 110s/120s (most difficulty on Airex with EC, NBOS)  -hooklying bridges x10 -> x10 U LE dynadisc -> x5 B LE dynadisc  -resisted sit <> stand-> on Airex-> NBOS on airex    PATIENT EDUCATION: Education details: OM results, continue HEP Person educated: Patient Education method: Explanation and Handouts Education comprehension: verbalized understanding   HOME EXERCISE PROGRAM: Walking Program: Walk with your family. Walk during cooler times of the day. Starting next week, Walk 5 min per day for 7 days. Following week add 5 minutes to your total time. Week 1 10 min Week 2: 15 min Week 3: 20 min Week 4: 25 min...Marland KitchenMarland KitchenUntil you can walk to 30 min per day   Practice sit to stand: 2 x 10 without use of your arms on sturdy chair (put  chair against table or wall to prevent it from sliding).   Access Code: [email or web address] URL: [email or web address] Date: 02/11/2022 Prepared by: Excell Seltzer  Exercises - Forward Step Up with Counter Support  - 1 x daily - 4-5 x weekly - 3 sets - 10 reps - Lateral Step Up with Counter Support  - 1 x daily - 4-5 x weekly - 3 sets - 10 reps - Standing Tandem Balance with Counter Support  - 1 x daily - 7 x weekly - 2 sets - 5 reps - 30 sec hold - Side Stepping with Resistance at Ankles and Counter Support  - 1 x daily - 7 x weekly - 1 sets - 5 reps - Forward Monster Walk with Resistance at Ankles and Counter Support  - 1 x daily - 7 x weekly - 1 sets - 5 reps - Standing Median Nerve Glide  - 1 x daily - 7 x weekly - 1 sets - 5-10 reps - 10 sec hold - Standing Ulnar Nerve Glide  - 1 x daily - 7 x weekly - 1 sets - 5-10 reps - 10 sec hold  Nerve flossing exercises given with a ball. Drawn picture and gave to patient.   GOALS: Goals reviewed with patient? Yes  SHORT TERM GOALS: Target date: 03/04/2022  Pt will be able to perform 5x sit to stand <30 seconds to improve functional strength without use of UE for support Baseline: 47 sec without  use of UE support; 16s no UE  Goal status: MET  2.  Pt will report compliance with walking program and report at least 15 min of walking with st. Cane or no AD to improve walking endurance Baseline: <5 min with cane (eval); 15 mins with SPC Goal status: MET  3.  Pt will be able to go up and down steps with reciprocal gait pattern with use of 1-2 UE support to improve functional strength in LE Baseline: step to pattern must use bil hand rail (eval); intermittent B UE/U UE reciprocal pattern Goal status: MET   LONG TERM GOALS: Target date: 04/01/2022  Pt will be able to perform 5x sit to stand <20 seconds without use of UE support to improve functionals strength with transfers. Baseline: 47 sec without use of UE (eval) Goal status:  INITIAL  2.  Pt will demo gait speed of >80ms with or without using st. Cane to improve functional ambulation and decrease fall risk Baseline: 0.48 m/s with st. Cane (eval) Goal status: INITIAL  3.  Pt will demo >20/30 on FGA to improve functional balance with walking. Baseline: 5/30 (eval) Goal status: INITIAL  4.  Pt will be able to ambulate for 30 min with or without cane to improve walking endurance for community activities. Baseline: <5 min with cane (eval) Goal status: INITIAL  5.  Pt will demo 20 sec improvement on her Modified CTSIB to improve functional standing balance and proprioception. Baseline: 107.3/120 sec (8/16) Goal status: IN PROGRESS   ASSESSMENT:  CLINICAL IMPRESSION: Patient seen for skilled PT session with emphasis on goal assessment and functional strength and coordination. She met 3/3 STG. 10 Meter Walk Test: Patient instructed to walk 10 meters (32.8 ft) as quickly and as safely as possible at their normal speed x2 and at a fast speed x2. Time measured from 2 meter mark to 8 meter mark to accommodate ramp-up and ramp-down.  Normal speed: 0.81 m/s with SPC.  Cut off scores: <0.4 m/s = household Ambulator, 0.4-0.8 m/s = limited community Ambulator, >0.8 m/s = community Ambulator, >1.2 m/s = crossing a street, <1.0 = increased fall risk MCID 0.05 m/s (small), 0.13 m/s (moderate), 0.06 m/s (significant)  (ANPTA Core Set of Outcome Measures for Adults with Neurologic Conditions, 2018). Patient demonstrates increased fall risk as noted by score of 12/30 on  Functional Gait Assessment.   <22/30 = predictive of falls, <20/30 = fall in 6 months, <18/30 = predictive of falls in PD MCID: 5 points stroke population, 4 points geriatric population (ANPTA Core Set of Outcome Measures for Adults with Neurologic Conditions, 2018). Five times Sit to Stand Test (FTSS) Method: Use a straight back chair with a solid seat that is 17-18" high. Ask participant to sit on the chair  with arms folded across their chest.   Instructions: "Stand up and sit down as quickly as possible 5 times, keeping your arms folded across your chest."   Measurement: Stop timing when the participant touches the chair in sitting the 5th time.  TIME: 16 sec with no UE  Cut off scores indicative of increased fall risk: >12 sec CVA, >16 sec PD, >13 sec vestibular (ANPTA Core Set of Outcome Measures for Adults with Neurologic Conditions, 2018). Patient completing M-CTSIB 110s/120s with greatest difficulty on compliant surfaces with NBOS, EC. Continue POC.    OBJECTIVE IMPAIRMENTS Abnormal gait, decreased activity tolerance, decreased balance, decreased endurance, decreased mobility, difficulty walking, decreased ROM, decreased strength, hypomobility, increased fascial restrictions, increased muscle  spasms, impaired flexibility, impaired UE functional use, improper body mechanics, postural dysfunction, and pain.   ACTIVITY LIMITATIONS carrying, lifting, bending, standing, squatting, stairs, and transfers  PARTICIPATION LIMITATIONS: meal prep, cleaning, laundry, driving, shopping, community activity, and yard work  PERSONAL FACTORS Past/current experiences, Time since onset of injury/illness/exacerbation, and 1 comorbidity: C4-6 fusion  are also affecting patient's functional outcome.   REHAB POTENTIAL: Good  CLINICAL DECISION MAKING: Stable/uncomplicated  EVALUATION COMPLEXITY: Low  PLAN: PT FREQUENCY: 2x/week  PT DURATION: 6 weeks  PLANNED INTERVENTIONS: Therapeutic exercises, Therapeutic activity, Neuromuscular re-education, Balance training, Gait training, Patient/Family education, Self Care, Joint mobilization, Stair training, Orthotic/Fit training, Dry Needling, Electrical stimulation, Spinal mobilization, Cryotherapy, Moist heat, Manual therapy, and Re-evaluation  PLAN FOR NEXT SESSION:  Review compliance with walking program and answer any questions FGA: work on FGA components  to improve gait Work on Manufacturing systems engineer with RLE (cone taps, resisted step-taps) Gait mechanics and addressing gait deviations Ladder drills, 4 square stepping, standing ball kicks     Debbora Dus, PT, DPT Debbora Dus, PT, DPT, CBIS  03/06/2022, 1:15 PM

## 2022-03-08 NOTE — Therapy (Signed)
OUTPATIENT OCCUPATIONAL THERAPY NEURO EVALUATION  Patient Name: Satomi Buda MRN: [id number] DOB:[birth year], 51 y.o., female Today's Date: 03/09/2022  PCP: Thayer Ohm, PA REFERRING PROVIDER: Dawley, Theodoro Doing, DO   OT End of Session - 03/09/22 1430     Visit Number 1    Number of Visits 17    Date for OT Re-Evaluation 06/07/22    Authorization Type [employer] 2023  (Same day 1 copay)  No Ded, VL: Unlimited  Auth Reqd: No    OT Start Time 1320    OT Stop Time 1400    OT Time Calculation (min) 40 min    Activity Tolerance Patient tolerated treatment well    Behavior During Therapy [facility] for tasks assessed/performed             Past Medical History:  Diagnosis Date   colon ca dx'd 09/2013   colon   GERD (gastroesophageal reflux disease)    Hyperlipidemia    Morbid obesity ([place])    Past Surgical History:  Procedure Laterality Date   ANTERIOR CERVICAL DECOMP/DISCECTOMY FUSION N/A 01/30/2022   Procedure: Anterior Cervical Decompression/Discectomy Fusion Cervical Four-Five, Cervical Five-Six;  Surgeon: Karsten Ro, DO;  Location: [place];  Service: Neurosurgery;  Laterality: N/A;  Anterior Cervical Decompression/Discectomy Fusion Cervical Four-Five, Cervical Five-Six   BREAST BIOPSY Right    stereo    CESAREAN SECTION     x 2   COLON RESECTION N/A 10/20/2013   Procedure: OPEN SIGMOID COLONECTOMY COLOVESICAL FISTULA REPAIR;  Surgeon: Stark Klein, MD;  [facility];  Service: General;  Laterality: N/A;   COLONOSCOPY  08/16/2014   COLONOSCOPY WITH PROPOFOL N/A 08/16/2014   Procedure: COLONOSCOPY WITH PROPOFOL;  Surgeon: Milus Banister, MD;  [facility];  Service: Endoscopy;  Laterality: N/A;   COLONOSCOPY WITH PROPOFOL N/A 12/23/2017   Procedure: COLONOSCOPY WITH PROPOFOL;  Surgeon: Milus Banister, MD;  [facility];  Service: Endoscopy;  Laterality: N/A;   CYSTECTOMY N/A 10/20/2013   Procedure: CYSTECTOMY PARTIAL;  Surgeon: Ardis Hughs,  MD;  [facility];  Service: Urology;  Laterality: N/A;   HERNIA REPAIR     LAPAROSCOPIC GASTRIC SLEEVE RESECTION N/A 12/04/2019   Procedure: LAPAROSCOPIC GASTRIC SLEEVE RESECTION, Upper Endo, Eras Pathway;  Surgeon: Clovis Riley, MD;  [facility];  Service: General;  Laterality: N/A;   PORTACATH PLACEMENT N/A 11/15/2013   Procedure: INSERTION PORT-A-CATH;  Surgeon: Stark Klein, MD;  [facility];  Service: General;  Laterality: N/A;   TUBAL LIGATION     UPPER GI ENDOSCOPY N/A 12/04/2019   Procedure: UPPER GI ENDOSCOPY;  Surgeon: Clovis Riley, MD;  [facility];  Service: General;  Laterality: N/A;   VENTRAL HERNIA REPAIR N/A 07/21/2017   Procedure: EXPLORATORY LAPAROTOMY AND LYSIS OF ADHESIONS WITH  REPAIR OF VENTRAL HERNIA WITH MESH ;  Surgeon: Excell Seltzer, MD;  [facility];  Service: General;  Laterality: N/A;   Patient Active Problem List   Diagnosis Date Noted   S/P cervical discectomy 01/30/2022   Cervical myelopathy ([place]) 01/30/2022   Morbid obesity ([place])    History of colon cancer    S/P laparoscopic procedure 07/21/2017   Incisional hernia, reducible 07/16/2017   Nausea and vomiting 07/12/2017   Malignant neoplasm of sigmoid colon ([place]) 03/13/2014   Family history of malignant neoplasm of gastrointestinal tract 03/13/2014   Cancer of sigmoid colon, invading bladder, T4N0, s/p en bloc resection 10/20/2013 11/06/2013   Protein-calorie malnutrition, severe ([place])  10/19/2013   Iron deficiency anemia, multifactorial with GI/GU and menstrual losses contributory 10/18/2013   Diverticulitis of colon (without mention of hemorrhage)(562.11) 10/18/2013   Weight loss 10/17/2013   Altered mental status 08/07/2013   UTI (lower urinary tract infection) 08/07/2013   Hypokalemia 08/07/2013   Anemia 08/07/2013    ONSET DATE: symptoms since June, s/p C4-6 ACDF on 01/30/22.   REFERRING DIAG: G95.9 (ICD-10-CM) - Cervical myelopathy ([place])   THERAPY DIAG:  Other  lack of coordination  Other disturbances of skin sensation  Muscle weakness (generalized)  Unsteadiness on feet  Localized edema  Stiffness of left hand, not elsewhere classified  Rationale for Evaluation and Treatment Rehabilitation  SUBJECTIVE:   SUBJECTIVE STATEMENT: "Before this, I was traveling and living my best life"  Pt accompanied by: self  PERTINENT HISTORY: cervical myelopathy with symptoms since June, but s/p C4-6 ACDF on 01/30/22.   Other PMH:  PMH:  Colon CA 2015 (s/p chemo and colon resection), hyperlipidemia, GERD, hx of hernia repair, hx of laparoscopic gastric sleeve resection  PRECAUTIONS: Fall; no lifting, bending, stooping, no lifting more than 5lbs, no driving currently   WEIGHT BEARING RESTRICTIONS No  PAIN:  Are you having pain? Yes: NPRS scale: 1-5/10 Pain location: hands (3rd-4th digits are the worst) Pain description: burning, tingling, numbness, itchy, swell Aggravating factors: use Relieving factors: pain meds   FALLS: Has patient fallen in last 6 months? Yes. 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, and Leisure: traveling, walking, going out in community, gardening (vegetables)  PATIENT GOALS   be able to walk and improve use of my hands  OBJECTIVE:   HAND DOMINANCE: Right  ADLs: Eating: mod I Grooming: Mod I UB Dressing: mod I, able to fasten bra LB Dressing: mod I (unable to pull up jeans/fasten buttons) Toileting: mod I, able to give herself an enema (used both hands) Bathing: mod I  Tub Shower transfers: mod I   IADLs: Shopping: ordered from [employer], able to put groceries away [employer]: performs simple cleaning tasks (not able to mop, son bends to pick up trash after sweeping, uses long  handled cleaning items), unable to gardening  Meal Prep: pt reports difficulty chopping or peeling (son assists or gets pre-chopped), difficulty opening containers/packages (son assists some), uses both hands to carry pots/pans Community mobility: independent prior, not yet cleared for driving Handwriting: Increased time and "challenging" per pt  MOBILITY STATUS: Independent, uses cane in the community, but no device in the home  UPPER EXTREMITY ROM   BUEs grossly WNL except decr L isolated IP flex, L hand approx 75% (difficulty with finger flex of 4-5th digits).  UPPER EXTREMITY MMT:   Proximal strength not tested due to precautions  HAND FUNCTION: Grip strength: Right: 27 lbs; Left: 18.7 lbs, Lateral pinch: Right: 8 lbs, Left: 10 lbs, 3 point pinch: Right: 6 lbs, Left: 8 lbs, and Tip pinch: Right 8 lbs, Left: 10 lbs  COORDINATION: 9 Hole Peg test: Right: 33.53 sec; Left: 27.68 sec  RUE shoulder compensation noted  SENSATION: Pt reports numbness, tingling, burning in both hands (3rd digits are worse), sometimes L elbow (stretches help elbow)  EDEMA: mild edema in L hand  COGNITION: Overall cognitive status: Within functional limits for tasks assessed   TODAY'S TREATMENT:  Evaluation completed    PATIENT EDUCATION:  Education details: OT POC/eval results, recommended pt perform gentle ROM on 4-5th digits of L hand (in flex) to prevent stiffness Person educated: Patient Education method: Explanation Education comprehension: verbalized understanding   HOME EXERCISE PROGRAM: Not yet issued, but recommended pt perform gentle ROM on 4-5th digits of L hand (in flex) to prevent stiffness    GOALS: Potential Goals reviewed with patient? Yes  SHORT TERM GOALS: Target date: 04/07/22  Pt will be independent with HEP for bilateral hand ROM, coordination, and strength. Goal status: INITIAL  2.  Pt will improve R hand coordination for ADLs as shown by completing 9-hole peg test in  less than 28sec. Baseline: 33.53sec Goal status: INITIAL  3.  Pt will improve L hand coordination for ADLs as shown by completing 9-hole peg test in less than 23sec. Baseline:  27.68sec Goal status: INITIAL  4.  Pt will improve R grip strength by at least 8lbs for opening containers and lifting objects. Baseline:  27lbs Goal status: INITIAL  5.  Pt will improve L grip strength by at least 8lbs for opening containers and lifting objects. Baseline:  18.7lbs Goal status: INITIAL  6.  Pt will report incr ease with writing and chopping food (using AE/strategies prn). Goal status: INITIAL  7.  Pt will demo L hand gross finger flex WNL.    Goal status:  INITIAL  LONG TERM GOALS: Target date: 05/07/22, 06/06/22  Pt will be independent with updated HEP for proximal UE strength (once cleared by physician). Goal status: INITIAL  2.  Pt will improve R hand coordination for ADLs as shown by completing 9-hole peg test in less than 23sec. Baseline: 33.53sec Goal status: INITIAL  3.  Pt will improve L grip strength by at least 15lbs for opening containers and lifting objects. Baseline:  18.7lbs Goal status: INITIAL  4.  Pt will verbalize understanding of ways manage bilateral hand pain. Goal status: INITIAL  5.  Pt will be able to use both hands to don jeans and fasten. Goal status: INITIAL  ASSESSMENT:  CLINICAL IMPRESSION: Patient is a 51 y.o. female who was seen today for occupational therapy evaluation for cervical myelopathy s/p  C4-6 ACDF on 01/30/22.  Pt was independent prior to June, but had progressive weakness, numbness, tingling and  was found to have severe stenosis with cord signal change at C5-6.  She has had progressive difficulty walking, dropping objects and weakness in her hands bilaterally that is been ongoing for approximately 3 months. Pt is now s/p discectomy at C4-5, C5-6 for decompression of spinal cord and exiting nerve roots and fusion C4-5, C5-6.  Other PMH includes:   Colon CA 2015 (s/p chemo and colon resection), hyperlipidemia, GERD, hx of herniq repair, hx of laparoscopic gastric sleeve resection.  Pt now having difficulty with IADLs and hand functional use.  Pt presents today with decr sensation, decr coordination, decr strength, edema, decr ROM, decr balance.  Pt would benefit from occupational therapy to address these deficits for improved UE functional use and IADLs performance.  PERFORMANCE DEFICITS in functional skills including ADLs, IADLs, coordination, dexterity, sensation, edema, ROM, strength, pain, FMC, GMC, mobility, balance, decreased knowledge of precautions, decreased knowledge of use of DME, and UE functional use, cognitive skills   IMPAIRMENTS are limiting patient from ADLs, IADLs, and leisure.   COMORBIDITIES may have co-morbidities  that affects occupational performance. Patient will benefit from skilled OT to address above impairments and improve overall function.  MODIFICATION OR ASSISTANCE TO COMPLETE EVALUATION: No modification  of tasks or assist necessary to complete an evaluation.  OT OCCUPATIONAL PROFILE AND HISTORY: Detailed assessment: Review of records and additional review of physical, cognitive, psychosocial history related to current functional performance.  CLINICAL DECISION MAKING: Moderate - several treatment options, min-mod task modification necessary  REHAB POTENTIAL: Good  EVALUATION COMPLEXITY: Moderate    PLAN: OT FREQUENCY: 2x/week  OT DURATION:  eval +16  sessions over 12 weeks due to insurance/scheduling needs  PLANNED INTERVENTIONS: self care/ADL training, therapeutic exercise, therapeutic activity, neuromuscular re-education, manual therapy, passive range of motion, balance training, functional mobility training, aquatic therapy, splinting, electrical stimulation, ultrasound, paraffin, fluidotherapy, moist heat, cryotherapy, patient/family education, energy conservation, and DME and/or AE  instructions  RECOMMENDED OTHER SERVICES: current with PT  CONSULTED AND AGREED WITH PLAN OF CARE: Patient  PLAN FOR NEXT SESSION: initiate putty HEP and coordination HEP, L hand ROM HEP   Zacherie Honeyman, OTR/L 03/09/2022, 2:50 PM

## 2022-03-09 ENCOUNTER — Ambulatory Visit: Payer: Managed Care, Other (non HMO) | Admitting: Physical Therapy

## 2022-03-09 ENCOUNTER — Ambulatory Visit: Payer: Managed Care, Other (non HMO) | Admitting: Occupational Therapy

## 2022-03-09 ENCOUNTER — Encounter: Payer: Self-pay | Admitting: Occupational Therapy

## 2022-03-09 DIAGNOSIS — R278 Other lack of coordination: Secondary | ICD-10-CM

## 2022-03-09 DIAGNOSIS — R2689 Other abnormalities of gait and mobility: Secondary | ICD-10-CM | POA: Diagnosis not present

## 2022-03-09 DIAGNOSIS — M6281 Muscle weakness (generalized): Secondary | ICD-10-CM

## 2022-03-09 DIAGNOSIS — R2681 Unsteadiness on feet: Secondary | ICD-10-CM

## 2022-03-09 DIAGNOSIS — M25642 Stiffness of left hand, not elsewhere classified: Secondary | ICD-10-CM

## 2022-03-09 DIAGNOSIS — R600 Localized edema: Secondary | ICD-10-CM

## 2022-03-09 DIAGNOSIS — R208 Other disturbances of skin sensation: Secondary | ICD-10-CM

## 2022-03-09 NOTE — Therapy (Signed)
OUTPATIENT PHYSICAL THERAPY NEURO TREATMENT   Patient Name: Amy Bentley MRN: [id number] DOB:03-Sep-1970, 51 y.o., female Today's Date: 03/09/2022   PCP: Thayer Ohm, PA REFERRING PROVIDER: Dawley, Theodoro Doing, DO    PT End of Session - 03/09/22 1233     Visit Number 8    Number of Visits 17    Date for PT Re-Evaluation 04/01/22    Authorization Type [employer]; $35 copay (Same day 1 copay)  No Ded  OPM $2500/$5000 - $1159 Met  VL: Unlimited    Progress Note Due on Visit 10    PT Start Time 1231    PT Stop Time 1315    PT Time Calculation (min) 44 min    Equipment Utilized During Treatment Gait belt    Activity Tolerance Patient tolerated treatment well    Behavior During Therapy WFL for tasks assessed/performed                 Past Medical History:  Diagnosis Date   colon ca dx'd 09/2013   colon   GERD (gastroesophageal reflux disease)    Hyperlipidemia    Morbid obesity ([place])    Past Surgical History:  Procedure Laterality Date   ANTERIOR CERVICAL DECOMP/DISCECTOMY FUSION N/A 01/30/2022   Procedure: Anterior Cervical Decompression/Discectomy Fusion Cervical Four-Five, Cervical Five-Six;  Surgeon: Karsten Ro, DO;  Location: [place];  Service: Neurosurgery;  Laterality: N/A;  Anterior Cervical Decompression/Discectomy Fusion Cervical Four-Five, Cervical Five-Six   BREAST BIOPSY Right    stereo    CESAREAN SECTION     x 2   COLON RESECTION N/A 10/20/2013   Procedure: OPEN SIGMOID COLONECTOMY COLOVESICAL FISTULA REPAIR;  Surgeon: Stark Klein, MD;  [facility];  Service: General;  Laterality: N/A;   COLONOSCOPY  08/16/2014   COLONOSCOPY WITH PROPOFOL N/A 08/16/2014   Procedure: COLONOSCOPY WITH PROPOFOL;  Surgeon: Milus Banister, MD;  [facility];  Service: Endoscopy;  Laterality: N/A;   COLONOSCOPY WITH PROPOFOL N/A 12/23/2017   Procedure: COLONOSCOPY WITH PROPOFOL;  Surgeon: Milus Banister, MD;  [facility];  Service: Endoscopy;   Laterality: N/A;   CYSTECTOMY N/A 10/20/2013   Procedure: CYSTECTOMY PARTIAL;  Surgeon: Ardis Hughs, MD;  [facility];  Service: Urology;  Laterality: N/A;   HERNIA REPAIR     LAPAROSCOPIC GASTRIC SLEEVE RESECTION N/A 12/04/2019   Procedure: LAPAROSCOPIC GASTRIC SLEEVE RESECTION, Upper Endo, Eras Pathway;  Surgeon: Clovis Riley, MD;  [facility];  Service: General;  Laterality: N/A;   PORTACATH PLACEMENT N/A 11/15/2013   Procedure: INSERTION PORT-A-CATH;  Surgeon: Stark Klein, MD;  [facility];  Service: General;  Laterality: N/A;   TUBAL LIGATION     UPPER GI ENDOSCOPY N/A 12/04/2019   Procedure: UPPER GI ENDOSCOPY;  Surgeon: Clovis Riley, MD;  [facility];  Service: General;  Laterality: N/A;   VENTRAL HERNIA REPAIR N/A 07/21/2017   Procedure: EXPLORATORY LAPAROTOMY AND LYSIS OF ADHESIONS WITH  REPAIR OF VENTRAL HERNIA WITH MESH ;  Surgeon: Excell Seltzer, MD;  [facility];  Service: General;  Laterality: N/A;   Patient Active Problem List   Diagnosis Date Noted   S/P cervical discectomy 01/30/2022   Cervical myelopathy ([place]) 01/30/2022   Morbid obesity ([place])    History of colon cancer    S/P laparoscopic procedure 07/21/2017   Incisional hernia, reducible 07/16/2017   Nausea and vomiting 07/12/2017   Malignant neoplasm of sigmoid colon ([place]) 03/13/2014   Family history of  malignant neoplasm of gastrointestinal tract 03/13/2014   Cancer of sigmoid colon, invading bladder, T4N0, s/p en bloc resection 10/20/2013 11/06/2013   Protein-calorie malnutrition, severe ([place]) 10/19/2013   Iron deficiency anemia, multifactorial with GI/GU and menstrual losses contributory 10/18/2013   Diverticulitis of colon (without mention of hemorrhage)(562.11) 10/18/2013   Weight loss 10/17/2013   Altered mental status 08/07/2013   UTI (lower urinary tract infection) 08/07/2013   Hypokalemia 08/07/2013   Anemia 08/07/2013    ONSET DATE: 10/01/21  REFERRING DIAG:  G95.9 (ICD-10-CM) - Cervical myelopathy ([facility])   THERAPY DIAG:  Other abnormalities of gait and mobility  Muscle weakness (generalized)  Rationale for Evaluation and Treatment Rehabilitation  SUBJECTIVE:                                                                                                                                                                                              SUBJECTIVE STATEMENT: Pt reports her L-hemibody pain is improved from last week since she stopped taking the supplements. Pt reports her R side sensation is improving but feels "heavy". Pt reports ongoing nerve pain in her hands, is trying to wean herself off of oxy but feels like gabapentin has not been effective. Pt also reports pain in R side of her neck when turning her head to the R.  Pt accompanied by: self  PERTINENT HISTORY: C4-6 fusion, hx of colon cancer for which she had surgery with resection of part of colon and part of her bladder.  PAIN:  Are you having pain? Yes: NPRS scale: 4/10 Pain location: bil hands Pain description: pins and needles Aggravating factors: "cold air" Relieving factors: none  PRECAUTIONS: None; cervical precautions lifted 9/4  WEIGHT BEARING RESTRICTIONS No  FALLS: Has patient fallen in last 6 months? Yes. Number of falls 2 falls prior to surgery due to legs giving away.  PATIENT GOALS get the feeling back, get the mobility back in my hands and feet.  OBJECTIVE:   TODAY'S TREATMENT:  NMR: Sidesteps with alt L/R with gum-drop taps with CGA for balance, 3 x 10 ft each direction. Attempt to progress to alt L/R cone taps but pt exhibits difficulty with this task due to height of target.  Sit to stand with 4" step under LLE to encourage weight shift to RLE x 10 reps, progression to sit to stand with RLE march after standing x 10 reps; sit to stand to 4" step under LLE with RLE march after standing x 10 reps for RLE balance  4 square-stepping with most  difficulty with stepping backwards, CGA for balance but no UE support  Standing alt  L/R yard-stick step-overs with minimal difficulty. Progression to alt L/R 3" beam step-overs, occasional min HHA needed for support when stepping back with RLE, 2 x 10 reps. Progression to full 3" beam step-overs and step-backs with min HHA for backwards stepping, 2 x 5 reps.   GAIT: Gait pattern: decreased hip/knee flexion- Right, decreased ankle dorsiflexion- Right, ataxic, and trendelenburg Distance walked: 115 ft Assistive device utilized: None Level of assistance: Modified independence Comments: trial gait in clinic with no AD as pt reports she is ambulating at home without AD, just uses [facility] for community mobility. Pt exhibits above gait deviations during gait with no AD.   PATIENT EDUCATION: Education details: continue HEP, encouraged pt to lightly stretch neck into pain but to back off if it becomes too painful Person educated: Patient Education method: Explanation and Handouts Education comprehension: verbalized understanding   HOME EXERCISE PROGRAM: Walking Program: Walk with your family. Walk during cooler times of the day. Starting next week, Walk 5 min per day for 7 days. Following week add 5 minutes to your total time. Week 1 10 min Week 2: 15 min Week 3: 20 min Week 4: 25 min...Marland KitchenMarland KitchenUntil you can walk to 30 min per day   Practice sit to stand: 2 x 10 without use of your arms on sturdy chair (put chair against table or wall to prevent it from sliding).   Access Code: [email or web address] URL: [email or web address] Date: 02/11/2022 Prepared by: Excell Seltzer  Exercises - Forward Step Up with Counter Support  - 1 x daily - 4-5 x weekly - 3 sets - 10 reps - Lateral Step Up with Counter Support  - 1 x daily - 4-5 x weekly - 3 sets - 10 reps - Standing Tandem Balance with Counter Support  - 1 x daily - 7 x weekly - 2 sets - 5 reps - 30 sec hold - Side Stepping with Resistance at  Ankles and Counter Support  - 1 x daily - 7 x weekly - 1 sets - 5 reps - Forward Monster Walk with Resistance at Ankles and Counter Support  - 1 x daily - 7 x weekly - 1 sets - 5 reps - Standing Median Nerve Glide  - 1 x daily - 7 x weekly - 1 sets - 5-10 reps - 10 sec hold - Standing Ulnar Nerve Glide  - 1 x daily - 7 x weekly - 1 sets - 5-10 reps - 10 sec hold  Nerve flossing exercises given with a ball. Drawn picture and gave to patient.   GOALS: Goals reviewed with patient? Yes  SHORT TERM GOALS: Target date: 03/04/2022  Pt will be able to perform 5x sit to stand <30 seconds to improve functional strength without use of UE for support Baseline: 47 sec without use of UE support; 16s no UE  Goal status: MET  2.  Pt will report compliance with walking program and report at least 15 min of walking with st. Cane or no AD to improve walking endurance Baseline: <5 min with cane (eval); 15 mins with SPC Goal status: MET  3.  Pt will be able to go up and down steps with reciprocal gait pattern with use of 1-2 UE support to improve functional strength in LE Baseline: step to pattern must use bil hand rail (eval); intermittent B UE/U UE reciprocal pattern Goal status: MET   LONG TERM GOALS: Target date: 04/01/2022  Pt will be able to perform 5x sit to stand <20 seconds without use  of UE support to improve functionals strength with transfers. Baseline: 47 sec without use of UE (eval) Goal status: INITIAL  2.  Pt will demo gait speed of >64ms with or without using st. Cane to improve functional ambulation and decrease fall risk Baseline: 0.48 m/s with st. Cane (eval) Goal status: INITIAL  3.  Pt will demo >20/30 on FGA to improve functional balance with walking. Baseline: 5/30 (eval) Goal status: INITIAL  4.  Pt will be able to ambulate for 30 min with or without cane to improve walking endurance for community activities. Baseline: <5 min with cane (eval) Goal status: INITIAL  5.  Pt  will demo 20 sec improvement on her Modified CTSIB to improve functional standing balance and proprioception. Baseline: 107.3/120 sec (8/16) Goal status: IN PROGRESS   ASSESSMENT:  CLINICAL IMPRESSION: Emphasis of skilled PT session on continuing to work on balance and increasing step length and RLE stance time during gait. Pt continues to exhibit decreased balance and confidence with stance on RLE as compared to LLE and continues to benefit from skilled therapy services to address ongoing strength deficits and gait impairments. Continue POC.   OBJECTIVE IMPAIRMENTS Abnormal gait, decreased activity tolerance, decreased balance, decreased endurance, decreased mobility, difficulty walking, decreased ROM, decreased strength, hypomobility, increased fascial restrictions, increased muscle spasms, impaired flexibility, impaired UE functional use, improper body mechanics, postural dysfunction, and pain.   ACTIVITY LIMITATIONS carrying, lifting, bending, standing, squatting, stairs, and transfers  PARTICIPATION LIMITATIONS: meal prep, cleaning, laundry, driving, shopping, community activity, and yard work  PERSONAL FACTORS Past/current experiences, Time since onset of injury/illness/exacerbation, and 1 comorbidity: C4-6 fusion  are also affecting patient's functional outcome.   REHAB POTENTIAL: Good  CLINICAL DECISION MAKING: Stable/uncomplicated  EVALUATION COMPLEXITY: Low  PLAN: PT FREQUENCY: 2x/week  PT DURATION: 6 weeks  PLANNED INTERVENTIONS: Therapeutic exercises, Therapeutic activity, Neuromuscular re-education, Balance training, Gait training, Patient/Family education, Self Care, Joint mobilization, Stair training, Orthotic/Fit training, Dry Needling, Electrical stimulation, Spinal mobilization, Cryotherapy, Moist heat, Manual therapy, and Re-evaluation  PLAN FOR NEXT SESSION:  Review compliance with walking program and answer any questions FGA: work on FGA components to improve  gait Work on bManufacturing systems engineerwith RLE (cone taps, resisted step-taps) Gait mechanics and addressing gait deviations Ladder drills, 4 square stepping, standing ball kicks     TExcell Seltzer PT, DPT, CSRS   03/09/2022, 1:17 PM

## 2022-03-16 ENCOUNTER — Ambulatory Visit: Payer: Managed Care, Other (non HMO) | Admitting: Physical Therapy

## 2022-03-16 ENCOUNTER — Ambulatory Visit: Payer: Managed Care, Other (non HMO) | Admitting: Occupational Therapy

## 2022-03-18 NOTE — Therapy (Signed)
OUTPATIENT OCCUPATIONAL THERAPY NEURO EVALUATION  Patient Name: Amy Bentley MRN: [id number] DOB:Jul 25, 1970, 51 y.o., female Today's Date: 03/19/2022  PCP: Thayer Ohm, PA REFERRING PROVIDER: Dawley, Theodoro Doing, DO   OT End of Session - 03/19/22 1237     Visit Number 2    Number of Visits 17    Date for OT Re-Evaluation 06/07/22    Authorization Type [employer] 2023  (Same day 1 copay)  No Ded, VL: Unlimited  Auth Reqd: No    OT Start Time 1237    OT Stop Time 1315    OT Time Calculation (min) 38 min    Activity Tolerance Patient tolerated treatment well    Behavior During Therapy [facility] for tasks assessed/performed              Past Medical History:  Diagnosis Date   colon ca dx'd 09/2013   colon   GERD (gastroesophageal reflux disease)    Hyperlipidemia    Morbid obesity ([place])    Past Surgical History:  Procedure Laterality Date   ANTERIOR CERVICAL DECOMP/DISCECTOMY FUSION N/A 01/30/2022   Procedure: Anterior Cervical Decompression/Discectomy Fusion Cervical Four-Five, Cervical Five-Six;  Surgeon: Karsten Ro, DO;  Location: [place];  Service: Neurosurgery;  Laterality: N/A;  Anterior Cervical Decompression/Discectomy Fusion Cervical Four-Five, Cervical Five-Six   BREAST BIOPSY Right    stereo    CESAREAN SECTION     x 2   COLON RESECTION N/A 10/20/2013   Procedure: OPEN SIGMOID COLONECTOMY COLOVESICAL FISTULA REPAIR;  Surgeon: Stark Klein, MD;  [facility];  Service: General;  Laterality: N/A;   COLONOSCOPY  08/16/2014   COLONOSCOPY WITH PROPOFOL N/A 08/16/2014   Procedure: COLONOSCOPY WITH PROPOFOL;  Surgeon: Milus Banister, MD;  [facility];  Service: Endoscopy;  Laterality: N/A;   COLONOSCOPY WITH PROPOFOL N/A 12/23/2017   Procedure: COLONOSCOPY WITH PROPOFOL;  Surgeon: Milus Banister, MD;  [facility];  Service: Endoscopy;  Laterality: N/A;   CYSTECTOMY N/A 10/20/2013   Procedure: CYSTECTOMY PARTIAL;  Surgeon: Ardis Hughs, MD;  [facility];  Service: Urology;  Laterality: N/A;   HERNIA REPAIR     LAPAROSCOPIC GASTRIC SLEEVE RESECTION N/A 12/04/2019   Procedure: LAPAROSCOPIC GASTRIC SLEEVE RESECTION, Upper Endo, Eras Pathway;  Surgeon: Clovis Riley, MD;  [facility];  Service: General;  Laterality: N/A;   PORTACATH PLACEMENT N/A 11/15/2013   Procedure: INSERTION PORT-A-CATH;  Surgeon: Stark Klein, MD;  [facility];  Service: General;  Laterality: N/A;   TUBAL LIGATION     UPPER GI ENDOSCOPY N/A 12/04/2019   Procedure: UPPER GI ENDOSCOPY;  Surgeon: Clovis Riley, MD;  [facility];  Service: General;  Laterality: N/A;   VENTRAL HERNIA REPAIR N/A 07/21/2017   Procedure: EXPLORATORY LAPAROTOMY AND LYSIS OF ADHESIONS WITH  REPAIR OF VENTRAL HERNIA WITH MESH ;  Surgeon: Excell Seltzer, MD;  [facility];  Service: General;  Laterality: N/A;   Patient Active Problem List   Diagnosis Date Noted   S/P cervical discectomy 01/30/2022   Cervical myelopathy ([place]) 01/30/2022   Morbid obesity ([place])    History of colon cancer    S/P laparoscopic procedure 07/21/2017   Incisional hernia, reducible 07/16/2017   Nausea and vomiting 07/12/2017   Malignant neoplasm of sigmoid colon ([place]) 03/13/2014   Family history of malignant neoplasm of gastrointestinal tract 03/13/2014   Cancer of sigmoid colon, invading bladder, T4N0, s/p en bloc resection 10/20/2013 11/06/2013   Protein-calorie malnutrition, severe (  [place]) 10/19/2013   Iron deficiency anemia, multifactorial with GI/GU and menstrual losses contributory 10/18/2013   Diverticulitis of colon (without mention of hemorrhage)(562.11) 10/18/2013   Weight loss 10/17/2013   Altered mental status 08/07/2013   UTI (lower urinary tract infection) 08/07/2013   Hypokalemia 08/07/2013   Anemia 08/07/2013    ONSET DATE: symptoms since June, s/p C4-6 ACDF on 01/30/22.   REFERRING DIAG: G95.9 (ICD-10-CM) - Cervical myelopathy ([place])   THERAPY  DIAG:  Other lack of coordination  Muscle weakness (generalized)  Other disturbances of skin sensation  Unsteadiness on feet  Stiffness of left hand, not elsewhere classified  Rationale for Evaluation and Treatment Rehabilitation  SUBJECTIVE:   SUBJECTIVE STATEMENT: Pt reports that she drove today.  Has to go into office next week.  I did some sweeping and mopping and cooking.  Opening packages is still a little difficult, but I have scissors.   Pt accompanied by: self  PERTINENT HISTORY: cervical myelopathy with symptoms since June, but s/p C4-6 ACDF on 01/30/22.   Other PMH:  PMH:  Colon CA 2015 (s/p chemo and colon resection), hyperlipidemia, GERD, hx of hernia repair, hx of laparoscopic gastric sleeve resection  PRECAUTIONS: Fall; bending, stooping, no lifting more than 5lbs, 03/19/22--now driving   WEIGHT BEARING RESTRICTIONS No  PAIN:  Are you having pain? Yes: NPRS scale: 4/10 Pain location: hands (3rd-4th digits are the worst) Pain description: burning, tingling, numbness, itchy, swell Aggravating factors: use Relieving factors: pain meds   FALLS: Has patient fallen in last 6 months? Yes. 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 (And education section)   PATIENT EDUCATION: Education details: Coordination HEP, Green Putty HEP,  L hand tendon glides.  Recommended pt ask about remaining restrictions (particularly lifting restriction at next MD appt next week). Person educated: Patient Education  method: Explanation, Demonstration, Verbal cues, and Handouts Education comprehension: verbalized understanding, returned demonstration, and verbal cues required   HOME EXERCISE PROGRAM: At eval:  recommended pt perform gentle ROM on 4-5th digits of L hand (in flex) to prevent stiffness 03/19/22:  Coordination HEP, Green Putty HEP,  L hand tendon glides    GOALS: Potential Goals reviewed with patient? Yes  SHORT TERM GOALS: Target date: 04/07/22  Pt will be independent with HEP for bilateral hand ROM, coordination, and strength. Goal status: INITIAL  2.  Pt will improve R hand coordination for ADLs as shown by completing 9-hole peg test in less than 28sec. Baseline: 33.53sec Goal status: INITIAL  3.  Pt will improve L hand coordination for ADLs as shown by completing 9-hole peg test in less than 23sec. Baseline:  27.68sec Goal status: INITIAL  4.  Pt will improve R grip strength by at least 8lbs for opening containers and lifting objects. Baseline:  27lbs Goal status: INITIAL  5.  Pt will improve L grip strength by at least 8lbs for opening containers and lifting objects. Baseline:  18.7lbs Goal status: INITIAL  6.  Pt will report incr ease with writing and chopping food (using AE/strategies prn). Goal status: INITIAL  7.  Pt will demo L hand gross finger flex WNL.    Goal status:  INITIAL  LONG TERM GOALS: Target date: 05/07/22, 06/06/22  Pt will be independent with updated HEP for proximal UE strength (once cleared by physician). Goal status: INITIAL  2.  Pt will improve R hand coordination for ADLs as shown by completing 9-hole peg test in less than 23sec. Baseline: 33.53sec Goal status: INITIAL  3.  Pt will improve L grip strength by at least 15lbs for opening containers and lifting objects. Baseline:  18.7lbs Goal status: INITIAL  4.  Pt will verbalize understanding of ways manage bilateral hand pain. Goal status: INITIAL  5.  Pt will be able to use both hands  to don jeans and fasten. Goal status: INITIAL  ASSESSMENT:  CLINICAL IMPRESSION: Pt is progressing towards goals with incr IADL performance, hand pain remains a limiting factor.   PERFORMANCE DEFICITS in functional skills including ADLs, IADLs, coordination, dexterity, sensation, edema, ROM, strength, pain, FMC, GMC, mobility, balance, decreased knowledge of precautions, decreased knowledge of use of DME, and UE functional use, cognitive skills   IMPAIRMENTS are limiting patient from ADLs, IADLs, and leisure.   COMORBIDITIES may have co-morbidities  that affects occupational performance. Patient will benefit from skilled OT to address above impairments and improve overall function.  MODIFICATION OR ASSISTANCE TO COMPLETE EVALUATION: No modification of tasks or assist necessary to complete an evaluation.  OT OCCUPATIONAL PROFILE AND HISTORY: Detailed assessment: Review of records and additional review of physical, cognitive, psychosocial history related to current functional performance.  CLINICAL DECISION MAKING: Moderate - several treatment options, min-mod task modification necessary  REHAB POTENTIAL: Good  EVALUATION COMPLEXITY: Moderate    PLAN: OT FREQUENCY: 2x/week  OT DURATION:  eval +16  sessions over 12 weeks due to insurance/scheduling needs  PLANNED INTERVENTIONS: self care/ADL training, therapeutic exercise, therapeutic activity, neuromuscular re-education, manual therapy, passive range of motion, balance training, functional mobility training, aquatic therapy, splinting, electrical stimulation, ultrasound, paraffin, fluidotherapy, moist heat, cryotherapy, patient/family education, energy conservation, and DME and/or AE instructions  RECOMMENDED OTHER SERVICES: current with PT  CONSULTED AND AGREED WITH PLAN OF CARE: Patient  PLAN FOR NEXT SESSION:  review HEP prn, continue to address hand strength and coordination.    Lara Palinkas, OTR/L 03/19/2022, 2:10  PM

## 2022-03-19 ENCOUNTER — Encounter: Payer: Self-pay | Admitting: Occupational Therapy

## 2022-03-19 ENCOUNTER — Ambulatory Visit: Payer: Managed Care, Other (non HMO) | Admitting: Physical Therapy

## 2022-03-19 ENCOUNTER — Ambulatory Visit: Payer: Managed Care, Other (non HMO) | Admitting: Occupational Therapy

## 2022-03-19 ENCOUNTER — Encounter: Payer: Self-pay | Admitting: Physical Therapy

## 2022-03-19 DIAGNOSIS — R278 Other lack of coordination: Secondary | ICD-10-CM

## 2022-03-19 DIAGNOSIS — R208 Other disturbances of skin sensation: Secondary | ICD-10-CM

## 2022-03-19 DIAGNOSIS — M6281 Muscle weakness (generalized): Secondary | ICD-10-CM

## 2022-03-19 DIAGNOSIS — R2681 Unsteadiness on feet: Secondary | ICD-10-CM

## 2022-03-19 DIAGNOSIS — R2689 Other abnormalities of gait and mobility: Secondary | ICD-10-CM

## 2022-03-19 DIAGNOSIS — M25642 Stiffness of left hand, not elsewhere classified: Secondary | ICD-10-CM

## 2022-03-19 NOTE — Patient Instructions (Addendum)
  Coordination Activities  Perform the following activities for 15 minutes 1 times per day with both hand(s).  Rotate ball in fingertips (clockwise and counter-clockwise). Toss ball between hands. Toss ball in air and catch with the same hand. Flip cards 1 at a time as fast as you can. Deal cards with your thumb (Hold deck in hand and push card off top with thumb). Shuffle cards. Pick up coins and stack. Pick up coins one at a time until you get 5-10 in your hand, then move coins from palm to fingertips to place in coin bank one at a time. Rotate 2 golf balls in your hand each way.     Flexor Tendon Gliding (Active Hook Fist)   With fingers and knuckles straight, bend middle and tip joints. Do not bend large knuckles. Repeat 10 times. Do 2 sessions per day.   Flexor Tendon Gliding (Active Full Fist)   Straighten all fingers, then make a fist, bending all joints. Repeat 10 times. Do 2 sessions per day.   Flexor Tendon Gliding (Active Straight Fist)   Start with fingers straight. Bend knuckles and middle joints. Keep fingertip joints straight to touch base of palm. Repeat 10 times. Do 2 sessions per day.       Squeeze putty using thumb and all fingers. Repeat 15 times. Do 1-2 sessions per day.   Extension (Assistive Putty)   Roll putty back and forth, being sure to use all fingertips. Repeat 2-3 times. Do 1-2 sessions per day.  Then pinch as below.

## 2022-03-19 NOTE — Therapy (Signed)
OUTPATIENT PHYSICAL THERAPY NEURO TREATMENT   Patient Name: Amy Bentley MRN: [id number] DOB:Dec 13, 1970, 51 y.o., female Today's Date: 03/19/2022   PCP: Thayer Ohm, PA REFERRING PROVIDER: Dawley, Theodoro Doing, DO    PT End of Session - 03/19/22 1318     Visit Number 9    Number of Visits 17    Date for PT Re-Evaluation 04/01/22    Authorization Type [employer]; $35 copay (Same day 1 copay)  No Ded  OPM $2500/$5000 - $1159 Met  VL: Unlimited    PT Start Time 1317    PT Stop Time 1400    PT Time Calculation (min) 43 min    Equipment Utilized During Treatment Gait belt    Activity Tolerance Patient tolerated treatment well    Behavior During Therapy WFL for tasks assessed/performed                 Past Medical History:  Diagnosis Date   colon ca dx'd 09/2013   colon   GERD (gastroesophageal reflux disease)    Hyperlipidemia    Morbid obesity ([place])    Past Surgical History:  Procedure Laterality Date   ANTERIOR CERVICAL DECOMP/DISCECTOMY FUSION N/A 01/30/2022   Procedure: Anterior Cervical Decompression/Discectomy Fusion Cervical Four-Five, Cervical Five-Six;  Surgeon: Karsten Ro, DO;  Location: [place];  Service: Neurosurgery;  Laterality: N/A;  Anterior Cervical Decompression/Discectomy Fusion Cervical Four-Five, Cervical Five-Six   BREAST BIOPSY Right    stereo    CESAREAN SECTION     x 2   COLON RESECTION N/A 10/20/2013   Procedure: OPEN SIGMOID COLONECTOMY COLOVESICAL FISTULA REPAIR;  Surgeon: Stark Klein, MD;  [facility];  Service: General;  Laterality: N/A;   COLONOSCOPY  08/16/2014   COLONOSCOPY WITH PROPOFOL N/A 08/16/2014   Procedure: COLONOSCOPY WITH PROPOFOL;  Surgeon: Milus Banister, MD;  [facility];  Service: Endoscopy;  Laterality: N/A;   COLONOSCOPY WITH PROPOFOL N/A 12/23/2017   Procedure: COLONOSCOPY WITH PROPOFOL;  Surgeon: Milus Banister, MD;  [facility];  Service: Endoscopy;  Laterality: N/A;   CYSTECTOMY N/A  10/20/2013   Procedure: CYSTECTOMY PARTIAL;  Surgeon: Ardis Hughs, MD;  [facility];  Service: Urology;  Laterality: N/A;   HERNIA REPAIR     LAPAROSCOPIC GASTRIC SLEEVE RESECTION N/A 12/04/2019   Procedure: LAPAROSCOPIC GASTRIC SLEEVE RESECTION, Upper Endo, Eras Pathway;  Surgeon: Clovis Riley, MD;  [facility];  Service: General;  Laterality: N/A;   PORTACATH PLACEMENT N/A 11/15/2013   Procedure: INSERTION PORT-A-CATH;  Surgeon: Stark Klein, MD;  [facility];  Service: General;  Laterality: N/A;   TUBAL LIGATION     UPPER GI ENDOSCOPY N/A 12/04/2019   Procedure: UPPER GI ENDOSCOPY;  Surgeon: Clovis Riley, MD;  [facility];  Service: General;  Laterality: N/A;   VENTRAL HERNIA REPAIR N/A 07/21/2017   Procedure: EXPLORATORY LAPAROTOMY AND LYSIS OF ADHESIONS WITH  REPAIR OF VENTRAL HERNIA WITH MESH ;  Surgeon: Excell Seltzer, MD;  [facility];  Service: General;  Laterality: N/A;   Patient Active Problem List   Diagnosis Date Noted   S/P cervical discectomy 01/30/2022   Cervical myelopathy ([place]) 01/30/2022   Morbid obesity ([place])    History of colon cancer    S/P laparoscopic procedure 07/21/2017   Incisional hernia, reducible 07/16/2017   Nausea and vomiting 07/12/2017   Malignant neoplasm of sigmoid colon ([place]) 03/13/2014   Family history of malignant neoplasm of gastrointestinal tract 03/13/2014   Cancer  of sigmoid colon, invading bladder, T4N0, s/p en bloc resection 10/20/2013 11/06/2013   Protein-calorie malnutrition, severe ([place]) 10/19/2013   Iron deficiency anemia, multifactorial with GI/GU and menstrual losses contributory 10/18/2013   Diverticulitis of colon (without mention of hemorrhage)(562.11) 10/18/2013   Weight loss 10/17/2013   Altered mental status 08/07/2013   UTI (lower urinary tract infection) 08/07/2013   Hypokalemia 08/07/2013   Anemia 08/07/2013    ONSET DATE: 10/01/21  REFERRING DIAG: G95.9 (ICD-10-CM) - Cervical  myelopathy ([facility])   THERAPY DIAG:  Muscle weakness (generalized)  Unsteadiness on feet  Other abnormalities of gait and mobility  Other disturbances of skin sensation  Rationale for Evaluation and Treatment Rehabilitation  PERTINENT HISTORY: C4-6 fusion, hx of colon cancer for which she had surgery with resection of part of colon and part of her bladder.  PRECAUTIONS: None; cervical precautions lifted 9/4  WEIGHT BEARING RESTRICTIONS No  FALLS: Has patient fallen in last 6 months? Yes. Number of falls 2 falls prior to surgery due to legs giving away.  PATIENT GOALS get the feeling back, get the mobility back in my hands and feet                                                                                                                                                                                          SUBJECTIVE STATEMENT: Up to 20 minutes with walking program. Was able to drive here today with no neck pain (does report being cleared to drive). Also having increased feeling in areas her left LE and areas of her back, full feeling in the left foot.  .  Pt accompanied by: self  PAIN:  Are you having pain? Yes: NPRS scale: 6-7/10 Pain location: bil hands Pain description: pins and needles Aggravating factors: varied movements at random times Relieving factors: pain medication (oxy or tylenol, trying to wean off oxy)     TODAY'S TREATMENT:  GAIT: Gait pattern: decreased hip/knee flexion- Right, decreased ankle dorsiflexion- Right, ataxic, and trendelenburg Distance walked: 115 x 1 no device indoors, ~300 feet with cane on in/outdoor surfaces, around clinic no device Assistive device utilized: Single point cane and None Level of assistance: SBA, CGA, and Min A Comments: began with gait on indoor level surfaces with no device with SBA, gait deviations as listed above with no balance loss noted. Transitioned back to use of cane for gait outdoors on gravel, grass  (including hills) and paved surfaces with min assist on gravel/grass and CGA on paved surfaces. Mild increase in postural sway with no significant balance loss noted. Back to gait with no device around clinic between tasks with mostly SBA,  CGA when fatigued.   BALANCE/NMR:  Side Stepping: on blue foam beam for 4 laps each left<>right with light touch on bars. Cues on posture, step length/height. SBA for safety Tandem Walking: on blue foam beam for 4 laps each forward/backwards with light touch on bars. Cues for step placement on center of beam and posture. CGA for safety Stepping Strategy: on blue foam beam: standing across beam with feet hip width apart for alternating forward stepping to floor/back onto beam, then alternating backward stepping to floor/back onto beam for ~10 reps each/each way. Hands hovering over bars with occasional touch for balance. SBA for safety.     4 small hurdles next to counter top: Forward reciprocal stepping with single UE support x 6 laps. Cues for increased hip/knee flexion/decreased circumduction with pt hitting hurdles x 4 total with the 6 laps. CGA for safety. Lateral stepping over them (bil feet between each hurdle) x 3 laps toward each direction with single UE support. Cues for increased hip/knee flexion and step placement between the hurdles. CGA for safety.  Standing on solid floor next to mat table with 2 small foam bubbles on floor in front:  Alternating forward foot taps x 10 reps each side with cues for stance position/weight shifting. CGA to min assist for balance.  Alternating cross foot taps x 10 reps each side with cues on stance position/weight shifting with up to min assist needed.       PATIENT EDUCATION: Education details: continue HEP, encouraged pt to lightly stretch neck into pain but to back off if it becomes too painful Person educated: Patient Education method: Explanation and Handouts Education comprehension: verbalized  understanding   HOME EXERCISE PROGRAM: Walking Program: Walk with your family. Walk during cooler times of the day. Starting next week, Walk 5 min per day for 7 days. Following week add 5 minutes to your total time. Week 1 10 min Week 2: 15 min Week 3: 20 min Week 4: 25 min...Marland KitchenMarland KitchenUntil you can walk to 30 min per day   Practice sit to stand: 2 x 10 without use of your arms on sturdy chair (put chair against table or wall to prevent it from sliding).   Access Code: [email or web address] URL: [email or web address] Date: 02/11/2022 Prepared by: Excell Seltzer  Exercises - Forward Step Up with Counter Support  - 1 x daily - 4-5 x weekly - 3 sets - 10 reps - Lateral Step Up with Counter Support  - 1 x daily - 4-5 x weekly - 3 sets - 10 reps - Standing Tandem Balance with Counter Support  - 1 x daily - 7 x weekly - 2 sets - 5 reps - 30 sec hold - Side Stepping with Resistance at Ankles and Counter Support  - 1 x daily - 7 x weekly - 1 sets - 5 reps - Forward Monster Walk with Resistance at Ankles and Counter Support  - 1 x daily - 7 x weekly - 1 sets - 5 reps - Standing Median Nerve Glide  - 1 x daily - 7 x weekly - 1 sets - 5-10 reps - 10 sec hold - Standing Ulnar Nerve Glide  - 1 x daily - 7 x weekly - 1 sets - 5-10 reps - 10 sec hold  Nerve flossing exercises given with a ball. Drawn picture and gave to patient.   GOALS: Goals reviewed with patient? Yes  SHORT TERM GOALS: Target date: 03/04/2022  Pt will be able to perform 5x sit to stand <30  seconds to improve functional strength without use of UE for support Baseline: 47 sec without use of UE support; 16s no UE  Goal status: MET  2.  Pt will report compliance with walking program and report at least 15 min of walking with st. Cane or no AD to improve walking endurance Baseline: <5 min with cane (eval); 15 mins with SPC Goal status: MET  3.  Pt will be able to go up and down steps with reciprocal gait pattern with use of 1-2  UE support to improve functional strength in LE Baseline: step to pattern must use bil hand rail (eval); intermittent B UE/U UE reciprocal pattern Goal status: MET   LONG TERM GOALS: Target date: 04/01/2022  Pt will be able to perform 5x sit to stand <20 seconds without use of UE support to improve functionals strength with transfers. Baseline: 47 sec without use of UE (eval) Goal status: INITIAL  2.  Pt will demo gait speed of >73ms with or without using st. Cane to improve functional ambulation and decrease fall risk Baseline: 0.48 m/s with st. Cane (eval) Goal status: INITIAL  3.  Pt will demo >20/30 on FGA to improve functional balance with walking. Baseline: 5/30 (eval) Goal status: INITIAL  4.  Pt will be able to ambulate for 30 min with or without cane to improve walking endurance for community activities. Baseline: <5 min with cane (eval) Goal status: INITIAL  5.  Pt will demo 20 sec improvement on her Modified CTSIB to improve functional standing balance and proprioception. Baseline: 107.3/120 sec (8/16) Goal status: IN PROGRESS   ASSESSMENT:  CLINICAL IMPRESSION: Today's skilled session continued to  focus on gait with no device and gait on various outdoor surfaces with cane with no significant issues noted. Increased assist for balance needed with compliant outdoor surfaces. Remainder of session continued to focus on balance training on compliant surfaces and stepping strategies. No issues noted or reported in session. The pt is making steady progress and should benefit from continued PT to progress toward unmet goals.    OBJECTIVE IMPAIRMENTS Abnormal gait, decreased activity tolerance, decreased balance, decreased endurance, decreased mobility, difficulty walking, decreased ROM, decreased strength, hypomobility, increased fascial restrictions, increased muscle spasms, impaired flexibility, impaired UE functional use, improper body mechanics, postural dysfunction, and  pain.   ACTIVITY LIMITATIONS carrying, lifting, bending, standing, squatting, stairs, and transfers  PARTICIPATION LIMITATIONS: meal prep, cleaning, laundry, driving, shopping, community activity, and yard work  PERSONAL FACTORS Past/current experiences, Time since onset of injury/illness/exacerbation, and 1 comorbidity: C4-6 fusion  are also affecting patient's functional outcome.   REHAB POTENTIAL: Good  CLINICAL DECISION MAKING: Stable/uncomplicated  EVALUATION COMPLEXITY: Low  PLAN: PT FREQUENCY: 2x/week  PT DURATION: 6 weeks  PLANNED INTERVENTIONS: Therapeutic exercises, Therapeutic activity, Neuromuscular re-education, Balance training, Gait training, Patient/Family education, Self Care, Joint mobilization, Stair training, Orthotic/Fit training, Dry Needling, Electrical stimulation, Spinal mobilization, Cryotherapy, Moist heat, Manual therapy, and Re-evaluation  PLAN FOR NEXT SESSION:  Continue to work on gait with no device adding in dynamic components, gait outdoors on compliant surfaces with cane vs no device, balance on compliant surfaces progressing to vision removed as sensation continues to return, single leg stance tasks    KWillow Ora PTA, [id number] SWestvilleGRenner [place] [phone]/21/23, 4:10 PM

## 2022-03-22 NOTE — Therapy (Signed)
OUTPATIENT OCCUPATIONAL THERAPY NEURO EVALUATION  Patient Name: Amy Bentley MRN: [id number] DOB:[birth year], 51 y.o., female Today's Date: 03/23/2022  PCP: Thayer Ohm, PA REFERRING PROVIDER: Dawley, Theodoro Doing, DO   OT End of Session - 03/23/22 0729     Visit Number 3    Number of Visits 17    Date for OT Re-Evaluation 06/07/22    Authorization Type [employer] 2023  (Same day 1 copay)  No Ded, VL: Unlimited  Auth Reqd: No    OT Start Time 0723    OT Stop Time 0801    OT Time Calculation (min) 38 min    Activity Tolerance Patient tolerated treatment well    Behavior During Therapy [place] for tasks assessed/performed               Past Medical History:  Diagnosis Date   colon ca dx'd 09/2013   colon   GERD (gastroesophageal reflux disease)    Hyperlipidemia    Morbid obesity ([place])    Past Surgical History:  Procedure Laterality Date   ANTERIOR CERVICAL DECOMP/DISCECTOMY FUSION N/A 01/30/2022   Procedure: Anterior Cervical Decompression/Discectomy Fusion Cervical Four-Five, Cervical Five-Six;  Surgeon: Karsten Ro, DO;  Location: [place];  Service: Neurosurgery;  Laterality: N/A;  Anterior Cervical Decompression/Discectomy Fusion Cervical Four-Five, Cervical Five-Six   BREAST BIOPSY Right    stereo    CESAREAN SECTION     x 2   COLON RESECTION N/A 10/20/2013   Procedure: OPEN SIGMOID COLONECTOMY COLOVESICAL FISTULA REPAIR;  Surgeon: Stark Klein, MD;  [facility];  Service: General;  Laterality: N/A;   COLONOSCOPY  08/16/2014   COLONOSCOPY WITH PROPOFOL N/A 08/16/2014   Procedure: COLONOSCOPY WITH PROPOFOL;  Surgeon: Milus Banister, MD;  [facility];  Service: Endoscopy;  Laterality: N/A;   COLONOSCOPY WITH PROPOFOL N/A 12/23/2017   Procedure: COLONOSCOPY WITH PROPOFOL;  Surgeon: Milus Banister, MD;  [facility];  Service: Endoscopy;  Laterality: N/A;   CYSTECTOMY N/A 10/20/2013   Procedure: CYSTECTOMY PARTIAL;  Surgeon: Ardis Hughs, MD;  [facility];  Service: Urology;  Laterality: N/A;   HERNIA REPAIR     LAPAROSCOPIC GASTRIC SLEEVE RESECTION N/A 12/04/2019   Procedure: LAPAROSCOPIC GASTRIC SLEEVE RESECTION, Upper Endo, Eras Pathway;  Surgeon: Clovis Riley, MD;  [facility];  Service: General;  Laterality: N/A;   PORTACATH PLACEMENT N/A 11/15/2013   Procedure: INSERTION PORT-A-CATH;  Surgeon: Stark Klein, MD;  [facility];  Service: General;  Laterality: N/A;   TUBAL LIGATION     UPPER GI ENDOSCOPY N/A 12/04/2019   Procedure: UPPER GI ENDOSCOPY;  Surgeon: Clovis Riley, MD;  [facility];  Service: General;  Laterality: N/A;   VENTRAL HERNIA REPAIR N/A 07/21/2017   Procedure: EXPLORATORY LAPAROTOMY AND LYSIS OF ADHESIONS WITH  REPAIR OF VENTRAL HERNIA WITH MESH ;  Surgeon: Excell Seltzer, MD;  [facility];  Service: General;  Laterality: N/A;   Patient Active Problem List   Diagnosis Date Noted   S/P cervical discectomy 01/30/2022   Cervical myelopathy ([place]) 01/30/2022   Morbid obesity ([place])    History of colon cancer    S/P laparoscopic procedure 07/21/2017   Incisional hernia, reducible 07/16/2017   Nausea and vomiting 07/12/2017   Malignant neoplasm of sigmoid colon ([place]) 03/13/2014   Family history of malignant neoplasm of gastrointestinal tract 03/13/2014   Cancer of sigmoid colon, invading bladder, T4N0, s/p en bloc resection 10/20/2013 11/06/2013   Protein-calorie malnutrition,  severe ([place]) 10/19/2013   Iron deficiency anemia, multifactorial with GI/GU and menstrual losses contributory 10/18/2013   Diverticulitis of colon (without mention of hemorrhage)(562.11) 10/18/2013   Weight loss 10/17/2013   Altered mental status 08/07/2013   UTI (lower urinary tract infection) 08/07/2013   Hypokalemia 08/07/2013   Anemia 08/07/2013    ONSET DATE: symptoms since June, s/p C4-6 ACDF on 01/30/22.   REFERRING DIAG: G95.9 (ICD-10-CM) - Cervical myelopathy ([place])   THERAPY  DIAG:  Muscle weakness (generalized)  Other lack of coordination  Stiffness of left hand, not elsewhere classified  Localized edema  Other disturbances of skin sensation  Unsteadiness on feet  Rationale for Evaluation and Treatment Rehabilitation  SUBJECTIVE:   SUBJECTIVE STATEMENT: It rained Saturday and my hands have been hurting since...  wasn't able to do much over the weekend (cooking or cleaning).    Pt accompanied by: self  PERTINENT HISTORY: cervical myelopathy with symptoms since June, but s/p C4-6 ACDF on 01/30/22.   Other PMH:  PMH:  Colon CA 2015 (s/p chemo and colon resection), hyperlipidemia, GERD, hx of hernia repair, hx of laparoscopic gastric sleeve resection  PRECAUTIONS: Fall; bending, stooping, no lifting more than 5lbs, 03/19/22--now driving   WEIGHT BEARING RESTRICTIONS No  PAIN:  Are you having pain? Yes: NPRS scale: 10/10 Pain location: hands (3rd-4th digits are the worst) Pain description: burning, tingling, numbness, itchy, swell Aggravating factors: use, rain Relieving factors: pain meds   FALLS: Has patient fallen in last 6 months? Yes. 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  Tendon glides x10 to both hands for incr ROM/decr stiffness  Fludio x 29mn to both hands for pain, stiffness, desensitization with no adverse reactions.    In sitting/standing, functional reaching to place clothespins with 1-8lb resistance on  vertical pole for incr hand strength.  Arm bike x 520m (forward/backward) for reciprocal movement/conditioning without rest.   Placing grooved pegs in pegboard with each hand with min for incr in-hand manipulation, removing 3 at a time to drop 1 at a time.   Cane ex for shoulder flex and abduction to each side x10, followed by shoulder ext with scapular retraction x10.      HOME EXERCISE PROGRAM: At eval:  recommended pt perform gentle ROM on 4-5th digits of L hand (in flex) to prevent stiffness 03/19/22:  Coordination HEP, [place] HEP,  L hand tendon glides    GOALS: Potential Goals reviewed with patient? Yes  SHORT TERM GOALS: Target date: 04/07/22  Pt will be independent with HEP for bilateral hand ROM, coordination, and strength. Goal status: INITIAL  2.  Pt will improve R hand coordination for ADLs as shown by completing 9-hole peg test in less than 28sec. Baseline: 33.53sec Goal status: INITIAL  3.  Pt will improve L hand coordination for ADLs as shown by completing 9-hole peg test in less than 23sec. Baseline:  27.68sec Goal status: INITIAL  4.  Pt will improve R grip strength by at least 8lbs for opening containers and lifting  objects. Baseline:  27lbs Goal status: INITIAL  5.  Pt will improve L grip strength by at least 8lbs for opening containers and lifting objects. Baseline:  18.7lbs Goal status: INITIAL  6.  Pt will report incr ease with writing and chopping food (using AE/strategies prn). Goal status: INITIAL  7.  Pt will demo L hand gross finger flex WNL.    Goal status:  INITIAL  LONG TERM GOALS: Target date: 05/07/22, 06/06/22  Pt will be independent with updated HEP for proximal UE strength (once cleared by physician). Goal status: INITIAL  2.  Pt will improve R hand coordination for ADLs as shown by completing 9-hole peg test in less than 23sec. Baseline: 33.53sec Goal status: INITIAL  3.  Pt will improve L grip strength by at least 15lbs for  opening containers and lifting objects. Baseline:  18.7lbs Goal status: INITIAL  4.  Pt will verbalize understanding of ways manage bilateral hand pain. Goal status: INITIAL  5.  Pt will be able to use both hands to don jeans and fasten. Goal status: INITIAL  ASSESSMENT:  CLINICAL IMPRESSION: Pt is progressing towards goals with incr IADL performance, hand pain remains a limiting factor.  Pt reports improved pain after Fludio (5/10 pain reported after fludio).   PERFORMANCE DEFICITS in functional skills including ADLs, IADLs, coordination, dexterity, sensation, edema, ROM, strength, pain, FMC, GMC, mobility, balance, decreased knowledge of precautions, decreased knowledge of use of DME, and UE functional use, cognitive skills   IMPAIRMENTS are limiting patient from ADLs, IADLs, and leisure.   COMORBIDITIES may have co-morbidities  that affects occupational performance. Patient will benefit from skilled OT to address above impairments and improve overall function.  MODIFICATION OR ASSISTANCE TO COMPLETE EVALUATION: No modification of tasks or assist necessary to complete an evaluation.  OT OCCUPATIONAL PROFILE AND HISTORY: Detailed assessment: Review of records and additional review of physical, cognitive, psychosocial history related to current functional performance.  CLINICAL DECISION MAKING: Moderate - several treatment options, min-mod task modification necessary  REHAB POTENTIAL: Good  EVALUATION COMPLEXITY: Moderate    PLAN: OT FREQUENCY: 2x/week  OT DURATION:  eval +16  sessions over 12 weeks due to insurance/scheduling needs  PLANNED INTERVENTIONS: self care/ADL training, therapeutic exercise, therapeutic activity, neuromuscular re-education, manual therapy, passive range of motion, balance training, functional mobility training, aquatic therapy, splinting, electrical stimulation, ultrasound, paraffin, fluidotherapy, moist heat, cryotherapy, patient/family education,  energy conservation, and DME and/or AE instructions  RECOMMENDED OTHER SERVICES: current with PT  CONSULTED AND AGREED WITH PLAN OF CARE: Patient  PLAN FOR NEXT SESSION:  review HEP prn, Fludio prn, continue to address hand strength and coordination.    Maron Stanzione, OTR/L 03/23/2022, 8:18 AM

## 2022-03-23 ENCOUNTER — Encounter: Payer: Self-pay | Admitting: Occupational Therapy

## 2022-03-23 ENCOUNTER — Encounter: Payer: Managed Care, Other (non HMO) | Admitting: Occupational Therapy

## 2022-03-23 ENCOUNTER — Ambulatory Visit: Payer: Managed Care, Other (non HMO) | Admitting: Occupational Therapy

## 2022-03-23 ENCOUNTER — Ambulatory Visit: Payer: Managed Care, Other (non HMO) | Admitting: Physical Therapy

## 2022-03-23 DIAGNOSIS — R278 Other lack of coordination: Secondary | ICD-10-CM

## 2022-03-23 DIAGNOSIS — R2681 Unsteadiness on feet: Secondary | ICD-10-CM

## 2022-03-23 DIAGNOSIS — M6281 Muscle weakness (generalized): Secondary | ICD-10-CM

## 2022-03-23 DIAGNOSIS — M25642 Stiffness of left hand, not elsewhere classified: Secondary | ICD-10-CM

## 2022-03-23 DIAGNOSIS — R208 Other disturbances of skin sensation: Secondary | ICD-10-CM

## 2022-03-23 DIAGNOSIS — R2689 Other abnormalities of gait and mobility: Secondary | ICD-10-CM | POA: Diagnosis not present

## 2022-03-23 DIAGNOSIS — R600 Localized edema: Secondary | ICD-10-CM

## 2022-03-26 ENCOUNTER — Encounter: Payer: Managed Care, Other (non HMO) | Admitting: Occupational Therapy

## 2022-03-26 ENCOUNTER — Ambulatory Visit: Payer: Managed Care, Other (non HMO) | Admitting: Physical Therapy

## 2022-03-27 ENCOUNTER — Ambulatory Visit: Payer: Managed Care, Other (non HMO) | Admitting: Physical Therapy

## 2022-03-27 DIAGNOSIS — R278 Other lack of coordination: Secondary | ICD-10-CM

## 2022-03-27 DIAGNOSIS — M6281 Muscle weakness (generalized): Secondary | ICD-10-CM

## 2022-03-27 DIAGNOSIS — R2681 Unsteadiness on feet: Secondary | ICD-10-CM

## 2022-03-27 DIAGNOSIS — R2689 Other abnormalities of gait and mobility: Secondary | ICD-10-CM

## 2022-03-27 NOTE — Therapy (Signed)
OUTPATIENT PHYSICAL THERAPY NEURO TREATMENT-PROGRESS NOTE   Patient Name: Atiyana Bentley MRN: [id number] DOB:Oct 10, 1970, 51 y.o., female Today's Date: 03/27/2022   PCP: Amy Ohm, PA REFERRING PROVIDER: Dawley, Theodoro Doing, DO    PT End of Session - 03/27/22 0802     Visit Number 10    Number of Visits 17    Date for PT Re-Evaluation 04/01/22    Authorization Type [employer]; $35 copay (Same day 1 copay)  No Ded  OPM $2500/$5000 - $1159 Met  VL: Unlimited    PT Start Time 0800    PT Stop Time 0845    PT Time Calculation (min) 45 min    Equipment Utilized During Treatment Gait belt    Activity Tolerance Patient tolerated treatment well    Behavior During Therapy WFL for tasks assessed/performed             Physical Therapy Progress Note   Dates of Reporting Period:02/04/2022 - 03/27/2022  See Note below for Objective Data and Assessment of Progress/Goals.  Thank you for the referral of this patient. Amy Bentley, PT, DPT, CSRS      Past Medical History:  Diagnosis Date   colon ca dx'd 09/2013   colon   GERD (gastroesophageal reflux disease)    Hyperlipidemia    Morbid obesity [facility])    Past Surgical History:  Procedure Laterality Date   ANTERIOR CERVICAL DECOMP/DISCECTOMY FUSION N/A 01/30/2022   Procedure: Anterior Cervical Decompression/Discectomy Fusion Cervical Four-Five, Cervical Five-Six;  Surgeon: Amy Ro, DO;  Location: [place];  Service: Neurosurgery;  Laterality: N/A;  Anterior Cervical Decompression/Discectomy Fusion Cervical Four-Five, Cervical Five-Six   BREAST BIOPSY Right    stereo    CESAREAN SECTION     x 2   COLON RESECTION N/A 10/20/2013   Procedure: OPEN SIGMOID COLONECTOMY COLOVESICAL FISTULA REPAIR;  Surgeon: Amy Klein, MD;  [facility];  Service: General;  Laterality: N/A;   COLONOSCOPY  08/16/2014   COLONOSCOPY WITH PROPOFOL N/A 08/16/2014   Procedure: COLONOSCOPY WITH PROPOFOL;  Surgeon: Amy Banister, MD;  [facility];  Service: Endoscopy;  Laterality: N/A;   COLONOSCOPY WITH PROPOFOL N/A 12/23/2017   Procedure: COLONOSCOPY WITH PROPOFOL;  Surgeon: Amy Banister, MD;  [facility];  Service: Endoscopy;  Laterality: N/A;   CYSTECTOMY N/A 10/20/2013   Procedure: CYSTECTOMY PARTIAL;  Surgeon: Amy Hughs, MD;  [facility];  Service: Urology;  Laterality: N/A;   HERNIA REPAIR     LAPAROSCOPIC GASTRIC SLEEVE RESECTION N/A 12/04/2019   Procedure: LAPAROSCOPIC GASTRIC SLEEVE RESECTION, Upper Endo, Eras Pathway;  Surgeon: Amy Riley, MD;  [facility];  Service: General;  Laterality: N/A;   PORTACATH PLACEMENT N/A 11/15/2013   Procedure: INSERTION PORT-A-CATH;  Surgeon: Amy Klein, MD;  [facility];  Service: General;  Laterality: N/A;   TUBAL LIGATION     UPPER GI ENDOSCOPY N/A 12/04/2019   Procedure: UPPER GI ENDOSCOPY;  Surgeon: Amy Riley, MD;  [facility];  Service: General;  Laterality: N/A;   VENTRAL HERNIA REPAIR N/A 07/21/2017   Procedure: EXPLORATORY LAPAROTOMY AND LYSIS OF ADHESIONS WITH  REPAIR OF VENTRAL HERNIA WITH MESH ;  Surgeon: Amy Seltzer, MD;  [facility];  Service: General;  Laterality: N/A;   Patient Active Problem List   Diagnosis Date Noted   S/P cervical discectomy 01/30/2022   Cervical myelopathy ([place]) 01/30/2022   Morbid obesity ([place])    History of colon cancer  S/P laparoscopic procedure 07/21/2017   Incisional hernia, reducible 07/16/2017   Nausea and vomiting 07/12/2017   Malignant neoplasm of sigmoid colon ([place]) 03/13/2014   Family history of malignant neoplasm of gastrointestinal tract 03/13/2014   Cancer of sigmoid colon, invading bladder, T4N0, s/p en bloc resection 10/20/2013 11/06/2013   Protein-calorie malnutrition, severe ([place]) 10/19/2013   Iron deficiency anemia, multifactorial with GI/GU and menstrual losses contributory 10/18/2013   Diverticulitis of colon (without mention of hemorrhage)(562.11)  10/18/2013   Weight loss 10/17/2013   Altered mental status 08/07/2013   UTI (lower urinary tract infection) 08/07/2013   Hypokalemia 08/07/2013   Anemia 08/07/2013    ONSET DATE: 10/01/21  REFERRING DIAG: G95.9 (ICD-10-CM) - Cervical myelopathy ([facility])   THERAPY DIAG:  Muscle weakness (generalized)  Other lack of coordination  Unsteadiness on feet  Other abnormalities of gait and mobility  Rationale for Evaluation and Treatment Rehabilitation  PERTINENT HISTORY: C4-6 fusion, hx of colon cancer for which she had surgery with resection of part of colon and part of her bladder.  PRECAUTIONS: None; cervical precautions lifted 9/4  WEIGHT BEARING RESTRICTIONS No  FALLS: Has patient fallen in last 6 months? Yes. Number of falls 2 falls prior to surgery due to legs giving away.  PATIENT GOALS get the feeling back, get the mobility back in my hands and feet                                                                                                                                                                                          SUBJECTIVE STATEMENT: Pt reports she has 3/10 pain in hands (nerve pain) this AM. Pt reports no falls or near falls, no big changes since last time. Pt reports she has been driving to/from her therapy appointments and that has been Bentley well. Pt states, "I just want the feeling back"--improving in parts of her legs. Pt reports using no AD in the home and just brings [facility] into the community because it feels safer.  Pt accompanied by: self  PAIN:  Are you having pain? Yes: NPRS scale: 3/10 Pain location: bil hands Pain description: pins and needles Aggravating factors: varied movements at random times Relieving factors: pain medication (oxy or tylenol, trying to wean off oxy)     TODAY'S TREATMENT: THER EX: Elliptical x 2.5 min forwards at level 2.0, decreased knee control noted bilaterally, legs feel "rubbery" following exercise  THER  ACT: Static stance on airex in // bars: -normal stance EC x 30 sec -Romberg stance EC x 30 sec -modified tandem L/R EC x 30 sec (most difficulty with LLE fwd)  Static stance on  airex with Romberg stance performing ball toss (CGA for balance with occasional use of UE support on back of chair to recover balance) -LOB anteriorly then posteriorly when having to look up  Modified L/R tandem stance on airex with ball toss (min A for balance due to frequent LOB) -most difficulty reaching OH and out to the R  Gait x 230 ft with CGA and no AD while performing ball toss, no LOB noted, minor path deviation  Standing alt L/R cone taps with semi-circle of cones set in front of patient; v/c for LE and color of cone. Pt requires min A for standing balance, exhibits most difficulty controlling RLE. Transitioned to gumdrop taps with RLE but task it not challenging enough for patient. Pt exhibits improved ability to accurately perform task when she performs it in a slow and controlled manner.    PATIENT EDUCATION: Education details: continue HEP Person educated: Patient Education method: Explanation Education comprehension: verbalized understanding   HOME EXERCISE PROGRAM: Walking Program: Walk with your family. Walk during cooler times of the day. Starting next week, Walk 5 min per day for 7 days. Following week add 5 minutes to your total time. Week 1 10 min Week 2: 15 min Week 3: 20 min Week 4: 25 min...Marland KitchenMarland KitchenUntil you can walk to 30 min per day   Practice sit to stand: 2 x 10 without use of your arms on sturdy chair (put chair against table or wall to prevent it from sliding).   Access Code: [email or web address] URL: [email or web address] Date: 02/11/2022 Prepared by: Amy Bentley  Exercises - Forward Step Up with Counter Support  - 1 x daily - 4-5 x weekly - 3 sets - 10 reps - Lateral Step Up with Counter Support  - 1 x daily - 4-5 x weekly - 3 sets - 10 reps - Standing Tandem  Balance with Counter Support  - 1 x daily - 7 x weekly - 2 sets - 5 reps - 30 sec hold - Side Stepping with Resistance at Ankles and Counter Support  - 1 x daily - 7 x weekly - 1 sets - 5 reps - Forward Monster Walk with Resistance at Ankles and Counter Support  - 1 x daily - 7 x weekly - 1 sets - 5 reps - Standing Median Nerve Glide  - 1 x daily - 7 x weekly - 1 sets - 5-10 reps - 10 sec hold - Standing Ulnar Nerve Glide  - 1 x daily - 7 x weekly - 1 sets - 5-10 reps - 10 sec hold  Nerve flossing exercises given with a ball. Drawn picture and gave to patient.   GOALS: Goals reviewed with patient? Yes  SHORT TERM GOALS: Target date: 03/04/2022  Pt will be able to perform 5x sit to stand <30 seconds to improve functional strength without use of UE for support Baseline: 47 sec without use of UE support; 16s no UE  Goal status: MET  2.  Pt will report compliance with walking program and report at least 15 min of walking with st. Cane or no AD to improve walking endurance Baseline: <5 min with cane (eval); 15 mins with SPC Goal status: MET  3.  Pt will be able to go up and down steps with reciprocal gait pattern with use of 1-2 UE support to improve functional strength in LE Baseline: step to pattern must use bil hand rail (eval); intermittent B UE/U UE reciprocal pattern Goal status: MET   LONG TERM GOALS:  Target date: 04/01/2022  Pt will be able to perform 5x sit to stand <20 seconds without use of UE support to improve functionals strength with transfers. Baseline: 47 sec without use of UE (eval) Goal status: INITIAL  2.  Pt will demo gait speed of >52ms with or without using st. Cane to improve functional ambulation and decrease fall risk Baseline: 0.48 m/s with st. Cane (eval) Goal status: INITIAL  3.  Pt will demo >20/30 on FGA to improve functional balance with walking. Baseline: 5/30 (eval) Goal status: INITIAL  4.  Pt will be able to ambulate for 30 min with or without cane  to improve walking endurance for community activities. Baseline: <5 min with cane (eval) Goal status: INITIAL  5.  Pt will demo 20 sec improvement on her Modified CTSIB to improve functional standing balance and proprioception. Baseline: 107.3/120 sec (8/16) Goal status: IN PROGRESS   ASSESSMENT:  CLINICAL IMPRESSION: Emphasis of skilled PT session on continuing to work on static and dynamic balance with addition of EC challenge this date. Pt exhibits most difficulty maintaining modified Romberg stance this date with LLE forwards due to decreased sensation in RLE as compared to LLE. Pt also exhibits increased difficulty when reaching out to the R and OH for targets with up to min A needed to maintain standing balance. Pt also continues to exhibit decreased control of RLE as compared to LLE when tapping smaller, elevated targets of cones vs gumdrops. Pt continues to benefit from skilled therapy services to address ongoing gait and balance impairments. Continue POC.    OBJECTIVE IMPAIRMENTS Abnormal gait, decreased activity tolerance, decreased balance, decreased endurance, decreased mobility, difficulty walking, decreased ROM, decreased strength, hypomobility, increased fascial restrictions, increased muscle spasms, impaired flexibility, impaired UE functional use, improper body mechanics, postural dysfunction, and pain.   ACTIVITY LIMITATIONS carrying, lifting, bending, standing, squatting, stairs, and transfers  PARTICIPATION LIMITATIONS: meal prep, cleaning, laundry, driving, shopping, community activity, and yard work  PERSONAL FACTORS Past/current experiences, Time since onset of injury/illness/exacerbation, and 1 comorbidity: C4-6 fusion  are also affecting patient's functional outcome.   REHAB POTENTIAL: Good  CLINICAL DECISION MAKING: Stable/uncomplicated  EVALUATION COMPLEXITY: Low  PLAN: PT FREQUENCY: 2x/week  PT DURATION: 6 weeks  PLANNED INTERVENTIONS: Therapeutic  exercises, Therapeutic activity, Neuromuscular re-education, Balance training, Gait training, Patient/Family education, Self Care, Joint mobilization, Stair training, Orthotic/Fit training, Dry Needling, Electrical stimulation, Spinal mobilization, Cryotherapy, Moist heat, Manual therapy, and Re-evaluation  PLAN FOR NEXT SESSION:  Continue to work on gait with no device adding in dynamic components, gait outdoors on compliant surfaces with cane vs no device, balance on compliant surfaces progressing to vision removed as sensation continues to return, single leg stance tasks, SciFit vs elliptical    TExcell Bentley PT, DPT, [id number] [email or web address]  [phone] 03/27/22, 8:47 AM

## 2022-03-29 NOTE — Therapy (Signed)
OUTPATIENT OCCUPATIONAL THERAPY NEURO EVALUATION  Patient Name: Amy Bentley MRN: [id number] DOB:04/21/1971, 51 y.o., female Today's Date: 03/30/2022  PCP: Thayer Ohm, PA REFERRING PROVIDER: Dawley, Theodoro Doing, DO   OT End of Session - 03/30/22 0755     Visit Number 4    Number of Visits 17    Date for OT Re-Evaluation 06/07/22    Authorization Type [employer] 2023  (Same day 1 copay)  No Ded, VL: Unlimited  Auth Reqd: No    OT Start Time 0808    OT Stop Time 0847    OT Time Calculation (min) 39 min    Activity Tolerance Patient tolerated treatment well    Behavior During Therapy [facility] for tasks assessed/performed                Past Medical History:  Diagnosis Date   colon ca dx'd 09/2013   colon   GERD (gastroesophageal reflux disease)    Hyperlipidemia    Morbid obesity ([place])    Past Surgical History:  Procedure Laterality Date   ANTERIOR CERVICAL DECOMP/DISCECTOMY FUSION N/A 01/30/2022   Procedure: Anterior Cervical Decompression/Discectomy Fusion Cervical Four-Five, Cervical Five-Six;  Surgeon: Karsten Ro, DO;  Location: [place];  Service: Neurosurgery;  Laterality: N/A;  Anterior Cervical Decompression/Discectomy Fusion Cervical Four-Five, Cervical Five-Six   BREAST BIOPSY Right    stereo    CESAREAN SECTION     x 2   COLON RESECTION N/A 10/20/2013   Procedure: OPEN SIGMOID COLONECTOMY COLOVESICAL FISTULA REPAIR;  Surgeon: Stark Klein, MD;  Location: [facility];  Service: General;  Laterality: N/A;   COLONOSCOPY  08/16/2014   COLONOSCOPY WITH PROPOFOL N/A 08/16/2014   Procedure: COLONOSCOPY WITH PROPOFOL;  Surgeon: Milus Banister, MD;  Location: [facility];  Service: Endoscopy;  Laterality: N/A;   COLONOSCOPY WITH PROPOFOL N/A 12/23/2017   Procedure: COLONOSCOPY WITH PROPOFOL;  Surgeon: Milus Banister, MD;  Location: [facility];  Service: Endoscopy;  Laterality: N/A;   CYSTECTOMY N/A 10/20/2013   Procedure: CYSTECTOMY PARTIAL;  Surgeon: Ardis Hughs, MD;  Location: [facility];  Service: Urology;  Laterality: N/A;   HERNIA REPAIR     LAPAROSCOPIC GASTRIC SLEEVE RESECTION N/A 12/04/2019   Procedure: LAPAROSCOPIC GASTRIC SLEEVE RESECTION, Upper Endo, Eras Pathway;  Surgeon: Clovis Riley, MD;  Location: [facility];  Service: General;  Laterality: N/A;   PORTACATH PLACEMENT N/A 11/15/2013   Procedure: INSERTION PORT-A-CATH;  Surgeon: Stark Klein, MD;  Location: [facility];  Service: General;  Laterality: N/A;   TUBAL LIGATION     UPPER GI ENDOSCOPY N/A 12/04/2019   Procedure: UPPER GI ENDOSCOPY;  Surgeon: Clovis Riley, MD;  Location: [facility];  Service: General;  Laterality: N/A;   VENTRAL HERNIA REPAIR N/A 07/21/2017   Procedure: EXPLORATORY LAPAROTOMY AND LYSIS OF ADHESIONS WITH  REPAIR OF VENTRAL HERNIA WITH MESH ;  Surgeon: Excell Seltzer, MD;  Location: [facility];  Service: General;  Laterality: N/A;   Patient Active Problem List   Diagnosis Date Noted   S/P cervical discectomy 01/30/2022   Cervical myelopathy ([place]) 01/30/2022   Morbid obesity ([place])    History of colon cancer    S/P laparoscopic procedure 07/21/2017   Incisional hernia, reducible 07/16/2017   Nausea and vomiting 07/12/2017   Malignant neoplasm of sigmoid colon ([place]) 03/13/2014   Family history of malignant neoplasm of gastrointestinal tract 03/13/2014   Cancer of sigmoid colon, invading bladder, T4N0, s/p en bloc resection 10/20/2013 11/06/2013   Protein-calorie  malnutrition, severe ([place]) 10/19/2013   Iron deficiency anemia, multifactorial with GI/GU and menstrual losses contributory 10/18/2013   Diverticulitis of colon (without mention of hemorrhage)(562.11) 10/18/2013   Weight loss 10/17/2013   Altered mental status 08/07/2013   UTI (lower urinary tract infection) 08/07/2013   Hypokalemia 08/07/2013   Anemia 08/07/2013    ONSET DATE: symptoms since June, s/p C4-6 ACDF on 01/30/22.   REFERRING DIAG: G95.9 (ICD-10-CM) - Cervical myelopathy ([place])   THERAPY  DIAG:  Other lack of coordination  Muscle weakness (generalized)  Unsteadiness on feet  Other abnormalities of gait and mobility  Stiffness of left hand, not elsewhere classified  Localized edema  Other disturbances of skin sensation  Rationale for Evaluation and Treatment Rehabilitation  SUBJECTIVE:   SUBJECTIVE STATEMENT: Pt reports that MD incr medications for nerve pain, but d/c all restrictions.      Pt accompanied by: self  PERTINENT HISTORY: cervical myelopathy with symptoms since June, but s/p C4-6 ACDF on 01/30/22.   Other PMH:  PMH:  Colon CA 2015 (s/p chemo and colon resection), hyperlipidemia, GERD, hx of hernia repair, hx of laparoscopic gastric sleeve resection  PRECAUTIONS: Fall;  Pt reports all restrictions d/c at last MD appt 03/23/22  (including lifting)   WEIGHT BEARING RESTRICTIONS No  PAIN:  Are you having pain? Yes: NPRS scale: 4-5/10 Pain location: hands (3rd-4th digits are the worst) Pain description: burning, tingling, numbness, itchy, swell Aggravating factors: use, rain Relieving factors: pain meds   FALLS: Has patient fallen in last 6 months? Yes. 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  Removing by using in-hand manipulation with each hand.  Picking up blocks with gripper set on level 2 (black spring) for sustained grip strength with R hand with mod difficulty.  Then  with L hand with mod-max difficulty, decr to level 1 after approx 1/2 blocks.    Cane ex for shoulder flex and abduction to each side x10, followed by shoulder ext with scapular retraction x10.   Placing [place] pegs in pegboard with tweezers with min-mod difficulty for incr coordination/pinch strength with each hand    PATIENT EDUCATION: Education details: Yellow theraband HEP--see pt instructions Person educated: Patient Education method: Explanation, Demonstration, Verbal cues, and Handouts Education comprehension: verbalized understanding and returned demonstration   HOME EXERCISE PROGRAM: At eval:  recommended pt perform gentle ROM on 4-5th digits of L hand (in flex) to prevent stiffness 03/19/22:  Coordination HEP, [place]ty HEP,  L hand tendon glides    GOALS: Potential Goals reviewed with patient? Yes  SHORT TERM GOALS: Target date: 04/07/22  Pt will be independent with HEP for bilateral hand ROM, coordination, and strength. Goal status: INITIAL  2.  Pt will improve R hand coordination for ADLs as shown by completing 9-hole peg test in less than 28sec. Baseline: 33.53sec Goal status: INITIAL  3.  Pt will improve L hand coordination for ADLs as shown by completing 9-hole peg test in less  than 23sec. Baseline:  27.68sec Goal status: INITIAL  4.  Pt will improve R grip strength by at least 8lbs for opening containers and lifting objects. Baseline:  27lbs Goal status: INITIAL  5.  Pt will improve L grip strength by at least 8lbs for opening containers and lifting objects. Baseline:  18.7lbs Goal status: INITIAL  6.  Pt will report incr ease with writing and chopping food (using AE/strategies prn). Goal status: INITIAL  7.  Pt will demo L hand gross finger flex WNL.    Goal status:  INITIAL  LONG TERM GOALS: Target date: 05/07/22, 06/06/22  Pt will be independent with updated HEP for proximal UE strength (once cleared by physician). Goal status: INITIAL  2.   Pt will improve R hand coordination for ADLs as shown by completing 9-hole peg test in less than 23sec. Baseline: 33.53sec Goal status: INITIAL  3.  Pt will improve L grip strength by at least 15lbs for opening containers and lifting objects. Baseline:  18.7lbs Goal status: INITIAL  4.  Pt will verbalize understanding of ways manage bilateral hand pain. Goal status: INITIAL  5.  Pt will be able to use both hands to don jeans and fasten. Goal status: INITIAL  ASSESSMENT:  CLINICAL IMPRESSION:  Pt is progressing towards goals with improving hand strength and coordination, but  hand pain remains a limiting factor.    PERFORMANCE DEFICITS in functional skills including ADLs, IADLs, coordination, dexterity, sensation, edema, ROM, strength, pain, FMC, GMC, mobility, balance, decreased knowledge of precautions, decreased knowledge of use of DME, and UE functional use, cognitive skills   IMPAIRMENTS are limiting patient from ADLs, IADLs, and leisure.   COMORBIDITIES may have co-morbidities  that affects occupational performance. Patient will benefit from skilled OT to address above impairments and improve overall function.  MODIFICATION OR ASSISTANCE TO COMPLETE EVALUATION: No modification of tasks or assist necessary to complete an evaluation.  OT OCCUPATIONAL PROFILE AND HISTORY: Detailed assessment: Review of records and additional review of physical, cognitive, psychosocial history related to current functional performance.  CLINICAL DECISION MAKING: Moderate - several treatment options, min-mod task modification necessary  REHAB POTENTIAL: Good  EVALUATION COMPLEXITY: Moderate    PLAN: OT FREQUENCY: 2x/week  OT DURATION:  eval +16  sessions over 12 weeks due to insurance/scheduling needs  PLANNED INTERVENTIONS: self care/ADL training, therapeutic exercise, therapeutic activity, neuromuscular re-education, manual therapy, passive range of motion, balance training, functional  mobility training, aquatic therapy, splinting, electrical stimulation, ultrasound, paraffin, fluidotherapy, moist heat, cryotherapy, patient/family education, energy conservation, and DME and/or AE instructions  RECOMMENDED OTHER SERVICES: current with PT  CONSULTED AND AGREED WITH PLAN OF CARE: Patient  PLAN FOR NEXT SESSION:   Fludio prn, continue to address hand strength and coordination.  Pt now cleared from precautions   [facility], OTR/L 03/30/2022, 8:41 AM

## 2022-03-30 ENCOUNTER — Ambulatory Visit: Payer: Managed Care, Other (non HMO) | Attending: Neurological Surgery | Admitting: Physical Therapy

## 2022-03-30 ENCOUNTER — Ambulatory Visit: Payer: Managed Care, Other (non HMO) | Admitting: Occupational Therapy

## 2022-03-30 ENCOUNTER — Encounter: Payer: Self-pay | Admitting: Occupational Therapy

## 2022-03-30 DIAGNOSIS — R278 Other lack of coordination: Secondary | ICD-10-CM | POA: Insufficient documentation

## 2022-03-30 DIAGNOSIS — M25642 Stiffness of left hand, not elsewhere classified: Secondary | ICD-10-CM | POA: Insufficient documentation

## 2022-03-30 DIAGNOSIS — R2689 Other abnormalities of gait and mobility: Secondary | ICD-10-CM

## 2022-03-30 DIAGNOSIS — R2681 Unsteadiness on feet: Secondary | ICD-10-CM

## 2022-03-30 DIAGNOSIS — M6281 Muscle weakness (generalized): Secondary | ICD-10-CM | POA: Insufficient documentation

## 2022-03-30 DIAGNOSIS — R208 Other disturbances of skin sensation: Secondary | ICD-10-CM

## 2022-03-30 DIAGNOSIS — R600 Localized edema: Secondary | ICD-10-CM

## 2022-03-30 NOTE — Therapy (Signed)
OUTPATIENT PHYSICAL THERAPY NEURO TREATMENT-RECERTIFICATION   Patient Name: Amy Bentley MRN: [id number] DOB:1971/05/06, 51 y.o., female Today's Date: 03/30/2022   PCP: Thayer Ohm, PA REFERRING PROVIDER: Dawley, Theodoro Doing, DO    PT End of Session - 03/30/22 0933     Visit Number 11    Number of Visits 17    Date for PT Re-Evaluation 05/11/22   to allow for scheduling conflicts   Authorization Type [employer]; $35 copay (Same day 1 copay)  No Ded  OPM $2500/$5000 - $1159 Met  VL: Unlimited    Progress Note Due on Visit 20    PT Start Time 0932    PT Stop Time 1013    PT Time Calculation (min) 41 min    Equipment Utilized During Treatment Gait belt    Activity Tolerance Patient tolerated treatment well    Behavior During Therapy WFL for tasks assessed/performed                    Past Medical History:  Diagnosis Date   colon ca dx'd 09/2013   colon   GERD (gastroesophageal reflux disease)    Hyperlipidemia    Morbid obesity ([place])    Past Surgical History:  Procedure Laterality Date   ANTERIOR CERVICAL DECOMP/DISCECTOMY FUSION N/A 01/30/2022   Procedure: Anterior Cervical Decompression/Discectomy Fusion Cervical Four-Five, Cervical Five-Six;  Surgeon: Karsten Ro, DO;  Location: [facility];  Service: Neurosurgery;  Laterality: N/A;  Anterior Cervical Decompression/Discectomy Fusion Cervical Four-Five, Cervical Five-Six   BREAST BIOPSY Right    stereo    CESAREAN SECTION     x 2   COLON RESECTION N/A 10/20/2013   Procedure: OPEN SIGMOID COLONECTOMY COLOVESICAL FISTULA REPAIR;  Surgeon: Stark Klein, MD;  Location: [facility];  Service: General;  Laterality: N/A;   COLONOSCOPY  08/16/2014   COLONOSCOPY WITH PROPOFOL N/A 08/16/2014   Procedure: COLONOSCOPY WITH PROPOFOL;  Surgeon: Milus Banister, MD;  Location: [facility];  Service: Endoscopy;  Laterality: N/A;   COLONOSCOPY WITH PROPOFOL N/A 12/23/2017   Procedure: COLONOSCOPY WITH PROPOFOL;  Surgeon: Milus Banister, MD;  Location[facility];  Service: Endoscopy;  Laterality: N/A;   CYSTECTOMY N/A 10/20/2013   Procedure: CYSTECTOMY PARTIAL;  Surgeon: Ardis Hughs, MD;  Location: [facility];  Service: Urology;  Laterality: N/A;   HERNIA REPAIR     LAPAROSCOPIC GASTRIC SLEEVE RESECTION N/A 12/04/2019   Procedure: LAPAROSCOPIC GASTRIC SLEEVE RESECTION, Upper Endo, Eras Pathway;  Surgeon: Clovis Riley, MD;  Location: [facility];  Service: General;  Laterality: N/A;   PORTACATH PLACEMENT N/A 11/15/2013   Procedure: INSERTION PORT-A-CATH;  Surgeon: Stark Klein, MD;  Location: [facility];  Service: General;  Laterality: N/A;   TUBAL LIGATION     UPPER GI ENDOSCOPY N/A 12/04/2019   Procedure: UPPER GI ENDOSCOPY;  Surgeon: Clovis Riley, MD;  Location: [facility];  Service: General;  Laterality: N/A;   VENTRAL HERNIA REPAIR N/A 07/21/2017   Procedure: EXPLORATORY LAPAROTOMY AND LYSIS OF ADHESIONS WITH  REPAIR OF VENTRAL HERNIA WITH MESH ;  Surgeon: Excell Seltzer, MD;  Location: [facility];  Service: General;  Laterality: N/A;   Patient Active Problem List   Diagnosis Date Noted   S/P cervical discectomy 01/30/2022   Cervical myelopathy ([place]) 01/30/2022   Morbid obesity ([place])    History of colon cancer    S/P laparoscopic procedure 07/21/2017   Incisional hernia, reducible 07/16/2017   Nausea and vomiting 07/12/2017   Malignant neoplasm of  sigmoid colon ([place]) 03/13/2014   Family history of malignant neoplasm of gastrointestinal tract 03/13/2014   Cancer of sigmoid colon, invading bladder, T4N0, s/p en bloc resection 10/20/2013 11/06/2013   Protein-calorie malnutrition, severe ([place]) 10/19/2013   Iron deficiency anemia, multifactorial with GI/GU and menstrual losses contributory 10/18/2013   Diverticulitis of colon (without mention of hemorrhage)(562.11) 10/18/2013   Weight loss 10/17/2013   Altered mental status 08/07/2013   UTI (lower urinary tract infection) 08/07/2013   Hypokalemia 08/07/2013    Anemia 08/07/2013    ONSET DATE: 10/01/21  REFERRING DIAG: G95.9 (ICD-10-CM) - Cervical myelopathy (H[facility])   THERAPY DIAG:  Muscle weakness (generalized)  Unsteadiness on feet  Other abnormalities of gait and mobility  Rationale for Evaluation and Treatment Rehabilitation  PERTINENT HISTORY: C4-6 fusion, hx of colon cancer for which she had surgery with resection of part of colon and part of her bladder.  PRECAUTIONS: None; cervical precautions lifted 9/4  WEIGHT BEARING RESTRICTIONS No  FALLS: Has patient fallen in last 6 months? Yes. Number of falls 2 falls prior to surgery due to legs giving away.  PATIENT GOALS get the feeling back, get the mobility back in my hands and feet                                                                                                                                                                                          SUBJECTIVE STATEMENT: Pt reports feeling more "heavy" and "wobbly" today, may have overdid it physically over the weekend. Pt reports every time she got up to use the bathroom she did her seated stepper (40 steps). Ongoing nerve pain in hands.  Pt accompanied by: self  PAIN:  Are you having pain? Yes: NPRS scale: 4/10 Pain location: bil hands Pain description: pins and needles Aggravating factors: varied movements at random times Relieving factors: pain medication (oxy or tylenol, trying to wean off oxy)     TODAY'S TREATMENT:  THER ACT:  [facility] PT Assessment - 03/30/22 0937       Ambulation/Gait   Gait velocity 32.8 ft over 12 sec = 2.73 ft/sec      Standardized Balance Assessment   Standardized Balance Assessment Five Times Sit to Stand    Five times sit to stand comments  17 no UE      Functional Gait  Assessment   Gait assessed  Yes    Gait Level Surface Walks 20 ft in less than 5.5 sec, no assistive devices, good speed, no evidence for imbalance, normal gait pattern, deviates no more than 6 in outside of  the 12 in walkway width.    Change  in Gait Speed Able to change speed, demonstrates mild gait deviations, deviates 6-10 in outside of the 12 in walkway width, or no gait deviations, unable to achieve a major change in velocity, or uses a change in velocity, or uses an assistive device.    Gait with Horizontal Head Turns Performs head turns with moderate changes in gait velocity, slows down, deviates 10-15 in outside 12 in walkway width but recovers, can continue to walk.    Gait with Vertical Head Turns Performs task with slight change in gait velocity (eg, minor disruption to smooth gait path), deviates 6 - 10 in outside 12 in walkway width or uses assistive device    Gait and Pivot Turn Turns slowly, requires verbal cueing, or requires several small steps to catch balance following turn and stop    Step Over Obstacle Cannot perform without assistance.    Gait with Narrow Base of Support Ambulates less than 4 steps heel to toe or cannot perform without assistance.    Gait with Eyes Closed Walks 20 ft, slow speed, abnormal gait pattern, evidence for imbalance, deviates 10-15 in outside 12 in walkway width. Requires more than 9 sec to ambulate 20 ft.    Ambulating Backwards Walks 20 ft, uses assistive device, slower speed, mild gait deviations, deviates 6-10 in outside 12 in walkway width.    Steps Alternating feet, must use rail.    Total Score 14            mCTSIB: 120/120  NMR: Alt L/R 6" step-taps with progression to 12" step-taps with BUE support -tried BLE 3# ankle weights, exhibits circumduction with RLE so weight removed from this side -2 x 10 reps BLE with ongoing RLE circumduction at times even with removal of weight  In // bars: -forward stepping over 6" hurdles with BUE support, difficulty clearing RLE at times -forward/backward stepping over 4" hurdle with RLE with focus on decreased compensations, x 10 reps, x 5 reps -lateral stepping over 4" hurdles with RLE x 10  reps   PATIENT EDUCATION: Education details: continue HEP Person educated: Patient Education method: Explanation Education comprehension: verbalized understanding   HOME EXERCISE PROGRAM: Walking Program: Walk with your family. Walk during cooler times of the day. Starting next week, Walk 5 min per day for 7 days. Following week add 5 minutes to your total time. Week 1 10 min Week 2: 15 min Week 3: 20 min Week 4: 25 min...Marland KitchenMarland KitchenUntil you can walk to 30 min per day   Practice sit to stand: 2 x 10 without use of your arms on sturdy chair (put chair against table or wall to prevent it from sliding).   Access Code: [email or web address] URL: [email or web address] Date: 02/11/2022 Prepared by: Excell Seltzer  Exercises - Forward Step Up with Counter Support  - 1 x daily - 4-5 x weekly - 3 sets - 10 reps - Lateral Step Up with Counter Support  - 1 x daily - 4-5 x weekly - 3 sets - 10 reps - Standing Tandem Balance with Counter Support  - 1 x daily - 7 x weekly - 2 sets - 5 reps - 30 sec hold - Side Stepping with Resistance at Ankles and Counter Support  - 1 x daily - 7 x weekly - 1 sets - 5 reps - Forward Monster Walk with Resistance at Ankles and Counter Support  - 1 x daily - 7 x weekly - 1 sets - 5 reps - Standing Median Nerve Glide  - 1  x daily - 7 x weekly - 1 sets - 5-10 reps - 10 sec hold - Standing Ulnar Nerve Glide  - 1 x daily - 7 x weekly - 1 sets - 5-10 reps - 10 sec hold  Nerve flossing exercises given with a ball. Drawn picture and gave to patient.   GOALS: Goals reviewed with patient? Yes  SHORT TERM GOALS: Target date: 03/04/2022  Pt will be able to perform 5x sit to stand <30 seconds to improve functional strength without use of UE for support Baseline: 47 sec without use of UE support; 16s no UE  Goal status: MET  2.  Pt will report compliance with walking program and report at least 15 min of walking with st. Cane or no AD to improve walking  endurance Baseline: <5 min with cane (eval); 15 mins with SPC Goal status: MET  3.  Pt will be able to go up and down steps with reciprocal gait pattern with use of 1-2 UE support to improve functional strength in LE Baseline: step to pattern must use bil hand rail (eval); intermittent B UE/U UE reciprocal pattern Goal status: MET   LONG TERM GOALS: Target date: 04/01/2022  Pt will be able to perform 5x sit to stand <20 seconds without use of UE support to improve functionals strength with transfers. Baseline: 47 sec without use of UE (eval), 17 sec (10/2) Goal status: MET  2.  Pt will demo gait speed of >68ms with or without using st. Cane to improve functional ambulation and decrease fall risk Baseline: 0.48 m/s with st. Cane (eval), 0.83 m/s with no AD (10/2) Goal status: IN PROGRESS  3.  Pt will demo >20/30 on FGA to improve functional balance with walking. Baseline: 5/30 (eval), 14/30 (10/2) Goal status: IN PROGRESS  4.  Pt will be able to ambulate for 30 min with or without cane to improve walking endurance for community activities. Baseline: <5 min with cane (eval), per pt report can ambulate at least 30 min (10/2) Goal status: MET  5.  Pt will demo 20 sec improvement on her Modified CTSIB to improve functional standing balance and proprioception. Baseline: 107.3/120 sec (8/16), 120/120 sec (10/2) Goal status: MET   NEW LONG TERM GOALS: Target date: 05/11/2022  1.  Pt will demo gait speed of >179m with or without using st. Cane to improve functional ambulation and decrease fall risk Baseline: 0.48 m/s with st. Cane (eval), 0.83 m/s with no AD (10/2) Goal status: IN PROGRESS  2.  Pt will demo >20/30 on FGA to improve functional balance with walking. Baseline: 5/30 (eval), 14/30 (10/2) Goal status: IN PROGRESS  3.  Pt will be independent with final HEP for improved strength, balance, transfers and gait. Baseline:  Goal status: INITIAL    ASSESSMENT:  CLINICAL  IMPRESSION: Emphasis of skilled PT session on reassing LTG and creating new LTG for recertification of PT POC. Pt has met 3/5 LTG but is making good progress towards all of her goals. Pt has improved her 5xSTS score from 47 sec to 17 sec without use of AD, improved her gait speed from 0.48 m/s to 0.83 m/s, improved her score on the FGA from 5/30 initially to 14/30, improved her mCTSIB score from 107.3 sec to 120/120 sec, and is now able to ambulate up to 30 min at home without her AD. Pt continues to exhibit decreased RLE functional strength including decreased hip flexion during gait leading to gait deviations, decreased ability to step  over obstacles safely without UE support, and increased fall risk as evidenced by decreased gait speed of 0.83 m/s and FGA score of 14/30. Pt continues to benefit from skilled therapy services to address ongoing impairments and decrease fall risk. Continue POC.   OBJECTIVE IMPAIRMENTS Abnormal gait, decreased activity tolerance, decreased balance, decreased endurance, decreased mobility, difficulty walking, decreased ROM, decreased strength, hypomobility, increased fascial restrictions, increased muscle spasms, impaired flexibility, impaired UE functional use, improper body mechanics, postural dysfunction, and pain.   ACTIVITY LIMITATIONS carrying, lifting, bending, standing, squatting, stairs, and transfers  PARTICIPATION LIMITATIONS: meal prep, cleaning, laundry, driving, shopping, community activity, and yard work  PERSONAL FACTORS Past/current experiences, Time since onset of injury/illness/exacerbation, and 1 comorbidity: C4-6 fusion  are also affecting patient's functional outcome.   REHAB POTENTIAL: Good  CLINICAL DECISION MAKING: Stable/uncomplicated  EVALUATION COMPLEXITY: Low  PLAN: PT FREQUENCY: 2x/week  PT DURATION: 6 weeks+6 visits within next 6 weeks (recert)  PLANNED INTERVENTIONS: Therapeutic exercises, Therapeutic activity, Neuromuscular  re-education, Balance training, Gait training, Patient/Family education, Self Care, Joint mobilization, Stair training, Orthotic/Fit training, Dry Needling, Electrical stimulation, Spinal mobilization, Cryotherapy, Moist heat, Manual therapy, and Re-evaluation  PLAN FOR NEXT SESSION:  Continue to work on gait with no device adding in dynamic components, gait outdoors on compliant surfaces with cane vs no device, balance on compliant surfaces progressing to vision removed as sensation continues to return, single leg stance tasks, SciFit vs elliptical, R hip flexor strengthening, stepping over hurdles/beam/obstacles with decreased UE support    Excell Seltzer, PT, DPT, [facility] [address] [place], [place] [address] [phone] 03/30/22, 11:29 AM

## 2022-03-30 NOTE — Patient Instructions (Signed)
Strengthening: Resisted Flexion   Attach tube to door.  Hold tubing with one arm at side. Pull forward and up with elbow straight. Move shoulder through pain-free range of motion, no further than shoulder height. Repeat 10 times per set.  Do 1-2 sessions per day.    Strengthening: Resisted Extension   Attach one end to door.  Hold tubing in one hand, arm forward. Pull arm back, elbow straight. Repeat 10 times per set. Do 1-2 sessions per day.   Resisted Horizontal Abduction: Bilateral   Sit or stand, tubing in both hands, palms down and arms out in front. Keeping arms straight, pinch shoulder blades together and stretch arms out.  PALMS DOWN Repeat 10 times per set.  Do 1-2 sessions per day.     Scapular Retraction: Rowing (Eccentric) - Arms - 45 Degrees (Resistance Band)    Hold end of band in each hand. Pull back until elbows are even with trunk. Keep elbows out from sides at 45, thumbs up. Slowly release for 3-5 seconds. Use yellow resistance band. 10 reps per set, 1-2 sets per day   **REPEAT ALL WITH BOTH ARMS.**

## 2022-04-03 ENCOUNTER — Ambulatory Visit: Payer: Managed Care, Other (non HMO) | Admitting: Physical Therapy

## 2022-04-06 ENCOUNTER — Ambulatory Visit: Payer: Managed Care, Other (non HMO) | Admitting: Occupational Therapy

## 2022-04-10 ENCOUNTER — Ambulatory Visit: Payer: Managed Care, Other (non HMO) | Admitting: Physical Therapy

## 2022-04-10 ENCOUNTER — Telehealth: Payer: Self-pay | Admitting: Physical Therapy

## 2022-04-10 NOTE — Telephone Encounter (Signed)
PT LVM for patient regarding no show/cancel policy and missed appt today.  Informed her of her next upcoming appt on 10/16 with PT and OT.  Elease Etienne, PT, DPT

## 2022-04-13 ENCOUNTER — Ambulatory Visit: Payer: Managed Care, Other (non HMO) | Admitting: Physical Therapy

## 2022-04-13 ENCOUNTER — Ambulatory Visit: Payer: Managed Care, Other (non HMO) | Admitting: Occupational Therapy

## 2022-04-13 ENCOUNTER — Telehealth: Payer: Self-pay | Admitting: Physical Therapy

## 2022-04-16 ENCOUNTER — Encounter: Payer: Managed Care, Other (non HMO) | Admitting: Occupational Therapy

## 2022-04-17 ENCOUNTER — Ambulatory Visit: Payer: Managed Care, Other (non HMO) | Admitting: Physical Therapy

## 2022-04-17 ENCOUNTER — Encounter: Payer: Self-pay | Admitting: Physical Therapy

## 2022-04-17 NOTE — Therapy (Signed)
[facility] [address] [place], [place], [address] Phone: [phone]   Fax:  [phone]  Patient Details  Name: Amy Bentley MRN: [id number] Date of Birth: 12/13/1970 Referring Provider:  No ref. provider found  Encounter Date: 04/17/2022   PHYSICAL THERAPY DISCHARGE SUMMARY  Visits from Start of Care: 11  Current functional level related to goals / functional outcomes: Pt no showed her scheduled PT appointment today, her third no show in a row. Pt was called twice and left a VM to remind her of no show policy and her next scheduled appointment date. Pt is being discharged due to not showing to appointments, unable to assess her current functional level at this time.   Education / Equipment: HEP   Patient agrees to discharge. Patient goals were not met. Patient is being discharged due to not returning since the last visit.   Jannah E Plaster, PT, DPT 04/17/2022, 9:12 AM  [facility] [address] [place], [place], [address] Phone: [phone]   Fax:  [phone] 

## 2022-04-20 ENCOUNTER — Encounter: Payer: Managed Care, Other (non HMO) | Admitting: Occupational Therapy

## 2022-04-20 ENCOUNTER — Ambulatory Visit: Payer: Managed Care, Other (non HMO) | Admitting: Physical Therapy

## 2022-04-22 ENCOUNTER — Encounter: Payer: Managed Care, Other (non HMO) | Admitting: Occupational Therapy

## 2022-04-24 ENCOUNTER — Ambulatory Visit: Payer: Managed Care, Other (non HMO) | Admitting: Physical Therapy

## 2022-04-27 ENCOUNTER — Ambulatory Visit: Payer: Managed Care, Other (non HMO) | Admitting: Physical Therapy

## 2022-04-27 ENCOUNTER — Ambulatory Visit: Payer: Managed Care, Other (non HMO) | Admitting: Occupational Therapy

## 2022-04-27 ENCOUNTER — Encounter: Payer: Managed Care, Other (non HMO) | Admitting: Occupational Therapy

## 2022-04-29 ENCOUNTER — Encounter: Payer: Managed Care, Other (non HMO) | Admitting: Occupational Therapy

## 2022-05-01 ENCOUNTER — Ambulatory Visit: Payer: Managed Care, Other (non HMO) | Admitting: Physical Therapy

## 2022-10-07 DIAGNOSIS — G629 Polyneuropathy, unspecified: Secondary | ICD-10-CM | POA: Insufficient documentation

## 2022-10-07 DIAGNOSIS — M79641 Pain in right hand: Secondary | ICD-10-CM | POA: Insufficient documentation

## 2022-10-08 ENCOUNTER — Other Ambulatory Visit: Payer: Self-pay | Admitting: Nurse Practitioner

## 2022-10-08 DIAGNOSIS — Z1231 Encounter for screening mammogram for malignant neoplasm of breast: Secondary | ICD-10-CM

## 2022-10-21 DIAGNOSIS — E785 Hyperlipidemia, unspecified: Secondary | ICD-10-CM | POA: Insufficient documentation

## 2022-11-11 ENCOUNTER — Ambulatory Visit (AMBULATORY_SURGERY_CENTER): Payer: BC Managed Care – PPO

## 2022-11-11 VITALS — Ht 61.0 in | Wt 206.0 lb

## 2022-11-11 DIAGNOSIS — Z85038 Personal history of other malignant neoplasm of large intestine: Secondary | ICD-10-CM

## 2022-11-11 MED ORDER — NA SULFATE-K SULFATE-MG SULF 17.5-3.13-1.6 GM/177ML PO SOLN: 1.0000 | Freq: Once | ORAL | 0 refills | Status: AC

## 2022-11-11 NOTE — Progress Notes (Signed)
Pre visit completed via phone call; Patient verified name, DOB, and address;  No egg or soy allergy known to patient;  No issues known to pt with past sedation with any surgeries or procedures; Patient denies ever being told they had issues or difficulty with intubation;  No FH of Malignant Hyperthermia; Pt is not on diet pills; Pt is not on home 02;  Pt is not on blood thinners;  Pt reports issues with constipation -takes daily Colace for assistance with constipation; patient advised to start taking Miralax 5-7 days prior to prep starting; as well as being advised to increase oral fluids, activity, and allowed fruits and veggies;  No A fib or A flutter; Have any cardiac testing pending--NO Pt instructed to use [email or web address] or GoodRx for a price reduction on prep   Insurance verified during PV appt=[employer]   Patient's chart reviewed by Amy Bentley CNRA prior to previsit and patient appropriate for the [facility].  Previsit completed and red dot placed by patient's name on their procedure day (on provider's schedule).    Instructions sent via MyChart per patient request;

## 2022-11-26 ENCOUNTER — Encounter: Payer: Self-pay | Admitting: Certified Registered Nurse Anesthetist

## 2022-12-03 ENCOUNTER — Encounter: Payer: Managed Care, Other (non HMO) | Admitting: Gastroenterology

## 2022-12-18 ENCOUNTER — Ambulatory Visit: Payer: BC Managed Care – PPO | Admitting: Gastroenterology

## 2022-12-18 ENCOUNTER — Encounter: Payer: Self-pay | Admitting: Gastroenterology

## 2022-12-18 VITALS — BP 142/98 | HR 54 | Temp 97.5°F | Resp 13 | Ht 61.0 in | Wt 206.0 lb

## 2022-12-18 DIAGNOSIS — Z85038 Personal history of other malignant neoplasm of large intestine: Secondary | ICD-10-CM

## 2022-12-18 DIAGNOSIS — Z08 Encounter for follow-up examination after completed treatment for malignant neoplasm: Secondary | ICD-10-CM

## 2022-12-18 MED ORDER — SODIUM CHLORIDE 0.9 % IV SOLN: 500.0000 mL | Freq: Once | INTRAVENOUS | Status: DC

## 2022-12-18 NOTE — Progress Notes (Signed)
Uneventful anesthetic. Report to pacu rn. Vss. Care resumed by rn. 

## 2022-12-18 NOTE — Progress Notes (Signed)
Pt's states no medical or surgical changes since previsit or office visit. 

## 2022-12-18 NOTE — Progress Notes (Signed)
GASTROENTEROLOGY PROCEDURE H&P NOTE   Primary Care Physician: Diamantina Providence, FNP    Reason for Procedure:   History of colon cancer  Plan:    Colonoscopy  Patient is appropriate for endoscopic procedure(s) in the ambulatory ([facility]) setting.  The nature of the procedure, as well as the risks, benefits, and alternatives were carefully and thoroughly reviewed with the patient. Ample time for discussion and questions allowed. The patient understood, was satisfied, and agreed to proceed.     HPI: Amy Bentley is a 52 y.o. female with a history of Stage IIC (T4b, N0) sigmoid cancer (invading urinary bladder) s/p 09/2013 segmental colon resection and same time bladder surgery; neoadjuvant chemo completed 03/2014; due to complexity of case, mircoperforation she was not able to undergo pre-op colonoscopy.  Colonoscopy 07/2014 normal colo-colonic anastomosis at 25cm. Last colonoscopy was 11/2017 and again notable for normal colo-colonic anastamosis at 25 cm and otherwise normal appearing colon. She presents today for ongoing surveillance. No recent lower GI symptoms.    Past Medical History:  Diagnosis Date   Colon cancer (HCC) 2015   GERD (gastroesophageal reflux disease)    Hyperlipidemia    on meds   Morbid obesity [facility])     Past Surgical History:  Procedure Laterality Date   ANTERIOR CERVICAL DECOMP/DISCECTOMY FUSION N/A 01/30/2022   Procedure: Anterior Cervical Decompression/Discectomy Fusion Cervical Four-Five, Cervical Five-Six;  Surgeon: Bethann Goo, DO;  Location: [facility] OR;  Service: Neurosurgery;  Laterality: N/A;  Anterior Cervical Decompression/Discectomy Fusion Cervical Four-Five, Cervical Five-Six   BREAST BIOPSY Right    stereo    CESAREAN SECTION     x 2   COLON RESECTION N/A 10/20/2013   Procedure: OPEN SIGMOID COLONECTOMY COLOVESICAL FISTULA REPAIR;  Surgeon: Almond Lint, MD;  Location: [facility];  Service: General;  Laterality: N/A;   COLONOSCOPY   08/16/2014   COLONOSCOPY WITH PROPOFOL N/A 08/16/2014   Procedure: COLONOSCOPY WITH PROPOFOL;  Surgeon: Rachael Fee, MD;  Location: [facility];  Service: Endoscopy;  Laterality: N/A;   COLONOSCOPY WITH PROPOFOL N/A 12/23/2017   Procedure: COLONOSCOPY WITH PROPOFOL;  Surgeon: Rachael Fee, MD;  Location: [facility];  Service: Endoscopy;  Laterality: N/A;   CYSTECTOMY N/A 10/20/2013   Procedure: CYSTECTOMY PARTIAL;  Surgeon: Crist Fat, MD;  Location: [facility];  Service: Urology;  Laterality: N/A;   HERNIA REPAIR     LAPAROSCOPIC GASTRIC SLEEVE RESECTION N/A 12/04/2019   Procedure: LAPAROSCOPIC GASTRIC SLEEVE RESECTION, Upper Endo, Eras Pathway;  Surgeon: Berna Bue, MD;  Location: [facility];  Service: General;  Laterality: N/A;   PORTACATH PLACEMENT N/A 11/15/2013   Procedure: INSERTION PORT-A-CATH;  Surgeon: Almond Lint, MD;  Location: [facility];  Service: General;  Laterality: N/A;   TUBAL LIGATION     UPPER GI ENDOSCOPY N/A 12/04/2019   Procedure: UPPER GI ENDOSCOPY;  Surgeon: Berna Bue, MD;  Location: [facility];  Service: General;  Laterality: N/A;   VENTRAL HERNIA REPAIR N/A 07/21/2017   Procedure: EXPLORATORY LAPAROTOMY AND LYSIS OF ADHESIONS WITH  REPAIR OF VENTRAL HERNIA WITH MESH ;  Surgeon: Glenna Fellows, MD;  Location: [facility];  Service: General;  Laterality: N/A;    Prior to Admission medications   Medication Sig Start Date End Date Taking? Authorizing Provider  atorvastatin (LIPITOR) 20 MG tablet Take 20 mg by mouth daily. 10/21/22   [provider]  Baclofen 5 MG TABS Take 1 tablet by mouth 3 (three) times daily as needed (msucle spasms).  [provider]  docusate sodium (COLACE) 100 MG capsule Take 1 capsule (100 mg total) by mouth 2 (two) times daily. 01/31/22   Tressie Stalker, MD  oxyCODONE-acetaminophen (PERCOCET) 5-325 MG tablet Take 1 tablet by mouth every 4 (four) hours as needed for severe pain. 01/31/22 01/31/23  Tressie Stalker, MD   phentermine (ADIPEX-P) 37.5 MG tablet Take 0.5 tablets by mouth daily before breakfast. 10/21/22   [provider]  pregabalin (LYRICA) 75 MG capsule Take 75 mg by mouth 3 (three) times daily.    [provider]    Current Outpatient Medications  Medication Sig Dispense Refill   atorvastatin (LIPITOR) 20 MG tablet Take 20 mg by mouth daily.     Baclofen 5 MG TABS Take 1 tablet by mouth 3 (three) times daily as needed (msucle spasms).     docusate sodium (COLACE) 100 MG capsule Take 1 capsule (100 mg total) by mouth 2 (two) times daily. 30 capsule 0   oxyCODONE-acetaminophen (PERCOCET) 5-325 MG tablet Take 1 tablet by mouth every 4 (four) hours as needed for severe pain. 20 tablet 0   phentermine (ADIPEX-P) 37.5 MG tablet Take 0.5 tablets by mouth daily before breakfast.     pregabalin (LYRICA) 75 MG capsule Take 75 mg by mouth 3 (three) times daily.     No current facility-administered medications for this visit.    Allergies as of 12/18/2022 - Review Complete 11/26/2022  Allergen Reaction Noted   Bactrim [sulfamethoxazole-trimethoprim] Other (See Comments) 10/17/2013    Family History  Problem Relation Age of Onset   Prostate cancer Father    Cancer Paternal Aunt 81       colon   Colon cancer Paternal Aunt 25   Hypertension Other    Diabetes Other    Esophageal cancer Neg Hx    Rectal cancer Neg Hx    Stomach cancer Neg Hx    Breast cancer Neg Hx    Colon polyps Neg Hx     Social History   Socioeconomic History   Marital status: Widowed    Spouse name: Not on file   Number of children: Not on file   Years of education: Not on file   Highest education level: Not on file  Occupational History   Not on file  Tobacco Use   Smoking status: Former    Packs/day: .5    Types: Cigarettes    Quit date: 10/17/2013    Years since quitting: 9.1   Smokeless tobacco: Never  Vaping Use   Vaping Use: Never used  Substance and Sexual Activity   Alcohol use:  Not Currently   Drug use: Yes    Frequency: 3.0 times per week    Types: Marijuana    Comment: at least once a week (last use 01/28/22 as of 01/29/22 stopped d/t upcoming surgery))   Sexual activity: Never  Other Topics Concern   Not on file  Social History Narrative   Not on file   Social Determinants of Health   Financial Resource Strain: Not on file  Food Insecurity: Not on file  Transportation Needs: Not on file  Physical Activity: Not on file  Stress: Not on file  Social Connections: Not on file  Intimate Partner Violence: Not on file    Physical Exam: Vital signs in last 24 hours: @There  were no vitals taken for this visit. GEN: NAD EYE: Sclerae anicteric ENT: MMM CV: Non-tachycardic Pulm: CTA b/l GI: Soft, NT/ND NEURO:  Alert & Oriented x  3   Doristine Locks, [facility]   12/18/2022 10:40 AM

## 2022-12-18 NOTE — Patient Instructions (Signed)
Please read handouts provided. Continue present medications. Repeat colonoscopy in 5 years for screening. Return to GI office as needed.   YOU HAD AN ENDOSCOPIC PROCEDURE TODAY AT THE [facility]:   Refer to the procedure report that was given to you for any specific questions about what was found during the examination.  If the procedure report does not answer your questions, please call your gastroenterologist to clarify.  If you requested that your care partner not be given the details of your procedure findings, then the procedure report has been included in a sealed envelope for you to review at your convenience later.  YOU SHOULD EXPECT: Some feelings of bloating in the abdomen. Passage of more gas than usual.  Walking can help get rid of the air that was put into your GI tract during the procedure and reduce the bloating. If you had a lower endoscopy (such as a colonoscopy or flexible sigmoidoscopy) you may notice spotting of blood in your stool or on the toilet paper. If you underwent a bowel prep for your procedure, you may not have a normal bowel movement for a few days.  Please Note:  You might notice some irritation and congestion in your nose or some drainage.  This is from the oxygen used during your procedure.  There is no need for concern and it should clear up in a day or so.  SYMPTOMS TO REPORT IMMEDIATELY:  Following lower endoscopy (colonoscopy or flexible sigmoidoscopy):  Excessive amounts of blood in the stool  Significant tenderness or worsening of abdominal pains  Swelling of the abdomen that is new, acute  Fever of 100F or higher  For urgent or emergent issues, a gastroenterologist can be reached at any hour by calling [phone]. Do not use MyChart messaging for urgent concerns.    DIET:  We do recommend a small meal at first, but then you may proceed to your regular diet.  Drink plenty of fluids but you should avoid alcoholic beverages for 24  hours.  ACTIVITY:  You should plan to take it easy for the rest of today and you should NOT DRIVE or use heavy machinery until tomorrow (because of the sedation medicines used during the test).    FOLLOW UP: Our staff will call the number listed on your records the next business day following your procedure.  We will call around 7:15- 8:00 am to check on you and address any questions or concerns that you may have regarding the information given to you following your procedure. If we do not reach you, we will leave a message.     If any biopsies were taken you will be contacted by phone or by letter within the next 1-3 weeks.  Please call us at [phone] if you have not heard about the biopsies in 3 weeks.    SIGNATURES/CONFIDENTIALITY: You and/or your care partner have signed paperwork which will be entered into your electronic medical record.  These signatures attest to the fact that that the information above on your After Visit Summary has been reviewed and is understood.  Full responsibility of the confidentiality of this discharge information lies with you and/or your care-partner.

## 2022-12-18 NOTE — Op Note (Signed)
[facility] Patient Name: Amy Bentley Procedure Date: 12/18/2022 11:10 AM MRN: [id number] Endoscopist: Doristine Locks , MD, [id number] Age: 52 Referring MD:  Date of Birth: May 04, 1971 Gender: Female Account #: [id number] Procedure:                Colonoscopy Indications:              High risk colon cancer surveillance: Personal                            history of colon cancer                           52 y.o. female with a history of Stage IIC (T4b,                            N0) sigmoid cancer (invading urinary bladder) s/p                            09/2013 segmental colon resection and same time                            bladder surgery; neoadjuvant chemo completed                            03/2014; due to complexity of case,                            microperforation she was not able to undergo pre-op                            colonoscopy. Colonoscopy 07/2014 normal colo-colonic                            anastomosis at 25cm. Last colonoscopy was 11/2017                            and again notable for normal colo-colonic                            anastamosis at 25 cm and otherwise normal appearing                            colon. She presents today for ongoing surveillance.                            No recent lower GI symptoms. Medicines:                Monitored Anesthesia Care Procedure:                Pre-Anesthesia Assessment:                           - Prior to the procedure, a History and Physical  was performed, and patient medications and                            allergies were reviewed. The patient's tolerance of                            previous anesthesia was also reviewed. The risks                            and benefits of the procedure and the sedation                            options and risks were discussed with the patient.                            All questions were answered, and informed consent                             was obtained. Prior Anticoagulants: The patient has                            taken no anticoagulant or antiplatelet agents. ASA                            Grade Assessment: II - A patient with mild systemic                            disease. After reviewing the risks and benefits,                            the patient was deemed in satisfactory condition to                            undergo the procedure.                           After obtaining informed consent, the colonoscope                            was passed under direct vision. Throughout the                            procedure, the patient's blood pressure, pulse, and                            oxygen saturations were monitored continuously. The                            CF [id number] #[id number] was introduced through the anus                            and advanced to the the terminal ileum. The  colonoscopy was performed without difficulty. The                            patient tolerated the procedure well. The quality                            of the bowel preparation was excellent. The                            terminal ileum, ileocecal valve, appendiceal                            orifice, and rectum were photographed. Scope In: 11:14:14 AM Scope Out: 11:22:44 AM Scope Withdrawal Time: 0 hours 6 minutes 41 seconds  Total Procedure Duration: 0 hours 8 minutes 30 seconds  Findings:                 The perianal and digital rectal examinations were                            normal.                           There was evidence of a prior end-to-end                            colo-colonic anastomosis at 25 cm from the anal                            verge. This was patent and was characterized by                            healthy appearing mucosa. The anastomosis was                            traversed.                           The remainder of the colon appeared normal.                            The retroflexed view of the distal rectum and anal                            verge was normal and showed no anal or rectal                            abnormalities.                           The terminal ileum appeared normal. Complications:            No immediate complications. Estimated Blood Loss:     Estimated blood loss: none. Impression:               - Patent end-to-end colo-colonic anastomosis,  characterized by healthy appearing mucosa.                           - The entire examined colon is normal.                           - The distal rectum and anal verge are normal on                            retroflexion view.                           - The examined portion of the ileum was normal.                           - No specimens collected. Recommendation:           - Patient has a contact number available for                            emergencies. The signs and symptoms of potential                            delayed complications were discussed with the                            patient. Return to normal activities tomorrow.                            Written discharge instructions were provided to the                            patient.                           - Resume previous diet.                           - Continue present medications.                           - Repeat colonoscopy in 5 years for surveillance.                           - Return to GI office PRN. Doristine Locks, MD 12/18/2022 11:28:09 AM

## 2022-12-22 ENCOUNTER — Telehealth: Payer: Self-pay

## 2022-12-22 NOTE — Telephone Encounter (Signed)
  Follow up Call-     12/18/2022   10:55 AM  Call back number  Post procedure Call Back phone  # [phone]  Permission to leave phone message Yes     Patient questions:  Do you have a fever, pain , or abdominal swelling? No. Pain Score  0 *  Have you tolerated food without any problems? Yes.    Have you been able to return to your normal activities? Yes.    Do you have any questions about your discharge instructions: Diet   No. Medications  No. Follow up visit  No.  Do you have questions or concerns about your Care? No.  Actions: * If pain score is 4 or above: No action needed, pain <4.

## 2023-07-05 ENCOUNTER — Encounter (HOSPITAL_COMMUNITY): Payer: Self-pay | Admitting: *Deleted

## 2023-10-25 ENCOUNTER — Other Ambulatory Visit: Payer: Self-pay | Admitting: Nurse Practitioner

## 2023-10-25 DIAGNOSIS — Z1231 Encounter for screening mammogram for malignant neoplasm of breast: Secondary | ICD-10-CM

## 2023-11-03 ENCOUNTER — Ambulatory Visit: Payer: Self-pay

## 2023-12-14 ENCOUNTER — Ambulatory Visit: Payer: Self-pay

## 2024-02-15 ENCOUNTER — Other Ambulatory Visit: Payer: Self-pay | Admitting: Nurse Practitioner

## 2024-02-15 DIAGNOSIS — Z1231 Encounter for screening mammogram for malignant neoplasm of breast: Secondary | ICD-10-CM

## 2024-02-16 ENCOUNTER — Ambulatory Visit: Payer: Self-pay

## 2024-03-01 ENCOUNTER — Encounter

## 2024-03-01 DIAGNOSIS — Z1231 Encounter for screening mammogram for malignant neoplasm of breast: Secondary | ICD-10-CM

## 2024-07-07 ENCOUNTER — Encounter (HOSPITAL_COMMUNITY): Payer: Self-pay | Admitting: *Deleted
